# Patient Record
Sex: Male | Born: 1950 | Race: White | Hispanic: No | Marital: Married | Smoking: Never smoker
Health system: Southern US, Community
[De-identification: ages and names within clinical notes are randomized; demographics above are authoritative.]

## PROBLEM LIST (undated history)

## (undated) DIAGNOSIS — C801 Malignant (primary) neoplasm, unspecified: Secondary | ICD-10-CM

## (undated) DIAGNOSIS — R0600 Dyspnea, unspecified: Secondary | ICD-10-CM

## (undated) DIAGNOSIS — J45909 Unspecified asthma, uncomplicated: Secondary | ICD-10-CM

## (undated) DIAGNOSIS — I10 Essential (primary) hypertension: Secondary | ICD-10-CM

## (undated) DIAGNOSIS — R490 Dysphonia: Secondary | ICD-10-CM

## (undated) DIAGNOSIS — J189 Pneumonia, unspecified organism: Secondary | ICD-10-CM

## (undated) DIAGNOSIS — E785 Hyperlipidemia, unspecified: Secondary | ICD-10-CM

## (undated) DIAGNOSIS — I2510 Atherosclerotic heart disease of native coronary artery without angina pectoris: Secondary | ICD-10-CM

## (undated) DIAGNOSIS — K219 Gastro-esophageal reflux disease without esophagitis: Secondary | ICD-10-CM

## (undated) HISTORY — PX: CATARACT EXTRACTION: SUR2

## (undated) HISTORY — DX: Hyperlipidemia, unspecified: E78.5

## (undated) HISTORY — PX: UPPER GASTROINTESTINAL ENDOSCOPY: SHX188

## (undated) HISTORY — PX: PROSTATE BIOPSY: SHX241

## (undated) HISTORY — DX: Essential (primary) hypertension: I10

## (undated) HISTORY — DX: Unspecified asthma, uncomplicated: J45.909

---

## 2007-06-16 ENCOUNTER — Encounter: Admission: RE | Admit: 2007-06-16 | Discharge: 2007-06-16 | Payer: Self-pay | Admitting: Family Medicine

## 2007-06-27 ENCOUNTER — Encounter: Admission: RE | Admit: 2007-06-27 | Discharge: 2007-06-27 | Payer: Self-pay | Admitting: Family Medicine

## 2012-08-16 ENCOUNTER — Other Ambulatory Visit: Payer: Self-pay | Admitting: Family Medicine

## 2012-08-16 ENCOUNTER — Ambulatory Visit
Admission: RE | Admit: 2012-08-16 | Discharge: 2012-08-16 | Disposition: A | Discharge status: Home / Self Care | Payer: BC Managed Care – PPO | Source: Ambulatory Visit | Attending: Family Medicine | Admitting: Family Medicine

## 2012-08-16 DIAGNOSIS — R609 Edema, unspecified: Secondary | ICD-10-CM

## 2013-07-11 ENCOUNTER — Telehealth: Payer: Self-pay | Admitting: Neurology

## 2013-07-14 NOTE — Telephone Encounter (Signed)
Answered patient's questions about upcoming NCV/EMG.

## 2013-07-17 ENCOUNTER — Ambulatory Visit (INDEPENDENT_AMBULATORY_CARE_PROVIDER_SITE_OTHER): Payer: BC Managed Care – PPO | Admitting: Neurology

## 2013-07-17 ENCOUNTER — Ambulatory Visit (INDEPENDENT_AMBULATORY_CARE_PROVIDER_SITE_OTHER): Payer: Self-pay | Admitting: Radiology

## 2013-07-17 DIAGNOSIS — R209 Unspecified disturbances of skin sensation: Secondary | ICD-10-CM

## 2013-07-17 DIAGNOSIS — Z0289 Encounter for other administrative examinations: Secondary | ICD-10-CM

## 2013-07-17 DIAGNOSIS — G544 Lumbosacral root disorders, not elsewhere classified: Secondary | ICD-10-CM

## 2013-07-17 NOTE — Procedures (Signed)
  HISTORY:  Gilbert Thompson is a 62 year old gentleman with a one-year history of bilateral lower extremity stiffness and a gait disorder. The patient has some weakness of the legs, without problems controlling the bowels or the bladder. The patient is being evaluated for the progressive change in his walking.  NERVE CONDUCTION STUDIES:  Nerve conduction studies were performed on the left upper extremity. The distal motor latencies and motor amplitudes for the median and ulnar nerves were within normal limits. The F wave latencies and nerve conduction velocities for these nerves were also normal. The sensory latencies for the median and ulnar nerves were normal.  Nerve conduction studies were performed on both lower extremities. The distal motor latencies and motor amplitudes for the peroneal and posterior tibial nerves were within normal limits. The nerve conduction velocities for these nerves were also normal. The H reflex latencies were prolonged bilaterally. The sensory latencies for the peroneal nerves were within normal limits.   EMG STUDIES:  EMG study was performed on the right lower extremity:  The tibialis anterior muscle reveals 2 to 5K motor units with decreased recruitment. No fibrillations or positive waves were seen. The peroneus tertius muscle reveals 4 to 10K motor units with decreased recruitment. No fibrillations or positive waves were seen. The medial gastrocnemius muscle reveals 1 to 3K motor units with decreased recruitment. One plus fibrillations and positive waves were seen. The vastus lateralis muscle reveals 2 to 4K motor units with full recruitment. No fibrillations or positive waves were seen. The iliopsoas muscle reveals 2 to 4K motor units with full recruitment. No fibrillations or positive waves were seen. The biceps femoris muscle (long head) reveals 2 to 4K motor units with full recruitment. No fibrillations or positive waves were seen. The lumbosacral paraspinal  muscles were tested at 3 levels, and revealed no abnormalities of insertional activity at all 3 levels tested. There was good relaxation.  EMG study was performed on the left lower extremity:  The tibialis anterior muscle reveals 2 to 6K motor units with decreased recruitment. No fibrillations or positive waves were seen. The peroneus tertius muscle reveals 2 to 7K motor units with decreased recruitment. No fibrillations or positive waves were seen. The medial gastrocnemius muscle reveals 2 to 4K motor units with decreased recruitment. One plus fibrillations and positive waves were seen. The vastus lateralis muscle reveals 2 to 4K motor units with full recruitment. No fibrillations or positive waves were seen. The iliopsoas muscle reveals 2 to 4K motor units with full recruitment. No fibrillations or positive waves were seen. The biceps femoris muscle (long head) reveals 2 to 4K motor units with full recruitment. No fibrillations or positive waves were seen. The lumbosacral paraspinal muscles were tested at 3 levels, and revealed no abnormalities of insertional activity at all 3 levels tested. There was good relaxation.   IMPRESSION:  Nerve conduction studies done on the left upper extremity and on both lower extremities were unremarkable. No evidence of a peripheral neuropathy is seen. The H reflex latencies were prolonged bilaterally. 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 Clinical correlation is required. Prolongation of the H reflex latencies would be consistent with S1 radiculopathies as well. The possibility of a length dependent primarily motor neuropathy should also be considered.  Marlan Palau MD 07/17/2013 2:05 PM  [facility] [address] [place], [place] [address]  Phone [phone] Fax [phone]

## 2013-07-19 ENCOUNTER — Other Ambulatory Visit: Payer: Self-pay | Admitting: Family Medicine

## 2013-07-19 DIAGNOSIS — M5416 Radiculopathy, lumbar region: Secondary | ICD-10-CM

## 2013-07-22 ENCOUNTER — Other Ambulatory Visit: Payer: BC Managed Care – PPO

## 2013-07-26 ENCOUNTER — Ambulatory Visit
Admission: RE | Admit: 2013-07-26 | Discharge: 2013-07-26 | Disposition: A | Discharge status: Home / Self Care | Payer: BC Managed Care – PPO | Source: Ambulatory Visit | Attending: Family Medicine | Admitting: Family Medicine

## 2013-07-26 DIAGNOSIS — M5416 Radiculopathy, lumbar region: Secondary | ICD-10-CM

## 2013-08-07 ENCOUNTER — Other Ambulatory Visit: Payer: Self-pay | Admitting: Family Medicine

## 2013-08-07 DIAGNOSIS — M5412 Radiculopathy, cervical region: Secondary | ICD-10-CM

## 2013-08-07 DIAGNOSIS — M25512 Pain in left shoulder: Secondary | ICD-10-CM

## 2013-08-15 ENCOUNTER — Other Ambulatory Visit: Payer: Self-pay | Admitting: Family Medicine

## 2013-08-15 ENCOUNTER — Other Ambulatory Visit: Payer: BC Managed Care – PPO

## 2013-08-15 ENCOUNTER — Ambulatory Visit
Admission: RE | Admit: 2013-08-15 | Discharge: 2013-08-15 | Disposition: A | Discharge status: Home / Self Care | Payer: BC Managed Care – PPO | Source: Ambulatory Visit | Attending: Family Medicine | Admitting: Family Medicine

## 2013-08-15 DIAGNOSIS — M25512 Pain in left shoulder: Secondary | ICD-10-CM

## 2013-08-21 ENCOUNTER — Ambulatory Visit
Admission: RE | Admit: 2013-08-21 | Discharge: 2013-08-21 | Disposition: A | Discharge status: Home / Self Care | Payer: BC Managed Care – PPO | Source: Ambulatory Visit | Attending: Family Medicine | Admitting: Family Medicine

## 2013-08-21 DIAGNOSIS — M5412 Radiculopathy, cervical region: Secondary | ICD-10-CM

## 2013-08-21 DIAGNOSIS — M25512 Pain in left shoulder: Secondary | ICD-10-CM

## 2014-05-07 ENCOUNTER — Other Ambulatory Visit: Payer: Self-pay | Admitting: Family Medicine

## 2014-05-07 DIAGNOSIS — M5412 Radiculopathy, cervical region: Secondary | ICD-10-CM

## 2014-05-07 DIAGNOSIS — M5416 Radiculopathy, lumbar region: Secondary | ICD-10-CM

## 2014-05-09 ENCOUNTER — Ambulatory Visit
Admission: RE | Admit: 2014-05-09 | Discharge: 2014-05-09 | Disposition: A | Discharge status: Home / Self Care | Payer: BC Managed Care – PPO | Source: Ambulatory Visit | Attending: Family Medicine | Admitting: Family Medicine

## 2014-05-09 DIAGNOSIS — M5416 Radiculopathy, lumbar region: Secondary | ICD-10-CM

## 2014-05-09 DIAGNOSIS — M5412 Radiculopathy, cervical region: Secondary | ICD-10-CM

## 2014-05-09 MED ORDER — IOHEXOL 180 MG/ML  SOLN
1.0000 mL | Freq: Once | INTRAMUSCULAR | Status: AC | PRN
  Administered 2014-05-09: 1 mL via EPIDURAL

## 2014-05-09 MED ORDER — METHYLPREDNISOLONE ACETATE 40 MG/ML INJ SUSP (RADIOLOG
120.0000 mg | Freq: Once | INTRAMUSCULAR | Status: AC
  Administered 2014-05-09: 120 mg via EPIDURAL

## 2014-05-09 NOTE — Discharge Instructions (Signed)

## 2014-05-15 ENCOUNTER — Other Ambulatory Visit: Payer: Self-pay | Admitting: Family Medicine

## 2014-05-15 DIAGNOSIS — M542 Cervicalgia: Secondary | ICD-10-CM

## 2014-05-23 ENCOUNTER — Other Ambulatory Visit: Payer: Self-pay | Admitting: Family Medicine

## 2014-05-23 DIAGNOSIS — M542 Cervicalgia: Secondary | ICD-10-CM

## 2014-05-25 ENCOUNTER — Ambulatory Visit
Admission: RE | Admit: 2014-05-25 | Discharge: 2014-05-25 | Disposition: A | Discharge status: Home / Self Care | Payer: BC Managed Care – PPO | Source: Ambulatory Visit | Attending: Family Medicine | Admitting: Family Medicine

## 2014-05-25 DIAGNOSIS — M542 Cervicalgia: Secondary | ICD-10-CM

## 2014-05-25 MED ORDER — TRIAMCINOLONE ACETONIDE 40 MG/ML IJ SUSP (RADIOLOGY)
60.0000 mg | Freq: Once | INTRAMUSCULAR | Status: AC
  Administered 2014-05-25: 60 mg via EPIDURAL

## 2014-05-25 MED ORDER — IOHEXOL 300 MG/ML  SOLN
1.0000 mL | Freq: Once | INTRAMUSCULAR | Status: AC | PRN
  Administered 2014-05-25: 1 mL via EPIDURAL

## 2014-06-15 ENCOUNTER — Ambulatory Visit
Admission: RE | Admit: 2014-06-15 | Discharge: 2014-06-15 | Disposition: A | Discharge status: Home / Self Care | Payer: BC Managed Care – PPO | Source: Ambulatory Visit | Attending: Family Medicine | Admitting: Family Medicine

## 2014-06-15 DIAGNOSIS — M542 Cervicalgia: Secondary | ICD-10-CM

## 2014-06-15 MED ORDER — IOHEXOL 300 MG/ML  SOLN
1.0000 mL | Freq: Once | INTRAMUSCULAR | Status: AC | PRN
  Administered 2014-06-15: 1 mL via EPIDURAL

## 2014-06-15 MED ORDER — TRIAMCINOLONE ACETONIDE 40 MG/ML IJ SUSP (RADIOLOGY)
60.0000 mg | Freq: Once | INTRAMUSCULAR | Status: AC
  Administered 2014-06-15: 60 mg via EPIDURAL

## 2014-08-08 ENCOUNTER — Ambulatory Visit
Admission: RE | Admit: 2014-08-08 | Discharge: 2014-08-08 | Disposition: A | Discharge status: Home / Self Care | Payer: BC Managed Care – PPO | Source: Ambulatory Visit | Attending: Family Medicine | Admitting: Family Medicine

## 2014-08-08 ENCOUNTER — Other Ambulatory Visit: Payer: Self-pay | Admitting: Family Medicine

## 2014-08-08 DIAGNOSIS — R059 Cough, unspecified: Secondary | ICD-10-CM

## 2014-08-08 DIAGNOSIS — R05 Cough: Secondary | ICD-10-CM

## 2014-10-18 ENCOUNTER — Other Ambulatory Visit: Payer: Self-pay | Admitting: Family Medicine

## 2014-10-18 DIAGNOSIS — M5416 Radiculopathy, lumbar region: Secondary | ICD-10-CM

## 2014-10-30 ENCOUNTER — Other Ambulatory Visit: Payer: Self-pay | Admitting: Family Medicine

## 2014-10-30 ENCOUNTER — Ambulatory Visit
Admission: RE | Admit: 2014-10-30 | Discharge: 2014-10-30 | Disposition: A | Discharge status: Home / Self Care | Payer: Medicare Other | Source: Ambulatory Visit | Attending: Family Medicine | Admitting: Family Medicine

## 2014-10-30 DIAGNOSIS — M5416 Radiculopathy, lumbar region: Secondary | ICD-10-CM

## 2014-10-30 MED ORDER — METHYLPREDNISOLONE ACETATE 40 MG/ML INJ SUSP (RADIOLOG: 120.0000 mg | Freq: Once | INTRAMUSCULAR | Status: DC

## 2014-10-30 MED ORDER — IOHEXOL 180 MG/ML  SOLN: 1.0000 mL | Freq: Once | INTRAMUSCULAR | Status: AC | PRN

## 2014-11-23 ENCOUNTER — Other Ambulatory Visit: Payer: Self-pay | Admitting: Family Medicine

## 2014-11-23 DIAGNOSIS — M79604 Pain in right leg: Secondary | ICD-10-CM

## 2014-11-23 DIAGNOSIS — M5416 Radiculopathy, lumbar region: Secondary | ICD-10-CM

## 2014-12-04 ENCOUNTER — Ambulatory Visit
Admission: RE | Admit: 2014-12-04 | Discharge: 2014-12-04 | Disposition: A | Discharge status: Home / Self Care | Payer: Medicare Other | Source: Ambulatory Visit | Attending: Family Medicine | Admitting: Family Medicine

## 2014-12-04 DIAGNOSIS — M5416 Radiculopathy, lumbar region: Secondary | ICD-10-CM

## 2014-12-04 DIAGNOSIS — M79604 Pain in right leg: Secondary | ICD-10-CM

## 2014-12-04 MED ORDER — METHYLPREDNISOLONE ACETATE 40 MG/ML INJ SUSP (RADIOLOG
120.0000 mg | Freq: Once | INTRAMUSCULAR | Status: AC
  Administered 2014-12-04: 120 mg via EPIDURAL

## 2014-12-04 MED ORDER — IOHEXOL 180 MG/ML  SOLN
1.0000 mL | Freq: Once | INTRAMUSCULAR | Status: AC | PRN
  Administered 2014-12-04: 1 mL via EPIDURAL

## 2015-03-04 ENCOUNTER — Other Ambulatory Visit: Payer: Self-pay | Admitting: Family Medicine

## 2015-03-04 DIAGNOSIS — M5412 Radiculopathy, cervical region: Secondary | ICD-10-CM

## 2015-03-13 ENCOUNTER — Ambulatory Visit
Admission: RE | Admit: 2015-03-13 | Discharge: 2015-03-13 | Disposition: A | Discharge status: Home / Self Care | Payer: Medicare Other | Source: Ambulatory Visit | Attending: Family Medicine | Admitting: Family Medicine

## 2015-03-13 DIAGNOSIS — M5412 Radiculopathy, cervical region: Secondary | ICD-10-CM

## 2015-03-13 MED ORDER — IOHEXOL 300 MG/ML  SOLN
1.0000 mL | Freq: Once | INTRAMUSCULAR | Status: AC | PRN
  Administered 2015-03-13: 1 mL via EPIDURAL

## 2015-03-13 MED ORDER — TRIAMCINOLONE ACETONIDE 40 MG/ML IJ SUSP (RADIOLOGY)
60.0000 mg | Freq: Once | INTRAMUSCULAR | Status: AC
  Administered 2015-03-13: 60 mg via EPIDURAL

## 2015-05-02 ENCOUNTER — Other Ambulatory Visit: Payer: Self-pay | Admitting: Family Medicine

## 2015-05-02 DIAGNOSIS — M5416 Radiculopathy, lumbar region: Secondary | ICD-10-CM

## 2015-05-10 ENCOUNTER — Ambulatory Visit
Admission: RE | Admit: 2015-05-10 | Discharge: 2015-05-10 | Disposition: A | Discharge status: Home / Self Care | Payer: Medicare Other | Source: Ambulatory Visit | Attending: Family Medicine | Admitting: Family Medicine

## 2015-05-10 DIAGNOSIS — M5416 Radiculopathy, lumbar region: Secondary | ICD-10-CM

## 2015-05-10 MED ORDER — IOHEXOL 180 MG/ML  SOLN
1.0000 mL | Freq: Once | INTRAMUSCULAR | Status: DC | PRN
  Administered 2015-05-10: 1 mL via EPIDURAL

## 2015-05-10 MED ORDER — METHYLPREDNISOLONE ACETATE 40 MG/ML INJ SUSP (RADIOLOG
120.0000 mg | Freq: Once | INTRAMUSCULAR | Status: AC
  Administered 2015-05-10: 120 mg via EPIDURAL

## 2015-10-17 ENCOUNTER — Other Ambulatory Visit: Payer: Self-pay | Admitting: Family Medicine

## 2015-10-17 DIAGNOSIS — M5416 Radiculopathy, lumbar region: Secondary | ICD-10-CM

## 2015-11-01 ENCOUNTER — Ambulatory Visit
Admission: RE | Admit: 2015-11-01 | Discharge: 2015-11-01 | Disposition: A | Discharge status: Home / Self Care | Payer: Medicare Other | Source: Ambulatory Visit | Attending: Family Medicine | Admitting: Family Medicine

## 2015-11-01 DIAGNOSIS — M5416 Radiculopathy, lumbar region: Secondary | ICD-10-CM

## 2015-11-01 MED ORDER — METHYLPREDNISOLONE ACETATE 40 MG/ML INJ SUSP (RADIOLOG
120.0000 mg | Freq: Once | INTRAMUSCULAR | Status: AC
  Administered 2015-11-01: 120 mg via EPIDURAL

## 2015-11-01 MED ORDER — IOHEXOL 180 MG/ML  SOLN
1.0000 mL | Freq: Once | INTRAMUSCULAR | Status: AC | PRN
  Administered 2015-11-01: 1 mL via EPIDURAL

## 2015-11-07 ENCOUNTER — Other Ambulatory Visit: Payer: Self-pay | Admitting: Family Medicine

## 2015-11-07 DIAGNOSIS — M5412 Radiculopathy, cervical region: Secondary | ICD-10-CM

## 2015-11-20 ENCOUNTER — Ambulatory Visit
Admission: RE | Admit: 2015-11-20 | Discharge: 2015-11-20 | Disposition: A | Discharge status: Home / Self Care | Payer: Medicare Other | Source: Ambulatory Visit | Attending: Family Medicine | Admitting: Family Medicine

## 2015-11-20 DIAGNOSIS — M5412 Radiculopathy, cervical region: Secondary | ICD-10-CM

## 2015-11-20 MED ORDER — TRIAMCINOLONE ACETONIDE 40 MG/ML IJ SUSP (RADIOLOGY)
60.0000 mg | Freq: Once | INTRAMUSCULAR | Status: AC
  Administered 2015-11-20: 60 mg via EPIDURAL

## 2015-11-20 MED ORDER — IOHEXOL 300 MG/ML  SOLN
1.0000 mL | Freq: Once | INTRAMUSCULAR | Status: AC | PRN
  Administered 2015-11-20: 1 mL via EPIDURAL

## 2016-03-02 ENCOUNTER — Other Ambulatory Visit: Payer: Self-pay | Admitting: Family Medicine

## 2016-03-02 DIAGNOSIS — M5412 Radiculopathy, cervical region: Secondary | ICD-10-CM

## 2016-03-10 ENCOUNTER — Ambulatory Visit
Admission: RE | Admit: 2016-03-10 | Discharge: 2016-03-10 | Disposition: A | Discharge status: Home / Self Care | Payer: Medicare Other | Source: Ambulatory Visit | Attending: Family Medicine | Admitting: Family Medicine

## 2016-03-10 DIAGNOSIS — M5412 Radiculopathy, cervical region: Secondary | ICD-10-CM

## 2016-03-10 MED ORDER — TRIAMCINOLONE ACETONIDE 40 MG/ML IJ SUSP (RADIOLOGY)
60.0000 mg | Freq: Once | INTRAMUSCULAR | Status: AC
  Administered 2016-03-10: 60 mg via EPIDURAL

## 2016-03-10 MED ORDER — IOPAMIDOL (ISOVUE-M 300) INJECTION 61%
15.0000 mL | Freq: Once | INTRAMUSCULAR | Status: AC | PRN
  Administered 2016-03-10: 15 mL via INTRATHECAL

## 2016-06-22 ENCOUNTER — Other Ambulatory Visit: Payer: Self-pay | Admitting: Family Medicine

## 2016-06-22 DIAGNOSIS — M5412 Radiculopathy, cervical region: Secondary | ICD-10-CM

## 2016-07-01 ENCOUNTER — Ambulatory Visit
Admission: RE | Admit: 2016-07-01 | Discharge: 2016-07-01 | Disposition: A | Discharge status: Home / Self Care | Payer: Medicare Other | Source: Ambulatory Visit | Attending: Family Medicine | Admitting: Family Medicine

## 2016-07-01 DIAGNOSIS — M5412 Radiculopathy, cervical region: Secondary | ICD-10-CM

## 2016-07-01 MED ORDER — TRIAMCINOLONE ACETONIDE 40 MG/ML IJ SUSP (RADIOLOGY)
60.0000 mg | Freq: Once | INTRAMUSCULAR | Status: AC
  Administered 2016-07-01: 60 mg via EPIDURAL

## 2016-07-01 MED ORDER — IOPAMIDOL (ISOVUE-M 300) INJECTION 61%
1.0000 mL | Freq: Once | INTRAMUSCULAR | Status: AC | PRN
  Administered 2016-07-01: 1 mL via EPIDURAL

## 2016-12-02 ENCOUNTER — Other Ambulatory Visit: Payer: Self-pay | Admitting: Family Medicine

## 2016-12-02 DIAGNOSIS — M5416 Radiculopathy, lumbar region: Secondary | ICD-10-CM

## 2016-12-10 DIAGNOSIS — L570 Actinic keratosis: Secondary | ICD-10-CM | POA: Diagnosis not present

## 2016-12-10 DIAGNOSIS — Z85828 Personal history of other malignant neoplasm of skin: Secondary | ICD-10-CM | POA: Diagnosis not present

## 2016-12-10 DIAGNOSIS — L821 Other seborrheic keratosis: Secondary | ICD-10-CM | POA: Diagnosis not present

## 2016-12-10 DIAGNOSIS — D1801 Hemangioma of skin and subcutaneous tissue: Secondary | ICD-10-CM | POA: Diagnosis not present

## 2016-12-10 DIAGNOSIS — L814 Other melanin hyperpigmentation: Secondary | ICD-10-CM | POA: Diagnosis not present

## 2016-12-16 ENCOUNTER — Ambulatory Visit
Admission: RE | Admit: 2016-12-16 | Discharge: 2016-12-16 | Disposition: A | Discharge status: Home / Self Care | Payer: Medicare HMO | Source: Ambulatory Visit | Attending: Family Medicine | Admitting: Family Medicine

## 2016-12-16 DIAGNOSIS — M5416 Radiculopathy, lumbar region: Secondary | ICD-10-CM

## 2016-12-16 DIAGNOSIS — M47816 Spondylosis without myelopathy or radiculopathy, lumbar region: Secondary | ICD-10-CM | POA: Diagnosis not present

## 2016-12-16 MED ORDER — IOPAMIDOL (ISOVUE-M 200) INJECTION 41%
1.0000 mL | Freq: Once | INTRAMUSCULAR | Status: AC
  Administered 2016-12-16: 1 mL via EPIDURAL

## 2016-12-16 MED ORDER — METHYLPREDNISOLONE ACETATE 40 MG/ML INJ SUSP (RADIOLOG
120.0000 mg | Freq: Once | INTRAMUSCULAR | Status: AC
Start: 2016-12-16 — End: 2016-12-16
  Administered 2016-12-16: 120 mg via EPIDURAL

## 2016-12-16 NOTE — Discharge Instructions (Signed)

## 2017-01-05 DIAGNOSIS — L820 Inflamed seborrheic keratosis: Secondary | ICD-10-CM | POA: Diagnosis not present

## 2017-02-22 ENCOUNTER — Other Ambulatory Visit: Payer: Self-pay | Admitting: Family Medicine

## 2017-02-22 DIAGNOSIS — M5412 Radiculopathy, cervical region: Secondary | ICD-10-CM

## 2017-02-24 DIAGNOSIS — R69 Illness, unspecified: Secondary | ICD-10-CM | POA: Diagnosis not present

## 2017-03-02 ENCOUNTER — Inpatient Hospital Stay
Admission: RE | Admit: 2017-03-02 | Discharge: 2017-03-02 | Disposition: A | Discharge status: Home / Self Care | Payer: Medicare HMO | Source: Ambulatory Visit | Attending: Family Medicine | Admitting: Family Medicine

## 2017-03-05 ENCOUNTER — Ambulatory Visit
Admission: RE | Admit: 2017-03-05 | Discharge: 2017-03-05 | Disposition: A | Discharge status: Home / Self Care | Payer: Medicare HMO | Source: Ambulatory Visit | Attending: Family Medicine | Admitting: Family Medicine

## 2017-03-05 DIAGNOSIS — M47812 Spondylosis without myelopathy or radiculopathy, cervical region: Secondary | ICD-10-CM | POA: Diagnosis not present

## 2017-03-05 DIAGNOSIS — M5412 Radiculopathy, cervical region: Secondary | ICD-10-CM

## 2017-03-05 MED ORDER — TRIAMCINOLONE ACETONIDE 40 MG/ML IJ SUSP (RADIOLOGY): 60.0000 mg | Freq: Once | INTRAMUSCULAR | Status: DC

## 2017-03-05 MED ORDER — IOPAMIDOL (ISOVUE-M 300) INJECTION 61%: 1.0000 mL | Freq: Once | INTRAMUSCULAR | Status: DC | PRN

## 2017-07-19 DIAGNOSIS — Z23 Encounter for immunization: Secondary | ICD-10-CM | POA: Diagnosis not present

## 2017-08-20 ENCOUNTER — Other Ambulatory Visit: Payer: Self-pay | Admitting: Family Medicine

## 2017-08-20 DIAGNOSIS — M5416 Radiculopathy, lumbar region: Secondary | ICD-10-CM

## 2017-08-31 ENCOUNTER — Inpatient Hospital Stay
Admission: RE | Admit: 2017-08-31 | Discharge: 2017-08-31 | Disposition: A | Discharge status: Home / Self Care | Payer: Medicare HMO | Source: Ambulatory Visit | Attending: Family Medicine | Admitting: Family Medicine

## 2017-09-13 ENCOUNTER — Ambulatory Visit
Admission: RE | Admit: 2017-09-13 | Discharge: 2017-09-13 | Disposition: A | Discharge status: Home / Self Care | Payer: Medicare HMO | Source: Ambulatory Visit | Attending: Family Medicine | Admitting: Family Medicine

## 2017-09-13 DIAGNOSIS — M5416 Radiculopathy, lumbar region: Secondary | ICD-10-CM

## 2017-09-13 DIAGNOSIS — M47817 Spondylosis without myelopathy or radiculopathy, lumbosacral region: Secondary | ICD-10-CM | POA: Diagnosis not present

## 2017-09-13 MED ORDER — IOPAMIDOL (ISOVUE-M 200) INJECTION 41%
1.0000 mL | Freq: Once | INTRAMUSCULAR | Status: AC
  Administered 2017-09-13: 1 mL via EPIDURAL

## 2017-09-13 MED ORDER — METHYLPREDNISOLONE ACETATE 40 MG/ML INJ SUSP (RADIOLOG
120.0000 mg | Freq: Once | INTRAMUSCULAR | Status: AC
Start: 2017-09-13 — End: 2017-09-13
  Administered 2017-09-13: 120 mg via EPIDURAL

## 2017-09-30 DIAGNOSIS — R69 Illness, unspecified: Secondary | ICD-10-CM | POA: Diagnosis not present

## 2017-10-05 ENCOUNTER — Other Ambulatory Visit: Payer: Self-pay | Admitting: Family Medicine

## 2017-10-05 DIAGNOSIS — M5412 Radiculopathy, cervical region: Secondary | ICD-10-CM

## 2017-10-08 DIAGNOSIS — Z125 Encounter for screening for malignant neoplasm of prostate: Secondary | ICD-10-CM | POA: Diagnosis not present

## 2017-10-08 DIAGNOSIS — Z1159 Encounter for screening for other viral diseases: Secondary | ICD-10-CM | POA: Diagnosis not present

## 2017-10-15 DIAGNOSIS — Z0001 Encounter for general adult medical examination with abnormal findings: Secondary | ICD-10-CM | POA: Diagnosis not present

## 2017-10-15 DIAGNOSIS — E785 Hyperlipidemia, unspecified: Secondary | ICD-10-CM | POA: Diagnosis not present

## 2017-10-15 DIAGNOSIS — J45909 Unspecified asthma, uncomplicated: Secondary | ICD-10-CM | POA: Diagnosis not present

## 2017-10-15 DIAGNOSIS — I10 Essential (primary) hypertension: Secondary | ICD-10-CM | POA: Diagnosis not present

## 2017-10-19 ENCOUNTER — Ambulatory Visit
Admission: RE | Admit: 2017-10-19 | Discharge: 2017-10-19 | Disposition: A | Discharge status: Home / Self Care | Payer: Medicare HMO | Source: Ambulatory Visit | Attending: Family Medicine | Admitting: Family Medicine

## 2017-10-19 DIAGNOSIS — M47812 Spondylosis without myelopathy or radiculopathy, cervical region: Secondary | ICD-10-CM | POA: Diagnosis not present

## 2017-10-19 DIAGNOSIS — M5412 Radiculopathy, cervical region: Secondary | ICD-10-CM

## 2017-10-19 MED ORDER — TRIAMCINOLONE ACETONIDE 40 MG/ML IJ SUSP (RADIOLOGY)
60.0000 mg | Freq: Once | INTRAMUSCULAR | Status: AC
  Administered 2017-10-19: 60 mg via EPIDURAL

## 2017-10-19 MED ORDER — IOPAMIDOL (ISOVUE-M 300) INJECTION 61%
1.0000 mL | Freq: Once | INTRAMUSCULAR | Status: AC | PRN
  Administered 2017-10-19: 1 mL via EPIDURAL

## 2017-12-06 DIAGNOSIS — K648 Other hemorrhoids: Secondary | ICD-10-CM | POA: Diagnosis not present

## 2017-12-06 DIAGNOSIS — D122 Benign neoplasm of ascending colon: Secondary | ICD-10-CM | POA: Diagnosis not present

## 2017-12-06 DIAGNOSIS — D125 Benign neoplasm of sigmoid colon: Secondary | ICD-10-CM | POA: Diagnosis not present

## 2017-12-06 DIAGNOSIS — D124 Benign neoplasm of descending colon: Secondary | ICD-10-CM | POA: Diagnosis not present

## 2017-12-06 DIAGNOSIS — Z1211 Encounter for screening for malignant neoplasm of colon: Secondary | ICD-10-CM | POA: Diagnosis not present

## 2017-12-06 DIAGNOSIS — K635 Polyp of colon: Secondary | ICD-10-CM | POA: Diagnosis not present

## 2017-12-07 DIAGNOSIS — D124 Benign neoplasm of descending colon: Secondary | ICD-10-CM | POA: Diagnosis not present

## 2017-12-07 DIAGNOSIS — D122 Benign neoplasm of ascending colon: Secondary | ICD-10-CM | POA: Diagnosis not present

## 2017-12-07 DIAGNOSIS — D126 Benign neoplasm of colon, unspecified: Secondary | ICD-10-CM | POA: Diagnosis not present

## 2018-01-10 DIAGNOSIS — E785 Hyperlipidemia, unspecified: Secondary | ICD-10-CM | POA: Diagnosis not present

## 2018-03-15 DIAGNOSIS — R69 Illness, unspecified: Secondary | ICD-10-CM | POA: Diagnosis not present

## 2018-03-16 DIAGNOSIS — R69 Illness, unspecified: Secondary | ICD-10-CM | POA: Diagnosis not present

## 2018-04-13 DIAGNOSIS — R69 Illness, unspecified: Secondary | ICD-10-CM | POA: Diagnosis not present

## 2018-06-08 DIAGNOSIS — R69 Illness, unspecified: Secondary | ICD-10-CM | POA: Diagnosis not present

## 2018-08-03 DIAGNOSIS — H25042 Posterior subcapsular polar age-related cataract, left eye: Secondary | ICD-10-CM | POA: Diagnosis not present

## 2018-08-17 DIAGNOSIS — H25013 Cortical age-related cataract, bilateral: Secondary | ICD-10-CM | POA: Diagnosis not present

## 2018-08-17 DIAGNOSIS — H524 Presbyopia: Secondary | ICD-10-CM | POA: Diagnosis not present

## 2018-08-17 DIAGNOSIS — H2513 Age-related nuclear cataract, bilateral: Secondary | ICD-10-CM | POA: Diagnosis not present

## 2018-08-17 DIAGNOSIS — H25043 Posterior subcapsular polar age-related cataract, bilateral: Secondary | ICD-10-CM | POA: Diagnosis not present

## 2018-10-05 DIAGNOSIS — D2262 Melanocytic nevi of left upper limb, including shoulder: Secondary | ICD-10-CM | POA: Diagnosis not present

## 2018-10-05 DIAGNOSIS — I8391 Asymptomatic varicose veins of right lower extremity: Secondary | ICD-10-CM | POA: Diagnosis not present

## 2018-10-05 DIAGNOSIS — D225 Melanocytic nevi of trunk: Secondary | ICD-10-CM | POA: Diagnosis not present

## 2018-10-05 DIAGNOSIS — L218 Other seborrheic dermatitis: Secondary | ICD-10-CM | POA: Diagnosis not present

## 2018-10-05 DIAGNOSIS — L821 Other seborrheic keratosis: Secondary | ICD-10-CM | POA: Diagnosis not present

## 2018-10-05 DIAGNOSIS — L570 Actinic keratosis: Secondary | ICD-10-CM | POA: Diagnosis not present

## 2018-10-05 DIAGNOSIS — D692 Other nonthrombocytopenic purpura: Secondary | ICD-10-CM | POA: Diagnosis not present

## 2018-10-05 DIAGNOSIS — D2261 Melanocytic nevi of right upper limb, including shoulder: Secondary | ICD-10-CM | POA: Diagnosis not present

## 2018-10-07 ENCOUNTER — Other Ambulatory Visit: Payer: Self-pay | Admitting: Family Medicine

## 2018-10-07 ENCOUNTER — Ambulatory Visit
Admission: RE | Admit: 2018-10-07 | Discharge: 2018-10-07 | Disposition: A | Discharge status: Home / Self Care | Payer: Medicare HMO | Source: Ambulatory Visit | Attending: Family Medicine | Admitting: Family Medicine

## 2018-10-07 DIAGNOSIS — S43004A Unspecified dislocation of right shoulder joint, initial encounter: Secondary | ICD-10-CM | POA: Diagnosis not present

## 2018-10-07 DIAGNOSIS — W19XXXA Unspecified fall, initial encounter: Secondary | ICD-10-CM

## 2018-10-11 DIAGNOSIS — S43004A Unspecified dislocation of right shoulder joint, initial encounter: Secondary | ICD-10-CM | POA: Diagnosis not present

## 2018-10-18 DIAGNOSIS — H25812 Combined forms of age-related cataract, left eye: Secondary | ICD-10-CM | POA: Diagnosis not present

## 2018-10-18 DIAGNOSIS — H25042 Posterior subcapsular polar age-related cataract, left eye: Secondary | ICD-10-CM | POA: Diagnosis not present

## 2018-10-18 DIAGNOSIS — H52202 Unspecified astigmatism, left eye: Secondary | ICD-10-CM | POA: Diagnosis not present

## 2018-10-18 DIAGNOSIS — H25012 Cortical age-related cataract, left eye: Secondary | ICD-10-CM | POA: Diagnosis not present

## 2018-10-18 DIAGNOSIS — H2512 Age-related nuclear cataract, left eye: Secondary | ICD-10-CM | POA: Diagnosis not present

## 2018-10-25 DIAGNOSIS — S43004A Unspecified dislocation of right shoulder joint, initial encounter: Secondary | ICD-10-CM | POA: Diagnosis not present

## 2018-10-26 DIAGNOSIS — R69 Illness, unspecified: Secondary | ICD-10-CM | POA: Diagnosis not present

## 2018-10-26 DIAGNOSIS — S43004A Unspecified dislocation of right shoulder joint, initial encounter: Secondary | ICD-10-CM | POA: Diagnosis not present

## 2018-11-04 DIAGNOSIS — S43001D Unspecified subluxation of right shoulder joint, subsequent encounter: Secondary | ICD-10-CM | POA: Diagnosis not present

## 2018-11-07 DIAGNOSIS — Z125 Encounter for screening for malignant neoplasm of prostate: Secondary | ICD-10-CM | POA: Diagnosis not present

## 2018-11-08 DIAGNOSIS — H25011 Cortical age-related cataract, right eye: Secondary | ICD-10-CM | POA: Diagnosis not present

## 2018-11-08 DIAGNOSIS — H25041 Posterior subcapsular polar age-related cataract, right eye: Secondary | ICD-10-CM | POA: Diagnosis not present

## 2018-11-08 DIAGNOSIS — H2511 Age-related nuclear cataract, right eye: Secondary | ICD-10-CM | POA: Diagnosis not present

## 2018-11-08 DIAGNOSIS — H25811 Combined forms of age-related cataract, right eye: Secondary | ICD-10-CM | POA: Diagnosis not present

## 2018-11-09 DIAGNOSIS — S43001D Unspecified subluxation of right shoulder joint, subsequent encounter: Secondary | ICD-10-CM | POA: Diagnosis not present

## 2018-11-10 DIAGNOSIS — S43001D Unspecified subluxation of right shoulder joint, subsequent encounter: Secondary | ICD-10-CM | POA: Diagnosis not present

## 2018-11-11 ENCOUNTER — Other Ambulatory Visit: Payer: Self-pay | Admitting: Family Medicine

## 2018-11-11 DIAGNOSIS — M5412 Radiculopathy, cervical region: Secondary | ICD-10-CM

## 2018-11-15 DIAGNOSIS — S43001D Unspecified subluxation of right shoulder joint, subsequent encounter: Secondary | ICD-10-CM | POA: Diagnosis not present

## 2018-11-16 DIAGNOSIS — Z23 Encounter for immunization: Secondary | ICD-10-CM | POA: Diagnosis not present

## 2018-11-16 DIAGNOSIS — E7800 Pure hypercholesterolemia, unspecified: Secondary | ICD-10-CM | POA: Diagnosis not present

## 2018-11-16 DIAGNOSIS — M5412 Radiculopathy, cervical region: Secondary | ICD-10-CM | POA: Diagnosis not present

## 2018-11-16 DIAGNOSIS — M5416 Radiculopathy, lumbar region: Secondary | ICD-10-CM | POA: Diagnosis not present

## 2018-11-16 DIAGNOSIS — J4530 Mild persistent asthma, uncomplicated: Secondary | ICD-10-CM | POA: Diagnosis not present

## 2018-11-16 DIAGNOSIS — Z0000 Encounter for general adult medical examination without abnormal findings: Secondary | ICD-10-CM | POA: Diagnosis not present

## 2018-11-23 DIAGNOSIS — S43001D Unspecified subluxation of right shoulder joint, subsequent encounter: Secondary | ICD-10-CM | POA: Diagnosis not present

## 2018-11-28 ENCOUNTER — Ambulatory Visit
Admission: RE | Admit: 2018-11-28 | Discharge: 2018-11-28 | Disposition: A | Discharge status: Home / Self Care | Payer: Medicare HMO | Source: Ambulatory Visit | Attending: Family Medicine | Admitting: Family Medicine

## 2018-11-28 DIAGNOSIS — M47812 Spondylosis without myelopathy or radiculopathy, cervical region: Secondary | ICD-10-CM | POA: Diagnosis not present

## 2018-11-28 DIAGNOSIS — M5412 Radiculopathy, cervical region: Secondary | ICD-10-CM

## 2018-11-28 DIAGNOSIS — S43001D Unspecified subluxation of right shoulder joint, subsequent encounter: Secondary | ICD-10-CM | POA: Diagnosis not present

## 2018-11-28 MED ORDER — TRIAMCINOLONE ACETONIDE 40 MG/ML IJ SUSP (RADIOLOGY)
60.0000 mg | Freq: Once | INTRAMUSCULAR | Status: AC
  Administered 2018-11-28: 60 mg via EPIDURAL

## 2018-11-28 MED ORDER — IOPAMIDOL (ISOVUE-M 300) INJECTION 61%
1.0000 mL | Freq: Once | INTRAMUSCULAR | Status: AC | PRN
  Administered 2018-11-28: 1 mL via EPIDURAL

## 2018-12-05 DIAGNOSIS — M25511 Pain in right shoulder: Secondary | ICD-10-CM | POA: Diagnosis not present

## 2019-01-09 DIAGNOSIS — Z0100 Encounter for examination of eyes and vision without abnormal findings: Secondary | ICD-10-CM | POA: Diagnosis not present

## 2019-01-16 DIAGNOSIS — Z961 Presence of intraocular lens: Secondary | ICD-10-CM | POA: Diagnosis not present

## 2019-05-03 DIAGNOSIS — R69 Illness, unspecified: Secondary | ICD-10-CM | POA: Diagnosis not present

## 2019-05-24 DIAGNOSIS — R69 Illness, unspecified: Secondary | ICD-10-CM | POA: Diagnosis not present

## 2019-05-26 DIAGNOSIS — Z20828 Contact with and (suspected) exposure to other viral communicable diseases: Secondary | ICD-10-CM | POA: Diagnosis not present

## 2019-05-26 DIAGNOSIS — B370 Candidal stomatitis: Secondary | ICD-10-CM | POA: Diagnosis not present

## 2019-05-26 DIAGNOSIS — R69 Illness, unspecified: Secondary | ICD-10-CM | POA: Diagnosis not present

## 2019-05-26 DIAGNOSIS — J019 Acute sinusitis, unspecified: Secondary | ICD-10-CM | POA: Diagnosis not present

## 2019-05-26 DIAGNOSIS — Z03818 Encounter for observation for suspected exposure to other biological agents ruled out: Secondary | ICD-10-CM | POA: Diagnosis not present

## 2019-05-31 DIAGNOSIS — R69 Illness, unspecified: Secondary | ICD-10-CM | POA: Diagnosis not present

## 2019-06-15 IMAGING — XA DG INJECT/[PERSON_NAME] INC NEEDLE/CATH/PLC EPI/CERV/THOR W/IMG
2 series · 2 of 2 positions shown · non-contrast
Comparison: none

CLINICAL DATA: Cervical spondylosis. Recent pain related to a
dislocation of the RIGHT shoulder. Neck symptoms remain leftward
predominant.

[Series 2: ortho standard · 1 of 1 slices shown (1 of 2)]
[im 1/1]
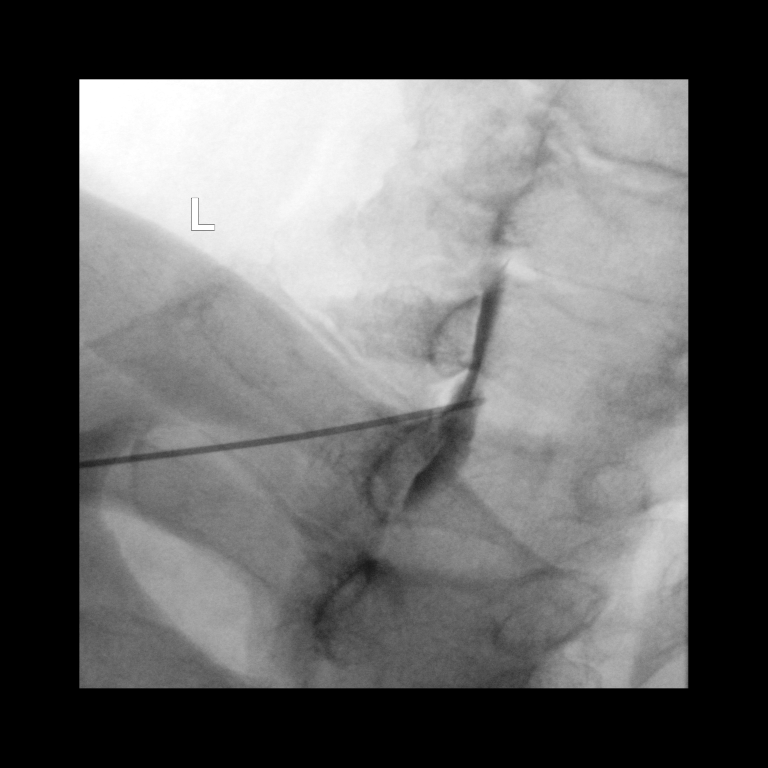

[Series 3: ortho standard · 1 of 1 slices shown (2 of 2)]
[im 1/1]
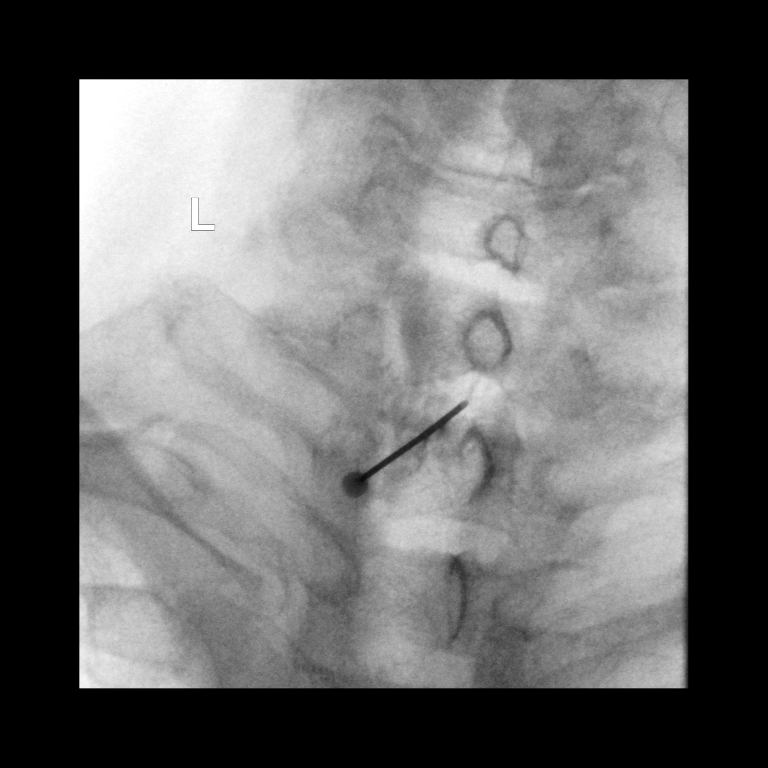

[2 of 2 positions shown; findings below may reference images not displayed]

FLUOROSCOPY TIME:  36 seconds corresponding to a Dose Area Product
of 30.75 Gy*m2

PROCEDURE:
Informed written consent was obtained.  Time-out was performed.

An appropriate skin entry site was chosen, cleansed with Betadine,
and anesthetized with 1% lidocaine.

CERVICAL EPIDURAL INJECTION

An interlaminar approach was performed on the LEFT/midline at C7-T1.
A 20 gauge epidural needle was advanced using loss-of-resistance
technique.

DIAGNOSTIC EPIDURAL INJECTION

Injection of Isovue-M 300 shows a good epidural pattern with spread
above and below the level of needle placement, primarily on the
LEFT. No vascular opacification is seen.

THERAPEUTIC EPIDURAL INJECTION

1.5 ml of Kenalog 40 mixed with 1 ml of 1% Lidocaine and 2 ml of
normal saline were then instilled. The procedure was well-tolerated,
and the patient was discharged thirty minutes following the
injection in good condition.
IMPRESSION: Technically successful first epidural injection on the LEFT/midline
at C7-T1.

## 2019-07-12 DIAGNOSIS — Z23 Encounter for immunization: Secondary | ICD-10-CM | POA: Diagnosis not present

## 2019-08-10 DIAGNOSIS — Z1159 Encounter for screening for other viral diseases: Secondary | ICD-10-CM | POA: Diagnosis not present

## 2019-08-10 DIAGNOSIS — Z20828 Contact with and (suspected) exposure to other viral communicable diseases: Secondary | ICD-10-CM | POA: Diagnosis not present

## 2019-08-10 DIAGNOSIS — Z03818 Encounter for observation for suspected exposure to other biological agents ruled out: Secondary | ICD-10-CM | POA: Diagnosis not present

## 2019-08-21 DIAGNOSIS — Z20828 Contact with and (suspected) exposure to other viral communicable diseases: Secondary | ICD-10-CM | POA: Diagnosis not present

## 2019-08-21 DIAGNOSIS — Z03818 Encounter for observation for suspected exposure to other biological agents ruled out: Secondary | ICD-10-CM | POA: Diagnosis not present

## 2019-08-21 DIAGNOSIS — Z1159 Encounter for screening for other viral diseases: Secondary | ICD-10-CM | POA: Diagnosis not present

## 2020-01-08 ENCOUNTER — Other Ambulatory Visit: Payer: Self-pay

## 2020-01-08 ENCOUNTER — Encounter (INDEPENDENT_AMBULATORY_CARE_PROVIDER_SITE_OTHER): Payer: Self-pay | Admitting: Otolaryngology

## 2020-01-08 ENCOUNTER — Ambulatory Visit (INDEPENDENT_AMBULATORY_CARE_PROVIDER_SITE_OTHER): Payer: Medicare HMO | Admitting: Otolaryngology

## 2020-01-08 VITALS — Temp 97.5°F

## 2020-01-08 DIAGNOSIS — K219 Gastro-esophageal reflux disease without esophagitis: Secondary | ICD-10-CM | POA: Diagnosis not present

## 2020-01-08 NOTE — Progress Notes (Signed)
HPI: Gilbert Thompson is a 69 y.o. male who presents is referred by Dr. Sandi Mariscal for evaluation of chronic sore throat.  Patient states that he has had a sore throat for about 7 or 8 months.  Sometimes is worse than other times.  He had initially developed thrush with use of a steroid inhaler and was treated with nystatin suspension for couple weeks and a sore throat and thrush improved.  He has not noted any white exudate in his throat however he has noticed some erythema of his uvula. He uses the steroid inhaler or inhaler for his asthma only on a as needed basis and does not use it regularly. He uses nasal allergy spray Flonase as needed. He denies any yellow-green discharge from his nose.  He has not coughed up or blown out any yellow-green discharge from his nose or throat. He presently takes omeprazole 20 mg every morning.Marland Kitchen  No past medical history on file.  Social History   Socioeconomic History  . Marital status: Married    Spouse name: Not on file  . Number of children: Not on file  . Years of education: Not on file  . Highest education level: Not on file  Occupational History  . Not on file  Tobacco Use  . Smoking status: Never Smoker  . Smokeless tobacco: Never Used  Substance and Sexual Activity  . Alcohol use: Not on file  . Drug use: Not on file  . Sexual activity: Not on file  Other Topics Concern  . Not on file  Social History Narrative  . Not on file   Social Determinants of Health   Financial Resource Strain:   . Difficulty of Paying Living Expenses:   Food Insecurity:   . Worried About Charity fundraiser in the Last Year:   . Arboriculturist in the Last Year:   Transportation Needs:   . Film/video editor (Medical):   Marland Kitchen Lack of Transportation (Non-Medical):   Physical Activity:   . Days of Exercise per Week:   . Minutes of Exercise per Session:   Stress:   . Feeling of Stress :   Social Connections:   . Frequency of Communication with Friends and  Family:   . Frequency of Social Gatherings with Friends and Family:   . Attends Religious Services:   . Active Member of Clubs or Organizations:   . Attends Archivist Meetings:   Marland Kitchen Marital Status:    No family history on file. No Known Allergies Prior to Admission medications   Not on File     Positive ROS: Otherwise negative  All other systems have been reviewed and were otherwise negative with the exception of those mentioned in the HPI and as above.  Physical Exam: Constitutional: Alert, well-appearing, no acute distress Ears: External ears without lesions or tenderness. Ear canals are clear bilaterally with intact, clear TMs.  Nasal: External nose without lesions. Septum mildly deviated to the left.. Clear nasal passages bilaterally.  Both middle meatus regions are clear with no signs of infection.  Clear mucus within the nasal cavity. Oral: Lips and gums without lesions. Tongue and palate mucosa without lesions. Posterior oropharynx clear.  The uvula appears normal on exam today in the office with normal mucosa and no exudate within the oral cavity.  Tonsils are small bilaterally and symmetric in appearance.  No exudate. Fiberoptic laryngoscopy was performed to the right nostril.  The nasopharynx was clear.  The base of tongue  vallecula and epiglottis were normal.  Vocal cords were normal with normal vocal cord mobility.  Hypopharyngeal mucosa was clear with no evidence of exudate or thrush.  No evidence of neoplasm or infection. Neck: No palpable adenopathy or masses.  No palpable adenopathy on either side of the neck. Respiratory: Breathing comfortably  Skin: No facial/neck lesions or rash noted.  Laryngoscopy  Date/Time: 01/08/2020 7:22 PM Performed by: Rozetta Nunnery, MD Authorized by: Rozetta Nunnery, MD   Consent:    Consent obtained:  Verbal   Consent given by:  Patient   Risks discussed:  Pain Procedure details:    Indications: direct  visualization of the upper aerodigestive tract     Medication:  Afrin   Instrument: flexible fiberoptic laryngoscope   Sinus:    Right nasopharynx: normal   Mouth:    Oropharynx: normal     Vallecula: normal     Base of tongue: normal     Epiglottis: normal   Throat:    Pyriform sinus: normal     True vocal cords: normal   Comments:     Clear upper airway examination on fiberoptic laryngoscopy.  Could not identify any etiology of his sore throat although I suspect reflux could be contributing to this.    Assessment: Chronic sore throat questionable etiology.  Normal upper airway examination. History of GERD which may be contributing to sore throat.  Plan: Reassured patient of normal upper airway examination with no evidence of neoplasm or active infection. Also reviewed with him that regular use of Flonase should not cause any recurrence of his thrush. Suggested taking his omeprazole before dinner instead of first thing in the morning as this will provide better night time coverage of GERD. Suggested using throat lozenges for sore throat and gave him some samples of Cepacol to try. Recommend follow-up in 2 to 3 months if sore throat persists or worsens.   Radene Journey, MD   CC:

## 2020-01-10 ENCOUNTER — Ambulatory Visit (INDEPENDENT_AMBULATORY_CARE_PROVIDER_SITE_OTHER): Payer: Medicare HMO | Admitting: Otolaryngology

## 2021-10-20 DIAGNOSIS — L821 Other seborrheic keratosis: Secondary | ICD-10-CM | POA: Diagnosis not present

## 2021-10-20 DIAGNOSIS — D2362 Other benign neoplasm of skin of left upper limb, including shoulder: Secondary | ICD-10-CM | POA: Diagnosis not present

## 2021-10-20 DIAGNOSIS — D692 Other nonthrombocytopenic purpura: Secondary | ICD-10-CM | POA: Diagnosis not present

## 2022-02-23 ENCOUNTER — Ambulatory Visit
Admission: RE | Admit: 2022-02-23 | Discharge: 2022-02-23 | Disposition: A | Discharge status: Home / Self Care | Payer: Medicare HMO | Source: Ambulatory Visit | Attending: Family Medicine | Admitting: Family Medicine

## 2022-02-23 ENCOUNTER — Other Ambulatory Visit: Payer: Self-pay | Admitting: Family Medicine

## 2022-02-23 DIAGNOSIS — R059 Cough, unspecified: Secondary | ICD-10-CM | POA: Diagnosis not present

## 2022-02-23 DIAGNOSIS — R0602 Shortness of breath: Secondary | ICD-10-CM

## 2022-03-19 DIAGNOSIS — I10 Essential (primary) hypertension: Secondary | ICD-10-CM | POA: Diagnosis not present

## 2022-04-07 NOTE — Progress Notes (Signed)
Referring-Gilbert Sandi Mariscal, MD Reason for referral-dyspnea  HPI: 71 year old male for evaluation of dyspnea at request of Derinda Late, MD.  Chest x-ray June 2023 showed no active cardiopulmonary disease.  Patient is very active.  Over the past several months he has noted increased dyspnea on exertion.  This occurs with moderate activities.  It is relieved with rest.  Occasional chest tightness.  There is no orthopnea, PND, pedal edema, syncope.  No fevers, chills or productive cough.  Cardiology now asked to evaluate.  Current Outpatient Medications  Medication Sig Dispense Refill   albuterol (VENTOLIN HFA) 108 (90 Base) MCG/ACT inhaler Inhale into the lungs.     allopurinol (ZYLOPRIM) 300 MG tablet Take 300 mg by mouth daily.     ALPRAZolam (XANAX) 1 MG tablet Take 1 mg by mouth daily as needed.     chlorthalidone (HYGROTON) 25 MG tablet Take 12.5 mg by mouth every morning.     finasteride (PROSCAR) 5 MG tablet Take by mouth.     HYDROcodone-acetaminophen (NORCO/VICODIN) 5-325 MG tablet Take by mouth.     losartan (COZAAR) 50 MG tablet Take 50 mg by mouth daily.     omeprazole (PRILOSEC) 20 MG capsule Take 20 mg by mouth 2 (two) times daily.     rosuvastatin (CRESTOR) 40 MG tablet Take 40 mg by mouth daily.     TRELEGY ELLIPTA 100-62.5-25 MCG/ACT AEPB Inhale 1 puff into the lungs daily.     triamterene-hydrochlorothiazide (MAXZIDE-25) 37.5-25 MG tablet Take by mouth.     No current facility-administered medications for this visit.    No Known Allergies   Past Medical History:  Diagnosis Date   Asthma    Hyperlipidemia    Hypertension     History reviewed. No pertinent surgical history.  Social History   Socioeconomic History   Marital status: Married    Spouse name: Not on file   Number of children: 1   Years of education: Not on file   Highest education level: Not on file  Occupational History   Not on file  Tobacco Use   Smoking status: Never   Smokeless  tobacco: Never  Substance and Sexual Activity   Alcohol use: Yes    Comment: 2 drinks per day   Drug use: Not on file   Sexual activity: Not on file  Other Topics Concern   Not on file  Social History Narrative   Not on file   Social Determinants of Health   Financial Resource Strain: Not on file  Food Insecurity: Not on file  Transportation Needs: Not on file  Physical Activity: Not on file  Stress: Not on file  Social Connections: Not on file  Intimate Partner Violence: Not on file    Family History  Problem Relation Age of Onset   Coronary artery disease Father    Coronary artery disease Brother     ROS: no fevers or chills, productive cough, hemoptysis, dysphasia, odynophagia, melena, hematochezia, dysuria, hematuria, rash, seizure activity, orthopnea, PND, pedal edema, claudication. Remaining systems are negative.  Physical Exam:   Blood pressure 131/78, pulse 65, height '5\' 10"'$  (1.778 m), weight 194 lb 9.6 oz (88.3 kg), SpO2 99 %.  General:  Well developed/well nourished in NAD Skin warm/dry Patient not depressed No peripheral clubbing Back-normal HEENT-normal/normal eyelids Neck supple/normal carotid upstroke bilaterally; no bruits; no JVD; no thyromegaly chest - CTA/ normal expansion CV - RRR/normal S1 and S2; no murmurs, rubs or gallops;  PMI nondisplaced Abdomen -NT/ND, no  HSM, no mass, + bowel sounds, no bruit 2+ femoral pulses, no bruits Ext-no edema, chords, 2+ DP Neuro-grossly nonfocal  ECG -normal sinus rhythm at a rate of 65, first-degree AV block, right bundle branch block.  Personally reviewed  A/P  1 dyspnea/chest tightness-etiology unclear but significant family history in his father and brothers.  We will arrange a cardiac CTA to rule out obstructive coronary disease.  I will also arrange an echocardiogram to assess LV function.  2 hyperlipidemia-continue Crestor.  3 hypertension-patient's blood pressure is controlled.  We will continue  present medications and follow.  Kirk Ruths, MD

## 2022-04-20 ENCOUNTER — Encounter: Payer: Self-pay | Admitting: Cardiology

## 2022-04-20 ENCOUNTER — Ambulatory Visit: Payer: Medicare HMO | Admitting: Cardiology

## 2022-04-20 VITALS — BP 131/78 | HR 65 | Ht 70.0 in | Wt 194.6 lb

## 2022-04-20 DIAGNOSIS — R0609 Other forms of dyspnea: Secondary | ICD-10-CM | POA: Diagnosis not present

## 2022-04-20 DIAGNOSIS — I10 Essential (primary) hypertension: Secondary | ICD-10-CM

## 2022-04-20 DIAGNOSIS — E7800 Pure hypercholesterolemia, unspecified: Secondary | ICD-10-CM

## 2022-04-20 DIAGNOSIS — R072 Precordial pain: Secondary | ICD-10-CM | POA: Diagnosis not present

## 2022-04-20 MED ORDER — METOPROLOL TARTRATE 100 MG PO TABS: ORAL_TABLET | ORAL | 0 refills | Status: DC

## 2022-04-20 NOTE — Patient Instructions (Signed)
Testing/Procedures:  Your physician has requested that you have an echocardiogram. Echocardiography is a painless test that uses sound waves to create images of your heart. It provides your doctor with information about the size and shape of your heart and how well your heart's chambers and valves are working. This procedure takes approximately one hour. There are no restrictions for this procedure. [place]     Your cardiac CT will be scheduled at   [facility], [place] [address] [phone]    If scheduled at [facility], please arrive at the [facility] Entrance (Entrance C2) of [facility] 30 minutes prior to test start time. You can use the FREE valet parking offered at entrance C (encouraged to control the heart rate for the test)  Proceed to the [facility] Radiology Department (first floor) to check-in and test prep.  All radiology patients and guests should use entrance C2 at [facility], accessed from [facility], even though the hospital's physical address listed is [address]      Please follow these instructions carefully (unless otherwise directed):  Hold all erectile dysfunction medications at least 3 days (72 hrs) prior to test.  On the Night Before the Test: Be sure to Drink plenty of water. Do not consume any caffeinated/decaffeinated beverages or chocolate 12 hours prior to your test. Do not take any antihistamines 12 hours prior to your test.   On the Day of the Test: Drink plenty of water until 1 hour prior to the test. Do not eat any food 4 hours prior to the test. You may take your regular medications prior to the test.  Take metoprolol (Lopressor) 100 MG two hours prior to test.       After the Test: Drink plenty of water. After receiving IV contrast, you may experience a mild flushed feeling. This is normal. On occasion, you may  experience a mild rash up to 24 hours after the test. This is not dangerous. If this occurs, you can take Benadryl 25 mg and increase your fluid intake. If you experience trouble breathing, this can be serious. If it is severe call [phone] IMMEDIATELY. If it is mild, please call our office.   We will call to schedule your test 2-4 weeks out understanding that some insurance companies will need an authorization prior to the service being performed.   For non-scheduling related questions, please contact the cardiac imaging nurse navigator should you have any questions/concerns: Marchia Bond, Cardiac Imaging Nurse Navigator Gordy Clement, Cardiac Imaging Nurse Navigator [facility] Direct Office Dial: [phone]   For scheduling needs, including cancellations and rescheduling, please call [place], [phone].    Follow-Up: At [facility], you and your health needs are our priority.  As part of our continuing mission to provide you with exceptional heart care, we have created designated Provider Care Teams.  These Care Teams include your primary Cardiologist (physician) and Advanced Practice Providers (APPs -  Physician Assistants and Nurse Practitioners) who all work together to provide you with the care you need, when you need it.  We recommend signing up for the patient portal called "MyChart".  Sign up information is provided on this After Visit Summary.  MyChart is used to connect with patients for Virtual Visits (Telemedicine).  Patients are able to view lab/test results, encounter notes, upcoming appointments, etc.  Non-urgent messages can be sent to your provider  as well.   To learn more about what you can do with MyChart, go to [email or web address].    Your next appointment:   6 month(s)  The format for your next appointment:   In Person  Provider:   Kirk Ruths MD      Important Information About Sugar

## 2022-04-22 DIAGNOSIS — R0609 Other forms of dyspnea: Secondary | ICD-10-CM | POA: Diagnosis not present

## 2022-04-22 DIAGNOSIS — R072 Precordial pain: Secondary | ICD-10-CM | POA: Diagnosis not present

## 2022-04-22 LAB — BASIC METABOLIC PANEL
BUN/Creatinine Ratio: 16 (ref 10–24)
BUN: 22 mg/dL (ref 8–27)
CO2: 24 mmol/L (ref 20–29)
Calcium: 10 mg/dL (ref 8.6–10.2)
Chloride: 97 mmol/L (ref 96–106)
Creatinine, Ser: 1.34 mg/dL — ABNORMAL HIGH (ref 0.76–1.27)
Glucose: 124 mg/dL — ABNORMAL HIGH (ref 70–99)
Potassium: 4.2 mmol/L (ref 3.5–5.2)
Sodium: 136 mmol/L (ref 134–144)
eGFR: 56 mL/min/{1.73_m2} — ABNORMAL LOW (ref >59)

## 2022-04-27 ENCOUNTER — Encounter: Payer: Self-pay | Admitting: *Deleted

## 2022-05-04 ENCOUNTER — Ambulatory Visit (HOSPITAL_COMMUNITY): Payer: Medicare HMO | Attending: Cardiology

## 2022-05-04 ENCOUNTER — Encounter (HOSPITAL_COMMUNITY): Payer: Self-pay

## 2022-05-04 DIAGNOSIS — R072 Precordial pain: Secondary | ICD-10-CM | POA: Diagnosis not present

## 2022-05-04 DIAGNOSIS — R0609 Other forms of dyspnea: Secondary | ICD-10-CM | POA: Diagnosis not present

## 2022-05-04 LAB — ECHOCARDIOGRAM COMPLETE
Area-P 1/2: 3.12 cm2
S' Lateral: 2.8 cm

## 2022-05-06 ENCOUNTER — Telehealth (HOSPITAL_COMMUNITY): Payer: Self-pay | Admitting: *Deleted

## 2022-05-06 NOTE — Telephone Encounter (Signed)
Reaching out to Gilbert Thompson to offer assistance regarding upcoming cardiac imaging study; pt verbalizes understanding of appt date/time, parking situation and where to check in, pre-test NPO status and medications ordered, and verified current allergies; name and call back number provided for further questions should they arise  Gilbert Clement RN Navigator Cardiac Imaging [facility] [phone] office [phone] cell  Gilbert Thompson to take '50mg'$  metoprolol tartrate two hours prior to his cardiac CT scan. He is aware to arrive at 8:30am.

## 2022-05-06 NOTE — Telephone Encounter (Signed)
Attempted to call patient regarding upcoming cardiac CT appointment. °Left message on voicemail with name and callback number ° °Elara Cocke RN Navigator Cardiac Imaging °[facility] °[phone] Office °[phone] Cell ° °

## 2022-05-07 ENCOUNTER — Ambulatory Visit (HOSPITAL_COMMUNITY)
Admission: RE | Admit: 2022-05-07 | Discharge: 2022-05-07 | Disposition: A | Discharge status: Home / Self Care | Payer: Medicare HMO | Source: Ambulatory Visit | Attending: Cardiology | Admitting: Cardiology

## 2022-05-07 ENCOUNTER — Other Ambulatory Visit: Payer: Self-pay | Admitting: Cardiology

## 2022-05-07 ENCOUNTER — Ambulatory Visit (HOSPITAL_BASED_OUTPATIENT_CLINIC_OR_DEPARTMENT_OTHER)
Admission: RE | Admit: 2022-05-07 | Discharge: 2022-05-07 | Disposition: A | Discharge status: Home / Self Care | Payer: Medicare HMO | Source: Ambulatory Visit | Attending: Cardiology | Admitting: Cardiology

## 2022-05-07 DIAGNOSIS — R931 Abnormal findings on diagnostic imaging of heart and coronary circulation: Secondary | ICD-10-CM | POA: Diagnosis not present

## 2022-05-07 DIAGNOSIS — R072 Precordial pain: Secondary | ICD-10-CM | POA: Diagnosis present

## 2022-05-07 DIAGNOSIS — R0609 Other forms of dyspnea: Secondary | ICD-10-CM | POA: Diagnosis present

## 2022-05-07 DIAGNOSIS — I2510 Atherosclerotic heart disease of native coronary artery without angina pectoris: Secondary | ICD-10-CM | POA: Diagnosis not present

## 2022-05-07 MED ORDER — NITROGLYCERIN 0.4 MG SL SUBL
0.8000 mg | SUBLINGUAL_TABLET | Freq: Once | SUBLINGUAL | Status: AC
  Administered 2022-05-07: 0.8 mg via SUBLINGUAL

## 2022-05-07 MED ORDER — NITROGLYCERIN 0.4 MG SL SUBL
SUBLINGUAL_TABLET | SUBLINGUAL | Status: AC
  Filled 2022-05-07: qty 2

## 2022-05-07 MED ORDER — IOHEXOL 350 MG/ML SOLN
100.0000 mL | Freq: Once | INTRAVENOUS | Status: AC | PRN
  Administered 2022-05-07: 100 mL via INTRAVENOUS

## 2022-05-11 ENCOUNTER — Telehealth: Payer: Self-pay | Admitting: Cardiology

## 2022-05-11 MED ORDER — ASPIRIN 81 MG PO TBEC: 81.0000 mg | DELAYED_RELEASE_TABLET | Freq: Every day | ORAL | 3 refills | Status: DC

## 2022-05-11 NOTE — Telephone Encounter (Signed)
Spoke with pt, echo and CTA results discussed in detail. He was offered an appointment tomorrow to be seen but was unavailable. He is schedule to see the NP Friday 05/15/22. He was instructed to start a baby aspirin 81 mg once daily. ER precautions also discussed with the patient and he voiced understanding.

## 2022-05-11 NOTE — Telephone Encounter (Signed)
Pt returning a call for ct results

## 2022-05-15 ENCOUNTER — Ambulatory Visit: Payer: Medicare HMO | Admitting: Nurse Practitioner

## 2022-05-15 ENCOUNTER — Encounter: Payer: Self-pay | Admitting: Nurse Practitioner

## 2022-05-15 VITALS — BP 126/68 | HR 72 | Ht 70.0 in | Wt 191.0 lb

## 2022-05-15 DIAGNOSIS — R0609 Other forms of dyspnea: Secondary | ICD-10-CM | POA: Diagnosis not present

## 2022-05-15 DIAGNOSIS — E785 Hyperlipidemia, unspecified: Secondary | ICD-10-CM

## 2022-05-15 DIAGNOSIS — I10 Essential (primary) hypertension: Secondary | ICD-10-CM | POA: Diagnosis not present

## 2022-05-15 DIAGNOSIS — I2510 Atherosclerotic heart disease of native coronary artery without angina pectoris: Secondary | ICD-10-CM | POA: Diagnosis not present

## 2022-05-15 MED ORDER — NITROGLYCERIN 0.4 MG SL SUBL: 0.4000 mg | SUBLINGUAL_TABLET | SUBLINGUAL | 3 refills | Status: DC | PRN

## 2022-05-15 MED ORDER — SODIUM CHLORIDE 0.9% FLUSH: 3.0000 mL | Freq: Two times a day (BID) | INTRAVENOUS | Status: DC

## 2022-05-15 NOTE — Patient Instructions (Addendum)
Medication Instructions:  Your physician recommends that you continue on your current medications as directed. Please refer to the Current Medication list given to you today.   Nitroglycerin .4 mg use as needed for chest pain  *If you need a refill on your cardiac medications before your next appointment, please call your pharmacy*   Lab Work: CBC & BMET 1 week before procedure.   If you have labs (blood work) drawn today and your tests are completely normal, you will receive your results only by: [place] (if you have MyChart) OR A paper copy in the mail If you have any lab test that is abnormal or we need to change your treatment, we will call you to review the results.   Testing/Procedures: Your physician has requested that you have a cardiac catheterization. Cardiac catheterization is used to diagnose and/or treat various heart conditions. Doctors may recommend this procedure for a number of different reasons. The most common reason is to evaluate chest pain. Chest pain can be a symptom of coronary artery disease (CAD), and cardiac catheterization can show whether plaque is narrowing or blocking your heart's arteries. This procedure is also used to evaluate the valves, as well as measure the blood flow and oxygen levels in different parts of your heart. For further information please visit [email or web address]. Please follow instruction sheet, as given.    [facility] [place] [place] [address] Dept: [phone] Loc: [phone]  Gilbert Thompson  05/15/2022  You are scheduled for a Cardiac Catheterization on Friday, September 15 with Dr. Daneen Schick.  1. Please arrive at the Main Entrance A at [facility]: [place], [place] [address] at 7:30 AM (This time is two hours before your procedure to ensure your preparation). Free valet parking service is available.    Special note: Every effort is made to have your procedure done on time. Please understand that emergencies sometimes delay scheduled procedures.  2. Diet: Do not eat solid foods after midnight.  You may have clear liquids until 5 AM upon the day of the procedure.  3. Labs: You will need to have blood drawn on Friday, September 7 at [facility]  Open: 8am - 5pm (Lunch 12:30 - 1:30)   Phone: [phone]. You do not need to be fasting.  4. Medication instructions in preparation for your procedure:   Contrast Allergy: No    Current Outpatient Medications (Cardiovascular):    chlorthalidone (HYGROTON) 25 MG tablet, Take 12.5 mg by mouth every morning.   losartan (COZAAR) 50 MG tablet, Take 50 mg by mouth daily.   metoprolol tartrate (LOPRESSOR) 100 MG tablet, TAKE 2 HOURS PRIOR TO CT SCAN   rosuvastatin (CRESTOR) 40 MG tablet, Take 40 mg by mouth daily.   triamterene-hydrochlorothiazide (MAXZIDE-25) 37.5-25 MG tablet, Take by mouth.  Current Outpatient Medications (Respiratory):    albuterol (VENTOLIN HFA) 108 (90 Base) MCG/ACT inhaler, Inhale into the lungs.   TRELEGY ELLIPTA 100-62.5-25 MCG/ACT AEPB, Inhale 1 puff into the lungs daily.  Current Outpatient Medications (Analgesics):    allopurinol (ZYLOPRIM) 300 MG tablet, Take 300 mg by mouth daily.   aspirin EC 81 MG tablet, Take 1 tablet (81 mg total) by mouth daily. Swallow whole.   HYDROcodone-acetaminophen (NORCO/VICODIN) 5-325 MG tablet, Take by mouth.   Current Outpatient Medications (Other):    ALPRAZolam (XANAX) 1 MG tablet, Take 1 mg by mouth daily as  needed.   finasteride (PROSCAR) 5 MG tablet, Take by mouth.   omeprazole (PRILOSEC) 20 MG capsule, Take 20 mg by mouth 2 (two) times daily. *For reference purposes while preparing patient instructions.   Delete this med list prior to printing instructions for patient.*     On the morning of your procedure, take Aspirin and any  morning medicines NOT listed above.  You may use sips of water.  5. Plan to go home the same day, you will only stay overnight if medically necessary. 6. You MUST have a responsible adult to drive you home. 7. An adult MUST be with you the first 24 hours after you arrive home. 8. Bring a current list of your medications, and the last time and date medication taken. 9. Bring ID and current insurance cards. 10.Please wear clothes that are easy to get on and off and wear slip-on shoes.  Thank you for allowing [place] to care for you!   -- [facility]     Follow-Up: At [facility], you and your health needs are our priority.  As part of our continuing mission to provide you with exceptional heart care, we have created designated Provider Care Teams.  These Care Teams include your primary Cardiologist (physician) and Advanced Practice Providers (APPs -  Physician Assistants and Nurse Practitioners) who all work together to provide you with the care you need, when you need it.  We recommend signing up for the patient portal called "MyChart".  Sign up information is provided on this After Visit Summary.  MyChart is used to connect with patients for Virtual Visits (Telemedicine).  Patients are able to view lab/test results, encounter notes, upcoming appointments, etc.  Non-urgent messages can be sent to your provider as well.   To learn more about what you can do with MyChart, go to [email or web address].    Your next appointment:   6 week(s)  The format for your next appointment:   In Person  Provider:   Kirk Ruths, MD  or Diona Browner, NP        Other Instructions   Important Information About Sugar

## 2022-05-15 NOTE — H&P (View-Only) (Signed)
Office Visit    Patient Name: Gilbert Thompson Date of Encounter: 05/15/2022  Primary Care Provider:  Derinda Late, MD Primary Cardiologist:  Gilbert Ruths, MD  Chief Complaint    71 year old male with a history of CAD, hypertension,  hyperlipidemia, and asthma who presents for follow-up related to CAD.  Past Medical History    Past Medical History:  Diagnosis Date   Asthma    Hyperlipidemia    Hypertension    History reviewed. No pertinent surgical history.  Allergies  No Known Allergies  History of Present Illness    71 year old male with the above past medical history including CAD, hypertension,  hyperlipidemia, and asthma.  He was referred to Dr. Stanford Thompson by his PCP in July 2023 in the setting of dyspnea and intermittent chest tightness.  He was last seen in the office on 04/20/2022 and reported dyspnea and chest tightness though he remained active.  However, given significant family history of CAD coronary CTA was arranged which revealed calcium score of 620 (74th percentile), moderate to severe atherosclerosis of mid and distal LAD, otherwise mild nonobstructive disease.  FFR analysis demonstrated probable flow-limiting lesion in mid LAD.  Cardiac catheterization was recommended.  Echocardiogram showed EF 60 to 65%, normal LV function, severe concentric LVH, G1 DD, normal RV systolic function, mild mitral valve regurgitation, aortic valve sclerosis without any evidence of aortic stenosis.  He presents today for follow-up accompanied by his wife. Since his last visit he has been stable from a cardiac standpoint.  He has noted some occasional intermittent shortness of breath, mild intermittent chest discomfort, though this has been rare.  He has played golf without any symptoms.  He is eager to discuss results of his coronary CTA.  Overall, he reports feeling well and denies any additional concerns today.  Home Medications    Current Outpatient Medications  Medication  Sig Dispense Refill   albuterol (VENTOLIN HFA) 108 (90 Base) MCG/ACT inhaler Inhale into the lungs.     allopurinol (ZYLOPRIM) 300 MG tablet Take 300 mg by mouth daily.     ALPRAZolam (XANAX) 1 MG tablet Take 1 mg by mouth daily as needed.     aspirin EC 81 MG tablet Take 1 tablet (81 mg total) by mouth daily. Swallow whole. 90 tablet 3   chlorthalidone (HYGROTON) 25 MG tablet Take 12.5 mg by mouth every morning.     finasteride (PROSCAR) 5 MG tablet Take by mouth.     HYDROcodone-acetaminophen (NORCO/VICODIN) 5-325 MG tablet Take by mouth.     losartan (COZAAR) 50 MG tablet Take 50 mg by mouth daily.     metoprolol tartrate (LOPRESSOR) 100 MG tablet TAKE 2 HOURS PRIOR TO CT SCAN 1 tablet 0   nitroGLYCERIN (NITROSTAT) 0.4 MG SL tablet Place 1 tablet (0.4 mg total) under the tongue every 5 (five) minutes as needed for chest pain. 90 tablet 3   omeprazole (PRILOSEC) 20 MG capsule Take 20 mg by mouth 2 (two) times daily.     rosuvastatin (CRESTOR) 40 MG tablet Take 40 mg by mouth daily.     TRELEGY ELLIPTA 100-62.5-25 MCG/ACT AEPB Inhale 1 puff into the lungs daily.     triamterene-hydrochlorothiazide (MAXZIDE-25) 37.5-25 MG tablet Take by mouth.     No current facility-administered medications for this visit.     Review of Systems    He denies chest pain, palpitations, pnd, orthopnea, n, v, dizziness, syncope, edema, weight gain, or early satiety. All other systems reviewed and are otherwise  negative except as noted above.   Physical Exam    VS:  BP 126/68 (BP Location: Right Arm, Patient Position: Sitting, Cuff Size: Normal)   Pulse 72   Ht '5\' 10"'$  (1.778 m)   Wt 191 lb (86.6 kg)   BMI 27.41 kg/m   GEN: Well nourished, well developed, in no acute distress. HEENT: normal. Neck: Supple, no JVD, carotid bruits, or masses. Cardiac: RRR, no murmurs, rubs, or gallops. No clubbing, cyanosis, edema.  Radials/DP/PT 2+ and equal bilaterally.  Respiratory:  Respirations regular and unlabored,  clear to auscultation bilaterally. GI: Soft, nontender, nondistended, BS + x 4. MS: no deformity or atrophy. Skin: warm and dry, no rash. Neuro:  Strength and sensation are intact. Psych: Normal affect.  Accessory Clinical Findings    ECG personally reviewed by me today -sinus rhythm, 72 bpm, first-degree AV block, RBBB- no acute changes.   No results found for: "WBC", "HGB", "HCT", "MCV", "PLT" Lab Results  Component Value Date   CREATININE 1.34 (H) 04/22/2022   BUN 22 04/22/2022   NA 136 04/22/2022   K 4.2 04/22/2022   CL 97 04/22/2022   CO2 24 04/22/2022   No results found for: "ALT", "AST", "GGT", "ALKPHOS", "BILITOT" No results found for: "CHOL", "HDL", "LDLCALC", "LDLDIRECT", "TRIG", "CHOLHDL"  No results found for: "HGBA1C"  Assessment & Plan    1. CAD/dyspnea on exertion: Coronary CTA revealed calcium score of 620 (74th percentile), moderate to severe atherosclerosis of mid and distal LAD, otherwise mild nonobstructive disease.  FFR analysis demonstrated probable flow-limiting lesion in mid LAD.  Cardiac catheterization was recommended.  Echo showed EF 60 to 65%, normal LV function, severe concentric LVH, G1 DD, normal RV systolic function, mild mitral valve regurgitation, aortic valve sclerosis without any evidence of aortic stenosis.  Discussed with Dr. Stanford Thompson, primary cardiologist, prior to today's visit, will proceed with cardiac catheterization.  Wife is leaving the country for 13 days beginning Thursday of next week.  Given stable symptoms, will postpone catheterization until her return to the [place].  LHC scheduled for 06/05/2022 at 7 am with Dr. Tamala Julian.  Will check CBC, BMET 1 week prior.  Provided prescription for sublingual nitroglycerin.  Discussed ED precautions.   Shared Decision Making/Informed Consent The risks [stroke (1 in 1000), death (1 in 1000), kidney failure [usually temporary] (1 in 500), bleeding (1 in 200), allergic reaction [possibly serious] (1 in 200)],  benefits (diagnostic support and management of coronary artery disease) and alternatives of a cardiac catheterization were discussed in detail with Gilbert Thompson and he is willing to proceed.  2. Hypertension: BP well controlled. Continue current antihypertensive regimen.   3. Hyperlipidemia: No recent LDL on file.  This has been monitored and managed per his PCP.  States recent labs were sent to Dr. Stanford Thompson.  I do not have these available for review today.  Continue aspirin, Crestor for LDL goal<70.   4. Disposition: Follow-up 2 weeks post cath.      Lenna Sciara, NP 05/15/2022, 4:32 PM

## 2022-05-15 NOTE — Progress Notes (Signed)
Office Visit    Patient Name: Gilbert Thompson Date of Encounter: 05/15/2022  Primary Care Provider:  Derinda Late, MD Primary Cardiologist:  Kirk Ruths, MD  Chief Complaint    71 year old male with a history of CAD, hypertension,  hyperlipidemia, and asthma who presents for follow-up related to CAD.  Past Medical History    Past Medical History:  Diagnosis Date   Asthma    Hyperlipidemia    Hypertension    History reviewed. No pertinent surgical history.  Allergies  No Known Allergies  History of Present Illness    71 year old male with the above past medical history including CAD, hypertension,  hyperlipidemia, and asthma.  He was referred to Dr. Stanford Breed by his PCP in July 2023 in the setting of dyspnea and intermittent chest tightness.  He was last seen in the office on 04/20/2022 and reported dyspnea and chest tightness though he remained active.  However, given significant family history of CAD coronary CTA was arranged which revealed calcium score of 620 (74th percentile), moderate to severe atherosclerosis of mid and distal LAD, otherwise mild nonobstructive disease.  FFR analysis demonstrated probable flow-limiting lesion in mid LAD.  Cardiac catheterization was recommended.  Echocardiogram showed EF 60 to 65%, normal LV function, severe concentric LVH, G1 DD, normal RV systolic function, mild mitral valve regurgitation, aortic valve sclerosis without any evidence of aortic stenosis.  He presents today for follow-up accompanied by his wife. Since his last visit he has been stable from a cardiac standpoint.  He has noted some occasional intermittent shortness of breath, mild intermittent chest discomfort, though this has been rare.  He has played golf without any symptoms.  He is eager to discuss results of his coronary CTA.  Overall, he reports feeling well and denies any additional concerns today.  Home Medications    Current Outpatient Medications  Medication  Sig Dispense Refill   albuterol (VENTOLIN HFA) 108 (90 Base) MCG/ACT inhaler Inhale into the lungs.     allopurinol (ZYLOPRIM) 300 MG tablet Take 300 mg by mouth daily.     ALPRAZolam (XANAX) 1 MG tablet Take 1 mg by mouth daily as needed.     aspirin EC 81 MG tablet Take 1 tablet (81 mg total) by mouth daily. Swallow whole. 90 tablet 3   chlorthalidone (HYGROTON) 25 MG tablet Take 12.5 mg by mouth every morning.     finasteride (PROSCAR) 5 MG tablet Take by mouth.     HYDROcodone-acetaminophen (NORCO/VICODIN) 5-325 MG tablet Take by mouth.     losartan (COZAAR) 50 MG tablet Take 50 mg by mouth daily.     metoprolol tartrate (LOPRESSOR) 100 MG tablet TAKE 2 HOURS PRIOR TO CT SCAN 1 tablet 0   nitroGLYCERIN (NITROSTAT) 0.4 MG SL tablet Place 1 tablet (0.4 mg total) under the tongue every 5 (five) minutes as needed for chest pain. 90 tablet 3   omeprazole (PRILOSEC) 20 MG capsule Take 20 mg by mouth 2 (two) times daily.     rosuvastatin (CRESTOR) 40 MG tablet Take 40 mg by mouth daily.     TRELEGY ELLIPTA 100-62.5-25 MCG/ACT AEPB Inhale 1 puff into the lungs daily.     triamterene-hydrochlorothiazide (MAXZIDE-25) 37.5-25 MG tablet Take by mouth.     No current facility-administered medications for this visit.     Review of Systems    He denies chest pain, palpitations, pnd, orthopnea, n, v, dizziness, syncope, edema, weight gain, or early satiety. All other systems reviewed and are otherwise  negative except as noted above.   Physical Exam    VS:  BP 126/68 (BP Location: Right Arm, Patient Position: Sitting, Cuff Size: Normal)   Pulse 72   Ht '5\' 10"'$  (1.778 m)   Wt 191 lb (86.6 kg)   BMI 27.41 kg/m   GEN: Well nourished, well developed, in no acute distress. HEENT: normal. Neck: Supple, no JVD, carotid bruits, or masses. Cardiac: RRR, no murmurs, rubs, or gallops. No clubbing, cyanosis, edema.  Radials/DP/PT 2+ and equal bilaterally.  Respiratory:  Respirations regular and unlabored,  clear to auscultation bilaterally. GI: Soft, nontender, nondistended, BS + x 4. MS: no deformity or atrophy. Skin: warm and dry, no rash. Neuro:  Strength and sensation are intact. Psych: Normal affect.  Accessory Clinical Findings    ECG personally reviewed by me today -sinus rhythm, 72 bpm, first-degree AV block, RBBB- no acute changes.   No results found for: "WBC", "HGB", "HCT", "MCV", "PLT" Lab Results  Component Value Date   CREATININE 1.34 (H) 04/22/2022   BUN 22 04/22/2022   NA 136 04/22/2022   K 4.2 04/22/2022   CL 97 04/22/2022   CO2 24 04/22/2022   No results found for: "ALT", "AST", "GGT", "ALKPHOS", "BILITOT" No results found for: "CHOL", "HDL", "LDLCALC", "LDLDIRECT", "TRIG", "CHOLHDL"  No results found for: "HGBA1C"  Assessment & Plan    1. CAD/dyspnea on exertion: Coronary CTA revealed calcium score of 620 (74th percentile), moderate to severe atherosclerosis of mid and distal LAD, otherwise mild nonobstructive disease.  FFR analysis demonstrated probable flow-limiting lesion in mid LAD.  Cardiac catheterization was recommended.  Echo showed EF 60 to 65%, normal LV function, severe concentric LVH, G1 DD, normal RV systolic function, mild mitral valve regurgitation, aortic valve sclerosis without any evidence of aortic stenosis.  Discussed with Dr. Stanford Breed, primary cardiologist, prior to today's visit, will proceed with cardiac catheterization.  Wife is leaving the country for 13 days beginning Thursday of next week.  Given stable symptoms, will postpone catheterization until her return to the [place].  LHC scheduled for 06/05/2022 at 7 am with Dr. Tamala Julian.  Will check CBC, BMET 1 week prior.  Provided prescription for sublingual nitroglycerin.  Discussed ED precautions.   Shared Decision Making/Informed Consent The risks [stroke (1 in 1000), death (1 in 1000), kidney failure [usually temporary] (1 in 500), bleeding (1 in 200), allergic reaction [possibly serious] (1 in 200)],  benefits (diagnostic support and management of coronary artery disease) and alternatives of a cardiac catheterization were discussed in detail with Mr. Palardy and he is willing to proceed.  2. Hypertension: BP well controlled. Continue current antihypertensive regimen.   3. Hyperlipidemia: No recent LDL on file.  This has been monitored and managed per his PCP.  States recent labs were sent to Dr. Stanford Breed.  I do not have these available for review today.  Continue aspirin, Crestor for LDL goal<70.   4. Disposition: Follow-up 2 weeks post cath.      Lenna Sciara, NP 05/15/2022, 4:32 PM

## 2022-05-28 DIAGNOSIS — I10 Essential (primary) hypertension: Secondary | ICD-10-CM | POA: Diagnosis not present

## 2022-05-28 DIAGNOSIS — E785 Hyperlipidemia, unspecified: Secondary | ICD-10-CM | POA: Diagnosis not present

## 2022-05-28 DIAGNOSIS — I2510 Atherosclerotic heart disease of native coronary artery without angina pectoris: Secondary | ICD-10-CM | POA: Diagnosis not present

## 2022-05-29 LAB — BASIC METABOLIC PANEL
BUN/Creatinine Ratio: 20 (ref 10–24)
BUN: 24 mg/dL (ref 8–27)
CO2: 23 mmol/L (ref 20–29)
Calcium: 9.6 mg/dL (ref 8.6–10.2)
Chloride: 99 mmol/L (ref 96–106)
Creatinine, Ser: 1.19 mg/dL (ref 0.76–1.27)
Glucose: 91 mg/dL (ref 70–99)
Potassium: 4.3 mmol/L (ref 3.5–5.2)
Sodium: 138 mmol/L (ref 134–144)
eGFR: 65 mL/min/{1.73_m2} (ref >59)

## 2022-05-29 LAB — CBC
Hematocrit: 39.2 % (ref 37.5–51.0)
Hemoglobin: 13.5 g/dL (ref 13.0–17.7)
MCH: 31.6 pg (ref 26.6–33.0)
MCHC: 34.4 g/dL (ref 31.5–35.7)
MCV: 92 fL (ref 79–97)
Platelets: 257 10*3/uL (ref 150–450)
RBC: 4.27 x10E6/uL (ref 4.14–5.80)
RDW: 12.5 % (ref 11.6–15.4)
WBC: 6.4 10*3/uL (ref 3.4–10.8)

## 2022-06-03 NOTE — Progress Notes (Signed)
HPI: Follow-up coronary artery disease and dyspnea.  Coronary CTA August 2023 showed calcium score 620 which was 74th percentile, moderate to severe stenosis in the mid LAD with otherwise mild nonobstructive coronary disease; there was note of 4 mm pulmonary nodule in the right middle lobe.  FFR abnormal in the LAD distribution.  Echocardiogram August 2023 showed normal LV function, severe left ventricular hypertrophy, grade 1 diastolic dysfunction, mild mitral regurgitation, trace aortic insufficiency.  Cardiac catheterization performed September 2023 showed 75% followed by 70% lesions in left ventricular end-diastolic pressure 10.  Medical therapy recommended with high risk PCI if symptoms cannot be controlled.  Since last seen patient states he "is not doing well".  He feels bad.  He denies dyspnea on exertion, orthopnea, PND, pedal edema or syncope.  Occasional dizziness with standing.  He occasionally feels a discomfort in his epigastric area pushing towards his chest.  It is continuous.  He is not having exertional chest pain.  Current Outpatient Medications  Medication Sig Dispense Refill   albuterol (VENTOLIN HFA) 108 (90 Base) MCG/ACT inhaler Inhale 1-2 puffs into the lungs every 6 (six) hours as needed for wheezing or shortness of breath.     allopurinol (ZYLOPRIM) 300 MG tablet Take 300 mg by mouth daily.     ALPRAZolam (XANAX) 1 MG tablet Take 1 mg by mouth at bedtime as needed for anxiety or sleep.     aspirin EC 81 MG tablet Take 1 tablet (81 mg total) by mouth daily. Swallow whole. 90 tablet 3   cholecalciferol (VITAMIN D3) 25 MCG (1000 UNIT) tablet Take 1,000 Units by mouth daily.     clopidogrel (PLAVIX) 75 MG tablet Take 1 tablet (75 mg total) by mouth daily. 30 tablet 11   finasteride (PROSCAR) 5 MG tablet 1.25 mg daily.     HYDROcodone-acetaminophen (NORCO/VICODIN) 5-325 MG tablet Take 1 tablet by mouth every 6 (six) hours as needed for severe pain.     losartan (COZAAR) 50 MG  tablet Take 50 mg by mouth daily.     metoprolol succinate (TOPROL XL) 25 MG 24 hr tablet Take 1 tablet (25 mg total) by mouth daily. 30 tablet 11   nitroGLYCERIN (NITROSTAT) 0.4 MG SL tablet Place 1 tablet (0.4 mg total) under the tongue every 5 (five) minutes as needed for chest pain. 90 tablet 3   omeprazole (PRILOSEC) 20 MG capsule Take 20 mg by mouth 2 (two) times daily.     rosuvastatin (CRESTOR) 40 MG tablet Take 40 mg by mouth daily.     TRELEGY ELLIPTA 100-62.5-25 MCG/ACT AEPB Inhale 1 puff into the lungs daily.     triamterene-hydrochlorothiazide (MAXZIDE-25) 37.5-25 MG tablet Take 0.5 tablets by mouth daily.     Current Facility-Administered Medications  Medication Dose Route Frequency Provider Last Rate Last Admin   sodium chloride flush (NS) 0.9 % injection 3 mL  3 mL Intravenous Q12H Lenna Sciara, NP         Past Medical History:  Diagnosis Date   Asthma    Hyperlipidemia    Hypertension     Past Surgical History:  Procedure Laterality Date   LEFT HEART CATH AND CORONARY ANGIOGRAPHY N/A 06/05/2022   Procedure: LEFT HEART CATH AND CORONARY ANGIOGRAPHY;  Surgeon: Belva Crome, MD;  Location: [facility];  Service: Cardiovascular;  Laterality: N/A;    Social History   Socioeconomic History   Marital status: Married    Spouse name: Not on file  Number of children: 1   Years of education: Not on file   Highest education level: Not on file  Occupational History   Not on file  Tobacco Use   Smoking status: Never   Smokeless tobacco: Never  Substance and Sexual Activity   Alcohol use: Yes    Comment: 2 drinks per day   Drug use: Not on file   Sexual activity: Not on file  Other Topics Concern   Not on file  Social History Narrative   Not on file   Social Determinants of Health   Financial Resource Strain: Not on file  Food Insecurity: Not on file  Transportation Needs: Not on file  Physical Activity: Not on file  Stress: Not on file  Social  Connections: Not on file  Intimate Partner Violence: Not on file    Family History  Problem Relation Age of Onset   Coronary artery disease Father    Coronary artery disease Brother     ROS: no fevers or chills, productive cough, hemoptysis, dysphasia, odynophagia, melena, hematochezia, dysuria, hematuria, rash, seizure activity, orthopnea, PND, pedal edema, claudication. Remaining systems are negative.  Physical Exam: Well-developed well-nourished in no acute distress.  Skin is warm and dry.  HEENT is normal.  Neck is supple.  Chest is clear to auscultation with normal expansion.  Cardiovascular exam is regular rate and rhythm.  Abdominal exam nontender or distended. No masses palpated. Extremities show no edema. neuro grossly intact   A/P  1 coronary artery disease-noted on recent catheterization.  We will continue aspirin, Plavix, Toprol and Crestor.  Long discussion today concerning options.  I explained that PCI could be considered but would only improve symptoms.  Not clear to me that he is having exertional chest pain though he states he "feels bad".  He cannot be specific.  He will follow-up with Dr. Sandi Mariscal to see if other issues could be contributing to his symptoms.  If not we could consider PCI with the understanding that may not improve his symptoms long-term.  He will contact us with results of his visit with Dr. Sandi Mariscal.  2 hypertension-he is having some dizziness with standing up.  I will discontinue triamterene hydrochlorothiazide and follow his blood pressure and symptoms.  3 hyperlipidemia-continue Crestor.  We will obtain most recent lipids and liver from Dr. Deboraha Sprang office.  Our goal LDL will be 50.  Greater than 30 minutes spent in the office today with patient and wife.  Kirk Ruths, MD

## 2022-06-04 ENCOUNTER — Telehealth: Payer: Self-pay | Admitting: *Deleted

## 2022-06-04 NOTE — Telephone Encounter (Signed)
Pt is returning call. Transferred to Katrine Coho, RN

## 2022-06-04 NOTE — Telephone Encounter (Addendum)
Cardiac Catheterization scheduled at [facility] for: Friday June 05, 2022 7:30 AM Arrival time and place: [place] Entrance A at: 5:30 AM  Nothing to eat after midnight prior to procedure, clear liquids until 5 AM day of procedure.  Medication instructions: -Hold:  Triamterene/HCT-AM of procedure -Except hold medications usual morning medications can be taken with sips of water including aspirin 81 mg.  Confirmed patient has responsible adult to drive home post procedure and be with patient first 24 hours after arriving home.  Patient reports no new symptoms concerning for COVID-19 in the past 10 days.  Reviewed procedure instructions with patient.

## 2022-06-04 NOTE — H&P (Signed)
Abnormal Cor CTA with mid to distal segmental disease. Elevated calcium score DOE and angina

## 2022-06-05 ENCOUNTER — Encounter (HOSPITAL_COMMUNITY): Payer: Self-pay | Admitting: Interventional Cardiology

## 2022-06-05 ENCOUNTER — Ambulatory Visit (HOSPITAL_COMMUNITY)
Admission: RE | Admit: 2022-06-05 | Discharge: 2022-06-05 | Disposition: A | Discharge status: Home / Self Care | Payer: Medicare HMO | Attending: Interventional Cardiology | Admitting: Interventional Cardiology

## 2022-06-05 ENCOUNTER — Encounter (HOSPITAL_COMMUNITY)
Admission: RE | Disposition: A | Discharge status: Home / Self Care | Payer: Self-pay | Source: Home / Self Care | Attending: Interventional Cardiology

## 2022-06-05 ENCOUNTER — Other Ambulatory Visit: Payer: Self-pay

## 2022-06-05 DIAGNOSIS — J45909 Unspecified asthma, uncomplicated: Secondary | ICD-10-CM | POA: Insufficient documentation

## 2022-06-05 DIAGNOSIS — R0609 Other forms of dyspnea: Secondary | ICD-10-CM | POA: Diagnosis not present

## 2022-06-05 DIAGNOSIS — I10 Essential (primary) hypertension: Secondary | ICD-10-CM | POA: Diagnosis not present

## 2022-06-05 DIAGNOSIS — Z7982 Long term (current) use of aspirin: Secondary | ICD-10-CM | POA: Diagnosis not present

## 2022-06-05 DIAGNOSIS — Z79899 Other long term (current) drug therapy: Secondary | ICD-10-CM | POA: Insufficient documentation

## 2022-06-05 DIAGNOSIS — E785 Hyperlipidemia, unspecified: Secondary | ICD-10-CM | POA: Diagnosis not present

## 2022-06-05 DIAGNOSIS — I2510 Atherosclerotic heart disease of native coronary artery without angina pectoris: Secondary | ICD-10-CM | POA: Diagnosis not present

## 2022-06-05 DIAGNOSIS — I2584 Coronary atherosclerosis due to calcified coronary lesion: Secondary | ICD-10-CM | POA: Diagnosis not present

## 2022-06-05 HISTORY — PX: LEFT HEART CATH AND CORONARY ANGIOGRAPHY: CATH118249

## 2022-06-05 SURGERY — LEFT HEART CATH AND CORONARY ANGIOGRAPHY
Anesthesia: LOCAL

## 2022-06-05 MED ORDER — SODIUM CHLORIDE 0.9% FLUSH: 3.0000 mL | INTRAVENOUS | Status: DC | PRN

## 2022-06-05 MED ORDER — SODIUM CHLORIDE 0.9 % WEIGHT BASED INFUSION: 1.0000 mL/kg/h | INTRAVENOUS | Status: DC

## 2022-06-05 MED ORDER — HEPARIN (PORCINE) IN NACL 1000-0.9 UT/500ML-% IV SOLN
INTRAVENOUS | Status: DC | PRN
  Administered 2022-06-05 (×2): 500 mL

## 2022-06-05 MED ORDER — VERAPAMIL HCL 2.5 MG/ML IV SOLN
INTRAVENOUS | Status: DC | PRN
  Administered 2022-06-05: 10 mL via INTRA_ARTERIAL

## 2022-06-05 MED ORDER — CLOPIDOGREL BISULFATE 75 MG PO TABS: 75.0000 mg | ORAL_TABLET | Freq: Every day | ORAL | 11 refills | Status: DC

## 2022-06-05 MED ORDER — ONDANSETRON HCL 4 MG/2ML IJ SOLN: 4.0000 mg | Freq: Four times a day (QID) | INTRAMUSCULAR | Status: DC | PRN

## 2022-06-05 MED ORDER — FENTANYL CITRATE (PF) 100 MCG/2ML IJ SOLN
INTRAMUSCULAR | Status: AC
  Filled 2022-06-05: qty 2

## 2022-06-05 MED ORDER — ACETAMINOPHEN 325 MG PO TABS: 650.0000 mg | ORAL_TABLET | ORAL | Status: DC | PRN

## 2022-06-05 MED ORDER — IOPAMIDOL (ISOVUE-370) INJECTION 76%
INTRAVENOUS | Status: DC | PRN
  Administered 2022-06-05: 74 mL

## 2022-06-05 MED ORDER — SODIUM CHLORIDE 0.9 % WEIGHT BASED INFUSION
3.0000 mL/kg/h | INTRAVENOUS | Status: AC
  Administered 2022-06-05: 3 mL/kg/h via INTRAVENOUS

## 2022-06-05 MED ORDER — SODIUM CHLORIDE 0.9 % IV SOLN: INTRAVENOUS | Status: DC

## 2022-06-05 MED ORDER — VERAPAMIL HCL 2.5 MG/ML IV SOLN
INTRAVENOUS | Status: AC
  Filled 2022-06-05: qty 2

## 2022-06-05 MED ORDER — LIDOCAINE HCL (PF) 1 % IJ SOLN
INTRAMUSCULAR | Status: DC | PRN
  Administered 2022-06-05: 2 mL

## 2022-06-05 MED ORDER — HEPARIN (PORCINE) IN NACL 1000-0.9 UT/500ML-% IV SOLN
INTRAVENOUS | Status: AC
  Filled 2022-06-05: qty 500

## 2022-06-05 MED ORDER — HYDRALAZINE HCL 20 MG/ML IJ SOLN: 10.0000 mg | INTRAMUSCULAR | Status: DC | PRN

## 2022-06-05 MED ORDER — FENTANYL CITRATE (PF) 100 MCG/2ML IJ SOLN
INTRAMUSCULAR | Status: DC | PRN
  Administered 2022-06-05: 50 ug via INTRAVENOUS

## 2022-06-05 MED ORDER — LABETALOL HCL 5 MG/ML IV SOLN: 10.0000 mg | INTRAVENOUS | Status: DC | PRN

## 2022-06-05 MED ORDER — SODIUM CHLORIDE 0.9 % IV SOLN: 250.0000 mL | INTRAVENOUS | Status: DC | PRN

## 2022-06-05 MED ORDER — MIDAZOLAM HCL 2 MG/2ML IJ SOLN
INTRAMUSCULAR | Status: AC
  Filled 2022-06-05: qty 2

## 2022-06-05 MED ORDER — METOPROLOL SUCCINATE ER 25 MG PO TB24: 25.0000 mg | ORAL_TABLET | Freq: Every day | ORAL | 11 refills | Status: DC

## 2022-06-05 MED ORDER — HEPARIN SODIUM (PORCINE) 1000 UNIT/ML IJ SOLN
INTRAMUSCULAR | Status: AC
  Filled 2022-06-05: qty 10

## 2022-06-05 MED ORDER — SODIUM CHLORIDE 0.9% FLUSH: 3.0000 mL | Freq: Two times a day (BID) | INTRAVENOUS | Status: DC

## 2022-06-05 MED ORDER — HEPARIN SODIUM (PORCINE) 1000 UNIT/ML IJ SOLN
INTRAMUSCULAR | Status: DC | PRN
  Administered 2022-06-05: 4500 [IU] via INTRAVENOUS

## 2022-06-05 MED ORDER — LIDOCAINE HCL (PF) 1 % IJ SOLN
INTRAMUSCULAR | Status: AC
  Filled 2022-06-05: qty 30

## 2022-06-05 MED ORDER — OXYCODONE HCL 5 MG PO TABS: 5.0000 mg | ORAL_TABLET | ORAL | Status: DC | PRN

## 2022-06-05 MED ORDER — MIDAZOLAM HCL 2 MG/2ML IJ SOLN
INTRAMUSCULAR | Status: DC | PRN
  Administered 2022-06-05 (×2): 1 mg via INTRAVENOUS

## 2022-06-05 MED ORDER — ASPIRIN 81 MG PO CHEW: 81.0000 mg | CHEWABLE_TABLET | ORAL | Status: DC

## 2022-06-05 SURGICAL SUPPLY — 10 items
CATH 5FR JL3.5 JR4 ANG PIG MP (CATHETERS) IMPLANT
DEVICE RAD COMP TR BAND LRG (VASCULAR PRODUCTS) IMPLANT
GLIDESHEATH SLEND A-KIT 6F 22G (SHEATH) IMPLANT
GUIDEWIRE INQWIRE 1.5J.035X260 (WIRE) IMPLANT
INQWIRE 1.5J .035X260CM (WIRE) ×1
KIT HEART LEFT (KITS) ×1 IMPLANT
PACK CARDIAC CATHETERIZATION (CUSTOM PROCEDURE TRAY) ×1 IMPLANT
SHEATH PROBE COVER 6X72 (BAG) IMPLANT
TRANSDUCER W/STOPCOCK (MISCELLANEOUS) ×1 IMPLANT
TUBING CIL FLEX 10 FLL-RA (TUBING) ×1 IMPLANT

## 2022-06-05 NOTE — Interval H&P Note (Signed)
Cath Lab Visit (complete for each Cath Lab visit)  Clinical Evaluation Leading to the Procedure:   ACS: No.  Non-ACS:    Anginal Classification: CCS II  Anti-ischemic medical therapy: Minimal Therapy (1 class of medications)  Non-Invasive Test Results: Intermediate-risk stress test findings: cardiac mortality 1-3%/year  Prior CABG: No previous CABG      History and Physical Interval Note:  06/05/2022 7:24 AM  Gilbert Thompson  has presented today for surgery, with the diagnosis of cad.  The various methods of treatment have been discussed with the patient and family. After consideration of risks, benefits and other options for treatment, the patient has consented to  Procedure(s): LEFT HEART CATH AND CORONARY ANGIOGRAPHY (N/A) as a surgical intervention.  The patient's history has been reviewed, patient examined, no change in status, stable for surgery.  I have reviewed the patient's chart and labs.  Questions were answered to the patient's satisfaction.     Belva Crome III

## 2022-06-05 NOTE — CV Procedure (Signed)
Tandem 70 to 80% stenoses in the mid LAD.  Otherwise clean coronary arteries.  LAD wraps around the left ventricular apex.  The segment is heavily calcified. Recommend instituting preventative therapy and anti-ischemic therapy.  If the patient is still symptomatic, LAD could be treated with shockwave lithotripsy followed by placing a long stent in the mid segment however the stent would jail for moderate sidebranches.

## 2022-06-05 NOTE — Discharge Instructions (Signed)

## 2022-06-05 NOTE — Progress Notes (Signed)
Patient was given discharge instructions. He verbalized understanding. 

## 2022-06-05 NOTE — Progress Notes (Signed)
TR BAND REMOVAL  LOCATION:    right radial  DEFLATED PER PROTOCOL:    Yes.    TIME BAND OFF / DRESSING APPLIED:    1030   SITE UPON ARRIVAL:    Level 0  SITE AFTER BAND REMOVAL:    Level 0  CIRCULATION SENSATION AND MOVEMENT:    Within Normal Limits   Yes.    COMMENTS:   Patient started bleeding and bleeding was stopped.

## 2022-06-17 ENCOUNTER — Ambulatory Visit: Payer: Medicare HMO | Attending: Nurse Practitioner | Admitting: Cardiology

## 2022-06-17 ENCOUNTER — Encounter: Payer: Self-pay | Admitting: Cardiology

## 2022-06-17 VITALS — BP 118/60 | HR 79 | Ht 69.0 in | Wt 192.8 lb

## 2022-06-17 DIAGNOSIS — I10 Essential (primary) hypertension: Secondary | ICD-10-CM

## 2022-06-17 DIAGNOSIS — E785 Hyperlipidemia, unspecified: Secondary | ICD-10-CM

## 2022-06-17 DIAGNOSIS — I2510 Atherosclerotic heart disease of native coronary artery without angina pectoris: Secondary | ICD-10-CM

## 2022-06-17 NOTE — Patient Instructions (Addendum)
STOP TRIAM/HCTZ    Follow-Up: At [facility], you and your health needs are our priority.  As part of our continuing mission to provide you with exceptional heart care, we have created designated Provider Care Teams.  These Care Teams include your primary Cardiologist (physician) and Advanced Practice Providers (APPs -  Physician Assistants and Nurse Practitioners) who all work together to provide you with the care you need, when you need it.  We recommend signing up for the patient portal called "MyChart".  Sign up information is provided on this After Visit Summary.  MyChart is used to connect with patients for Virtual Visits (Telemedicine).  Patients are able to view lab/test results, encounter notes, upcoming appointments, etc.  Non-urgent messages can be sent to your provider as well.   To learn more about what you can do with MyChart, go to [email or web address].    Your next appointment:   3 month(s)  The format for your next appointment:   In Person  Provider:   Kirk Ruths, MD

## 2022-06-18 ENCOUNTER — Other Ambulatory Visit: Payer: Self-pay | Admitting: Family Medicine

## 2022-06-18 DIAGNOSIS — R072 Precordial pain: Secondary | ICD-10-CM

## 2022-06-18 DIAGNOSIS — R490 Dysphonia: Secondary | ICD-10-CM

## 2022-06-22 ENCOUNTER — Ambulatory Visit
Admission: RE | Admit: 2022-06-22 | Discharge: 2022-06-22 | Disposition: A | Discharge status: Home / Self Care | Payer: Medicare HMO | Source: Ambulatory Visit | Attending: Family Medicine | Admitting: Family Medicine

## 2022-06-22 ENCOUNTER — Ambulatory Visit: Payer: Medicare HMO | Admitting: Nurse Practitioner

## 2022-06-22 DIAGNOSIS — R12 Heartburn: Secondary | ICD-10-CM | POA: Diagnosis not present

## 2022-06-22 DIAGNOSIS — R490 Dysphonia: Secondary | ICD-10-CM

## 2022-06-22 DIAGNOSIS — R072 Precordial pain: Secondary | ICD-10-CM

## 2022-06-23 ENCOUNTER — Other Ambulatory Visit: Payer: Self-pay | Admitting: Family Medicine

## 2022-06-23 DIAGNOSIS — R109 Unspecified abdominal pain: Secondary | ICD-10-CM

## 2022-06-26 ENCOUNTER — Ambulatory Visit
Admission: RE | Admit: 2022-06-26 | Discharge: 2022-06-26 | Disposition: A | Discharge status: Home / Self Care | Payer: Medicare HMO | Source: Ambulatory Visit | Attending: Family Medicine | Admitting: Family Medicine

## 2022-06-26 DIAGNOSIS — R109 Unspecified abdominal pain: Secondary | ICD-10-CM

## 2022-06-26 DIAGNOSIS — R140 Abdominal distension (gaseous): Secondary | ICD-10-CM | POA: Diagnosis not present

## 2022-06-26 DIAGNOSIS — M4316 Spondylolisthesis, lumbar region: Secondary | ICD-10-CM | POA: Diagnosis not present

## 2022-06-26 DIAGNOSIS — K6389 Other specified diseases of intestine: Secondary | ICD-10-CM | POA: Diagnosis not present

## 2022-06-26 MED ORDER — IOPAMIDOL (ISOVUE-300) INJECTION 61%
100.0000 mL | Freq: Once | INTRAVENOUS | Status: AC | PRN
  Administered 2022-06-26: 100 mL via INTRAVENOUS

## 2022-07-09 ENCOUNTER — Telehealth: Payer: Self-pay | Admitting: Internal Medicine

## 2022-07-09 NOTE — Telephone Encounter (Signed)
Hi Dr. Hilarie Fredrickson,  We received an urgent referral for patient to be evaluated, he needs an opinion about abnormal finding on recent abdominal/pelvic CT. The patient has GI history with Dr. Earlean Shawl. Additional information was provided with the referral for you to review.  Please advise,  Thanks

## 2022-07-14 ENCOUNTER — Encounter: Payer: Self-pay | Admitting: Physician Assistant

## 2022-07-14 NOTE — Telephone Encounter (Signed)
Dr. Deboraha Sprang office calling to follow up on Urgent request for this patient.  Please advise when possible,  Thank you

## 2022-07-14 NOTE — Telephone Encounter (Signed)
[place] for appt with me or APP

## 2022-07-29 ENCOUNTER — Ambulatory Visit: Payer: Medicare HMO | Admitting: Physician Assistant

## 2022-07-29 ENCOUNTER — Ambulatory Visit (INDEPENDENT_AMBULATORY_CARE_PROVIDER_SITE_OTHER)
Admission: RE | Admit: 2022-07-29 | Discharge: 2022-07-29 | Disposition: A | Discharge status: Home / Self Care | Payer: Medicare HMO | Source: Ambulatory Visit | Attending: Physician Assistant | Admitting: Physician Assistant

## 2022-07-29 ENCOUNTER — Encounter: Payer: Self-pay | Admitting: Physician Assistant

## 2022-07-29 ENCOUNTER — Telehealth: Payer: Self-pay

## 2022-07-29 VITALS — BP 116/60 | HR 67 | Ht 69.0 in | Wt 193.4 lb

## 2022-07-29 DIAGNOSIS — R198 Other specified symptoms and signs involving the digestive system and abdomen: Secondary | ICD-10-CM | POA: Diagnosis not present

## 2022-07-29 DIAGNOSIS — K219 Gastro-esophageal reflux disease without esophagitis: Secondary | ICD-10-CM

## 2022-07-29 DIAGNOSIS — Z86010 Personal history of colonic polyps: Secondary | ICD-10-CM

## 2022-07-29 DIAGNOSIS — R935 Abnormal findings on diagnostic imaging of other abdominal regions, including retroperitoneum: Secondary | ICD-10-CM

## 2022-07-29 MED ORDER — OMEPRAZOLE 20 MG PO CPDR: 20.0000 mg | DELAYED_RELEASE_CAPSULE | Freq: Two times a day (BID) | ORAL | 2 refills | Status: DC

## 2022-07-29 NOTE — Telephone Encounter (Signed)
     Primary Cardiologist: Kirk Ruths, MD  Chart reviewed as part of pre-operative protocol coverage. Given past medical history and time since last visit, based on ACC/AHA guidelines, Gilbert Thompson would be at acceptable risk for the planned procedure without further cardiovascular testing.   His Plavix may be held for 5 days prior to his procedure.  Please resume as soon as hemostasis is achieved  I will route this recommendation to the requesting party via Epic fax function and remove from pre-op pool.  Please call with questions.  [name] Janele Lague NP-C     07/29/2022, 3:59 PM [address] Office [phone] Fax [phone]

## 2022-07-29 NOTE — Telephone Encounter (Signed)
Request for surgical clearance:     Endoscopy Procedure  What type of surgery is being performed?     EGD  When is this surgery scheduled?     09/08/2022  What type of clearance is required ?   Pharmacy  Are there any medications that need to be held prior to surgery and how long? Plavix x5 days prior to EGD  Practice name and name of physician performing surgery?      [facility]  What is your office phone and fax number?      Phone- [phone]  [phone]  Anesthesia type (None, local, MAC, general) ?       MAC

## 2022-07-29 NOTE — Progress Notes (Addendum)
Subjective:    Patient ID: Gilbert Thompson, male    DOB: 01/21/1951, 71 y.o.   MRN: [id number]  HPI  Gilbert Thompson is a pleasant 71 year old white male, new to GI today and had requested transfer of care to Dr. Hilarie Thompson.  Former patient of Dr. Earlean Thompson.  He is referred by Dr. Derinda Thompson for evaluation of upper abdominal sensation of pressure or "pushing up", general sense of feeling poorly every day on awakening. He has history of chronic GERD, has been on low-dose omeprazole 20 mg p.o. twice daily long-term.  He has not had prior EGD. He has fairly recent diagnosis of coronary artery disease, had undergone cardiac cath after he had complained of some exertional dyspnea and vague chest tightness.  He underwent CTA with elevated calcium score, moderate to severe atherosclerosis of the mid and distal LAD and otherwise mild nonobstructive disease. Echo at that time showed EF of 60 to 65%, normal LV function, left ventricular hypertrophy, no AS. Cardiac cath September 2023 with finding of tandem proximal to mid LAD lesions  of 75% and 70%.  Medical therapy recommended with high risk PCI if symptoms unable to be controlled.  At cardiology follow-up he continued to complain of "not doing well".  No change in mild exertional dyspnea, says he is able to walk [place] has some mild exertional dyspnea but no chest pain.  He has reported some sensation of discomfort in the epigastric area intermittently that pushes up towards his chest which she is unable to be more specific about.  He is not having any heartburn or indigestion, no dysphagia or odynophagia.  Appetite has been okay, no nausea or vomiting.  He does not relate the symptoms to eating or occurring postprandially though has noted some increase in belching and burping. Specifically says that he usually feels okay once he is up and around during the day but every morning he tends to wake up early around 4 AM and just generally feels poorly for  the first hour or so which she says is hard to describe but some sense of weakness.  He has had some intermittent dizziness long-term and that has not changed.  He had undergone barium swallow in October 2023 which showed normal esophageal motility, no hiatal hernia there was transient hold-up of the tablet at the GE junction but no stricture noted. He then had CT of the abdomen pelvis on 06/26/2022 which showed no significant abnormalities other than mild atherosclerotic changes of the aorta and branches, and there were multiple metallic densities noted within the lumen of the large bowel and the appendix, appendix appears normal.. He reports having multiple labs per Dr. Sandi Thompson recently all unrevealing..  No new meds, supplements or foreign body ingestion that he is aware of.  He is on Plavix and aspirin since September.  Last colonoscopy 2019/Dr. Medoff 2 tubular adenomas, one 8 mm and one 3 to 4 mm removed.   Review of Systems Pertinent positive and negative review of systems were noted in the above HPI section.  All other review of systems was otherwise negative.   Outpatient Encounter Medications as of 07/29/2022  Medication Sig   albuterol (VENTOLIN HFA) 108 (90 Base) MCG/ACT inhaler Inhale 1-2 puffs into the lungs every 6 (six) hours as needed for wheezing or shortness of breath.   allopurinol (ZYLOPRIM) 300 MG tablet Take 300 mg by mouth daily.   ALPRAZolam (XANAX) 1 MG tablet Take 1 mg by mouth at bedtime  as needed for anxiety or sleep.   aspirin EC 81 MG tablet Take 1 tablet (81 mg total) by mouth daily. Swallow whole.   cholecalciferol (VITAMIN D3) 25 MCG (1000 UNIT) tablet Take 1,000 Units by mouth daily.   clopidogrel (PLAVIX) 75 MG tablet Take 1 tablet (75 mg total) by mouth daily.   finasteride (PROSCAR) 5 MG tablet 1.25 mg daily.   HYDROcodone-acetaminophen (NORCO/VICODIN) 5-325 MG tablet Take 1 tablet by mouth every 6 (six) hours as needed for severe pain.   losartan (COZAAR)  50 MG tablet Take 50 mg by mouth daily.   metoprolol succinate (TOPROL XL) 25 MG 24 hr tablet Take 1 tablet (25 mg total) by mouth daily.   nitroGLYCERIN (NITROSTAT) 0.4 MG SL tablet Place 1 tablet (0.4 mg total) under the tongue every 5 (five) minutes as needed for chest pain.   rosuvastatin (CRESTOR) 40 MG tablet Take 40 mg by mouth daily.   TRELEGY ELLIPTA 100-62.5-25 MCG/ACT AEPB Inhale 1 puff into the lungs daily.   [DISCONTINUED] omeprazole (PRILOSEC) 20 MG capsule Take 20 mg by mouth 2 (two) times daily.   omeprazole (PRILOSEC) 20 MG capsule Take 1 capsule (20 mg total) by mouth 2 (two) times daily.   Facility-Administered Encounter Medications as of 07/29/2022  Medication   sodium chloride flush (NS) 0.9 % injection 3 mL   No Known Allergies Patient Active Problem List   Diagnosis Date Noted   Coronary artery disease involving native coronary artery of native heart without angina pectoris    DOE (dyspnea on exertion)    Social History   Socioeconomic History   Marital status: Married    Spouse name: Not on file   Number of children: 1   Years of education: Not on file   Highest education level: Not on file  Occupational History   Not on file  Tobacco Use   Smoking status: Never   Smokeless tobacco: Never  Substance and Sexual Activity   Alcohol use: Yes    Comment: 2 drinks per day   Drug use: Not on file   Sexual activity: Not on file  Other Topics Concern   Not on file  Social History Narrative   Not on file   Social Determinants of Health   Financial Resource Strain: Not on file  Food Insecurity: Not on file  Transportation Needs: Not on file  Physical Activity: Not on file  Stress: Not on file  Social Connections: Not on file  Intimate Partner Violence: Not on file    Mr. Gilbert Thompson's family history includes Coronary artery disease in his brother and father.      Objective:    Vitals:   07/29/22 1322  BP: 116/60  Pulse: 67    Physical Exam  Well-developed well-nourished older WM in no acute distress.  Height, Weight, 193 BMI 28.5  HEENT; nontraumatic normocephalic, EOMI, PE R LA, sclera anicteric. Oropharynx;not done  Neck; supple, no JVD Cardiovascular; regular rate and rhythm with S1-S2, no murmur rub or gallop Pulmonary; Clear bilaterally Abdomen; soft, nontender, nondistended, no palpable mass or hepatosplenomegaly, bowel sounds are active Rectal;not done Skin; benign exam, no jaundice rash or appreciable lesions Extremities; no clubbing cyanosis or edema skin warm and dry Neuro/Psych; alert and oriented x4, grossly nonfocal mood and affect appropriate        Assessment & Plan:   #99 71 year old white male with several month history of mild exertional dyspnea, and intermittent sensation of upper abdominal/epigastric fullness, and a "pushing up"  into the chest sensation Has  also noticed some increased belching and burping, denies gas and bloating. Does have history of chronic GERD, has been on 20 mg omeprazole twice daily long-term  Recent barium swallow unremarkable other than transient hold-up of tablet at the GE junction without stricture  Recent CT abdomen and pelvis with contrast unremarkable other than noting several metallic densities in the large bowel and at the appendix of unclear etiology.  Etiology of the above symptoms is not clear, not certain that this is secondary to any underlying GI pathology, though needs to be considered, rule out gastropathy, gastric lesion.  Symptoms secondary to GERD.  #2 coronary artery disease-recent cath September 2023 with tandem proximal to mid LAD lesions 75 and 70% obstruction-medical management recommended as would be high risk PCI wearing long stent etc.  With mild exertional dyspnea and vague epigastric symptoms, consider cardiac etiology  #3 hyperlipidemia on statin #4.  BPH #5.  Hypertension #6 antiplatelet therapy-Plavix and aspirin #7 history of adenomatous  colon polyps-last colon 2019 2 tubular adenomas both less than 10 mm/Dr. Medoff  #11 abnormal CT of the abdomen with unspecified tiny metallic densities noted in the colon lumen and appendix  Plan; For now continue omeprazole 20 mg p.o. twice daily AC breakfast and AC dinner Consider Gas-X with meals as trial Will check plain abd films today to  re-evaluate metallic densities noted on CT Patient will be scheduled for upper endoscopy with Dr. Hilarie Thompson.  Procedure was discussed in detail with the patient including indications risk and benefits and he is agreeable to proceed. Plavix will need to be held for 5 days prior to procedure, he can continue baby aspirin.  We will communicate with his cardiologist Dr. Stanford Breed for cardiac clearance and to ensure holding Plavix is reasonable for this patient.  After review of the labs  we received from Dr. Kandra Nicolas like to add, sed rate, CRP, CK CBC and c-Met    Kymber Kosar S Mikie Misner PA-C 07/29/2022   Cc: Gilbert Late, MD

## 2022-07-29 NOTE — Patient Instructions (Addendum)
We have sent the following medications to your pharmacy for you to pick up at your convenience: Omeprazole  You will be contaced by our office prior to your procedure for directions on holding your Plavix.  If you do not hear from our office 1 week prior to your scheduled procedure, please call [phone] to discuss.   Your provider has requested that you have an abdominal x ray before leaving today. Please go to the basement floor to our Radiology department for the test.  You have been scheduled for an endoscopy. Please follow written instructions given to you at your visit today. If you use inhalers (even only as needed), please bring them with you on the day of your procedure.  Try over the counter Gas -X to help with gas symptoms.   _______________________________________________________  If you are age 72 or older, your body mass index should be between 23-30. Your Body mass index is 28.56 kg/m. If this is out of the aforementioned range listed, please consider follow up with your Primary Care Provider.  If you are age 109 or younger, your body mass index should be between 19-25. Your Body mass index is 28.56 kg/m. If this is out of the aformentioned range listed, please consider follow up with your Primary Care Provider.   ________________________________________________________  The [facility] providers would like to encourage you to use [facility] to communicate with providers for non-urgent requests or questions.  Due to long hold times on the telephone, sending your provider a message by [facility] may be a faster and more efficient way to get a response.  Please allow 48 business hours for a response.  Please remember that this is for non-urgent requests.  _______________________________________________________  Thank you for choosing me and [facility].  Amy Esterwood PA-C

## 2022-07-31 ENCOUNTER — Other Ambulatory Visit: Payer: Self-pay

## 2022-07-31 DIAGNOSIS — R935 Abnormal findings on diagnostic imaging of other abdominal regions, including retroperitoneum: Secondary | ICD-10-CM

## 2022-08-03 ENCOUNTER — Telehealth: Payer: Self-pay | Admitting: Internal Medicine

## 2022-08-03 ENCOUNTER — Telehealth: Payer: Self-pay

## 2022-08-03 ENCOUNTER — Other Ambulatory Visit (INDEPENDENT_AMBULATORY_CARE_PROVIDER_SITE_OTHER): Payer: Medicare HMO

## 2022-08-03 DIAGNOSIS — R935 Abnormal findings on diagnostic imaging of other abdominal regions, including retroperitoneum: Secondary | ICD-10-CM | POA: Diagnosis not present

## 2022-08-03 LAB — COMPREHENSIVE METABOLIC PANEL
ALT: 13 U/L (ref 0–53)
AST: 17 U/L (ref 0–37)
Albumin: 4.3 g/dL (ref 3.5–5.2)
Alkaline Phosphatase: 47 U/L (ref 39–117)
BUN: 28 mg/dL — ABNORMAL HIGH (ref 6–23)
CO2: 27 mEq/L (ref 19–32)
Calcium: 9.5 mg/dL (ref 8.4–10.5)
Chloride: 103 mEq/L (ref 96–112)
Creatinine, Ser: 1.21 mg/dL (ref 0.40–1.50)
GFR: 59.72 mL/min — ABNORMAL LOW (ref >60.00)
Glucose, Bld: 101 mg/dL — ABNORMAL HIGH (ref 70–99)
Potassium: 4 mEq/L (ref 3.5–5.1)
Sodium: 137 mEq/L (ref 135–145)
Total Bilirubin: 0.4 mg/dL (ref 0.2–1.2)
Total Protein: 6.9 g/dL (ref 6.0–8.3)

## 2022-08-03 LAB — TSH: TSH: 2.03 u[IU]/mL (ref 0.35–5.50)

## 2022-08-03 LAB — SEDIMENTATION RATE: Sed Rate: 14 mm/hr (ref 0–20)

## 2022-08-03 LAB — CK: Total CK: 47 U/L (ref 7–232)

## 2022-08-03 NOTE — Telephone Encounter (Signed)
Yes thanks so much I didn't look down far enough. I just sent the pt a message to hold the plavix for 5 days.

## 2022-08-03 NOTE — Telephone Encounter (Signed)
I see documentation from 07/29/22 done by [facility]. Is that what we art looking for?

## 2022-08-03 NOTE — Telephone Encounter (Signed)
Pt sent mychart message, see message.

## 2022-08-03 NOTE — Progress Notes (Signed)
Addendum: Reviewed and agree with assessment and management plan. Keia Rask M, MD  

## 2022-08-03 NOTE — Telephone Encounter (Signed)
Patient came into the office, wanting to see if we could reschedule his EGD for a sooner date, he said he had an appointment with a doctor a day after his procedure and had stated they could get it expedited. I told him a nurse might be able to help him out with that. Also said he preferred an earlier appointment if available.

## 2022-08-03 NOTE — Telephone Encounter (Signed)
Pt called and requested a sooner appt. EGD moved to 08/20/22 with Dr. Hilarie Fredrickson in the [place]. It looks like you were working with this pt Gilbert Thompson. Have you gotten clearance for him to hold his Plavix?

## 2022-08-03 NOTE — Telephone Encounter (Signed)
Patient also stated that a MyChart message would be a better form of communication then calling him

## 2022-08-18 NOTE — Progress Notes (Signed)
HPI: Follow-up coronary artery disease and dyspnea.  Coronary CTA August 2023 showed calcium score 620 which was 74th percentile, moderate to severe stenosis in the mid LAD with otherwise mild nonobstructive coronary disease; there was note of 4 mm pulmonary nodule in the right middle lobe.  FFR abnormal in the LAD distribution.  Echocardiogram August 2023 showed normal LV function, severe left ventricular hypertrophy, grade 1 diastolic dysfunction, mild mitral regurgitation, trace aortic insufficiency.  Cardiac catheterization performed September 2023 showed 75% followed by 70% lesions and left ventricular end-diastolic pressure 10.  Medical therapy recommended with high risk PCI if symptoms cannot be controlled.  Since last seen the patient has dyspnea with more extreme activities but not with routine activities. It is relieved with rest. It is not associated with chest pain. There is no orthopnea, PND or pedal edema. There is no syncope or palpitations. There is no exertional chest pain.   Current Outpatient Medications  Medication Sig Dispense Refill   albuterol (VENTOLIN HFA) 108 (90 Base) MCG/ACT inhaler Inhale 1-2 puffs into the lungs every 6 (six) hours as needed for wheezing or shortness of breath.     allopurinol (ZYLOPRIM) 300 MG tablet Take 300 mg by mouth daily.     ALPRAZolam (XANAX) 1 MG tablet Take 1 mg by mouth at bedtime as needed for anxiety or sleep.     aspirin EC 81 MG tablet Take 1 tablet (81 mg total) by mouth daily. Swallow whole. 90 tablet 3   cholecalciferol (VITAMIN D3) 25 MCG (1000 UNIT) tablet Take 1,000 Units by mouth daily.     clopidogrel (PLAVIX) 75 MG tablet Take 1 tablet (75 mg total) by mouth daily. 30 tablet 11   finasteride (PROSCAR) 5 MG tablet 1.25 mg daily.     HYDROcodone-acetaminophen (NORCO/VICODIN) 5-325 MG tablet Take 1 tablet by mouth every 6 (six) hours as needed for severe pain.     losartan (COZAAR) 50 MG tablet Take 50 mg by mouth daily.      metoprolol succinate (TOPROL XL) 25 MG 24 hr tablet Take 1 tablet (25 mg total) by mouth daily. 30 tablet 11   nitroGLYCERIN (NITROSTAT) 0.4 MG SL tablet Place 1 tablet (0.4 mg total) under the tongue every 5 (five) minutes as needed for chest pain. 90 tablet 3   pantoprazole (PROTONIX) 40 MG tablet Take 1 tablet (40 mg total) by mouth daily. 90 tablet 3   rosuvastatin (CRESTOR) 40 MG tablet Take 40 mg by mouth daily.     TRELEGY ELLIPTA 100-62.5-25 MCG/ACT AEPB Inhale 1 puff into the lungs daily.     No current facility-administered medications for this visit.     Past Medical History:  Diagnosis Date   Asthma    Hyperlipidemia    Hypertension     Past Surgical History:  Procedure Laterality Date   LEFT HEART CATH AND CORONARY ANGIOGRAPHY N/A 06/05/2022   Procedure: LEFT HEART CATH AND CORONARY ANGIOGRAPHY;  Surgeon: Belva Crome, MD;  Location: [place] [facility];  Service: Cardiovascular;  Laterality: N/A;   UPPER GASTROINTESTINAL ENDOSCOPY      Social History   Socioeconomic History   Marital status: Married    Spouse name: Not on file   Number of children: 1   Years of education: Not on file   Highest education level: Not on file  Occupational History   Not on file  Tobacco Use   Smoking status: Never   Smokeless tobacco: Never  Vaping Use  Vaping Use: Never used  Substance and Sexual Activity   Alcohol use: Yes    Comment: 2 drinks per day   Drug use: Never   Sexual activity: Not on file  Other Topics Concern   Not on file  Social History Narrative   Not on file   Social Determinants of Health   Financial Resource Strain: Not on file  Food Insecurity: Not on file  Transportation Needs: Not on file  Physical Activity: Not on file  Stress: Not on file  Social Connections: Not on file  Intimate Partner Violence: Not on file    Family History  Problem Relation Age of Onset   Coronary artery disease Father    Coronary artery disease Brother    Colon  cancer Neg Hx    Esophageal cancer Neg Hx    Rectal cancer Neg Hx    Stomach cancer Neg Hx     ROS: no fevers or chills, productive cough, hemoptysis, dysphasia, odynophagia, melena, hematochezia, dysuria, hematuria, rash, seizure activity, orthopnea, PND, pedal edema, claudication. Remaining systems are negative.  Physical Exam: Well-developed well-nourished in no acute distress.  Skin is warm and dry.  HEENT is normal.  Neck is supple.  Chest is clear to auscultation with normal expansion.  Cardiovascular exam is regular rate and rhythm.  Abdominal exam nontender or distended. No masses palpated. Extremities show no edema. neuro grossly intact   A/P  1 coronary artery disease-continue aspirin, Plavix, Crestor and Toprol.  As outlined in previous notes cardiac catheterization revealed obstructive coronary disease.  PCI could be considered but only to improve symptoms; otherwise medical therapy recommended.  Continue medical therapy at this point as he is not having symptoms.  2 hypertension-triamterene hydrochlorothiazide discontinued at previous office visit as he was having orthostatic symptoms.  Blood pressure is elevated this morning but he follows this at home and it is typically controlled.  Continue present medications and follow-up.  3 hyperlipidemia-continue statin.  Check lipids and liver.  Kirk Ruths, MD

## 2022-08-20 ENCOUNTER — Ambulatory Visit (AMBULATORY_SURGERY_CENTER): Payer: Medicare HMO | Admitting: Internal Medicine

## 2022-08-20 ENCOUNTER — Encounter: Payer: Self-pay | Admitting: Internal Medicine

## 2022-08-20 VITALS — BP 126/65 | HR 67 | Temp 97.1°F | Resp 12 | Ht 69.0 in | Wt 193.0 lb

## 2022-08-20 DIAGNOSIS — I10 Essential (primary) hypertension: Secondary | ICD-10-CM | POA: Diagnosis not present

## 2022-08-20 DIAGNOSIS — E669 Obesity, unspecified: Secondary | ICD-10-CM | POA: Diagnosis not present

## 2022-08-20 DIAGNOSIS — I2510 Atherosclerotic heart disease of native coronary artery without angina pectoris: Secondary | ICD-10-CM | POA: Diagnosis not present

## 2022-08-20 DIAGNOSIS — R933 Abnormal findings on diagnostic imaging of other parts of digestive tract: Secondary | ICD-10-CM | POA: Diagnosis not present

## 2022-08-20 DIAGNOSIS — K2950 Unspecified chronic gastritis without bleeding: Secondary | ICD-10-CM | POA: Diagnosis not present

## 2022-08-20 DIAGNOSIS — K219 Gastro-esophageal reflux disease without esophagitis: Secondary | ICD-10-CM

## 2022-08-20 DIAGNOSIS — K209 Esophagitis, unspecified without bleeding: Secondary | ICD-10-CM | POA: Diagnosis not present

## 2022-08-20 MED ORDER — SODIUM CHLORIDE 0.9 % IV SOLN: 500.0000 mL | Freq: Once | INTRAVENOUS | Status: DC

## 2022-08-20 NOTE — Patient Instructions (Signed)
Handout provided on gastritis.  Resume previous diet.  Continue present medications.  Resume Plavix at prior dose today.  Refer to managing physician for further adjustment therapy.    YOU HAD AN ENDOSCOPIC PROCEDURE TODAY AT [facility]:   Refer to the procedure report that was given to you for any specific questions about what was found during the examination.  If the procedure report does not answer your questions, please call your gastroenterologist to clarify.  If you requested that your care partner not be given the details of your procedure findings, then the procedure report has been included in a sealed envelope for you to review at your convenience later.  YOU SHOULD EXPECT: Some feelings of bloating in the abdomen. Passage of more gas than usual.  Walking can help get rid of the air that was put into your GI tract during the procedure and reduce the bloating. If you had a lower endoscopy (such as a colonoscopy or flexible sigmoidoscopy) you may notice spotting of blood in your stool or on the toilet paper. If you underwent a bowel prep for your procedure, you may not have a normal bowel movement for a few days.  Please Note:  You might notice some irritation and congestion in your nose or some drainage.  This is from the oxygen used during your procedure.  There is no need for concern and it should clear up in a day or so.  SYMPTOMS TO REPORT IMMEDIATELY:  Following upper endoscopy (EGD)  Vomiting of blood or coffee ground material  New chest pain or pain under the shoulder blades  Painful or persistently difficult swallowing  New shortness of breath  Fever of 100F or higher  Black, tarry-looking stools  For urgent or emergent issues, a gastroenterologist can be reached at any hour by calling [phone]. Do not use MyChart messaging for urgent concerns.    DIET:  We do recommend a small meal at first, but then you may proceed to your regular diet.  Drink plenty  of fluids but you should avoid alcoholic beverages for 24 hours.  ACTIVITY:  You should plan to take it easy for the rest of today and you should NOT DRIVE or use heavy machinery until tomorrow (because of the sedation medicines used during the test).    FOLLOW UP: Our staff will call the number listed on your records the next business day following your procedure.  We will call around 7:15- 8:00 am to check on you and address any questions or concerns that you may have regarding the information given to you following your procedure. If we do not reach you, we will leave a message.     If any biopsies were taken you will be contacted by phone or by letter within the next 1-3 weeks.  Please call us at [phone] if you have not heard about the biopsies in 3 weeks.    SIGNATURES/CONFIDENTIALITY: You and/or your care partner have signed paperwork which will be entered into your electronic medical record.  These signatures attest to the fact that that the information above on your After Visit Summary has been reviewed and is understood.  Full responsibility of the confidentiality of this discharge information lies with you and/or your care-partner.

## 2022-08-20 NOTE — Progress Notes (Signed)
Report to PACU, RN, vss, BBS= Clear.  

## 2022-08-20 NOTE — Op Note (Signed)
[facility] Patient Name: Gilbert Thompson Procedure Date: 08/20/2022 2:05 PM MRN: [id number] Endoscopist: Jerene Bears , MD, [id number] Age: 71 Referring MD:  Date of Birth: Sep 02, 1951 Gender: Male Account #: [id number] Procedure:                Upper GI endoscopy Indications:              Gastro-esophageal reflux disease, upper abd pressure Medicines:                Monitored Anesthesia Care Procedure:                Pre-Anesthesia Assessment:                           - Prior to the procedure, a History and Physical                            was performed, and patient medications and                            allergies were reviewed. The patient's tolerance of                            previous anesthesia was also reviewed. The risks                            and benefits of the procedure and the sedation                            options and risks were discussed with the patient.                            All questions were answered, and informed consent                            was obtained. Prior Anticoagulants: The patient has                            taken Plavix (clopidogrel), last dose was 5 days                            prior to procedure. ASA Grade Assessment: III - A                            patient with severe systemic disease. After                            reviewing the risks and benefits, the patient was                            deemed in satisfactory condition to undergo the                            procedure.  After obtaining informed consent, the endoscope was                            passed under direct vision. Throughout the                            procedure, the patient's blood pressure, pulse, and                            oxygen saturations were monitored continuously. The                            Endoscope was introduced through the mouth, and                            advanced to the second part  of duodenum. The upper                            GI endoscopy was accomplished without difficulty.                            The patient tolerated the procedure well. Scope In: Scope Out: Findings:                 In the proximal esophagus there was a small inlet                            patch without nodularity or visible abnormality.                           The Z-line was irregular and was found 39 cm from                            the incisors. Biopsies were taken with a cold                            forceps for histology.                           The exam of the esophagus was otherwise normal.                           Localized mild inflammation characterized by                            erythema and granularity was found in the                            prepyloric region of the stomach. Biopsies were                            taken with a cold forceps for histology and  Helicobacter pylori testing.                           The exam of the stomach was otherwise normal.                           The examined duodenum was normal. Complications:            No immediate complications. Estimated Blood Loss:     Estimated blood loss was minimal. Impression:               - Z-line irregular, 39 cm from the incisors.                            Biopsied.                           - Mild gastritis. Biopsied to exclude H. Pylori                            infection.                           - Normal examined duodenum. Recommendation:           - Patient has a contact number available for                            emergencies. The signs and symptoms of potential                            delayed complications were discussed with the                            patient. Return to normal activities tomorrow.                            Written discharge instructions were provided to the                            patient.                           -  Resume previous diet.                           - Continue present medications.                           - Await pathology results.                           - Resume Plavix (clopidogrel) at prior dose today.                            Refer to managing physician for further adjustment  of therapy. Jerene Bears, MD 08/20/2022 2:33:51 PM This report has been signed electronically.

## 2022-08-20 NOTE — Progress Notes (Signed)
See office note dated 07/29/2022 for details and current H&P Plavix on hold x 5 days Patient remains appropriate for EGD in the [facility] today.

## 2022-08-20 NOTE — Progress Notes (Signed)
Called to room to assist during endoscopic procedure.  Patient ID and intended procedure confirmed with present staff. Received instructions for my participation in the procedure from the performing physician.  

## 2022-08-20 NOTE — Progress Notes (Signed)
Pt's states no medical or surgical changes since previsit or office visit. 

## 2022-08-21 ENCOUNTER — Telehealth: Payer: Self-pay

## 2022-08-21 NOTE — Telephone Encounter (Signed)
Left message on follow up call. 

## 2022-08-26 DIAGNOSIS — R498 Other voice and resonance disorders: Secondary | ICD-10-CM | POA: Diagnosis not present

## 2022-08-26 DIAGNOSIS — R490 Dysphonia: Secondary | ICD-10-CM | POA: Diagnosis not present

## 2022-08-26 DIAGNOSIS — J383 Other diseases of vocal cords: Secondary | ICD-10-CM | POA: Diagnosis not present

## 2022-08-27 ENCOUNTER — Other Ambulatory Visit: Payer: Self-pay

## 2022-08-27 ENCOUNTER — Encounter: Payer: Self-pay | Admitting: Internal Medicine

## 2022-08-27 MED ORDER — PANTOPRAZOLE SODIUM 40 MG PO TBEC: 40.0000 mg | DELAYED_RELEASE_TABLET | Freq: Every day | ORAL | 3 refills | Status: DC

## 2022-08-31 ENCOUNTER — Ambulatory Visit: Payer: Medicare HMO | Attending: Cardiology | Admitting: Cardiology

## 2022-08-31 ENCOUNTER — Encounter: Payer: Self-pay | Admitting: Cardiology

## 2022-08-31 VITALS — BP 148/70 | HR 73 | Ht 69.0 in | Wt 192.8 lb

## 2022-08-31 DIAGNOSIS — I2510 Atherosclerotic heart disease of native coronary artery without angina pectoris: Secondary | ICD-10-CM

## 2022-08-31 DIAGNOSIS — I10 Essential (primary) hypertension: Secondary | ICD-10-CM

## 2022-08-31 DIAGNOSIS — E7800 Pure hypercholesterolemia, unspecified: Secondary | ICD-10-CM

## 2022-08-31 DIAGNOSIS — E785 Hyperlipidemia, unspecified: Secondary | ICD-10-CM | POA: Diagnosis not present

## 2022-08-31 DIAGNOSIS — R972 Elevated prostate specific antigen [PSA]: Secondary | ICD-10-CM

## 2022-08-31 NOTE — Patient Instructions (Signed)
  Follow-Up: At [facility], you and your health needs are our priority.  As part of our continuing mission to provide you with exceptional heart care, we have created designated Provider Care Teams.  These Care Teams include your primary Cardiologist (physician) and Advanced Practice Providers (APPs -  Physician Assistants and Nurse Practitioners) who all work together to provide you with the care you need, when you need it.  We recommend signing up for the patient portal called "MyChart".  Sign up information is provided on this After Visit Summary.  MyChart is used to connect with patients for Virtual Visits (Telemedicine).  Patients are able to view lab/test results, encounter notes, upcoming appointments, etc.  Non-urgent messages can be sent to your provider as well.   To learn more about what you can do with MyChart, go to [email or web address].    Your next appointment:   3 month(s)  The format for your next appointment:   In Person  Provider:   Kirk Ruths, MD

## 2022-09-01 ENCOUNTER — Telehealth: Payer: Self-pay | Admitting: *Deleted

## 2022-09-01 DIAGNOSIS — E785 Hyperlipidemia, unspecified: Secondary | ICD-10-CM

## 2022-09-01 LAB — LIPID PANEL
Chol/HDL Ratio: 2 ratio (ref 0.0–5.0)
Cholesterol, Total: 178 mg/dL (ref 100–199)
HDL: 90 mg/dL (ref >39)
LDL Chol Calc (NIH): 77 mg/dL (ref 0–99)
Triglycerides: 56 mg/dL (ref 0–149)
VLDL Cholesterol Cal: 11 mg/dL (ref 5–40)

## 2022-09-01 LAB — HEPATIC FUNCTION PANEL
ALT: 14 IU/L (ref 0–44)
AST: 17 IU/L (ref 0–40)
Albumin: 4.3 g/dL (ref 3.8–4.8)
Alkaline Phosphatase: 65 IU/L (ref 44–121)
Bilirubin Total: 0.3 mg/dL (ref 0.0–1.2)
Bilirubin, Direct: 0.12 mg/dL (ref 0.00–0.40)
Total Protein: 6.6 g/dL (ref 6.0–8.5)

## 2022-09-01 LAB — PSA: Prostate Specific Ag, Serum: 3.4 ng/mL (ref 0.0–4.0)

## 2022-09-01 MED ORDER — EZETIMIBE 10 MG PO TABS: 10.0000 mg | ORAL_TABLET | Freq: Every day | ORAL | 3 refills | Status: DC

## 2022-09-01 NOTE — Telephone Encounter (Signed)
-----   Message from Lelon Perla, MD sent at 09/01/2022  7:25 AM EST ----- Add zetia 10 mg daily; lipids and liver 8 weeks Kirk Ruths

## 2022-09-01 NOTE — Telephone Encounter (Signed)
Pt has reviewed results via my chart  New script sent to the pharmacy  Lab orders mailed to the pt  

## 2022-09-02 ENCOUNTER — Encounter: Payer: Medicare HMO | Admitting: Internal Medicine

## 2022-09-08 ENCOUNTER — Encounter: Payer: Medicare HMO | Admitting: Internal Medicine

## 2022-09-28 ENCOUNTER — Other Ambulatory Visit: Payer: Self-pay | Admitting: Family Medicine

## 2022-09-28 ENCOUNTER — Ambulatory Visit
Admission: RE | Admit: 2022-09-28 | Discharge: 2022-09-28 | Disposition: A | Discharge status: Home / Self Care | Payer: Medicare HMO | Source: Ambulatory Visit | Attending: Family Medicine | Admitting: Family Medicine

## 2022-09-28 DIAGNOSIS — J209 Acute bronchitis, unspecified: Secondary | ICD-10-CM | POA: Diagnosis not present

## 2022-09-28 DIAGNOSIS — Z7184 Encounter for health counseling related to travel: Secondary | ICD-10-CM | POA: Diagnosis not present

## 2022-09-28 DIAGNOSIS — J4530 Mild persistent asthma, uncomplicated: Secondary | ICD-10-CM | POA: Diagnosis not present

## 2022-09-28 DIAGNOSIS — R972 Elevated prostate specific antigen [PSA]: Secondary | ICD-10-CM | POA: Diagnosis not present

## 2022-09-28 DIAGNOSIS — R059 Cough, unspecified: Secondary | ICD-10-CM | POA: Diagnosis not present

## 2022-09-28 DIAGNOSIS — I959 Hypotension, unspecified: Secondary | ICD-10-CM | POA: Diagnosis not present

## 2022-10-19 DIAGNOSIS — D2272 Melanocytic nevi of left lower limb, including hip: Secondary | ICD-10-CM | POA: Diagnosis not present

## 2022-10-19 DIAGNOSIS — D692 Other nonthrombocytopenic purpura: Secondary | ICD-10-CM | POA: Diagnosis not present

## 2022-10-19 DIAGNOSIS — D225 Melanocytic nevi of trunk: Secondary | ICD-10-CM | POA: Diagnosis not present

## 2022-10-19 DIAGNOSIS — L821 Other seborrheic keratosis: Secondary | ICD-10-CM | POA: Diagnosis not present

## 2022-10-19 DIAGNOSIS — D2271 Melanocytic nevi of right lower limb, including hip: Secondary | ICD-10-CM | POA: Diagnosis not present

## 2022-10-28 DIAGNOSIS — E785 Hyperlipidemia, unspecified: Secondary | ICD-10-CM | POA: Diagnosis not present

## 2022-10-28 LAB — HEPATIC FUNCTION PANEL
ALT: 23 IU/L (ref 0–44)
AST: 35 IU/L (ref 0–40)
Albumin: 4.3 g/dL (ref 3.8–4.8)
Alkaline Phosphatase: 61 IU/L (ref 44–121)
Bilirubin Total: 0.5 mg/dL (ref 0.0–1.2)
Bilirubin, Direct: 0.19 mg/dL (ref 0.00–0.40)
Total Protein: 6.5 g/dL (ref 6.0–8.5)

## 2022-10-28 LAB — LIPID PANEL
Chol/HDL Ratio: 1.5 ratio (ref 0.0–5.0)
Cholesterol, Total: 148 mg/dL (ref 100–199)
HDL: 98 mg/dL (ref >39)
LDL Chol Calc (NIH): 36 mg/dL (ref 0–99)
Triglycerides: 70 mg/dL (ref 0–149)
VLDL Cholesterol Cal: 14 mg/dL (ref 5–40)

## 2022-11-19 DIAGNOSIS — R490 Dysphonia: Secondary | ICD-10-CM | POA: Diagnosis not present

## 2022-11-19 DIAGNOSIS — J383 Other diseases of vocal cords: Secondary | ICD-10-CM | POA: Diagnosis not present

## 2022-11-25 DIAGNOSIS — J387 Other diseases of larynx: Secondary | ICD-10-CM | POA: Diagnosis not present

## 2022-11-25 DIAGNOSIS — R490 Dysphonia: Secondary | ICD-10-CM | POA: Diagnosis not present

## 2023-01-06 DIAGNOSIS — N402 Nodular prostate without lower urinary tract symptoms: Secondary | ICD-10-CM | POA: Diagnosis not present

## 2023-01-06 DIAGNOSIS — R972 Elevated prostate specific antigen [PSA]: Secondary | ICD-10-CM | POA: Diagnosis not present

## 2023-01-19 DIAGNOSIS — R972 Elevated prostate specific antigen [PSA]: Secondary | ICD-10-CM | POA: Diagnosis not present

## 2023-02-02 ENCOUNTER — Other Ambulatory Visit (HOSPITAL_COMMUNITY): Payer: Self-pay | Admitting: Urology

## 2023-02-02 DIAGNOSIS — C61 Malignant neoplasm of prostate: Secondary | ICD-10-CM

## 2023-02-08 DIAGNOSIS — C61 Malignant neoplasm of prostate: Secondary | ICD-10-CM | POA: Diagnosis not present

## 2023-02-18 NOTE — Progress Notes (Signed)
GU Location of Tumor / Histology: Prostate Ca  If Prostate Cancer, Gleason Score is (4 + 4) and PSA is (2.98 on 01/07/2023).  Biopsies       Past/Anticipated interventions by urology, if any: NA  Past/Anticipated interventions by medical oncology, if any: NA  Weight changes, if any:  No  IPSS:  3 SHIM:  8  Bowel/Bladder complaints, if any:  No  Nausea/Vomiting, if any:  No  Pain issues, if any:  0/10  SAFETY ISSUES: Prior radiation?  No Pacemaker/ICD? No Possible current pregnancy? Male Is the patient on methotrexate? No  Current Complaints / other details:  No

## 2023-02-22 ENCOUNTER — Ambulatory Visit (HOSPITAL_COMMUNITY)
Admission: RE | Admit: 2023-02-22 | Discharge: 2023-02-22 | Disposition: A | Discharge status: Home / Self Care | Payer: Medicare HMO | Source: Ambulatory Visit | Attending: Urology | Admitting: Urology

## 2023-02-22 DIAGNOSIS — Z125 Encounter for screening for malignant neoplasm of prostate: Secondary | ICD-10-CM | POA: Diagnosis not present

## 2023-02-22 DIAGNOSIS — C61 Malignant neoplasm of prostate: Secondary | ICD-10-CM | POA: Diagnosis not present

## 2023-02-22 MED ORDER — PIFLIFOLASTAT F 18 (PYLARIFY) INJECTION
9.0000 | Freq: Once | INTRAVENOUS | Status: AC
  Administered 2023-02-22: 9.9 via INTRAVENOUS

## 2023-02-23 NOTE — Progress Notes (Signed)
STAT read request placed for PSMA PET from 6/3 for upcoming consult on 6/5.

## 2023-02-24 ENCOUNTER — Encounter: Payer: Self-pay | Admitting: Radiation Oncology

## 2023-02-24 ENCOUNTER — Ambulatory Visit
Admission: RE | Admit: 2023-02-24 | Discharge: 2023-02-24 | Disposition: A | Discharge status: Home / Self Care | Payer: Medicare HMO | Source: Ambulatory Visit | Attending: Radiation Oncology | Admitting: Radiation Oncology

## 2023-02-24 ENCOUNTER — Other Ambulatory Visit: Payer: Self-pay

## 2023-02-24 VITALS — BP 124/63 | HR 52 | Temp 98.2°F | Resp 18 | Ht 69.0 in | Wt 194.0 lb

## 2023-02-24 DIAGNOSIS — C61 Malignant neoplasm of prostate: Secondary | ICD-10-CM

## 2023-02-24 DIAGNOSIS — Z191 Hormone sensitive malignancy status: Secondary | ICD-10-CM | POA: Diagnosis not present

## 2023-02-24 HISTORY — DX: Gastro-esophageal reflux disease without esophagitis: K21.9

## 2023-02-24 NOTE — Progress Notes (Signed)
Radiation Oncology         [phone] ________________________________  Initial Outpatient Consultation  Name: Gilbert Thompson MRN: [id number]  Date: 02/24/2023  DOB: April 28, 1951  WU:[id number], Theron Arista, MD  Heloise Purpura, MD   REFERRING PHYSICIAN: Heloise Purpura, MD  DIAGNOSIS: 72 y.o. 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    ICD-10-CM   1. Malignant neoplasm of prostate (HCC)  C61       HISTORY OF PRESENT ILLNESS: Gilbert Thompson is a 72 y.o. male with a diagnosis of prostate cancer. He was noted to have an elevated PSA of 2.98 (on finasteride)  by his primary care physician, Dr. Duaine Dredge.  Accordingly, he was referred for evaluation in urology by Dr. Laverle Patter on 01/06/23,  digital rectal examination was performed at that time revealing 1.5 cm right mid prostate nodule.  The patient proceeded to transrectal ultrasound with 14 biopsies of the prostate on 01/19/23.  The prostate volume measured 19.5 cc.  Out of 14 core biopsies, 5 were positive.  The maximum Gleason score was 4+4, and this was seen in the ROI #1 core. Additionally, Gleason score 3+4 was seen in the ROI #2 core, right base, right mid, and right mid lateral. PSMA PET on 02/22/23 showed two foci of intense radiotracer activity in the prostate gland with no evidence of metastatic disease.   The patient reviewed the biopsy results with his urologist and he has kindly been referred today for discussion of potential radiation treatment options.   PREVIOUS RADIATION THERAPY: No  PAST MEDICAL HISTORY:  Past Medical History:  Diagnosis Date   Asthma    GERD (gastroesophageal reflux disease)    Hyperlipidemia    Hypertension       PAST SURGICAL HISTORY: Past Surgical History:  Procedure Laterality Date   LEFT HEART CATH AND CORONARY ANGIOGRAPHY N/A 06/05/2022   Procedure: LEFT HEART CATH AND CORONARY ANGIOGRAPHY;  Surgeon: Lyn Records, MD;   Location: [facility];  Service: Cardiovascular;  Laterality: N/A;   PROSTATE BIOPSY     UPPER GASTROINTESTINAL ENDOSCOPY      FAMILY HISTORY:  Family History  Problem Relation Age of Onset   Coronary artery disease Father    Coronary artery disease Brother    Colon cancer Neg Hx    Esophageal cancer Neg Hx    Rectal cancer Neg Hx    Stomach cancer Neg Hx     SOCIAL HISTORY:  Social History   Socioeconomic History   Marital status: Married    Spouse name: Not on file   Number of children: 1   Years of education: Not on file   Highest education level: Not on file  Occupational History   Not on file  Tobacco Use   Smoking status: Never   Smokeless tobacco: Never  Vaping Use   Vaping Use: Never used  Substance and Sexual Activity   Alcohol use: Yes    Comment: 2 drinks per day   Drug use: Never   Sexual activity: Not on file  Other Topics Concern   Not on file  Social History Narrative   Not on file   Social Determinants of Health   Financial Resource Strain: Not on file  Food Insecurity: No Food Insecurity (02/24/2023)   Hunger Vital Sign    Worried About Running Out of Food in the Last Year: Never true    Ran Out of Food  in the Last Year: Never true  Transportation Needs: No Transportation Needs (02/24/2023)   PRAPARE - Administrator, Civil Service (Medical): No    Lack of Transportation (Non-Medical): No  Physical Activity: Not on file  Stress: Not on file  Social Connections: Not on file  Intimate Partner Violence: Not At Risk (02/24/2023)   Humiliation, Afraid, Rape, and Kick questionnaire    Fear of Current or Ex-Partner: No    Emotionally Abused: No    Physically Abused: No    Sexually Abused: No    ALLERGIES: Patient has no known allergies.  MEDICATIONS:  Current Outpatient Medications  Medication Sig Dispense Refill   albuterol (VENTOLIN HFA) 108 (90 Base) MCG/ACT inhaler Inhale 1-2 puffs into the lungs every 6 (six) hours as  needed for wheezing or shortness of breath.     allopurinol (ZYLOPRIM) 300 MG tablet Take 300 mg by mouth daily.     ALPRAZolam (XANAX) 1 MG tablet Take 1 mg by mouth at bedtime as needed for anxiety or sleep.     aspirin EC 81 MG tablet Take 1 tablet (81 mg total) by mouth daily. Swallow whole. 90 tablet 3   cholecalciferol (VITAMIN D3) 25 MCG (1000 UNIT) tablet Take 1,000 Units by mouth daily.     clopidogrel (PLAVIX) 75 MG tablet Take 1 tablet (75 mg total) by mouth daily. 30 tablet 11   ezetimibe (ZETIA) 10 MG tablet Take 1 tablet (10 mg total) by mouth daily. 90 tablet 3   finasteride (PROSCAR) 5 MG tablet 1.25 mg daily.     HYDROcodone-acetaminophen (NORCO/VICODIN) 5-325 MG tablet Take 1 tablet by mouth every 6 (six) hours as needed for severe pain.     losartan (COZAAR) 50 MG tablet Take 50 mg by mouth daily.     metoprolol succinate (TOPROL XL) 25 MG 24 hr tablet Take 1 tablet (25 mg total) by mouth daily. 30 tablet 11   nitroGLYCERIN (NITROSTAT) 0.4 MG SL tablet Place 1 tablet (0.4 mg total) under the tongue every 5 (five) minutes as needed for chest pain. 90 tablet 3   pantoprazole (PROTONIX) 40 MG tablet Take 1 tablet (40 mg total) by mouth daily. 90 tablet 3   rosuvastatin (CRESTOR) 40 MG tablet Take 40 mg by mouth daily.     TRELEGY ELLIPTA 100-62.5-25 MCG/ACT AEPB Inhale 1 puff into the lungs daily.     No current facility-administered medications for this encounter.    REVIEW OF SYSTEMS:  On review of systems, the patient reports that he is doing well overall. He denies any chest pain, shortness of breath, cough, fevers, chills, night sweats, unintended weight changes. He denies any bowel disturbances, and denies abdominal pain, nausea or vomiting. He denies any new musculoskeletal or joint aches or pains. His IPSS was 3, indicating mild urinary symptoms.  His SHIM was 8SHIM: 8, indicating he has moderate erectile dysfunction. A complete review of systems is obtained and is  otherwise negative.   PHYSICAL EXAM:  Wt Readings from Last 3 Encounters:  02/24/23 194 lb (88 kg)  08/31/22 192 lb 12.8 oz (87.5 kg)  08/20/22 193 lb (87.5 kg)   Temp Readings from Last 3 Encounters:  02/24/23 98.2 F (36.8 C) (Oral)  08/20/22 (!) 97.1 F (36.2 C) (Temporal)  06/05/22 98 F (36.7 C) (Oral)   BP Readings from Last 3 Encounters:  02/24/23 124/63  08/31/22 (!) 148/70  08/20/22 126/65   Pulse Readings from Last 3 Encounters:  02/24/23 Marland Kitchen)  52  08/31/22 73  08/20/22 67   Pain Assessment Pain Score: 0-No pain/10  In general this is a well appearing gentleman in no acute distress. He's alert and oriented x4 and appropriate throughout the examination. Cardiopulmonary assessment is negative for acute distress, and he exhibits normal effort.    KPS = 100  100 - Normal; no complaints; no evidence of disease. 90   - Able to carry on normal activity; minor signs or symptoms of disease. 80   - Normal activity with effort; some signs or symptoms of disease. 42   - Cares for self; unable to carry on normal activity or to do active work. 60   - Requires occasional assistance, but is able to care for most of his personal needs. 50   - Requires considerable assistance and frequent medical care. 40   - Disabled; requires special care and assistance. 30   - Severely disabled; hospital admission is indicated although death not imminent. 20   - Very sick; hospital admission necessary; active supportive treatment necessary. 10   - Moribund; fatal processes progressing rapidly. 0     - Dead  Karnofsky DA, Abelmann WH, Craver LS and Burchenal [facility] [phone]) The use of the nitrogen mustards in the palliative treatment of carcinoma: with particular reference to bronchogenic carcinoma Cancer 1 634-56  LABORATORY DATA:  Lab Results  Component Value Date   WBC 6.4 05/28/2022   HGB 13.5 05/28/2022   HCT 39.2 05/28/2022   MCV 92 05/28/2022   PLT 257 05/28/2022   Lab Results   Component Value Date   NA 137 08/03/2022   K 4.0 08/03/2022   CL 103 08/03/2022   CO2 27 08/03/2022   Lab Results  Component Value Date   ALT 23 10/28/2022   AST 35 10/28/2022   ALKPHOS 61 10/28/2022   BILITOT 0.5 10/28/2022     RADIOGRAPHY: NM PET (PSMA) SKULL TO MID THIGH  Result Date: 02/23/2023 CLINICAL DATA:  High risk prostate carcinoma. Gleason 4+4=8. PSA equal 2.9. EXAM: NUCLEAR MEDICINE PET SKULL BASE TO THIGH TECHNIQUE: 9.9 mCi F18 Piflufolastat (Pylarify) was injected intravenously. Full-ring PET imaging was performed from the skull base to thigh after the radiotracer. CT data was obtained and used for attenuation correction and anatomic localization. COMPARISON:  None Available. FINDINGS: NECK No radiotracer activity in neck lymph nodes. Incidental CT finding: None. CHEST No radiotracer accumulation within mediastinal or hilar lymph nodes. No suspicious pulmonary nodules on the CT scan. Incidental CT finding: None. ABDOMEN/PELVIS Prostate: Focal intense radiotracer activity in the posterior RIGHT mid gland with SUV max equal 36. Second smaller focus in the LEFT mid gland also with intense radiotracer activity (image 212). Seminal vesicles grossly normal. Lymph nodes: No radiotracer avid lymph nodes in the pelvis. No periaortic retroperitoneal radiotracer avid lymph nodes. Liver: No evidence of liver metastasis. Incidental CT finding: None. SKELETON No focal activity to suggest skeletal metastasis. IMPRESSION: 1. Two foci of intense radiotracer activity in the prostate gland consistent with primary prostate adenocarcinoma. 2. No evidence of metastatic adenopathy in the pelvis or periaortic retroperitoneum. 3. No evidence of visceral metastasis or skeletal metastasis. Electronically Signed   By: Genevive Bi M.D.   On: 02/23/2023 15:37      IMPRESSION/PLAN: 1. 72 y.o. gentleman with Stage T2 adenocarcinoma of the prostate with Gleason score of 4+4, and PSA of 6.00 (2.98 on  finasteride).  We discussed the patient's workup and outlined the nature of prostate cancer in this setting. The  patient's T stage, Gleason's score, and PSA put him into the high risk group. Accordingly, he is eligible for a variety of potential treatment options including  ADT in combination with 8 weeks of external radiation or prostatectomy. We discussed the available radiation techniques, and focused on the details and logistics of delivery. Given his small prostate size, he is not an ideal candidate for ADT combined with upfront brachytherapy boost. We discussed and outlined the risks, benefits, short and long-term effects associated with radiotherapy and compared and contrasted these with prostatectomy. We discussed the role of SpaceOAR gel in reducing the rectal toxicity associated with radiotherapy. We also detailed the role of ADT in the treatment of high risk prostate cancer and outlined the associated side effects that could be expected with this therapy.  He appears to have a good understanding of his disease and our treatment recommendations which are of curative intent.  He was encouraged to ask questions that were answered to his stated satisfaction.  At the conclusion of our conversation, the patient remains undecided on his treatment choice. He understands that he is eligible for both a prostatectomy and ADT combined with 8 weeks of external radiation. He would like to discuss his options with his family and his PCP, whom he will see next week. He was given our contact information and knows to call if he has questions or makes a decision on treatment.   We personally spent 60 minutes in this encounter including chart review, reviewing radiological studies, meeting face-to-face with the patient, entering orders and completing documentation.     Joyice Faster, PA-C    Margaretmary Dys, MD  [facility]  Radiation Oncology Direct Dial: [phone]  Fax: [phone] [email or web address]   Skype  [email or web address]

## 2023-02-25 ENCOUNTER — Telehealth: Payer: Self-pay | Admitting: Cardiology

## 2023-02-25 NOTE — Telephone Encounter (Signed)
Patient been diagnosed with prostate cancer, wanted to ask the dr a couple of questions on how it will affect his heart. Please advise

## 2023-02-25 NOTE — Telephone Encounter (Signed)
Patient was diagnosed with prostate cancer. He states he has two options one is surgery to remove his prostate and his second options is hormone therapy. He wanted to know if you think his heart can with stand a 4 hour surgery before he makes his decision.

## 2023-02-26 NOTE — Telephone Encounter (Signed)
Patient is returning call.  °

## 2023-02-26 NOTE — Telephone Encounter (Signed)
Left message for pt to call.

## 2023-02-26 NOTE — Telephone Encounter (Signed)
Spoke with pt, Aware of dr crenshaw's recommendations.  °

## 2023-03-01 NOTE — Progress Notes (Signed)
RN left message for call back.  

## 2023-03-02 DIAGNOSIS — C61 Malignant neoplasm of prostate: Secondary | ICD-10-CM

## 2023-03-02 NOTE — Progress Notes (Signed)
RN spoke with patient and his wife and introduced myself and role.  RN provided education on treatment options with radiation and hormonal therapy. Pt is interested in learning more about proton therapy, and will meet with his PCP on 6/12 before finalizing any treatment decision.    Referral placed for [facility].

## 2023-03-03 DIAGNOSIS — L659 Nonscarring hair loss, unspecified: Secondary | ICD-10-CM | POA: Diagnosis not present

## 2023-03-03 DIAGNOSIS — I10 Essential (primary) hypertension: Secondary | ICD-10-CM | POA: Diagnosis not present

## 2023-03-03 DIAGNOSIS — K219 Gastro-esophageal reflux disease without esophagitis: Secondary | ICD-10-CM | POA: Diagnosis not present

## 2023-03-03 DIAGNOSIS — R911 Solitary pulmonary nodule: Secondary | ICD-10-CM | POA: Diagnosis not present

## 2023-03-03 DIAGNOSIS — K2270 Barrett's esophagus without dysplasia: Secondary | ICD-10-CM | POA: Diagnosis not present

## 2023-03-03 DIAGNOSIS — I2510 Atherosclerotic heart disease of native coronary artery without angina pectoris: Secondary | ICD-10-CM | POA: Diagnosis not present

## 2023-03-03 DIAGNOSIS — J4530 Mild persistent asthma, uncomplicated: Secondary | ICD-10-CM | POA: Diagnosis not present

## 2023-03-03 DIAGNOSIS — M5416 Radiculopathy, lumbar region: Secondary | ICD-10-CM | POA: Diagnosis not present

## 2023-03-03 DIAGNOSIS — M5412 Radiculopathy, cervical region: Secondary | ICD-10-CM | POA: Diagnosis not present

## 2023-03-03 DIAGNOSIS — Z0000 Encounter for general adult medical examination without abnormal findings: Secondary | ICD-10-CM | POA: Diagnosis not present

## 2023-03-03 DIAGNOSIS — C61 Malignant neoplasm of prostate: Secondary | ICD-10-CM | POA: Diagnosis not present

## 2023-03-03 DIAGNOSIS — E7800 Pure hypercholesterolemia, unspecified: Secondary | ICD-10-CM | POA: Diagnosis not present

## 2023-03-10 ENCOUNTER — Encounter: Payer: Self-pay | Admitting: Cardiology

## 2023-03-17 NOTE — Progress Notes (Signed)
Cardiology Clinic Note   Patient Name: Gilbert Thompson Date of Encounter: 03/19/2023  Primary Care Provider:  Mosetta Putt, MD Primary Cardiologist:  Gilbert Millers, MD  Patient Profile    72 year old male with history of CAD with coronary CTA August 2023 with a calcium score of 620, moderate to severe stenosis in the mid LAD otherwise nonobstructive disease, FFR was abnormal in the LAD distribution.    Cardiac catheterization June 10 2374% followed by 70% lesions, in the LAD, with LVEDP of 10.  Medical therapy was recommended, but if symptoms persisted consider PCI although this would be high risk.  He was continued on medication regimen last seen by Dr. Jens Thompson on 08/31/2022.   Past Medical History    Past Medical History:  Diagnosis Date   Asthma    GERD (gastroesophageal reflux disease)    Hyperlipidemia    Hypertension    Past Surgical History:  Procedure Laterality Date   LEFT HEART CATH AND CORONARY ANGIOGRAPHY N/A 06/05/2022   Procedure: LEFT HEART CATH AND CORONARY ANGIOGRAPHY;  Surgeon: Gilbert Records, MD;  Location: [facility];  Service: Cardiovascular;  Laterality: N/A;   PROSTATE BIOPSY     UPPER GASTROINTESTINAL ENDOSCOPY      Allergies  No Known Allergies  History of Present Illness    [name] returns for ongoing assessment management of coronary artery disease, hypertension, and hyperlipidemia.  Patient has LAD disease which is being treated medically with consideration for high risk PCI if his symptoms worsened.  He comes today as he has been recently diagnosed with prostate cancer within the last 3 months.  He is trying to make a decision about whether or not to have surgery or hormone therapy with radiation.  He is trying to be proactive and be checked out by cardiology in case he does decide to move forward with surgery.  He remains very active playing golf 3 times a week, walking the golf course which he states ends up being a total  of 6 miles each time he plays 18 holes.  He denies chest pain, dyspnea on exertion, dizziness, or profound fatigue.  He is able to complete his golf game in the usual.  Time without any issues requiring him to have to stop due to symptoms.   Home Medications    Current Outpatient Medications  Medication Sig Dispense Refill   albuterol (VENTOLIN HFA) 108 (90 Base) MCG/ACT inhaler Inhale 1-2 puffs into the lungs every 6 (six) hours as needed for wheezing or shortness of breath.     allopurinol (ZYLOPRIM) 300 MG tablet Take 300 mg by mouth daily.     ALPRAZolam (XANAX) 1 MG tablet Take 1 mg by mouth at bedtime as needed for anxiety or sleep.     aspirin EC 81 MG tablet Take 1 tablet (81 mg total) by mouth daily. Swallow whole. 90 tablet 3   cholecalciferol (VITAMIN D3) 25 MCG (1000 UNIT) tablet Take 1,000 Units by mouth daily.     finasteride (PROSCAR) 5 MG tablet 1.25 mg daily.     HYDROcodone-acetaminophen (NORCO/VICODIN) 5-325 MG tablet Take 1 tablet by mouth every 6 (six) hours as needed for severe pain.     metoprolol succinate (TOPROL XL) 25 MG 24 hr tablet Take 1 tablet (25 mg total) by mouth daily. 30 tablet 11   TRELEGY ELLIPTA 100-62.5-25 MCG/ACT AEPB Inhale 1 puff into the lungs daily.     clopidogrel (PLAVIX) 75 MG tablet Take 1 tablet (75 mg  total) by mouth daily. 90 tablet 3   ezetimibe (ZETIA) 10 MG tablet Take 1 tablet (10 mg total) by mouth daily. 90 tablet 3   losartan (COZAAR) 50 MG tablet Take 1 tablet (50 mg total) by mouth daily. 90 tablet 3   nitroGLYCERIN (NITROSTAT) 0.4 MG SL tablet Place 1 tablet (0.4 mg total) under the tongue every 5 (five) minutes as needed for chest pain. 90 tablet 3   pantoprazole (PROTONIX) 40 MG tablet Take 1 tablet (40 mg total) by mouth 2 (two) times daily. 180 tablet 3   rosuvastatin (CRESTOR) 40 MG tablet Take 1 tablet (40 mg total) by mouth daily. 90 tablet 3   No current facility-administered medications for this visit.     Family History     Family History  Problem Relation Age of Onset   Coronary artery disease Father    Coronary artery disease Brother    Colon cancer Neg Hx    Esophageal cancer Neg Hx    Rectal cancer Neg Hx    Stomach cancer Neg Hx    He indicated that his mother is deceased. He indicated that his father is deceased. He indicated that his brother is alive. He indicated that the status of his neg hx is unknown.  Social History    Social History   Socioeconomic History   Marital status: Married    Spouse name: Not on file   Number of children: 1   Years of education: Not on file   Highest education level: Not on file  Occupational History   Not on file  Tobacco Use   Smoking status: Never   Smokeless tobacco: Never  Vaping Use   Vaping Use: Never used  Substance and Sexual Activity   Alcohol use: Yes    Comment: 2 drinks per day   Drug use: Never   Sexual activity: Not on file  Other Topics Concern   Not on file  Social History Narrative   Not on file   Social Determinants of Health   Financial Resource Strain: Not on file  Food Insecurity: No Food Insecurity (02/24/2023)   Hunger Vital Sign    Worried About Running Out of Food in the Last Year: Never true    Ran Out of Food in the Last Year: Never true  Transportation Needs: No Transportation Needs (02/24/2023)   PRAPARE - Administrator, Civil Service (Medical): No    Lack of Transportation (Non-Medical): No  Physical Activity: Not on file  Stress: Not on file  Social Connections: Not on file  Intimate Partner Violence: Not At Risk (02/24/2023)   Humiliation, Afraid, Rape, and Kick questionnaire    Fear of Current or Ex-Partner: No    Emotionally Abused: No    Physically Abused: No    Sexually Abused: No     Review of Systems    General:  No chills, fever, night sweats or weight changes.  Cardiovascular:  No chest pain, dyspnea on exertion, edema, orthopnea, palpitations, paroxysmal nocturnal  dyspnea. Dermatological: No rash, lesions/masses Respiratory: No cough, dyspnea Urologic: No hematuria, dysuria Abdominal:   No nausea, vomiting, diarrhea, bright red blood per rectum, melena, or hematemesis Neurologic:  No visual changes, wkns, changes in mental status. All other systems reviewed and are otherwise negative except as noted above.     Physical Exam    VS:  BP 132/84   Pulse (!) 52   Ht 5\' 9"  (1.753 m)   Wt 196 lb  9.6 oz (89.2 kg)   SpO2 98%   BMI 29.03 kg/m  , BMI Body mass index is 29.03 kg/m.     GEN: Well nourished, well developed, in no acute distress. HEENT: normal. Neck: Supple, no JVD, carotid bruits, or masses. Cardiac: RRR, no murmurs, rubs, or gallops. No clubbing, cyanosis, edema.  Radials/DP/PT 2+ and equal bilaterally.  Respiratory:  Respirations regular and unlabored, clear to auscultation bilaterally. GI: Soft, nontender, nondistended, BS + x 4. MS: no deformity or atrophy. Skin: warm and dry, no rash. Neuro:  Strength and sensation are intact. Psych: Normal affect.  Accessory Clinical Findings  Sinus bradycardia with sinus arrhythmia with 1st degree A-V block Right bundle branch block Possible Inferior infarct , age undetermined No previous ECGs available       Lab Results  Component Value Date   WBC 6.4 05/28/2022   HGB 13.5 05/28/2022   HCT 39.2 05/28/2022   MCV 92 05/28/2022   PLT 257 05/28/2022   Lab Results  Component Value Date   CREATININE 1.21 08/03/2022   BUN 28 (H) 08/03/2022   NA 137 08/03/2022   K 4.0 08/03/2022   CL 103 08/03/2022   CO2 27 08/03/2022   Lab Results  Component Value Date   ALT 23 10/28/2022   AST 35 10/28/2022   ALKPHOS 61 10/28/2022   BILITOT 0.5 10/28/2022   Lab Results  Component Value Date   CHOL 148 10/28/2022   HDL 98 10/28/2022   LDLCALC 36 10/28/2022   TRIG 70 10/28/2022   CHOLHDL 1.5 10/28/2022    No results found for: "HGBA1C"  Review of Prior Studies    LHC 06/05/2022 Right  dominant anatomy with no obstructive disease noted. LAD contains tandem proximal to mid LAD lesions 75% followed by 70%.  LAD is large and transapical.  The involved segment is moderately to heavily calcified.  4 side branches arise from the segment. Ramus intermedius is large and normal. Circumflex is small. Left main is normal. LV is normal.  EDP is 10 mmHg.   RECOMMENDATIONS: Anti-ischemic therapy.  Metoprolol succinate 25 mg a started today and should be titrated. Aggressive risk factor modification Plavix 75 mg daily started If unable to control symptoms on medical therapy, higher risk PCI could be performed with a relatively long stent in the proximal to mid vessel that would jailed for small side branches.   Echocardiogram 05/04/2022 1. Left ventricular ejection fraction, by estimation, is 60 to 65%. The  left ventricle has normal function. The left ventricle has no regional  wall motion abnormalities. There is severe concentric left ventricular  hypertrophy. Left ventricular diastolic   parameters are consistent with Grade I diastolic dysfunction (impaired  relaxation). Elevated left ventricular end-diastolic pressure.   2. Right ventricular systolic function is normal. The right ventricular  size is normal. There is normal pulmonary artery systolic pressure.   3. The mitral valve is normal in structure. Mild mitral valve  regurgitation. No evidence of mitral stenosis.   4. The aortic valve is tricuspid. There is mild calcification of the  aortic valve. There is mild thickening of the aortic valve. Aortic valve  regurgitation is trivial. Aortic valve sclerosis/calcification is present,  without any evidence of aortic  stenosis.   5. There is borderline dilatation of the aortic root, measuring 37 mm.   6. The inferior vena cava is normal in size with greater than 50%  respiratory variability, suggesting right atrial pressure of 3 mmHg.    Assessment &  Plan   1.  Coronary  artery disease: History of cardiac catheterization 06/05/2022, revealing tandem proximal to mid LAD lesions.  The patient is treated medically with aggressive risk factor modification, clopidogrel 75 mg daily, statin therapy and beta-blocker.  He is completely asymptomatic able to walk 18 holes playing golf 3 times a week without recurrence of symptoms.  If the patient does proceed with prostate surgery he will need to hold Eliquis for 5 days preoperatively, aspirin 72 hours prior to surgery.  He is he is given refills on clopidogrel today.  He will follow-up with Dr. Jens Thompson on previously scheduled appointment in 1 month.  2.  Hypercholesterolemia: Most recent total cholesterol 148, LDL 36 which is dated 10/28/2022.  Continue Zetia 10 mg daily, and rosuvastatin milligrams daily.  Repeating lipids and LFTs next week in anticipation of follow-up appointment with Dr. Jens Thompson.  3.  GERD: The patient is on a PPI which she states is helping some but does allow for continued reflux symptoms later in the day.  Will have him take to 40 mg twice daily.   4.  Newly diagnosed prostate cancer: Being followed by Dr's Derrell Lolling and Kathrynn Running.  Will be having conference later next week to discuss his options.     Signed, Bettey Mare. Liborio Nixon, ANP, AACC   03/19/2023 2:46 PM      Office [phone] Fax [phone]  Notice: This dictation was prepared with Dragon dictation along with smaller phrase technology. Any transcriptional errors that result from this process are unintentional and may not be corrected upon review.

## 2023-03-18 ENCOUNTER — Telehealth: Payer: Self-pay

## 2023-03-18 NOTE — Telephone Encounter (Signed)
LVM with my contact and information about upcoming clinic appointment on 7/9, did advise I was sending packet

## 2023-03-19 ENCOUNTER — Encounter: Payer: Self-pay | Admitting: Adult Health

## 2023-03-19 ENCOUNTER — Ambulatory Visit: Payer: Medicare HMO | Attending: Adult Health | Admitting: Adult Health

## 2023-03-19 VITALS — BP 132/84 | HR 52 | Ht 69.0 in | Wt 196.6 lb

## 2023-03-19 DIAGNOSIS — I2510 Atherosclerotic heart disease of native coronary artery without angina pectoris: Secondary | ICD-10-CM | POA: Diagnosis not present

## 2023-03-19 DIAGNOSIS — E785 Hyperlipidemia, unspecified: Secondary | ICD-10-CM | POA: Diagnosis not present

## 2023-03-19 DIAGNOSIS — C61 Malignant neoplasm of prostate: Secondary | ICD-10-CM

## 2023-03-19 DIAGNOSIS — Z01818 Encounter for other preprocedural examination: Secondary | ICD-10-CM | POA: Diagnosis not present

## 2023-03-19 MED ORDER — ROSUVASTATIN CALCIUM 40 MG PO TABS: 40.0000 mg | ORAL_TABLET | Freq: Every day | ORAL | 3 refills | Status: AC

## 2023-03-19 MED ORDER — LOSARTAN POTASSIUM 50 MG PO TABS: 50.0000 mg | ORAL_TABLET | Freq: Every day | ORAL | 3 refills | Status: DC

## 2023-03-19 MED ORDER — PANTOPRAZOLE SODIUM 40 MG PO TBEC: 40.0000 mg | DELAYED_RELEASE_TABLET | Freq: Two times a day (BID) | ORAL | 3 refills | Status: DC

## 2023-03-19 MED ORDER — EZETIMIBE 10 MG PO TABS
10.0000 mg | ORAL_TABLET | Freq: Every day | ORAL | 3 refills | Status: DC
Start: 2023-03-19 — End: 2024-05-18

## 2023-03-19 MED ORDER — CLOPIDOGREL BISULFATE 75 MG PO TABS: 75.0000 mg | ORAL_TABLET | Freq: Every day | ORAL | 3 refills | Status: DC

## 2023-03-19 NOTE — Patient Instructions (Signed)
Medication Instructions:  No Changes *If you need a refill on your cardiac medications before your next appointment, please call your pharmacy*   Lab Work: CMET, CBC, Lipid Panel. Nest Week If you have labs (blood work) drawn today and your tests are completely normal, you will receive your results only by: MyChart Message (if you have MyChart) OR A paper copy in the mail If you have any lab test that is abnormal or we need to change your treatment, we will call you to review the results.   Testing/Procedures: No Testing   Follow-Up: At [facility], you and your health needs are our priority.  As part of our continuing mission to provide you with exceptional heart care, we have created designated Provider Care Teams.  These Care Teams include your primary Cardiologist (physician) and Advanced Practice Providers (APPs -  Physician Assistants and Nurse Practitioners) who all work together to provide you with the care you need, when you need it.  We recommend signing up for the patient portal called "MyChart".  Sign up information is provided on this After Visit Summary.  MyChart is used to connect with patients for Virtual Visits (Telemedicine).  Patients are able to view lab/test results, encounter notes, upcoming appointments, etc.  Non-urgent messages can be sent to your provider as well.   To learn more about what you can do with MyChart, go to [email or web address].    Your next appointment:   Keep Scheduled Appointment  Provider:   Olga Millers, MD

## 2023-03-29 DIAGNOSIS — E785 Hyperlipidemia, unspecified: Secondary | ICD-10-CM | POA: Diagnosis not present

## 2023-03-29 DIAGNOSIS — Z01818 Encounter for other preprocedural examination: Secondary | ICD-10-CM | POA: Diagnosis not present

## 2023-03-29 NOTE — Progress Notes (Unsigned)
                               Care Plan Summary  Name: Gilbert Thompson DOB: [birth year]   Your Medical Team:   Urologist -  Dr. Heloise Purpura, [facility]  Radiation Oncologist - Dr. Margaretmary Dys, [facility]     Recommendations: 1) Surgery     * These recommendations are based on information available as of today's consult.      Recommendations may change depending on the results of further tests or exams.    Next Steps: 1) [facility] will contact you to set up appointments for physical therapy, and surgery.    When appointments need to be scheduled, you will be contacted by [facility] and/or [facility].  Questions?  Please do not hesitate to call Cherlyn Cushing, BSN, RN at [phone] with any questions or concerns.  [name] is your Oncology Nurse Navigator and is available to assist you while you're receiving your medical care at [facility]

## 2023-03-29 NOTE — Progress Notes (Signed)
RN spoke with patient and confirmed PMDC appointment time and date for 7/9 @ 12:45pm.

## 2023-03-30 ENCOUNTER — Encounter: Payer: Self-pay | Admitting: Genetic Counselor

## 2023-03-30 ENCOUNTER — Ambulatory Visit
Admission: RE | Admit: 2023-03-30 | Discharge: 2023-03-30 | Disposition: A | Discharge status: Home / Self Care | Payer: Medicare HMO | Source: Ambulatory Visit | Attending: Radiation Oncology | Admitting: Radiation Oncology

## 2023-03-30 ENCOUNTER — Encounter: Payer: Self-pay | Admitting: Radiation Oncology

## 2023-03-30 ENCOUNTER — Inpatient Hospital Stay: Payer: Medicare HMO

## 2023-03-30 ENCOUNTER — Other Ambulatory Visit: Payer: Self-pay | Admitting: Genetic Counselor

## 2023-03-30 ENCOUNTER — Inpatient Hospital Stay: Payer: Medicare HMO | Attending: Radiation Oncology | Admitting: Genetic Counselor

## 2023-03-30 VITALS — BP 125/56 | HR 49 | Temp 97.7°F | Resp 20 | Ht 69.0 in | Wt 195.0 lb

## 2023-03-30 DIAGNOSIS — Z800 Family history of malignant neoplasm of digestive organs: Secondary | ICD-10-CM

## 2023-03-30 DIAGNOSIS — C61 Malignant neoplasm of prostate: Secondary | ICD-10-CM

## 2023-03-30 DIAGNOSIS — Z1379 Encounter for other screening for genetic and chromosomal anomalies: Secondary | ICD-10-CM

## 2023-03-30 DIAGNOSIS — Z803 Family history of malignant neoplasm of breast: Secondary | ICD-10-CM | POA: Diagnosis not present

## 2023-03-30 DIAGNOSIS — Z8042 Family history of malignant neoplasm of prostate: Secondary | ICD-10-CM

## 2023-03-30 DIAGNOSIS — Z191 Hormone sensitive malignancy status: Secondary | ICD-10-CM | POA: Diagnosis not present

## 2023-03-30 DIAGNOSIS — Z8546 Personal history of malignant neoplasm of prostate: Secondary | ICD-10-CM | POA: Diagnosis not present

## 2023-03-30 LAB — COMPREHENSIVE METABOLIC PANEL
ALT: 18 IU/L (ref 0–44)
AST: 23 IU/L (ref 0–40)
Albumin: 4.3 g/dL (ref 3.8–4.8)
Alkaline Phosphatase: 62 IU/L (ref 44–121)
BUN/Creatinine Ratio: 12 (ref 10–24)
BUN: 15 mg/dL (ref 8–27)
Bilirubin Total: 0.5 mg/dL (ref 0.0–1.2)
CO2: 25 mmol/L (ref 20–29)
Calcium: 9.3 mg/dL (ref 8.6–10.2)
Chloride: 103 mmol/L (ref 96–106)
Creatinine, Ser: 1.21 mg/dL (ref 0.76–1.27)
Globulin, Total: 2 g/dL (ref 1.5–4.5)
Glucose: 97 mg/dL (ref 70–99)
Potassium: 4.1 mmol/L (ref 3.5–5.2)
Sodium: 139 mmol/L (ref 134–144)
Total Protein: 6.3 g/dL (ref 6.0–8.5)
eGFR: 63 mL/min/{1.73_m2} (ref >59)

## 2023-03-30 LAB — LIPID PANEL
Chol/HDL Ratio: 1.6 ratio (ref 0.0–5.0)
Cholesterol, Total: 127 mg/dL (ref 100–199)
HDL: 79 mg/dL (ref >39)
LDL Chol Calc (NIH): 28 mg/dL (ref 0–99)
Triglycerides: 115 mg/dL (ref 0–149)
VLDL Cholesterol Cal: 20 mg/dL (ref 5–40)

## 2023-03-30 LAB — CBC
Hematocrit: 39.8 % (ref 37.5–51.0)
Hemoglobin: 13.4 g/dL (ref 13.0–17.7)
MCH: 31.2 pg (ref 26.6–33.0)
MCHC: 33.7 g/dL (ref 31.5–35.7)
MCV: 93 fL (ref 79–97)
Platelets: 250 10*3/uL (ref 150–450)
RBC: 4.3 x10E6/uL (ref 4.14–5.80)
RDW: 13.6 % (ref 11.6–15.4)
WBC: 6.4 10*3/uL (ref 3.4–10.8)

## 2023-03-30 LAB — GENETIC SCREENING ORDER

## 2023-03-30 NOTE — Consult Note (Signed)
Multi-Disciplinary Clinic     03/30/2023   --------------------------------------------------------------------------------   Gilbert Thompson  MRN: [id number]  DOB: Feb 13, 1951, 72 year old Male  SSN:    PRIMARY CARE:  Gilbert Fiscal, MD  PRIMARY CARE FAX:  [phone]  REFERRING:  Gilbert Thompson  PROVIDER:  Heloise Thompson, M.D.  LOCATION:  [facility] 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 He underwent a TRUS biopsy of the Gilbert on 01/19/23 and his ultrasound at the time of biopsy clearing indicated a hypoechoic 1 cm lesion that correlated with his right mid nodule. 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 He returns today after going back and forth between his decisions for treatment between long-term ADT/radiation and surgery.   Family history: None.   Imaging studies: PSMA PET scan (02/22/23) - Negative for metastatic disease.   PMH: He has a history of anxiety, asthma, GERD, gout, hypertension, CAD, and hyperlipidemia.  PSH: No abdominal surgeries.   TNM stage: cT2a N0 M0 (right mid nodule)  PSA: 6.00 (2.98 on finasteride)  Gleason score: 4+4=8 (GG 4)  Biopsy (01/19/23): 5/14 cores positive  Left: Benign  Right: R mid (90%, 4+3=7, PNI), R lateral mid (70%, 3+4=7), R base (5%, 3+4=7)  Nodule at right mid: 2/2 cores positive (40%, 4+4=8 and 70%, 4+3=7)  Gilbert volume: 19.5 cc    Nomogram  OC disease: 36%  EPE: 62%  SVI: 12%  LNI: 15%  PFS (5 year, 10 year): 44%, 29%   Urinary function: IPSS is 4.  Erectile function: SHIM score is 2.     ALLERGIES: No Allergies    MEDICATIONS: Allopurinol  Aspirin  Finasteride  Levofloxacin 750 mg tablet Please take one tablet the morning of your biopsy.  Metoprolol Succinate  Albuterol Sulfate  Alprazolam  Cholecalciferol  Clopidogrel  Ezetimibe  Fluticasone-Vilanterol  Hydrocodone-Acetaminophen  Losartan Potassium  Nitroglycerin  Pantoprazole Sodium  Rosuvastatin Calcium     GU PSH: Gilbert Needle Biopsy - 01/19/2023     NON-GU PSH: Surgical Pathology, Gross And Microscopic Examination For Gilbert Needle - 01/19/2023     GU PMH: Gilbert Cancer - 02/08/2023 Elevated PSA - 01/19/2023, - 01/06/2023 Gilbert nodule w/o LUTS - 01/19/2023, - 01/06/2023    NON-GU PMH: Anxiety Asthma Coronary Artery Disease GERD Gout Hypercholesterolemia Hypertension    FAMILY HISTORY: 1 Daughter - Runs in Family   SOCIAL HISTORY: Marital Status: Married Preferred Language: English; Ethnicity: Not Hispanic Or Latino; Race: White Current Smoking Status: Patient has never smoked.   Tobacco Use Assessment Completed: Used Tobacco in last 30 days? Does drink.     REVIEW OF SYSTEMS:    GU Review Male:   Patient denies frequent urination, hard to postpone urination, burning/ pain with urination, get up at night to urinate, leakage of urine, stream starts and stops, trouble starting your streams, and have to strain to urinate .  Gastrointestinal (Upper):   Patient denies nausea and vomiting.  Gastrointestinal (Lower):   Patient denies diarrhea and constipation.  Constitutional:   Patient denies fever, night sweats, weight loss, and fatigue.  Skin:   Patient denies skin rash/ lesion and itching.  Eyes:   Patient denies blurred vision and double vision.  Ears/ Nose/ Throat:   Patient denies sore throat and sinus problems.   Hematologic/Lymphatic:   Patient denies swollen glands and easy bruising.  Cardiovascular:   Patient denies leg swelling and chest pains.  Respiratory:   Patient denies cough and shortness of breath.  Endocrine:   Patient denies excessive thirst.  Musculoskeletal:   Patient denies back pain and joint pain.  Neurological:   Patient denies headaches and dizziness.  Psychologic:   Patient denies depression and anxiety.   VITAL SIGNS: None   MULTI-SYSTEM PHYSICAL EXAMINATION:    Constitutional: Well-nourished. No physical deformities. Normally developed. Good grooming.  Respiratory: No labored breathing, no use of accessory muscles. Clear bilaterally.  Cardiovascular: Normal temperature, normal extremity pulses, no swelling, no varicosities. Regular rate and rhythm.  Gastrointestinal: No mass, no tenderness, no rigidity, non obese abdomen.      Complexity of Data:  Lab Test Review:   PSA  Records Review:   Pathology Reports, Previous Patient Records  X-Ray Review: PET- PSMA Scan: Reviewed Films.     01/06/23  PSA  Total PSA 2.98 ng/mL    PROCEDURES: None   ASSESSMENT:      ICD-10 Details  1 GU:   Gilbert Thompson    PLAN:           Document Letter(s):  Created for Patient: Clinical Summary   Created for Patient: Clinical Summary         Notes:   1. 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 does adamantly wish to proceed with surgical therapy for his Gilbert cancer. He would like to avoid androgen deprivation therapy and does feel confident with this decision assuming that his cardiac risk would be acceptably low. He does have follow-up later this month with Gilbert Thompson.   We discussed surgical therapy for Gilbert cancer including the different available surgical approaches. We discussed, in detail, the risks and expectations of surgery with regard to cancer control, urinary control, and erectile function as well as the expected  postoperative recovery process. Additional risks of surgery including but not limited to bleeding, infection, hernia formation, nerve damage, lymphocele formation, bowel/rectal injury potentially necessitating colostomy, damage to the urinary tract resulting in urine leakage, urethral stricture, and the cardiopulmonary risks such as myocardial infarction, stroke, death, venothromboembolism, etc. were explained. The risk of open surgical conversion for robotic/laparoscopic prostatectomy was also discussed.   He confirmed his decision today after meeting with myself and Dr. Kathrynn Running. He will be tentatively scheduled for a unilateral left nerve sparing robot-assisted laparoscopic radical prostatectomy and bilateral pelvic lymphadenectomy. He is scheduled for preoperative physical therapy tomorrow. We will proceed with obtaining cardiac clearance/risk assessment from Gilbert Thompson.   CC: Gilbert Thompson  Dr. Margaretmary Dys  Dr. Olga Millers        Next Appointment:      Next Appointment: 03/31/2023 10:00 AM    Appointment Type: 60 Minute PT Pre-Op    Location: [facility] [phone]    Provider: Jeanice Lim      E & M CODES: We spent 45 minutes dedicated to evaluation and management time, including face to face interaction, discussions on coordination of care, documentation, result review, and  discussion with others as applicable.

## 2023-03-30 NOTE — Progress Notes (Unsigned)
REFERRING PROVIDER: Margaretmary Dys, MD  PRIMARY PROVIDER:  Mosetta Putt, MD  PRIMARY REASON FOR VISIT:  1. Malignant neoplasm of prostate (HCC)   2. Family history of prostate cancer   3. Family history of colon cancer    HISTORY OF PRESENT ILLNESS:   Gilbert Thompson, a 72 y.o. male, was seen for a [place] cancer genetics consultation at the request of Dr. Kathrynn Running due to a personal and family history of cancer.  Gilbert Thompson presents to clinic today to discuss the possibility of a hereditary predisposition to cancer, to discuss genetic testing, and to further clarify his future cancer risks, as well as potential cancer risks for family members.   In April 2024, at the age of 5, Gilbert Thompson was diagnosed with prostate cancer (Gleason score: 8).  CANCER HISTORY:  Oncology History  Malignant neoplasm of prostate (HCC)  01/19/2023 Cancer Staging   Staging form: Prostate, AJCC 8th Edition - Clinical stage from 01/19/2023: Stage IIC (cT2a, cN0, cM0, PSA: 6, Grade Group: 4) - Signed by Marcello Fennel, PA-C on 03/30/2023 Histopathologic type: Adenocarcinoma, NOS Stage prefix: Initial diagnosis Prostate specific antigen (PSA) range: Less than 10 Gleason primary pattern: 4 Gleason secondary pattern: 4 Gleason score: 8 Histologic grading system: 5 grade system Number of biopsy cores examined: 14 Number of biopsy cores positive: 5 Location of positive needle core biopsies: One side   02/24/2023 Initial Diagnosis   Malignant neoplasm of prostate [facility])     Past Medical History:  Diagnosis Date   Asthma    GERD (gastroesophageal reflux disease)    Hyperlipidemia    Hypertension     Past Surgical History:  Procedure Laterality Date   LEFT HEART CATH AND CORONARY ANGIOGRAPHY N/A 06/05/2022   Procedure: LEFT HEART CATH AND CORONARY ANGIOGRAPHY;  Surgeon: Lyn Records, MD;  Location: [facility] IN[facility];  Service: Cardiovascular;  Laterality: N/A;   PROSTATE BIOPSY     UPPER  GASTROINTESTINAL ENDOSCOPY      Social History   Socioeconomic History   Marital status: Married    Spouse name: Not on file   Number of children: 1   Years of education: Not on file   Highest education level: Not on file  Occupational History   Not on file  Tobacco Use   Smoking status: Never   Smokeless tobacco: Never  Vaping Use   Vaping Use: Never used  Substance and Sexual Activity   Alcohol use: Yes    Comment: 2 drinks per day   Drug use: Never   Sexual activity: Not on file  Other Topics Concern   Not on file  Social History Narrative   Not on file   Social Determinants of Health   Financial Resource Strain: Not on file  Food Insecurity: No Food Insecurity (03/30/2023)   Hunger Vital Sign    Worried About Running Out of Food in the Last Year: Never true    Ran Out of Food in the Last Year: Never true  Transportation Needs: No Transportation Needs (03/30/2023)   PRAPARE - Administrator, Civil Service (Medical): No    Lack of Transportation (Non-Medical): No  Physical Activity: Not on file  Stress: Not on file  Social Connections: Not on file     FAMILY HISTORY:  We obtained a detailed, 4-generation family history.  Significant diagnoses are listed below: Family History  Problem Relation Age of Onset   Coronary artery disease Father    Prostate cancer Father  60 - 69   Leukemia Father 67   Coronary artery disease Brother    Prostate cancer Brother 7 - 59   Colon cancer Brother 32   Pancreatic cancer Cousin        maternal first cousin   Breast cancer Daughter 25       negative genetic testing   Esophageal cancer Neg Hx    Rectal cancer Neg Hx    Stomach cancer Neg Hx        Gilbert Thompson***  GENETIC COUNSELING ASSESSMENT: Gilbert Thompson is a 72 y.o. male with a personal and family history of cancer which is somewhat suggestive of a hereditary predisposition to cancer. We, therefore, discussed and recommended the following at today's visit.    DISCUSSION: We discussed that 5 - 10% of cancer is hereditary, with most cases of prostate cancer associated with BRCA1/2.  There are other genes that can be associated with hereditary prostate cancer syndromes.  We discussed that testing is beneficial for several reasons including knowing how to follow individuals after completing their treatment, identifying whether potential treatment options would be beneficial, and understanding if other family members could be at risk for cancer and allowing them to undergo genetic testing.   We reviewed the characteristics, features and inheritance patterns of hereditary cancer syndromes. We also discussed genetic testing, including the appropriate family members to test, the process of testing, insurance coverage and turn-around-time for results. We discussed the implications of a negative, positive, carrier and/or variant of uncertain significant result. We recommended Gilbert Thompson pursue genetic testing for a panel that includes genes associated with breast, prostate, colon, and pancreatic cancer.   Gilbert Thompson elected to have [facility] Custom Panel. The Custom Hereditary Cancers Panel offered by [facility] includes sequencing and/or deletion duplication testing of the following 43 genes: APC, ATM, AXIN2, BAP1, BARD1, BMPR1A, BRCA1, BRCA2, BRIP1, CDH1, CDK4, CDKN2A (p14ARF and p16INK4a only), CHEK2, CTNNA1, EPCAM (Deletion/duplication testing only), FH, GREM1 (promoter region duplication testing only), HOXB13, KIT, MBD4, MEN1, MLH1, MSH2, MSH3, MSH6, MUTYH, NF1, NHTL1, PALB2, PDGFRA, PMS2, POLD1, POLE, PTEN, RAD51C, RAD51D, SMAD4, SMARCA4. STK11, TP53, TSC1, TSC2, and VHL.  Based on Gilbert Thompson's personal and family history of cancer, he meets medical criteria for genetic testing. Despite that he meets criteria, he may still have an out of pocket cost. We discussed that if his out of pocket cost for testing is over $100, the laboratory will call and confirm whether he  wants to proceed with testing.  If the out of pocket cost of testing is less than $100 he will be billed by the genetic testing laboratory.   PLAN: After considering the risks, benefits, and limitations, Gilbert Thompson provided informed consent to pursue genetic testing and the blood sample was sent to [facility] for analysis of the Custom Panel. Results should be available within approximately 2-3 weeks' time, at which point they will be disclosed by telephone to Gilbert Thompson, as will any additional recommendations warranted by these results. Gilbert Thompson will receive a summary of his genetic counseling visit and a copy of his results once available. This information will also be available in Epic.   Gilbert Thompson questions were answered to his satisfaction today. Our contact information was provided should additional questions or concerns arise. Thank you for the referral and allowing [place] to share in the care of your patient.   Lalla Brothers, MS, [facility] Genetic Counselor [email or web address] (P) [phone]  The patient was seen for a total of 20  minutes in face-to-face genetic counseling.  The patient brought his wife.  Drs. Pamelia Hoit and/or Mosetta Putt were available to discuss this case as needed.   _______________________________________________________________________ For Office Staff:  Number of people involved in session: 2 Was an Intern/ student involved with case: no

## 2023-03-30 NOTE — Progress Notes (Signed)
Radiation Oncology         [phone] ________________________________  Multidisciplinary Prostate Cancer Clinic  Radiation Oncology Consultation  Name: Gilbert Thompson MRN: [id number]  Date: 03/30/2023  DOB: Jul 12, 1951  [id number], Theron Arista, MD  Heloise Purpura, MD   REFERRING PHYSICIAN: Heloise Purpura, MD  DIAGNOSIS: 72 y.o. gentleman with Stage T2a adenocarcinoma of the prostate with Gleason score of 4+4, and PSA of 6.00 (adjusted for finasteride).    ICD-10-CM   1. Malignant neoplasm of prostate (HCC)  C61       HISTORY OF PRESENT ILLNESS: Gilbert Thompson is a 72 y.o. male with a diagnosis of prostate cancer. He was noted to have an elevated PSA of 2.98 (on finasteride)  by his primary care physician, Dr. Duaine Dredge.  Accordingly, he was referred for evaluation in urology by Dr. Laverle Patter on 01/06/23,  digital rectal examination was performed at that time revealing 1.5 cm right mid prostate nodule.  The patient proceeded to transrectal ultrasound with 14 biopsies of the prostate on 01/19/23.  The prostate volume measured 19.5 cc.  Out of 14 core biopsies, 5 were positive.  The maximum Gleason score was 4+4, and this was seen in the ROI #1 core. Additionally, Gleason score 3+4 was seen in the ROI #2 core, right base, right mid, and right mid lateral.       PSMA PET on 02/22/23 showed two foci of intense radiotracer activity in the prostate gland with no evidence of metastatic disease.   The patient reviewed the biopsy and imaging results with his urologist and was kindly referred to [place] for discussion of potential radiation treatment options. We initially met the patient in consult on 02/24/23 but remained undecided on his treatment choice, between prostatectomy and ADT combined with 8 weeks of external radiation. He wanted to discuss his options with his family and his PCP and has now been referred to the multidisciplinary prostate clinic [facility]) for further discussion of his options to help him  reach a decision for treatment.    PREVIOUS RADIATION THERAPY: No  PAST MEDICAL HISTORY:  Past Medical History:  Diagnosis Date   Asthma    GERD (gastroesophageal reflux disease)    Hyperlipidemia    Hypertension       PAST SURGICAL HISTORY: Past Surgical History:  Procedure Laterality Date   LEFT HEART CATH AND CORONARY ANGIOGRAPHY N/A 06/05/2022   Procedure: LEFT HEART CATH AND CORONARY ANGIOGRAPHY;  Surgeon: Lyn Records, MD;  Location: [facility];  Service: Cardiovascular;  Laterality: N/A;   PROSTATE BIOPSY     UPPER GASTROINTESTINAL ENDOSCOPY      FAMILY HISTORY:  Family History  Problem Relation Age of Onset   Coronary artery disease Father    Prostate cancer Father 78 - 79   Leukemia Father 12   Coronary artery disease Brother    Prostate cancer Brother 61 - 37   Colon cancer Brother 37   Pancreatic cancer Cousin        maternal first cousin   Breast cancer Daughter 43       negative genetic testing   Esophageal cancer Neg Hx    Rectal cancer Neg Hx    Stomach cancer Neg Hx     SOCIAL HISTORY:  Social History   Socioeconomic History   Marital status: Married    Spouse name: Not on file   Number of children: 1   Years of education: Not on file   Highest education level: Not on  file  Occupational History   Not on file  Tobacco Use   Smoking status: Never   Smokeless tobacco: Never  Vaping Use   Vaping status: Never Used  Substance and Sexual Activity   Alcohol use: Yes    Comment: 2 drinks per day   Drug use: Never   Sexual activity: Not on file  Other Topics Concern   Not on file  Social History Narrative   Not on file   Social Determinants of Health   Financial Resource Strain: Not on file  Food Insecurity: No Food Insecurity (03/30/2023)   Hunger Vital Sign    Worried About Running Out of Food in the Last Year: Never true    Ran Out of Food in the Last Year: Never true  Transportation Needs: No Transportation Needs (03/30/2023)    PRAPARE - Administrator, Civil Service (Medical): No    Lack of Transportation (Non-Medical): No  Physical Activity: Not on file  Stress: Not on file  Social Connections: Not on file  Intimate Partner Violence: Not At Risk (03/30/2023)   Humiliation, Afraid, Rape, and Kick questionnaire    Fear of Current or Ex-Partner: No    Emotionally Abused: No    Physically Abused: No    Sexually Abused: No    ALLERGIES: Patient has no known allergies.  MEDICATIONS:  Current Outpatient Medications  Medication Sig Dispense Refill   albuterol (VENTOLIN HFA) 108 (90 Base) MCG/ACT inhaler Inhale 1-2 puffs into the lungs every 6 (six) hours as needed for wheezing or shortness of breath.     allopurinol (ZYLOPRIM) 300 MG tablet Take 300 mg by mouth daily.     ALPRAZolam (XANAX) 1 MG tablet Take 1 mg by mouth at bedtime as needed for anxiety or sleep.     aspirin EC 81 MG tablet Take 1 tablet (81 mg total) by mouth daily. Swallow whole. 90 tablet 3   cholecalciferol (VITAMIN D3) 25 MCG (1000 UNIT) tablet Take 1,000 Units by mouth daily.     clopidogrel (PLAVIX) 75 MG tablet Take 1 tablet (75 mg total) by mouth daily. 90 tablet 3   ezetimibe (ZETIA) 10 MG tablet Take 1 tablet (10 mg total) by mouth daily. 90 tablet 3   finasteride (PROSCAR) 5 MG tablet 1.25 mg daily.     HYDROcodone-acetaminophen (NORCO/VICODIN) 5-325 MG tablet Take 1 tablet by mouth every 6 (six) hours as needed for severe pain.     losartan (COZAAR) 50 MG tablet Take 1 tablet (50 mg total) by mouth daily. 90 tablet 3   metoprolol succinate (TOPROL XL) 25 MG 24 hr tablet Take 1 tablet (25 mg total) by mouth daily. 30 tablet 11   nitroGLYCERIN (NITROSTAT) 0.4 MG SL tablet Place 1 tablet (0.4 mg total) under the tongue every 5 (five) minutes as needed for chest pain. 90 tablet 3   pantoprazole (PROTONIX) 40 MG tablet Take 1 tablet (40 mg total) by mouth 2 (two) times daily. 180 tablet 3   rosuvastatin (CRESTOR) 40 MG tablet  Take 1 tablet (40 mg total) by mouth daily. 90 tablet 3   TRELEGY ELLIPTA 100-62.5-25 MCG/ACT AEPB Inhale 1 puff into the lungs daily.     No current facility-administered medications for this encounter.    REVIEW OF SYSTEMS:  On review of systems, the patient reports that he is doing well overall. He denies any chest pain, shortness of breath, cough, fevers, chills, night sweats, unintended weight changes. He denies any bowel disturbances,  and denies abdominal pain, nausea or vomiting. He denies any new musculoskeletal or joint aches or pains. His IPSS was 3, indicating mild urinary symptoms.  His SHIM was 8, indicating he has moderate erectile dysfunction. A complete review of systems is obtained and is otherwise negative.   PHYSICAL EXAM:  Wt Readings from Last 3 Encounters:  03/30/23 195 lb (88.5 kg)  03/19/23 196 lb 9.6 oz (89.2 kg)  02/24/23 194 lb (88 kg)   Temp Readings from Last 3 Encounters:  03/30/23 97.7 F (36.5 C)  02/24/23 98.2 F (36.8 C) (Oral)  08/20/22 (!) 97.1 F (36.2 C) (Temporal)   BP Readings from Last 3 Encounters:  03/30/23 (!) 125/56  03/19/23 132/84  02/24/23 124/63   Pulse Readings from Last 3 Encounters:  03/30/23 (!) 49  03/19/23 (!) 52  02/24/23 (!) 52    /10  In general this is a well appearing Caucasian gentleman in no acute distress. He's alert and oriented x4 and appropriate throughout the examination. Cardiopulmonary assessment is negative for acute distress, and he exhibits normal effort.    KPS = 100  100 - Normal; no complaints; no evidence of disease. 90   - Able to carry on normal activity; minor signs or symptoms of disease. 80   - Normal activity with effort; some signs or symptoms of disease. 56   - Cares for self; unable to carry on normal activity or to do active work. 60   - Requires occasional assistance, but is able to care for most of his personal needs. 50   - Requires considerable assistance and frequent medical care. 40    - Disabled; requires special care and assistance. 30   - Severely disabled; hospital admission is indicated although death not imminent. 20   - Very sick; hospital admission necessary; active supportive treatment necessary. 10   - Moribund; fatal processes progressing rapidly. 0     - Dead  Karnofsky DA, Abelmann WH, Craver LS and Burchenal [facility] [phone]) The use of the nitrogen mustards in the palliative treatment of carcinoma: with particular reference to bronchogenic carcinoma Cancer 1 634-56  LABORATORY DATA:  Lab Results  Component Value Date   WBC 6.4 03/29/2023   HGB 13.4 03/29/2023   HCT 39.8 03/29/2023   MCV 93 03/29/2023   PLT 250 03/29/2023   Lab Results  Component Value Date   NA 139 03/29/2023   K 4.1 03/29/2023   CL 103 03/29/2023   CO2 25 03/29/2023   Lab Results  Component Value Date   ALT 18 03/29/2023   AST 23 03/29/2023   ALKPHOS 62 03/29/2023   BILITOT 0.5 03/29/2023     RADIOGRAPHY: No results found.    IMPRESSION/PLAN: 1. 72 y.o. gentleman with Stage T2 adenocarcinoma of the prostate with Gleason score of 4+4, and PSA of 6.00 (2.98 on finasteride).  We again discussed the patient's workup and outlined the nature of prostate cancer in this setting. The patient's T stage, Gleason's score, and PSA put him into the high risk group. Accordingly, he is eligible for a variety of potential treatment options including ADT in combination with 8 weeks of external radiation or prostatectomy. We discussed the available radiation techniques, and focused on the details and logistics of delivery. Given his small prostate size, he is not a candidate for ADT combined with upfront brachytherapy boost. We reviewed the risks, benefits, short and long-term effects associated with radiotherapy and compared and contrasted these with prostatectomy. We also reviewed the  role of ADT in the treatment of high risk prostate cancer and outlined the associated side effects that could be  expected with this therapy.     The patient focused most of his questions and interest in robotic-assisted laparoscopic radical prostatectomy.  We discussed some of the potential advantages of surgery including surgical staging, the availability of salvage radiotherapy to the prostatic fossa, and the confidence associated with immediate biochemical response.  We discussed some of the potential proven indications for postoperative radiotherapy including positive margins, extracapsular extension, and seminal vesicle involvement. We also talked about some of the other potential findings leading to a recommendation for radiotherapy including a non-zero postoperative PSA and positive lymph nodes. He appears to have a good understanding of his disease and our treatment recommendations which are of curative intent.  He was encouraged to ask questions that were answered to his stated satisfaction.  At the conclusion of our conversation, the patient elects to proceed with prostatectomy. We will share our discussion with Dr. Laverle Patter so that he can move forward with surgical scheduling and will look forward to following his progress. We enjoyed meeting him and his wife again today and would be more than happy to continue to participate in his care if clinically indicated in the future.  We personally spent 60 minutes in this encounter including chart review, reviewing radiological studies, meeting face-to-face with the patient, entering orders and completing documentation.   Marguarite Arbour, PA-C    Margaretmary Dys, MD  [facility]  Radiation Oncology Direct Dial: [phone]  Fax: [phone] [email or web address]  Skype  LinkedIn

## 2023-03-31 ENCOUNTER — Telehealth: Payer: Self-pay

## 2023-03-31 DIAGNOSIS — M6281 Muscle weakness (generalized): Secondary | ICD-10-CM | POA: Diagnosis not present

## 2023-03-31 DIAGNOSIS — C61 Malignant neoplasm of prostate: Secondary | ICD-10-CM | POA: Diagnosis not present

## 2023-03-31 NOTE — Telephone Encounter (Addendum)
Called patient regarding results. Unable to leave message line busy.----- Message from Jodelle Gross, NP sent at 03/30/2023  2:30 PM EDT ----- I have reviewed labs. All are normal. Cholesterol very well controlled. Good report. No changes in regime   KL

## 2023-04-01 ENCOUNTER — Other Ambulatory Visit: Payer: Self-pay | Admitting: Urology

## 2023-04-01 ENCOUNTER — Telehealth: Payer: Self-pay | Admitting: Cardiology

## 2023-04-01 NOTE — Telephone Encounter (Signed)
   Pre-operative Risk Assessment    Patient Name: Gilbert Thompson  DOB: [birth year] MRN: [id number]      Request for Surgical Clearance    Procedure:   Robot assisted laparoscopic radical prostatectomy and bi-lateral pelvic laminectomy.  Date of Surgery:  Clearance 04/29/23                                 Surgeon:  Dr. Nancie Neas Surgeon's Group or Practice Name:  [facility] Phone number:  [phone]  Fax number:  [phone]   Type of Clearance Requested:   - Medical  - Pharmacy:  Hold Clopidogrel (Plavix)     Type of Anesthesia:  General    Additional requests/questions:   Caller requesting to hold Plavix for 5 days.  Signed, Annetta Maw   04/01/2023, 12:43 PM

## 2023-04-02 DIAGNOSIS — M62838 Other muscle spasm: Secondary | ICD-10-CM | POA: Diagnosis not present

## 2023-04-02 DIAGNOSIS — N393 Stress incontinence (female) (male): Secondary | ICD-10-CM | POA: Diagnosis not present

## 2023-04-02 DIAGNOSIS — M6281 Muscle weakness (generalized): Secondary | ICD-10-CM | POA: Diagnosis not present

## 2023-04-02 NOTE — Telephone Encounter (Signed)
   Patient Name: Gilbert Thompson  DOB: [birth year] MRN: [id number]  Primary Cardiologist: Olga Millers, MD  Chart reviewed as part of pre-operative protocol coverage. Pre-op clearance already addressed by colleagues in earlier phone notes. To summarize recommendations:  -He is completely asymptomatic able to walk 18 holes playing golf 3 times a week without recurrence of symptoms.  If the patient does proceed with prostate surgery he will need to hold Plavix for 5 days preoperatively, aspirin 72 hours prior to surgery.  He is he is given refills on clopidogrel today.  -Joni Reining, NP  Will route this bundled recommendation to requesting provider via Epic fax function and remove from pre-op pool. Please call with questions.  Sharlene Dory, PA-C 04/02/2023, 7:58 AM

## 2023-04-06 NOTE — Progress Notes (Signed)
RN left message for call back to follow up from recent [facility].

## 2023-04-06 NOTE — Progress Notes (Signed)
HPI: Follow-up coronary artery disease and dyspnea.  Coronary CTA August 2023 showed calcium score 620 which was 74th percentile, moderate to severe stenosis in the mid LAD with otherwise mild nonobstructive coronary disease; there was note of 4 mm pulmonary nodule in the right middle lobe.  FFR abnormal in the LAD distribution.  Echocardiogram August 2023 showed normal LV function, severe left ventricular hypertrophy, grade 1 diastolic dysfunction, mild mitral regurgitation, trace aortic insufficiency.  Cardiac catheterization performed September 2023 showed 75% followed by 70% lesions and left ventricular end-diastolic pressure 10.  Medical therapy recommended with high risk PCI if symptoms cannot be controlled.  Since last seen the patient has dyspnea with more extreme activities but not with routine activities. It is relieved with rest. It is not associated with chest pain. There is no orthopnea, PND or pedal edema. There is no syncope or palpitations. There is no exertional chest pain. Patient states he can walk 5 miles during normal temperatures with no dyspnea or chest pain.  Current Outpatient Medications  Medication Sig Dispense Refill   albuterol (VENTOLIN HFA) 108 (90 Base) MCG/ACT inhaler Inhale 1-2 puffs into the lungs every 6 (six) hours as needed for wheezing or shortness of breath.     allopurinol (ZYLOPRIM) 300 MG tablet Take 300 mg by mouth daily.     ALPRAZolam (XANAX) 1 MG tablet Take 1 mg by mouth at bedtime as needed for anxiety or sleep.     aspirin EC 81 MG tablet Take 1 tablet (81 mg total) by mouth daily. Swallow whole. 90 tablet 3   cholecalciferol (VITAMIN D3) 25 MCG (1000 UNIT) tablet Take 1,000 Units by mouth daily.     clopidogrel (PLAVIX) 75 MG tablet Take 1 tablet (75 mg total) by mouth daily. 90 tablet 3   ezetimibe (ZETIA) 10 MG tablet Take 1 tablet (10 mg total) by mouth daily. 90 tablet 3   finasteride (PROSCAR) 5 MG tablet 1.25 mg daily.      HYDROcodone-acetaminophen (NORCO/VICODIN) 5-325 MG tablet Take 1 tablet by mouth every 6 (six) hours as needed for severe pain.     losartan (COZAAR) 50 MG tablet Take 1 tablet (50 mg total) by mouth daily. 90 tablet 3   metoprolol succinate (TOPROL XL) 25 MG 24 hr tablet Take 1 tablet (25 mg total) by mouth daily. 30 tablet 11   pantoprazole (PROTONIX) 40 MG tablet Take 1 tablet (40 mg total) by mouth 2 (two) times daily. 180 tablet 3   rosuvastatin (CRESTOR) 40 MG tablet Take 1 tablet (40 mg total) by mouth daily. 90 tablet 3   TRELEGY ELLIPTA 100-62.5-25 MCG/ACT AEPB Inhale 1 puff into the lungs daily.     nitroGLYCERIN (NITROSTAT) 0.4 MG SL tablet Place 1 tablet (0.4 mg total) under the tongue every 5 (five) minutes as needed for chest pain. 90 tablet 3   No current facility-administered medications for this visit.     Past Medical History:  Diagnosis Date   Asthma    GERD (gastroesophageal reflux disease)    Hyperlipidemia    Hypertension     Past Surgical History:  Procedure Laterality Date   LEFT HEART CATH AND CORONARY ANGIOGRAPHY N/A 06/05/2022   Procedure: LEFT HEART CATH AND CORONARY ANGIOGRAPHY;  Surgeon: Lyn Records, MD;  Location: [facility];  Service: Cardiovascular;  Laterality: N/A;   PROSTATE BIOPSY     UPPER GASTROINTESTINAL ENDOSCOPY      Social History   Socioeconomic History  Marital status: Married    Spouse name: Not on file   Number of children: 1   Years of education: Not on file   Highest education level: Not on file  Occupational History   Not on file  Tobacco Use   Smoking status: Never   Smokeless tobacco: Never  Vaping Use   Vaping status: Never Used  Substance and Sexual Activity   Alcohol use: Yes    Comment: 2 drinks per day   Drug use: Never   Sexual activity: Not on file  Other Topics Concern   Not on file  Social History Narrative   Not on file   Social Determinants of Health   Financial Resource Strain: Not on  file  Food Insecurity: No Food Insecurity (03/30/2023)   Hunger Vital Sign    Worried About Running Out of Food in the Last Year: Never true    Ran Out of Food in the Last Year: Never true  Transportation Needs: No Transportation Needs (03/30/2023)   PRAPARE - Administrator, Civil Service (Medical): No    Lack of Transportation (Non-Medical): No  Physical Activity: Not on file  Stress: Not on file  Social Connections: Not on file  Intimate Partner Violence: Not At Risk (03/30/2023)   Humiliation, Afraid, Rape, and Kick questionnaire    Fear of Current or Ex-Partner: No    Emotionally Abused: No    Physically Abused: No    Sexually Abused: No    Family History  Problem Relation Age of Onset   Coronary artery disease Father    Prostate cancer Father 67 - 56   Leukemia Father 109   Coronary artery disease Brother    Prostate cancer Brother 72 - 59   Colon cancer Brother 68   Pancreatic cancer Cousin        maternal first cousin   Breast cancer Daughter 3       negative genetic testing   Esophageal cancer Neg Hx    Rectal cancer Neg Hx    Stomach cancer Neg Hx     ROS: no fevers or chills, productive cough, hemoptysis, dysphasia, odynophagia, melena, hematochezia, dysuria, hematuria, rash, seizure activity, orthopnea, PND, pedal edema, claudication. Remaining systems are negative.  Physical Exam: Well-developed well-nourished in no acute distress.  Skin is warm and dry.  HEENT is normal.  Neck is supple.  Chest is clear to auscultation with normal expansion.  Cardiovascular exam is regular rate and rhythm.  Abdominal exam nontender or distended. No masses palpated. Extremities show no edema. neuro grossly intact   A/P  1 coronary artery disease-patient denies chest pain.  Continue aspirin, beta-blocker and statin.  Will hold Plavix given upcoming surgery.  2 hypertension-blood pressure is controlled.  Continue present medications.  3 hyperlipidemia-continue  statin.  4 preoperative evaluation prior to prostatectomy-patient has good functional capacity (able to walk 5 miles) with no symptoms of chest pain or dyspnea.  He may proceed without further ischemia evaluation.  Hold Plavix 7 days prior to surgery.  Urology also requested he stop aspirin several days prior to surgery.  Olga Millers, MD

## 2023-04-12 ENCOUNTER — Telehealth: Payer: Self-pay

## 2023-04-12 NOTE — Telephone Encounter (Addendum)
Results viewed by patient via Mychart.----- Message from Joni Reining sent at 03/30/2023  2:30 PM EDT ----- I have reviewed labs. All are normal. Cholesterol very well controlled. Good report. No changes in regime   KL

## 2023-04-14 ENCOUNTER — Encounter: Payer: Self-pay | Admitting: Cardiology

## 2023-04-14 ENCOUNTER — Ambulatory Visit: Payer: Medicare HMO | Admitting: Cardiology

## 2023-04-14 VITALS — BP 100/58 | HR 50 | Ht 69.0 in | Wt 193.1 lb

## 2023-04-14 DIAGNOSIS — I2510 Atherosclerotic heart disease of native coronary artery without angina pectoris: Secondary | ICD-10-CM

## 2023-04-14 DIAGNOSIS — I10 Essential (primary) hypertension: Secondary | ICD-10-CM

## 2023-04-14 DIAGNOSIS — Z01818 Encounter for other preprocedural examination: Secondary | ICD-10-CM

## 2023-04-14 DIAGNOSIS — E785 Hyperlipidemia, unspecified: Secondary | ICD-10-CM | POA: Diagnosis not present

## 2023-04-14 NOTE — Patient Instructions (Signed)

## 2023-04-19 DIAGNOSIS — N401 Enlarged prostate with lower urinary tract symptoms: Secondary | ICD-10-CM | POA: Diagnosis not present

## 2023-04-19 DIAGNOSIS — Z01818 Encounter for other preprocedural examination: Secondary | ICD-10-CM | POA: Diagnosis not present

## 2023-04-19 DIAGNOSIS — C61 Malignant neoplasm of prostate: Secondary | ICD-10-CM | POA: Diagnosis not present

## 2023-04-19 NOTE — Progress Notes (Signed)
Anesthesia Review:  PCP: Cardiologist : Chest x-ray : 09/28/22- 2 view  EKG : 02/27/23  CT Cors- 05/07/22  Echo : 05/04/22  Stress test: Cardiac Cath :  06/05/22  Activity level:  Sleep Study/ CPAP : Fasting Blood Sugar :      / Checks Blood Sugar -- times a day:   Blood Thinner/ Instructions /Last Dose: ASA / Instructions/ Last Dose :    81 mg aspirin  Plavix-

## 2023-04-19 NOTE — Patient Instructions (Signed)
SURGICAL WAITING ROOM VISITATION  Patients having surgery or a procedure may have no more than 2 support people in the waiting area - these visitors may rotate.    Children under the age of 93 must have an adult with them who is not the patient.  Due to an increase in RSV and influenza rates and associated hospitalizations, children ages 48 and under may not visit patients in [facility] hospitals.  If the patient needs to stay at the hospital during part of their recovery, the visitor guidelines for inpatient rooms apply. Pre-op nurse will coordinate an appropriate time for 1 support person to accompany patient in pre-op.  This support person may not rotate.    Please refer to the [facility] website for the visitor guidelines for Inpatients (after your surgery is over and you are in a regular room).       Your procedure is scheduled on:  04/29/23    Report to [facility] Main Entrance    Report to admitting at   [phone]   Call this number if you have problems the morning of surgery [phone]             Clear liquid diet the day before surgery.              Magnesium Citrate 8 ounces at 12 noon day before surgery.             Fleets enema nite before surgery    Do not eat food  or drink liquids :After Midnight.    Water Non-Citrus Juices (without pulp, NO RED-Apple, White grape, White cranberry) Black Coffee (NO MILK/CREAM OR CREAMERS, sugar ok)  Clear Tea (NO MILK/CREAM OR CREAMERS, sugar ok) regular and decaf                             Plain Jell-O (NO RED)                                           Fruit ices (not with fruit pulp, NO RED)                                     Popsicles (NO RED)                                                               Sports drinks like Gatorade (NO RED)                          If you have questions, please contact your surgeon's office.       Oral Hygiene is also important to reduce your risk of infection.                                     Remember - BRUSH YOUR TEETH THE MORNING OF SURGERY WITH YOUR REGULAR TOOTHPASTE  DENTURES WILL BE REMOVED PRIOR TO SURGERY PLEASE DO NOT APPLY "  Poly grip" OR ADHESIVES!!!   Do NOT smoke after Midnight   Stop all vitamins and herbal supplements 7 days before surgery.   Take these medicines the morning of surgery with A SIP OF WATER:  inhalers as usual and bring, allopurinol, porscar, torpol, proonix   DO NOT TAKE ANY ORAL DIABETIC MEDICATIONS DAY OF YOUR SURGERY  Bring CPAP mask and tubing day of surgery.                              You may not have any metal on your body including hair pins, jewelry, and body piercing             Do not wear make-up, lotions, powders, perfumes/cologne, or deodorant  Do not wear nail polish including gel and S&S, artificial/acrylic nails, or any other type of covering on natural nails including finger and toenails. If you have artificial nails, gel coating, etc. that needs to be removed by a nail salon please have this removed prior to surgery or surgery may need to be canceled/ delayed if the surgeon/ anesthesia feels like they are unable to be safely monitored.   Do not shave  48 hours prior to surgery.               Men may shave face and neck.   Do not bring valuables to the hospital. [facility] IS NOT             RESPONSIBLE   FOR VALUABLES.   Contacts, glasses, dentures or bridgework may not be worn into surgery.   Bring small overnight bag day of surgery.   DO NOT BRING YOUR HOME MEDICATIONS TO THE HOSPITAL. PHARMACY WILL DISPENSE MEDICATIONS LISTED ON YOUR MEDICATION LIST TO YOU DURING YOUR ADMISSION IN THE HOSPITAL!    Patients discharged on the day of surgery will not be allowed to drive home.  Someone NEEDS to stay with you for the first 24 hours after anesthesia.   Special Instructions: Bring a copy of your healthcare power of attorney and living will documents the day of surgery if you haven't scanned them  before.              Please read over the following fact sheets you were given: IF YOU HAVE QUESTIONS ABOUT YOUR PRE-OP INSTRUCTIONS PLEASE CALL [phone]   If you received a COVID test during your pre-op visit  it is requested that you wear a mask when out in public, stay away from anyone that may not be feeling well and notify your surgeon if you develop symptoms. If you test positive for Covid or have been in contact with anyone that has tested positive in the last 10 days please notify you surgeon.    Newborn - Preparing for Surgery Before surgery, you can play an important role.  Because skin is not sterile, your skin needs to be as free of germs as possible.  You can reduce the number of germs on your skin by washing with CHG (chlorahexidine gluconate) soap before surgery.  CHG is an antiseptic cleaner which kills germs and bonds with the skin to continue killing germs even after washing. Please DO NOT use if you have an allergy to CHG or antibacterial soaps.  If your skin becomes reddened/irritated stop using the CHG and inform your nurse when you arrive at Short Stay. Do not shave (including legs and underarms) for at least 48 hours prior  to the first CHG shower.  You may shave your face/neck. Please follow these instructions carefully:  1.  Shower with CHG Soap the night before surgery and the  morning of Surgery.  2.  If you choose to wash your hair, wash your hair first as usual with your  normal  shampoo.  3.  After you shampoo, rinse your hair and body thoroughly to remove the  shampoo.                           4.  Use CHG as you would any other liquid soap.  You can apply chg directly  to the skin and wash                       Gently with a scrungie or clean washcloth.  5.  Apply the CHG Soap to your body ONLY FROM THE NECK DOWN.   Do not use on face/ open                           Wound or open sores. Avoid contact with eyes, ears mouth and genitals (private parts).                        Wash face,  Genitals (private parts) with your normal soap.             6.  Wash thoroughly, paying special attention to the area where your surgery  will be performed.  7.  Thoroughly rinse your body with warm water from the neck down.  8.  DO NOT shower/wash with your normal soap after using and rinsing off  the CHG Soap.                9.  Pat yourself dry with a clean towel.            10.  Wear clean pajamas.            11.  Place clean sheets on your bed the night of your first shower and do not  sleep with pets. Day of Surgery : Do not apply any lotions/deodorants the morning of surgery.  Please wear clean clothes to the hospital/surgery center.  FAILURE TO FOLLOW THESE INSTRUCTIONS MAY RESULT IN THE CANCELLATION OF YOUR SURGERY PATIENT SIGNATURE_________________________________  NURSE SIGNATURE__________________________________  ________________________________________________________________________

## 2023-04-20 ENCOUNTER — Other Ambulatory Visit (HOSPITAL_COMMUNITY): Payer: Medicare HMO

## 2023-04-21 ENCOUNTER — Encounter (HOSPITAL_COMMUNITY): Payer: Self-pay

## 2023-04-21 ENCOUNTER — Other Ambulatory Visit: Payer: Self-pay

## 2023-04-21 ENCOUNTER — Encounter (HOSPITAL_COMMUNITY)
Admission: RE | Admit: 2023-04-21 | Discharge: 2023-04-21 | Disposition: A | Discharge status: Home / Self Care | Payer: Medicare HMO | Source: Ambulatory Visit | Attending: Urology | Admitting: Urology

## 2023-04-21 VITALS — BP 116/60 | HR 60 | Temp 98.7°F | Resp 16 | Ht 69.0 in | Wt 192.9 lb

## 2023-04-21 DIAGNOSIS — C61 Malignant neoplasm of prostate: Secondary | ICD-10-CM | POA: Insufficient documentation

## 2023-04-21 DIAGNOSIS — J45909 Unspecified asthma, uncomplicated: Secondary | ICD-10-CM | POA: Insufficient documentation

## 2023-04-21 DIAGNOSIS — I10 Essential (primary) hypertension: Secondary | ICD-10-CM | POA: Insufficient documentation

## 2023-04-21 DIAGNOSIS — I2510 Atherosclerotic heart disease of native coronary artery without angina pectoris: Secondary | ICD-10-CM | POA: Insufficient documentation

## 2023-04-21 DIAGNOSIS — Z01818 Encounter for other preprocedural examination: Secondary | ICD-10-CM | POA: Insufficient documentation

## 2023-04-21 HISTORY — DX: Pneumonia, unspecified organism: J18.9

## 2023-04-21 HISTORY — DX: Dyspnea, unspecified: R06.00

## 2023-04-21 HISTORY — DX: Malignant (primary) neoplasm, unspecified: C80.1

## 2023-04-21 LAB — CBC
HCT: 42.4 % (ref 39.0–52.0)
Hemoglobin: 14.3 g/dL (ref 13.0–17.0)
MCH: 31.8 pg (ref 26.0–34.0)
MCHC: 33.7 g/dL (ref 30.0–36.0)
MCV: 94.2 fL (ref 80.0–100.0)
Platelets: 213 10*3/uL (ref 150–400)
RBC: 4.5 MIL/uL (ref 4.22–5.81)
RDW: 13 % (ref 11.5–15.5)
WBC: 6.6 10*3/uL (ref 4.0–10.5)
nRBC: 0 % (ref 0.0–0.2)

## 2023-04-21 LAB — BASIC METABOLIC PANEL
Anion gap: 13 (ref 5–15)
BUN: 15 mg/dL (ref 8–23)
CO2: 23 mmol/L (ref 22–32)
Calcium: 9.4 mg/dL (ref 8.9–10.3)
Chloride: 99 mmol/L (ref 98–111)
Creatinine, Ser: 1.37 mg/dL — ABNORMAL HIGH (ref 0.61–1.24)
GFR, Estimated: 54 mL/min — ABNORMAL LOW (ref >60)
Glucose, Bld: 98 mg/dL (ref 70–99)
Potassium: 4.2 mmol/L (ref 3.5–5.1)
Sodium: 135 mmol/L (ref 135–145)

## 2023-04-21 LAB — TYPE AND SCREEN
ABO/RH(D): O POS
Antibody Screen: NEGATIVE

## 2023-04-22 NOTE — Progress Notes (Signed)
Anesthesia Chart Review   Case: [id number] Date/Time: 04/29/23 0700   Procedures:      XI ROBOTIC ASSISTED LAPAROSCOPIC RADICAL PROSTATECTOMY LEVEL 2 - 210 MINUTES NEEDED FOR CASE     BILATERAL PELVIC LYMPHADENECTOMY (Bilateral)   Anesthesia type: General   Pre-op diagnosis: PROSTATE CANCER   Location: [facility] ROOM 03 / [facility]   Surgeons: Heloise Purpura, MD       DISCUSSION:72 y.o. never smoker with h/o HTN, asthma, CAD (cardiac cath 05/2022 showed 75% followed by 70% lesions and left ventricular end-diastolic pressure 10.  Medical therapy recommended with high risk PCI if symptoms cannot be controlled), prostate cancer scheduled for above procedure 04/29/2023 with Dr. Heloise Purpura.   Pt last seen by cardiology 04/14/2023. Per OV note, "preoperative evaluation prior to prostatectomy-patient has good functional capacity (able to walk 5 miles) with no symptoms of chest pain or dyspnea.  He may proceed without further ischemia evaluation.  Hold Plavix 7 days prior to surgery.  Urology also requested he stop aspirin several days prior to surgery. "  VS: BP 116/60   Pulse 60   Temp 37.1 C (Oral)   Resp 16   Ht 5\' 9"  (1.753 m)   Wt 87.5 kg   SpO2 97%   BMI 28.49 kg/m   PROVIDERS: Mosetta Putt, MD is PCP   Olga Millers, MD is Cardiologist  LABS: Labs reviewed: Acceptable for surgery. (all labs ordered are listed, but only abnormal results are displayed)  Labs Reviewed  BASIC METABOLIC PANEL - Abnormal; Notable for the following components:      Result Value   Creatinine, Ser 1.37 (*)    GFR, Estimated 54 (*)    All other components within normal limits  CBC  TYPE AND SCREEN     IMAGES:   EKG:   CV: Echo 05/04/2022 1. Left ventricular ejection fraction, by estimation, is 60 to 65%. The  left ventricle has normal function. The left ventricle has no regional  wall motion abnormalities. There is severe concentric left ventricular  hypertrophy. Left ventricular diastolic    parameters are consistent with Grade I diastolic dysfunction (impaired  relaxation). Elevated left ventricular end-diastolic pressure.   2. Right ventricular systolic function is normal. The right ventricular  size is normal. There is normal pulmonary artery systolic pressure.   3. The mitral valve is normal in structure. Mild mitral valve  regurgitation. No evidence of mitral stenosis.   4. The aortic valve is tricuspid. There is mild calcification of the  aortic valve. There is mild thickening of the aortic valve. Aortic valve  regurgitation is trivial. Aortic valve sclerosis/calcification is present,  without any evidence of aortic  stenosis.   5. There is borderline dilatation of the aortic root, measuring 37 mm.   6. The inferior vena cava is normal in size with greater than 50%  respiratory variability, suggesting right atrial pressure of 3 mmHg.  Past Medical History:  Diagnosis Date   Asthma    Cancer [facility])    prostate cancer   Dyspnea    with exertion   GERD (gastroesophageal reflux disease)    Hyperlipidemia    Hypertension    Pneumonia     Past Surgical History:  Procedure Laterality Date   LEFT HEART CATH AND CORONARY ANGIOGRAPHY N/A 06/05/2022   Procedure: LEFT HEART CATH AND CORONARY ANGIOGRAPHY;  Surgeon: Lyn Records, MD;  Location[facility];  Service: Cardiovascular;  Laterality: N/A;   PROSTATE BIOPSY  UPPER GASTROINTESTINAL ENDOSCOPY      MEDICATIONS:  albuterol (VENTOLIN HFA) 108 (90 Base) MCG/ACT inhaler   allopurinol (ZYLOPRIM) 300 MG tablet   ALPRAZolam (XANAX) 1 MG tablet   aspirin EC 81 MG tablet   cholecalciferol (VITAMIN D3) 25 MCG (1000 UNIT) tablet   clopidogrel (PLAVIX) 75 MG tablet   ezetimibe (ZETIA) 10 MG tablet   finasteride (PROSCAR) 5 MG tablet   HYDROcodone-acetaminophen (NORCO/VICODIN) 5-325 MG tablet   ibuprofen (ADVIL) 200 MG tablet   losartan (COZAAR) 50 MG tablet   metoprolol succinate (TOPROL XL) 25 MG 24 hr  tablet   nitroGLYCERIN (NITROSTAT) 0.4 MG SL tablet   pantoprazole (PROTONIX) 40 MG tablet   rosuvastatin (CRESTOR) 40 MG tablet   Tetrahydrozoline HCl (REDNESS RELIEVER EYE DROPS OP)   TRELEGY ELLIPTA 100-62.5-25 MCG/ACT AEPB   No current facility-administered medications for this encounter.    Jodell Cipro Ward, PA-C [facility] Pre-Surgical Testing [phone]

## 2023-04-22 NOTE — Anesthesia Preprocedure Evaluation (Addendum)
Anesthesia Evaluation  Patient identified by MRN, date of birth, ID band Patient awake    Reviewed: Allergy & Precautions, NPO status , Patient's Chart, lab work & pertinent test results, reviewed documented beta blocker date and time   History of Anesthesia Complications Negative for: history of anesthetic complications  Airway Mallampati: III  TM Distance: >3 FB Neck ROM: Full    Dental no notable dental hx.    Pulmonary asthma    Pulmonary exam normal        Cardiovascular hypertension, Pt. on medications and Pt. on home beta blockers + CAD (on Plavix)  Normal cardiovascular exam  TTE 04/2022: EF 60-65%, severe LVH, grade I dDD, mild MR, borderline dilatation of  aortic root measuring 37mm    Neuro/Psych negative neurological ROS     GI/Hepatic Neg liver ROS,GERD  Medicated,,  Endo/Other  negative endocrine ROS    Renal/GU negative Renal ROS     Musculoskeletal negative musculoskeletal ROS (+)    Abdominal   Peds  Hematology negative hematology ROS (+)   Anesthesia Other Findings Prostate ca  Reproductive/Obstetrics                              Anesthesia Physical Anesthesia Plan  ASA: 3  Anesthesia Plan: General   Post-op Pain Management: Tylenol PO (pre-op)* and Ketamine IV*   Induction: Intravenous  PONV Risk Score and Plan: 2 and Treatment may vary due to age or medical condition, Ondansetron, Dexamethasone and Midazolam  Airway Management Planned: Oral ETT  Additional Equipment: None  Intra-op Plan:   Post-operative Plan: Extubation in OR  Informed Consent: I have reviewed the patients History and Physical, chart, labs and discussed the procedure including the risks, benefits and alternatives for the proposed anesthesia with the patient or authorized representative who has indicated his/her understanding and acceptance.     Dental advisory given  Plan Discussed  with: CRNA  Anesthesia Plan Comments: (See PAT note 04/21/2023)        Anesthesia Quick Evaluation

## 2023-04-23 ENCOUNTER — Telehealth: Payer: Self-pay | Admitting: Genetic Counselor

## 2023-04-23 ENCOUNTER — Encounter: Payer: Self-pay | Admitting: Genetic Counselor

## 2023-04-23 DIAGNOSIS — Z1379 Encounter for other screening for genetic and chromosomal anomalies: Secondary | ICD-10-CM | POA: Insufficient documentation

## 2023-04-23 NOTE — Telephone Encounter (Signed)
I attempted to contact Gilbert Thompson to discuss his genetic testing results (43 genes). I left a voicemail requesting he call me back at [phone].  Lalla Brothers, MS, [facility] Genetic Counselor [email or web address] (P) [phone],

## 2023-04-26 ENCOUNTER — Ambulatory Visit: Payer: Self-pay | Admitting: Genetic Counselor

## 2023-04-26 ENCOUNTER — Encounter: Payer: Self-pay | Admitting: Genetic Counselor

## 2023-04-26 ENCOUNTER — Telehealth: Payer: Self-pay | Admitting: Genetic Counselor

## 2023-04-26 DIAGNOSIS — Z1379 Encounter for other screening for genetic and chromosomal anomalies: Secondary | ICD-10-CM

## 2023-04-26 NOTE — Telephone Encounter (Signed)
I contacted Mr. Olheiser to discuss his genetic testing results. I spoke with his wife who accompanied him to the genetic counseling appointment. No pathogenic variants were identified in the 43 genes analyzed. She gave consent to upload the results to MyChart and Mr. Toledo will contact me if he has any questions. Detailed clinic note to follow.  The test report has been scanned into EPIC and is located under the Molecular Pathology section of the Results Review tab.  A portion of the result report is included below for reference.   Lalla Brothers, MS, [facility] Genetic Counselor [email or web address] (P) [phone]

## 2023-04-26 NOTE — Progress Notes (Signed)
HPI:   Mr. Burlin was previously seen in the  Cancer Genetics clinic due to a personal and family history of cancer and concerns regarding a hereditary predisposition to cancer. Please refer to our prior cancer genetics clinic note for more information regarding our discussion, assessment and recommendations, at the time. Mr. Orris recent genetic test results were disclosed to his wife. These results and recommendations are discussed in more detail below.  CANCER HISTORY:  Oncology History  Malignant neoplasm of prostate (HCC)  01/19/2023 Cancer Staging   Staging form: Prostate, AJCC 8th Edition - Clinical stage from 01/19/2023: Stage IIC (cT2a, cN0, cM0, PSA: 6, Grade Group: 4) - Signed by Marcello Fennel, PA-C on 03/30/2023 Histopathologic type: Adenocarcinoma, NOS Stage prefix: Initial diagnosis Prostate specific antigen (PSA) range: Less than 10 Gleason primary pattern: 4 Gleason secondary pattern: 4 Gleason score: 8 Histologic grading system: 5 grade system Number of biopsy cores examined: 14 Number of biopsy cores positive: 5 Location of positive needle core biopsies: One side   02/24/2023 Initial Diagnosis   Malignant neoplasm of prostate (HCC)     FAMILY HISTORY:  We obtained a detailed, 4-generation family history.  Significant diagnoses are listed below:      Family History  Problem Relation Age of Onset   Coronary artery disease Father     Prostate cancer Father 79 - 43   Leukemia Father 19   Coronary artery disease Brother     Prostate cancer Brother 44 - 59   Colon cancer Brother 40   Pancreatic cancer Cousin          maternal first cousin   Breast cancer Daughter 63        negative genetic testing   Esophageal cancer Neg Hx     Rectal cancer Neg Hx     Stomach cancer Neg Hx                 Mr. Kunis daughter was diagnosed with breast cancer at age 68, she reports negative genetic testing. He has two brothers and one was diagnosed with  prostate cancer in his late 38s and colon cancer at age 61, he died at age 63. His father was diagnosed with prostate cancer in his 2s and leukemia at age 32, he died at age 6. His maternal first cousin was diagnosed with pancreatic cancer in his 109s, he is deceased.    GENETIC TEST RESULTS:  The [facility] Custom Panel found no pathogenic mutations.   The Custom Hereditary Cancers Panel offered by [facility] includes sequencing and/or deletion duplication testing of the following 43 genes: APC, ATM, AXIN2, BAP1, BARD1, BMPR1A, BRCA1, BRCA2, BRIP1, CDH1, CDK4, CDKN2A (p14ARF and p16INK4a only), CHEK2, CTNNA1, EPCAM (Deletion/duplication testing only), FH, GREM1 (promoter region duplication testing only), HOXB13, KIT, MBD4, MEN1, MLH1, MSH2, MSH3, MSH6, MUTYH, NF1, NHTL1, PALB2, PDGFRA, PMS2, POLD1, POLE, PTEN, RAD51C, RAD51D, SMAD4, SMARCA4. STK11, TP53, TSC1, TSC2, and VHL.  The test report has been scanned into EPIC and is located under the Molecular Pathology section of the Results Review tab.  A portion of the result report is included below for reference. Genetic testing reported out on 04/06/2023.     Even though a pathogenic variant was not identified, possible explanations for the cancer in the family may include: There may be no hereditary risk for cancer in the family. The cancers in Mr. Fischl and/or his family may be due to other genetic or environmental factors. There may be a gene mutation in  one of these genes that current testing methods cannot detect, but that chance is small. There could be another gene that has not yet been discovered, or that we have not yet tested, that is responsible for the cancer diagnoses in the family.  It is also possible there is a hereditary cause for the cancer in the family that Mr. Stailey did not inherit.  Therefore, it is important to remain in touch with cancer genetics in the future so that we can continue to offer Mr. Taha the most up to date  genetic testing.   ADDITIONAL GENETIC TESTING:  We discussed with Mr. Zidek that his genetic testing was fairly extensive.  If there are genes identified to increase cancer risk that can be analyzed in the future, we would be happy to discuss and coordinate this testing at that time.    CANCER SCREENING RECOMMENDATIONS:  Mr. Miyata test result is considered negative (normal).  This means that we have not identified a hereditary cause for his personal and family history of cancer at this time.   An individual's cancer risk and medical management are not determined by genetic test results alone. Overall cancer risk assessment incorporates additional factors, including personal medical history, family history, and any available genetic information that may result in a personalized plan for cancer prevention and surveillance. Therefore, it is recommended he continue to follow the cancer management and screening guidelines provided by his oncology and primary healthcare provider.  RECOMMENDATIONS FOR FAMILY MEMBERS:   Since he did not inherit a mutation in a cancer predisposition gene included on this panel, his daughter could not have inherited a mutation from him in one of these genes.  FOLLOW-UP:  Cancer genetics is a rapidly advancing field and it is possible that new genetic tests will be appropriate for him and/or his family members in the future. We encourage him to remain in contact with cancer genetics on an annual basis so we can update his personal and family histories and let him know of advances in cancer genetics that may benefit this family.   Our contact number was provided. Mr. Abadi questions were answered to his satisfaction, and he knows he is welcome to call us at anytime with additional questions or concerns.   Lalla Brothers, MS, [facility] Genetic Counselor [email or web address] (P) [phone]

## 2023-04-28 NOTE — H&P (Signed)
Multi-Disciplinary Clinic     03/30/2023   --------------------------------------------------------------------------------   Gilbert Thompson. [facility]  MRN: [id number]  DOB: 07-14-1951, 72 year old Male  SSN:    PRIMARY CARE:  Carolyne Fiscal, MD  PRIMARY CARE FAX:  [phone]  REFERRING:  Azucena Kuba  PROVIDER:  Heloise Purpura, M.D.  LOCATION:  [facility] [phone]     --------------------------------------------------------------------------------   CC/HPI: CC: Prostate Cancer   PCP: Dr. Mosetta Putt  Location of consult: [facility] - Prostate Cancer Multidisciplinary Clinic    Gilbert Thompson is a 72 year old gentleman who was noted to have an elevated PSA of 2.98 (on finasteride) and a 1.5 cm right mid prostate nodule. He underwent a TRUS biopsy of the prostate on 01/19/23 and his ultrasound at the time of biopsy clearing indicated a hypoechoic 1 cm lesion that correlated with his right mid nodule. 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 He returns today after going back and forth between his decisions for treatment between long-term ADT/radiation and surgery.   Family history: None.   Imaging studies: PSMA PET scan (02/22/23) - Negative for metastatic disease.   PMH: He has a history of anxiety, asthma, GERD, gout, hypertension, CAD, and hyperlipidemia.  PSH: No abdominal surgeries.   TNM stage: cT2a N0 M0 (right mid nodule)  PSA: 6.00 (2.98 on finasteride)  Gleason score: 4+4=8 (GG 4)  Biopsy (01/19/23): 5/14 cores positive  Left: Benign  Right: R mid (90%, 4+3=7, PNI), R lateral mid (70%, 3+4=7), R base (5%, 3+4=7)  Nodule at right mid: 2/2 cores positive (40%, 4+4=8 and 70%, 4+3=7)  Prostate volume: 19.5 cc    Nomogram  OC disease: 36%  EPE: 62%  SVI: 12%  LNI: 15%  PFS (5 year, 10 year): 44%, 29%   Urinary function: IPSS is 4.  Erectile function: SHIM score is 2.     ALLERGIES: No Allergies    MEDICATIONS: Allopurinol  Aspirin  Finasteride  Levofloxacin 750 mg tablet Please take one tablet the morning of your biopsy.  Metoprolol Succinate  Albuterol Sulfate  Alprazolam  Cholecalciferol  Clopidogrel  Ezetimibe  Fluticasone-Vilanterol  Hydrocodone-Acetaminophen  Losartan Potassium  Nitroglycerin  Pantoprazole Sodium  Rosuvastatin Calcium     GU PSH: Prostate Needle Biopsy - 01/19/2023     NON-GU PSH: Surgical Pathology, Gross And Microscopic Examination For Prostate Needle - 01/19/2023     GU PMH: Prostate Cancer - 02/08/2023 Elevated PSA - 01/19/2023, - 01/06/2023 Prostate nodule w/o LUTS - 01/19/2023, - 01/06/2023    NON-GU PMH: Anxiety Asthma Coronary Artery Disease GERD Gout Hypercholesterolemia Hypertension    FAMILY HISTORY: 1 Daughter - Runs in Family   SOCIAL HISTORY: Marital Status: Married Preferred Language: English; Ethnicity: Not Hispanic Or Latino; Race: White Current Smoking Status: Patient has never smoked.   Tobacco Use Assessment Completed: Used Tobacco in last 30 days? Does drink.     REVIEW OF SYSTEMS:    GU Review Male:   Patient denies frequent urination, hard to postpone urination, burning/ pain with urination, get up at night to urinate, leakage of urine, stream starts and stops, trouble starting your streams, and have to strain to urinate .  Gastrointestinal (Upper):   Patient denies nausea and vomiting.  Gastrointestinal (Lower):   Patient denies diarrhea and constipation.  Constitutional:   Patient denies fever, night sweats, weight loss, and fatigue.  Skin:   Patient denies skin rash/ lesion and itching.  Eyes:   Patient denies blurred vision and double vision.  Ears/ Nose/ Throat:   Patient denies sore throat and sinus problems.   Hematologic/Lymphatic:   Patient denies swollen glands and easy bruising.  Cardiovascular:   Patient denies leg swelling and chest pains.  Respiratory:   Patient denies cough and shortness of breath.  Endocrine:   Patient denies excessive thirst.  Musculoskeletal:   Patient denies back pain and joint pain.  Neurological:   Patient denies headaches and dizziness.  Psychologic:   Patient denies depression and anxiety.   VITAL SIGNS: None   MULTI-SYSTEM PHYSICAL EXAMINATION:    Constitutional: Well-nourished. No physical deformities. Normally developed. Good grooming.  Respiratory: No labored breathing, no use of accessory muscles. Clear bilaterally.  Cardiovascular: Normal temperature, normal extremity pulses, no swelling, no varicosities. Regular rate and rhythm.  Gastrointestinal: No mass, no tenderness, no rigidity, non obese abdomen.      Complexity of Data:  Lab Test Review:   PSA  Records Review:   Pathology Reports, Previous Patient Records  X-Ray Review: PET- PSMA Scan: Reviewed Films.     01/06/23  PSA  Total PSA 2.98 ng/mL    PROCEDURES: None   ASSESSMENT:      ICD-10 Details  1 GU:   Prostate Cancer - C61    PLAN:           Document Letter(s):  Created for Patient: Clinical Summary         Notes:   1. High risk prostate cancer: After further discussion today, Gilbert Thompson does adamantly wish to proceed with surgical therapy for his prostate cancer. He would like to avoid androgen deprivation therapy and does feel confident with this decision assuming that his cardiac risk would be acceptably low. He does have follow-up later this month with Dr. Jens Som.   We discussed surgical therapy for prostate cancer including the different available surgical approaches. We discussed, in detail, the risks and expectations of surgery with regard to cancer control, urinary control, and erectile function as well as the expected postoperative recovery process. Additional risks of  surgery including but not limited to bleeding, infection, hernia formation, nerve damage, lymphocele formation, bowel/rectal injury potentially necessitating colostomy, damage to the urinary tract resulting in urine leakage, urethral stricture, and the cardiopulmonary risks such as myocardial infarction, stroke, death, venothromboembolism, etc. were explained. The risk of open surgical conversion for robotic/laparoscopic prostatectomy was also discussed.   He confirmed his decision today after meeting with myself and Dr. Kathrynn Running. He will be tentatively scheduled for a unilateral left nerve sparing robot-assisted laparoscopic radical prostatectomy and bilateral pelvic lymphadenectomy. He is scheduled for preoperative physical therapy tomorrow. We will proceed with obtaining cardiac clearance/risk assessment from Dr. Jens Som.   CC: Dr. Mosetta Putt  Dr. Margaretmary Dys  Dr. Olga Millers        Next Appointment:      Next Appointment: 03/31/2023 10:00 AM    Appointment Type: 60 Minute PT Pre-Op    Location: [facility] [phone]    Provider: Jeanice Lim      E & M CODES: We spent 45 minutes dedicated to evaluation and management time, including face to face interaction, discussions on coordination of care, documentation, result review, and discussion with others as applicable.     *  Signed by Heloise Purpura, M.D. on 03/30/23 at 7:54 PM (EDT)*

## 2023-04-29 ENCOUNTER — Encounter (HOSPITAL_COMMUNITY): Payer: Self-pay | Admitting: Urology

## 2023-04-29 ENCOUNTER — Other Ambulatory Visit: Payer: Self-pay

## 2023-04-29 ENCOUNTER — Encounter (HOSPITAL_COMMUNITY)
Admission: RE | Disposition: A | Discharge status: Home / Self Care | Payer: Self-pay | Source: Ambulatory Visit | Attending: Urology

## 2023-04-29 ENCOUNTER — Ambulatory Visit (HOSPITAL_COMMUNITY): Payer: Medicare HMO | Admitting: Physician Assistant

## 2023-04-29 ENCOUNTER — Ambulatory Visit (HOSPITAL_BASED_OUTPATIENT_CLINIC_OR_DEPARTMENT_OTHER): Payer: Medicare HMO | Admitting: Registered Nurse

## 2023-04-29 ENCOUNTER — Observation Stay (HOSPITAL_COMMUNITY)
Admission: RE | Admit: 2023-04-29 | Discharge: 2023-04-30 | Disposition: A | Discharge status: Home / Self Care | Payer: Medicare HMO | Source: Ambulatory Visit | Attending: Urology | Admitting: Urology

## 2023-04-29 DIAGNOSIS — I2510 Atherosclerotic heart disease of native coronary artery without angina pectoris: Secondary | ICD-10-CM | POA: Insufficient documentation

## 2023-04-29 DIAGNOSIS — Z7982 Long term (current) use of aspirin: Secondary | ICD-10-CM | POA: Diagnosis not present

## 2023-04-29 DIAGNOSIS — F1010 Alcohol abuse, uncomplicated: Secondary | ICD-10-CM | POA: Diagnosis not present

## 2023-04-29 DIAGNOSIS — C61 Malignant neoplasm of prostate: Principal | ICD-10-CM | POA: Diagnosis present

## 2023-04-29 DIAGNOSIS — J45909 Unspecified asthma, uncomplicated: Secondary | ICD-10-CM | POA: Insufficient documentation

## 2023-04-29 DIAGNOSIS — Z79899 Other long term (current) drug therapy: Secondary | ICD-10-CM | POA: Diagnosis not present

## 2023-04-29 DIAGNOSIS — Z01818 Encounter for other preprocedural examination: Secondary | ICD-10-CM

## 2023-04-29 DIAGNOSIS — I10 Essential (primary) hypertension: Secondary | ICD-10-CM | POA: Insufficient documentation

## 2023-04-29 HISTORY — PX: ROBOT ASSISTED LAPAROSCOPIC RADICAL PROSTATECTOMY: SHX5141

## 2023-04-29 HISTORY — PX: LYMPHADENECTOMY: SHX5960

## 2023-04-29 LAB — HEMOGLOBIN AND HEMATOCRIT, BLOOD
HCT: 43.4 % (ref 39.0–52.0)
Hemoglobin: 14.6 g/dL (ref 13.0–17.0)

## 2023-04-29 SURGERY — XI ROBOTIC ASSISTED LAPAROSCOPIC RADICAL PROSTATECTOMY LEVEL 2
Anesthesia: General

## 2023-04-29 MED ORDER — EZETIMIBE 10 MG PO TABS
10.0000 mg | ORAL_TABLET | Freq: Every day | ORAL | Status: DC
  Administered 2023-04-29 – 2023-04-30 (×2): 10 mg via ORAL
  Filled 2023-04-29 (×2): qty 1

## 2023-04-29 MED ORDER — UMECLIDINIUM BROMIDE 62.5 MCG/ACT IN AEPB
1.0000 | INHALATION_SPRAY | Freq: Every day | RESPIRATORY_TRACT | Status: DC
  Administered 2023-04-30: 1 via RESPIRATORY_TRACT
  Filled 2023-04-29: qty 7

## 2023-04-29 MED ORDER — BUPIVACAINE-EPINEPHRINE 0.25% -1:200000 IJ SOLN
INTRAMUSCULAR | Status: DC | PRN
  Administered 2023-04-29: 27 mL

## 2023-04-29 MED ORDER — ZOLPIDEM TARTRATE 5 MG PO TABS: 5.0000 mg | ORAL_TABLET | Freq: Every evening | ORAL | Status: DC | PRN

## 2023-04-29 MED ORDER — CHLORHEXIDINE GLUCONATE 0.12 % MT SOLN
15.0000 mL | Freq: Once | OROMUCOSAL | Status: AC
  Administered 2023-04-29: 15 mL via OROMUCOSAL

## 2023-04-29 MED ORDER — ROSUVASTATIN CALCIUM 20 MG PO TABS
40.0000 mg | ORAL_TABLET | Freq: Every day | ORAL | Status: DC
  Administered 2023-04-30: 40 mg via ORAL
  Filled 2023-04-29: qty 2

## 2023-04-29 MED ORDER — MORPHINE SULFATE (PF) 2 MG/ML IV SOLN
INTRAVENOUS | Status: AC
  Filled 2023-04-29: qty 1

## 2023-04-29 MED ORDER — KETAMINE HCL 50 MG/5ML IJ SOSY
PREFILLED_SYRINGE | INTRAMUSCULAR | Status: AC
  Filled 2023-04-29: qty 5

## 2023-04-29 MED ORDER — ACETAMINOPHEN 325 MG PO TABS: 650.0000 mg | ORAL_TABLET | ORAL | Status: DC | PRN

## 2023-04-29 MED ORDER — ACETAMINOPHEN 500 MG PO TABS
1000.0000 mg | ORAL_TABLET | Freq: Once | ORAL | Status: AC
  Administered 2023-04-29: 1000 mg via ORAL
  Filled 2023-04-29: qty 2

## 2023-04-29 MED ORDER — KETOROLAC TROMETHAMINE 15 MG/ML IJ SOLN
15.0000 mg | Freq: Four times a day (QID) | INTRAMUSCULAR | Status: DC
  Administered 2023-04-29 – 2023-04-30 (×4): 15 mg via INTRAVENOUS
  Filled 2023-04-29 (×3): qty 1

## 2023-04-29 MED ORDER — ONDANSETRON HCL 4 MG/2ML IJ SOLN
INTRAMUSCULAR | Status: DC | PRN
  Administered 2023-04-29: 4 mg via INTRAVENOUS

## 2023-04-29 MED ORDER — LACTATED RINGERS IV SOLN: INTRAVENOUS | Status: DC

## 2023-04-29 MED ORDER — DEXAMETHASONE SODIUM PHOSPHATE 10 MG/ML IJ SOLN
INTRAMUSCULAR | Status: DC | PRN
  Administered 2023-04-29: 8 mg via INTRAVENOUS

## 2023-04-29 MED ORDER — NITROGLYCERIN 0.4 MG SL SUBL: 0.4000 mg | SUBLINGUAL_TABLET | SUBLINGUAL | Status: DC | PRN

## 2023-04-29 MED ORDER — DOCUSATE SODIUM 100 MG PO CAPS
100.0000 mg | ORAL_CAPSULE | Freq: Two times a day (BID) | ORAL | Status: DC
  Administered 2023-04-29 – 2023-04-30 (×2): 100 mg via ORAL
  Filled 2023-04-29 (×2): qty 1

## 2023-04-29 MED ORDER — SULFAMETHOXAZOLE-TRIMETHOPRIM 800-160 MG PO TABS: 1.0000 | ORAL_TABLET | Freq: Two times a day (BID) | ORAL | 0 refills | Status: DC

## 2023-04-29 MED ORDER — EPHEDRINE SULFATE-NACL 50-0.9 MG/10ML-% IV SOSY
PREFILLED_SYRINGE | INTRAVENOUS | Status: DC | PRN
  Administered 2023-04-29: 5 mg via INTRAVENOUS

## 2023-04-29 MED ORDER — DOCUSATE SODIUM 100 MG PO CAPS: 100.0000 mg | ORAL_CAPSULE | Freq: Two times a day (BID) | ORAL | Status: DC

## 2023-04-29 MED ORDER — FENTANYL CITRATE (PF) 100 MCG/2ML IJ SOLN
INTRAMUSCULAR | Status: DC | PRN
  Administered 2023-04-29: 100 ug via INTRAVENOUS
  Administered 2023-04-29 (×2): 50 ug via INTRAVENOUS

## 2023-04-29 MED ORDER — METOPROLOL SUCCINATE ER 25 MG PO TB24
25.0000 mg | ORAL_TABLET | Freq: Every day | ORAL | Status: DC
  Administered 2023-04-30: 25 mg via ORAL
  Filled 2023-04-29: qty 1

## 2023-04-29 MED ORDER — BUPIVACAINE-EPINEPHRINE 0.25% -1:200000 IJ SOLN
INTRAMUSCULAR | Status: AC
  Filled 2023-04-29: qty 1

## 2023-04-29 MED ORDER — CEFAZOLIN SODIUM-DEXTROSE 2-4 GM/100ML-% IV SOLN
2.0000 g | INTRAVENOUS | Status: AC
  Administered 2023-04-29: 2 g via INTRAVENOUS
  Filled 2023-04-29: qty 100

## 2023-04-29 MED ORDER — LOSARTAN POTASSIUM 50 MG PO TABS
50.0000 mg | ORAL_TABLET | Freq: Every day | ORAL | Status: DC
  Administered 2023-04-29 – 2023-04-30 (×2): 50 mg via ORAL
  Filled 2023-04-29 (×2): qty 1

## 2023-04-29 MED ORDER — FENTANYL CITRATE (PF) 100 MCG/2ML IJ SOLN
INTRAMUSCULAR | Status: AC
  Filled 2023-04-29: qty 2

## 2023-04-29 MED ORDER — ORAL CARE MOUTH RINSE: 15.0000 mL | Freq: Once | OROMUCOSAL | Status: AC

## 2023-04-29 MED ORDER — FINASTERIDE 5 MG PO TABS
5.0000 mg | ORAL_TABLET | Freq: Every day | ORAL | Status: DC
  Administered 2023-04-29 – 2023-04-30 (×2): 5 mg via ORAL
  Filled 2023-04-29 (×2): qty 1

## 2023-04-29 MED ORDER — EPHEDRINE 5 MG/ML INJ
INTRAVENOUS | Status: AC
  Filled 2023-04-29: qty 5

## 2023-04-29 MED ORDER — ONDANSETRON HCL 4 MG/2ML IJ SOLN: 4.0000 mg | INTRAMUSCULAR | Status: DC | PRN

## 2023-04-29 MED ORDER — ASPIRIN 81 MG PO TBEC
81.0000 mg | DELAYED_RELEASE_TABLET | Freq: Every day | ORAL | Status: DC
  Administered 2023-04-30: 81 mg via ORAL
  Filled 2023-04-29: qty 1

## 2023-04-29 MED ORDER — HYDROMORPHONE HCL 1 MG/ML IJ SOLN
0.2500 mg | INTRAMUSCULAR | Status: DC | PRN
  Administered 2023-04-29 (×3): 0.5 mg via INTRAVENOUS

## 2023-04-29 MED ORDER — KETOROLAC TROMETHAMINE 15 MG/ML IJ SOLN
INTRAMUSCULAR | Status: AC
  Filled 2023-04-29: qty 1

## 2023-04-29 MED ORDER — PROPOFOL 10 MG/ML IV BOLUS
INTRAVENOUS | Status: DC | PRN
Start: 2023-04-29 — End: 2023-04-29
  Administered 2023-04-29: 170 mg via INTRAVENOUS

## 2023-04-29 MED ORDER — FLUTICASONE FUROATE-VILANTEROL 100-25 MCG/ACT IN AEPB
1.0000 | INHALATION_SPRAY | Freq: Every day | RESPIRATORY_TRACT | Status: DC
  Administered 2023-04-30: 1 via RESPIRATORY_TRACT
  Filled 2023-04-29: qty 28

## 2023-04-29 MED ORDER — ACETAMINOPHEN 10 MG/ML IV SOLN: 1000.0000 mg | Freq: Once | INTRAVENOUS | Status: DC | PRN

## 2023-04-29 MED ORDER — ALBUTEROL SULFATE (2.5 MG/3ML) 0.083% IN NEBU: 2.5000 mg | INHALATION_SOLUTION | Freq: Four times a day (QID) | RESPIRATORY_TRACT | Status: DC | PRN

## 2023-04-29 MED ORDER — SODIUM CHLORIDE 0.9 % IR SOLN
Status: DC | PRN
  Administered 2023-04-29: 1000 mL

## 2023-04-29 MED ORDER — KETAMINE HCL 10 MG/ML IJ SOLN
INTRAMUSCULAR | Status: DC | PRN
  Administered 2023-04-29 (×2): 20 mg via INTRAVENOUS

## 2023-04-29 MED ORDER — HYDROMORPHONE HCL 1 MG/ML IJ SOLN
INTRAMUSCULAR | Status: AC
  Administered 2023-04-29: 0.5 mg via INTRAVENOUS
  Filled 2023-04-29: qty 2

## 2023-04-29 MED ORDER — TRIPLE ANTIBIOTIC 3.5-400-5000 EX OINT: 1.0000 | TOPICAL_OINTMENT | Freq: Three times a day (TID) | CUTANEOUS | Status: DC | PRN

## 2023-04-29 MED ORDER — ALBUTEROL SULFATE HFA 108 (90 BASE) MCG/ACT IN AERS: 1.0000 | INHALATION_SPRAY | Freq: Four times a day (QID) | RESPIRATORY_TRACT | Status: DC | PRN

## 2023-04-29 MED ORDER — MORPHINE SULFATE (PF) 2 MG/ML IV SOLN
2.0000 mg | INTRAVENOUS | Status: DC | PRN
  Administered 2023-04-29 – 2023-04-30 (×4): 2 mg via INTRAVENOUS
  Filled 2023-04-29 (×4): qty 1

## 2023-04-29 MED ORDER — LIDOCAINE HCL (PF) 2 % IJ SOLN
INTRAMUSCULAR | Status: AC
  Filled 2023-04-29: qty 5

## 2023-04-29 MED ORDER — CEFAZOLIN SODIUM-DEXTROSE 1-4 GM/50ML-% IV SOLN
1.0000 g | Freq: Three times a day (TID) | INTRAVENOUS | Status: AC
  Administered 2023-04-29 – 2023-04-30 (×2): 1 g via INTRAVENOUS
  Filled 2023-04-29 (×2): qty 50

## 2023-04-29 MED ORDER — DEXAMETHASONE SODIUM PHOSPHATE 10 MG/ML IJ SOLN
INTRAMUSCULAR | Status: AC
  Filled 2023-04-29: qty 1

## 2023-04-29 MED ORDER — STERILE WATER FOR IRRIGATION IR SOLN
Status: DC | PRN
  Administered 2023-04-29: 1000 mL

## 2023-04-29 MED ORDER — PROPOFOL 10 MG/ML IV BOLUS
INTRAVENOUS | Status: AC
  Filled 2023-04-29: qty 20

## 2023-04-29 MED ORDER — HEPARIN SODIUM (PORCINE) 1000 UNIT/ML IJ SOLN
INTRAMUSCULAR | Status: AC
  Filled 2023-04-29: qty 1

## 2023-04-29 MED ORDER — PANTOPRAZOLE SODIUM 40 MG PO TBEC
40.0000 mg | DELAYED_RELEASE_TABLET | Freq: Two times a day (BID) | ORAL | Status: DC
  Administered 2023-04-29 – 2023-04-30 (×2): 40 mg via ORAL
  Filled 2023-04-29 (×2): qty 1

## 2023-04-29 MED ORDER — ALLOPURINOL 300 MG PO TABS
300.0000 mg | ORAL_TABLET | Freq: Every day | ORAL | Status: DC
  Administered 2023-04-30: 300 mg via ORAL
  Filled 2023-04-29: qty 1

## 2023-04-29 MED ORDER — MAGNESIUM CITRATE PO SOLN: 1.0000 | Freq: Once | ORAL | Status: DC

## 2023-04-29 MED ORDER — DIPHENHYDRAMINE HCL 50 MG/ML IJ SOLN: 12.5000 mg | Freq: Four times a day (QID) | INTRAMUSCULAR | Status: DC | PRN

## 2023-04-29 MED ORDER — AMISULPRIDE (ANTIEMETIC) 5 MG/2ML IV SOLN: 10.0000 mg | Freq: Once | INTRAVENOUS | Status: DC | PRN

## 2023-04-29 MED ORDER — MIDAZOLAM HCL 5 MG/5ML IJ SOLN
INTRAMUSCULAR | Status: DC | PRN
  Administered 2023-04-29: 2 mg via INTRAVENOUS

## 2023-04-29 MED ORDER — LIDOCAINE 2% (20 MG/ML) 5 ML SYRINGE
INTRAMUSCULAR | Status: DC | PRN
  Administered 2023-04-29: 100 mg via INTRAVENOUS

## 2023-04-29 MED ORDER — DIPHENHYDRAMINE HCL 12.5 MG/5ML PO ELIX: 12.5000 mg | ORAL_SOLUTION | Freq: Four times a day (QID) | ORAL | Status: DC | PRN

## 2023-04-29 MED ORDER — FLEET ENEMA 7-19 GM/118ML RE ENEM: 1.0000 | ENEMA | Freq: Once | RECTAL | Status: DC

## 2023-04-29 MED ORDER — SUGAMMADEX SODIUM 200 MG/2ML IV SOLN
INTRAVENOUS | Status: DC | PRN
  Administered 2023-04-29: 200 mg via INTRAVENOUS

## 2023-04-29 MED ORDER — LACTATED RINGERS IV SOLN: INTRAVENOUS | Status: DC | PRN

## 2023-04-29 MED ORDER — SODIUM CHLORIDE 0.9 % IV BOLUS
1000.0000 mL | Freq: Once | INTRAVENOUS | Status: AC
  Administered 2023-04-29: 1000 mL via INTRAVENOUS

## 2023-04-29 MED ORDER — ROCURONIUM BROMIDE 10 MG/ML (PF) SYRINGE
PREFILLED_SYRINGE | INTRAVENOUS | Status: DC | PRN
  Administered 2023-04-29: 20 mg via INTRAVENOUS
  Administered 2023-04-29: 60 mg via INTRAVENOUS

## 2023-04-29 MED ORDER — MIDAZOLAM HCL 2 MG/2ML IJ SOLN
INTRAMUSCULAR | Status: AC
  Filled 2023-04-29: qty 2

## 2023-04-29 MED ORDER — ROCURONIUM BROMIDE 10 MG/ML (PF) SYRINGE
PREFILLED_SYRINGE | INTRAVENOUS | Status: AC
  Filled 2023-04-29: qty 10

## 2023-04-29 MED ORDER — KCL IN DEXTROSE-NACL 20-5-0.45 MEQ/L-%-% IV SOLN
INTRAVENOUS | Status: DC
  Filled 2023-04-29 (×2): qty 1000

## 2023-04-29 MED ORDER — ALPRAZOLAM 0.5 MG PO TABS
1.0000 mg | ORAL_TABLET | Freq: Every evening | ORAL | Status: DC | PRN
  Administered 2023-04-29: 1 mg via ORAL
  Filled 2023-04-29: qty 2

## 2023-04-29 MED ORDER — ONDANSETRON HCL 4 MG/2ML IJ SOLN
INTRAMUSCULAR | Status: AC
  Filled 2023-04-29: qty 2

## 2023-04-29 SURGICAL SUPPLY — 65 items
ADH SKN CLS APL DERMABOND .7 (GAUZE/BANDAGES/DRESSINGS) ×2
APL PRP STRL LF DISP 70% ISPRP (MISCELLANEOUS) ×2
APL SWBSTK 6 STRL LF DISP (MISCELLANEOUS) ×2
APPLICATOR COTTON TIP 6 STRL (MISCELLANEOUS) ×2 IMPLANT
APPLICATOR COTTON TIP 6IN STRL (MISCELLANEOUS) ×2
BAG COUNTER SPONGE SURGICOUNT (BAG) IMPLANT
BAG SPNG CNTER NS LX DISP (BAG)
CATH FOLEY 2WAY SLVR 18FR 30CC (CATHETERS) ×2 IMPLANT
CATH ROBINSON RED A/P 16FR (CATHETERS) ×2 IMPLANT
CATH ROBINSON RED A/P 8FR (CATHETERS) ×2 IMPLANT
CATH TIEMANN FOLEY 18FR 5CC (CATHETERS) ×2 IMPLANT
CHLORAPREP W/TINT 26 (MISCELLANEOUS) ×2 IMPLANT
CLIP LIGATING HEM O LOK PURPLE (MISCELLANEOUS) ×2 IMPLANT
COVER SURGICAL LIGHT HANDLE (MISCELLANEOUS) ×2 IMPLANT
COVER TIP SHEARS 8 DVNC (MISCELLANEOUS) ×2 IMPLANT
CUTTER ECHEON FLEX ENDO 45 340 (ENDOMECHANICALS) ×2 IMPLANT
DERMABOND ADVANCED .7 DNX12 (GAUZE/BANDAGES/DRESSINGS) ×2 IMPLANT
DRAIN CHANNEL RND F F (WOUND CARE) IMPLANT
DRAPE ARM DVNC X/XI (DISPOSABLE) ×8 IMPLANT
DRAPE COLUMN DVNC XI (DISPOSABLE) ×2 IMPLANT
DRAPE SURG IRRIG POUCH 19X23 (DRAPES) ×2 IMPLANT
DRIVER NDL LRG 8 DVNC XI (INSTRUMENTS) ×4 IMPLANT
DRIVER NDLE LRG 8 DVNC XI (INSTRUMENTS) ×4
DRSG TEGADERM 4X4.75 (GAUZE/BANDAGES/DRESSINGS) ×2 IMPLANT
ELECT PENCIL ROCKER SW 15FT (MISCELLANEOUS) ×2 IMPLANT
ELECT REM PT RETURN 15FT ADLT (MISCELLANEOUS) ×2 IMPLANT
FORCEPS BPLR LNG DVNC XI (INSTRUMENTS) ×2 IMPLANT
FORCEPS PROGRASP DVNC XI (FORCEP) ×2 IMPLANT
GAUZE SPONGE 4X4 12PLY STRL (GAUZE/BANDAGES/DRESSINGS) ×2 IMPLANT
GLOVE BIO SURGEON STRL SZ 6.5 (GLOVE) ×2 IMPLANT
GLOVE SURG LX STRL 7.5 STRW (GLOVE) ×4 IMPLANT
GOWN STRL REUS W/ TWL XL LVL3 (GOWN DISPOSABLE) ×4 IMPLANT
GOWN STRL REUS W/TWL XL LVL3 (GOWN DISPOSABLE) ×4
GOWN STRL SURGICAL XL XLNG (GOWN DISPOSABLE) ×2 IMPLANT
HOLDER FOLEY CATH W/STRAP (MISCELLANEOUS) ×2 IMPLANT
IRRIG SUCT STRYKERFLOW 2 WTIP (MISCELLANEOUS) ×2
IRRIGATION SUCT STRKRFLW 2 WTP (MISCELLANEOUS) ×2 IMPLANT
IV LACTATED RINGERS 1000ML (IV SOLUTION) ×2 IMPLANT
KIT TURNOVER KIT A (KITS) IMPLANT
NDL SAFETY ECLIP 18X1.5 (MISCELLANEOUS) ×2 IMPLANT
PACK ROBOT UROLOGY CUSTOM (CUSTOM PROCEDURE TRAY) ×2 IMPLANT
PLUG CATH AND CAP STRL 200 (CATHETERS) ×2 IMPLANT
RELOAD STAPLE 45 4.1 GRN THCK (STAPLE) ×2 IMPLANT
SCISSORS MNPLR CVD DVNC XI (INSTRUMENTS) ×2 IMPLANT
SEAL UNIV 5-12 XI (MISCELLANEOUS) ×8 IMPLANT
SET CYSTO W/LG BORE CLAMP LF (SET/KITS/TRAYS/PACK) IMPLANT
SET TUBE SMOKE EVAC HIGH FLOW (TUBING) ×2 IMPLANT
SOL ELECTROSURG ANTI STICK (MISCELLANEOUS) ×2
SOL PREP POV-IOD 4OZ 10% (MISCELLANEOUS) ×2 IMPLANT
SOLUTION ELECTROSURG ANTI STCK (MISCELLANEOUS) ×2 IMPLANT
SPIKE FLUID TRANSFER (MISCELLANEOUS) ×2 IMPLANT
STAPLE RELOAD 45 GRN (STAPLE) ×2
SUT ETHILON 3 0 PS 1 (SUTURE) ×2 IMPLANT
SUT MNCRL 3 0 RB1 (SUTURE) ×2 IMPLANT
SUT MNCRL 3 0 VIOLET RB1 (SUTURE) ×2 IMPLANT
SUT MNCRL AB 4-0 PS2 18 (SUTURE) ×4 IMPLANT
SUT PDS PLUS AB 0 CT-2 (SUTURE) ×4 IMPLANT
SUT VIC AB 0 CT1 27 (SUTURE) ×4
SUT VIC AB 0 CT1 27XBRD ANTBC (SUTURE) ×4 IMPLANT
SUT VIC AB 2-0 SH 27 (SUTURE) ×2
SUT VIC AB 2-0 SH 27X BRD (SUTURE) ×2 IMPLANT
SYR 27GX1/2 1ML LL SAFETY (SYRINGE) ×2 IMPLANT
TOWEL OR NON WOVEN STRL DISP B (DISPOSABLE) ×2 IMPLANT
TROCAR Z THREAD OPTICAL 12X100 (TROCAR) IMPLANT
WATER STERILE IRR 1000ML POUR (IV SOLUTION) ×2 IMPLANT

## 2023-04-29 NOTE — Anesthesia Postprocedure Evaluation (Signed)
Anesthesia Post Note  Patient: BREYDEN DOMER  Procedure(s) Performed: XI ROBOTIC ASSISTED LAPAROSCOPIC RADICAL PROSTATECTOMY LEVEL 2 BILATERAL PELVIC LYMPHADENECTOMY (Bilateral)     Patient location during evaluation: PACU Anesthesia Type: General Level of consciousness: awake and alert Pain management: pain level controlled Vital Signs Assessment: post-procedure vital signs reviewed and stable Respiratory status: spontaneous breathing, nonlabored ventilation and respiratory function stable Cardiovascular status: blood pressure returned to baseline Postop Assessment: no apparent nausea or vomiting Anesthetic complications: no   No notable events documented.  Last Vitals:  Vitals:   04/29/23 1200 04/29/23 1300  BP: (!) 146/86 (!) 141/78  Pulse: 75 76  Resp: 13 11  Temp:    SpO2: 98% 98%    Last Pain:  Vitals:   04/29/23 1200  TempSrc:   PainSc: 5                  Shanda Howells

## 2023-04-29 NOTE — Anesthesia Procedure Notes (Signed)
Procedure Name: Intubation Date/Time: 04/29/2023 7:23 AM  Performed by: Elisabeth Cara, CRNAPre-anesthesia Checklist: Patient identified, Emergency Drugs available, Suction available, Patient being monitored and Timeout performed Patient Re-evaluated:Patient Re-evaluated prior to induction Oxygen Delivery Method: Circle system utilized Preoxygenation: Pre-oxygenation with 100% oxygen Induction Type: IV induction Ventilation: Mask ventilation without difficulty Laryngoscope Size: Mac and 4 Grade View: Grade I Tube type: Oral Tube size: 7.5 mm Number of attempts: 1 Airway Equipment and Method: Stylet Placement Confirmation: ETT inserted through vocal cords under direct vision, positive ETCO2 and breath sounds checked- equal and bilateral Secured at: 23 cm Tube secured with: Tape Dental Injury: Teeth and Oropharynx as per pre-operative assessment

## 2023-04-29 NOTE — Progress Notes (Signed)
Patient ID: Gilbert Thompson, male   DOB: 06/09/1951, 72 y.o.   MRN: [id number]  Post-op note  Subjective: The patient is doing well.  No complaints.  Objective: Vital signs in last 24 hours: Temp:  [97.6 F (36.4 C)-98.5 F (36.9 C)] 97.6 F (36.4 C) (08/08 1028) Pulse Rate:  [64-76] 76 (08/08 1300) Resp:  [11-18] 11 (08/08 1300) BP: (129-146)/(63-86) 141/78 (08/08 1300) SpO2:  [95 %-100 %] 98 % (08/08 1300) Weight:  [87.5 kg] 87.5 kg (08/08 0545)  Intake/Output from previous day: No intake/output data recorded. Intake/Output this shift: Total I/O In: 2117 [I.V.:2017; IV Piggyback:100] Out: 75 [Blood:75]  Physical Exam:  General: Alert and oriented. Abdomen: Soft, Nondistended. Incisions: Clean and dry. GU: Urine clearing.  Lab Results: Recent Labs    04/29/23 1149  HGB 14.6  HCT 43.4    Assessment/Plan: POD#0   1) Continue to monitor, ambulate, IS   Gilbert Thompson. MD   LOS: 0 days   Gilbert Thompson 04/29/2023, 1:51 PM

## 2023-04-29 NOTE — Discharge Instructions (Signed)

## 2023-04-29 NOTE — Transfer of Care (Signed)
Immediate Anesthesia Transfer of Care Note  Patient: Gilbert Thompson  Procedure(s) Performed: XI ROBOTIC ASSISTED LAPAROSCOPIC RADICAL PROSTATECTOMY LEVEL 2 BILATERAL PELVIC LYMPHADENECTOMY (Bilateral)  Patient Location: PACU  Anesthesia Type:General  Level of Consciousness: awake, alert , oriented, and patient cooperative  Airway & Oxygen Therapy: Patient Spontanous Breathing and Patient connected to face mask oxygen  Post-op Assessment: Report given to RN, Post -op Vital signs reviewed and stable, and Patient moving all extremities  Post vital signs: Reviewed and stable  Last Vitals:  Vitals Value Taken Time  BP 129/69 04/29/23 1028  Temp    Pulse 76 04/29/23 1030  Resp 14 04/29/23 1030  SpO2 100 % 04/29/23 1030  Vitals shown include unfiled device data.  Last Pain:  Vitals:   04/29/23 0618  TempSrc: Oral  PainSc:          Complications: No notable events documented.

## 2023-04-29 NOTE — Op Note (Signed)
Preoperative diagnosis: Clinically localized adenocarcinoma of the prostate (clinical stage T2a N0 M0)  Postoperative diagnosis: Clinically localized adenocarcinoma of the prostate (clinical stage T2a N0 M0)  Procedure:  Robotic assisted laparoscopic radical prostatectomy (left nerve sparing) Bilateral robotic assisted laparoscopic pelvic lymphadenectomy  Surgeon: Moody Bruins. M.D.  Assistant(s): Harrie Foreman, PA-C  An assistant was required for this surgical procedure.  The duties of the assistant included but were not limited to suctioning, passing suture, camera manipulation, retraction. This procedure would not be able to be performed without an Geophysicist/field seismologist.   Resident: Dr. Jerald Kief  Anesthesia: General  Complications: None  EBL: 80 mL  IVF:  1700 mL crystalloid  Specimens: Prostate and seminal vesicles Right pelvic lymph nodes Left pelvic lymph nodes  Disposition of specimens: Pathology  Drains: 20 Fr coude catheter # 19 Blake pelvic drain  Indication: Gilbert Thompson is a 72 y.o. patient with clinically localized prostate cancer.  After a thorough review of the management options for treatment of prostate cancer, he elected to proceed with surgical therapy and the above procedure(s).  We have discussed the potential benefits and risks of the procedure, side effects of the proposed treatment, the likelihood of the patient achieving the goals of the procedure, and any potential problems that might occur during the procedure or recuperation. Informed consent has been obtained.  Description of procedure:  The patient was taken to the operating room and a general anesthetic was administered. He was given preoperative antibiotics, placed in the dorsal lithotomy position, and prepped and draped in the usual sterile fashion. Next a preoperative timeout was performed. A urethral catheter was placed into the bladder and a site was selected near the umbilicus  for placement of the camera port. This was placed using a standard open Hassan technique which allowed entry into the peritoneal cavity under direct vision and without difficulty. An 8 mm port was placed and a pneumoperitoneum established. The camera was then used to inspect the abdomen and there was no evidence of any intra-abdominal injuries or other abnormalities. The remaining abdominal ports were then placed. 8 mm robotic ports were placed in the right lower quadrant, left lower quadrant, and far left lateral abdominal wall. A 5 mm port was placed in the right upper quadrant and a 12 mm port was placed in the right lateral abdominal wall for laparoscopic assistance. All ports were placed under direct vision without difficulty. The surgical cart was then docked.   Utilizing the cautery scissors, the bladder was reflected posteriorly allowing entry into the space of Retzius and identification of the endopelvic fascia and prostate. The periprostatic fat was then removed from the prostate allowing full exposure of the endopelvic fascia. The endopelvic fascia was then incised from the apex back to the base of the prostate bilaterally and the underlying levator muscle fibers were swept laterally off the prostate thereby isolating the dorsal venous complex. The dorsal vein was then stapled and divided with a 45 mm Flex Echelon stapler. Attention then turned to the bladder neck which was divided anteriorly thereby allowing entry into the bladder and exposure of the urethral catheter. The catheter balloon was deflated and the catheter was brought into the operative field and used to retract the prostate anteriorly. The posterior bladder neck was then examined and was divided allowing further dissection between the bladder and prostate posteriorly until the vasa deferentia and seminal vessels were identified. The vasa deferentia were isolated, divided, and lifted anteriorly. The seminal vesicles were  dissected down to  their tips with care to control the seminal vascular arterial blood supply. These structures were then lifted anteriorly and the space between Denonvillier's fascia and the anterior rectum was developed with a combination of sharp and blunt dissection. This isolated the vascular pedicles of the prostate.  The lateral prostatic fascia on the left side of the prostate was then sharply incised allowing release of the neurovascular bundle. The vascular pedicle of the prostate on the left side was then ligated with Weck clips between the prostate and neurovascular bundle and divided with sharp cold scissor dissection resulting in neurovascular bundle preservation. On the right side, a wide non nerve sparing dissection was performed with Weck clips used to ligate the vascular pedicle of the prostate. The neurovascular bundle on the left side was then separated off the apex of the prostate and urethra.   The urethra was then sharply transected allowing the prostate specimen to be disarticulated. The pelvis was copiously irrigated and hemostasis was ensured. There was no evidence for rectal injury.  Attention then turned to the right pelvic sidewall. The fibrofatty tissue between the external iliac vein, confluence of the iliac vessels, hypogastric artery, and Cooper's ligament was dissected free from the pelvic sidewall with care to preserve the obturator nerve. Weck clips were used for lymphostasis and hemostasis. An identical procedure was performed on the contralateral side and the lymphatic packets were removed for permanent pathologic analysis.  Attention then turned to the urethral anastomosis. A 2-0 Vicryl slip knot was placed between Denonvillier's fascia, the posterior bladder neck, and the posterior urethra to reapproximate these structures. A double-armed 3-0 Monocryl suture was then used to perform a 360 running tension-free anastomosis between the bladder neck and urethra. A new urethral catheter was  then placed into the bladder and irrigated. There were no blood clots within the bladder and the anastomosis appeared to be watertight. A #19 Blake drain was then brought through the left lateral 8 mm port site and positioned appropriately within the pelvis. It was secured to the skin with a nylon suture. The surgical cart was then undocked. The right lateral 12 mm port site was closed at the fascial level with a 0 Vicryl suture placed laparoscopically. All remaining ports were then removed under direct vision. The prostate specimen was removed intact within the Endopouch retrieval bag via the periumbilical camera port site. This fascial opening was closed with two running 0 PDS sutures. 0.25% Marcaine was then injected into all port sites and all incisions were reapproximated at the skin level with 4-0 Monocryl subcuticular sutures and Dermabond. The patient appeared to tolerate the procedure well and without complications. The patient was able to be extubated and transferred to the recovery unit in satisfactory condition.   Moody Bruins MD

## 2023-04-29 NOTE — Interval H&P Note (Signed)
History and Physical Interval Note:  04/29/2023 6:49 AM  Gilbert Thompson  has presented today for surgery, with the diagnosis of PROSTATE CANCER.  The various methods of treatment have been discussed with the patient and family. After consideration of risks, benefits and other options for treatment, the patient has consented to  Procedure(s) with comments: XI ROBOTIC ASSISTED LAPAROSCOPIC RADICAL PROSTATECTOMY LEVEL 2 (N/A) - 210 MINUTES NEEDED FOR CASE BILATERAL PELVIC LYMPHADENECTOMY (Bilateral) as a surgical intervention.  The patient's history has been reviewed, patient examined, no change in status, stable for surgery.  I have reviewed the patient's chart and labs.  Questions were answered to the patient's satisfaction.     [employer]

## 2023-04-30 ENCOUNTER — Encounter (HOSPITAL_COMMUNITY): Payer: Self-pay | Admitting: Urology

## 2023-04-30 DIAGNOSIS — C61 Malignant neoplasm of prostate: Secondary | ICD-10-CM | POA: Diagnosis not present

## 2023-04-30 LAB — HEMOGLOBIN AND HEMATOCRIT, BLOOD
HCT: 35.7 % — ABNORMAL LOW (ref 39.0–52.0)
Hemoglobin: 12.2 g/dL — ABNORMAL LOW (ref 13.0–17.0)

## 2023-04-30 MED ORDER — CHLORHEXIDINE GLUCONATE CLOTH 2 % EX PADS
6.0000 | MEDICATED_PAD | Freq: Every day | CUTANEOUS | Status: DC
  Administered 2023-04-30: 6 via TOPICAL

## 2023-04-30 MED ORDER — HYDROCODONE-ACETAMINOPHEN 5-325 MG PO TABS: 1.0000 | ORAL_TABLET | ORAL | Status: DC | PRN

## 2023-04-30 MED ORDER — BISACODYL 10 MG RE SUPP
10.0000 mg | Freq: Once | RECTAL | Status: AC
  Administered 2023-04-30: 10 mg via RECTAL
  Filled 2023-04-30: qty 1

## 2023-04-30 NOTE — Plan of Care (Signed)
  Problem: Education: Goal: Knowledge of the procedure and recovery process will improve Outcome: Completed/Met   Problem: Pain Management: Goal: General experience of comfort will improve Outcome: Completed/Met   Problem: Urinary Elimination: Goal: Ability to avoid or minimize complications of infection will improve Outcome: Completed/Met Goal: Ability to achieve and maintain urine output will improve Outcome: Completed/Met Goal: Home care management will improve Outcome: Completed/Met   Problem: Education: Goal: Knowledge of General Education information will improve Description: Including pain rating scale, medication(s)/side effects and non-pharmacologic comfort measures Outcome: Completed/Met   Problem: Activity: Goal: Risk for activity intolerance will decrease Outcome: Completed/Met

## 2023-04-30 NOTE — Plan of Care (Signed)
  Problem: Education: Goal: Knowledge of the procedure and recovery process will improve Outcome: Progressing   Problem: Pain Management: Goal: General experience of comfort will improve Outcome: Progressing   Problem: Skin Integrity: Goal: Demonstration of wound healing without infection will improve Outcome: Progressing

## 2023-04-30 NOTE — Progress Notes (Signed)
Transition of Care [facility]) - Inpatient Brief Assessment   Patient Details  Name: Gilbert Thompson MRN: [id number] Date of Birth: [birth year]  Transition of Care [facility]) CM/SW Contact:    Larrie Kass, LCSW Phone Number: 04/30/2023, 12:16 PM       Transition of Care Asessment: Insurance and Status: Insurance coverage has been reviewed Patient has primary care physician: Yes Home environment has been reviewed: yes   Prior/Current Home Services: No current home services Social Determinants of Health Reivew: SDOH reviewed no interventions necessary Readmission risk has been reviewed: Yes Transition of care needs: no transition of care needs at this time

## 2023-04-30 NOTE — Progress Notes (Signed)
During the evening shift patient heart rate noted between 32 - 38 bpm. No c/o cheat pain or shortness of breath. Patient also ambulated in the hallway several times and this a.m. Patient denies  pain at incision site feels that pain is controlled.

## 2023-04-30 NOTE — Care Management Obs Status (Signed)
MEDICARE OBSERVATION STATUS NOTIFICATION   Patient Details  Name: Gilbert Thompson MRN: [id number] Date of Birth: [birth year]   Medicare Observation Status Notification Given:  Yes    Larrie Kass, LCSW 04/30/2023, 12:02 PM

## 2023-04-30 NOTE — Progress Notes (Signed)
Reviewed all discharge instructions with patient and family including medications, follow up appointments, catheter teaching (foley to leg bag training, how to empty catheter bags, catheter care), & incision care. Patient and family verbalized understanding of teaching and able to demonstrate catheter care. Answered all questions for patient and family. , Yancey Flemings, RN

## 2023-04-30 NOTE — Discharge Summary (Signed)
Date of admission: 04/29/2023  Date of discharge: 04/30/2023  Admission diagnosis: prostate cancer  Discharge diagnosis: same  Secondary diagnoses:  Patient Active Problem List   Diagnosis Date Noted   Prostate cancer (HCC) 04/29/2023   Genetic testing 04/23/2023   Malignant neoplasm of prostate (HCC) 02/24/2023   Coronary artery disease involving native coronary artery of native heart without angina pectoris    DOE (dyspnea on exertion)     Procedures performed: Procedure(s): XI ROBOTIC ASSISTED LAPAROSCOPIC RADICAL PROSTATECTOMY LEVEL 2 BILATERAL PELVIC LYMPHADENECTOMY  History and Physical: For full details, please see admission history and physical. Briefly, Gilbert Thompson is a 72 y.o. year old patient with 4+4 prostate cancer who underwent radical prostatectomy on 8/8.   Hospital Course: Patient tolerated the procedure well.  He was then transferred to the floor after an uneventful PACU stay.  His hospital course was uncomplicated.  On POD#1 he had met discharge criteria: was eating a regular diet, was up and ambulating independently,  pain was well controlled, was voiding without a catheter, and was ready to for discharge.   Laboratory values:  Recent Labs    04/29/23 1149 04/30/23 0411 04/30/23 1104  HGB 14.6 11.4* 12.2*  HCT 43.4 33.1* 35.7*   No results for input(s): "NA", "K", "CL", "CO2", "GLUCOSE", "BUN", "CREATININE", "CALCIUM" in the last 72 hours. No results for input(s): "LABPT", "INR" in the last 72 hours. No results for input(s): "LABURIN" in the last 72 hours. No results found for this or any previous visit.  Physical Exam  Gen: NAD Resp: Satting well on RA Card: Regular rate Abd: Soft, appropriately tender, ND, incision clean dry and intact GU:  Foley catheter in place draining urine. Neuro: Alert   Disposition: Home  Discharge instruction: The patient was instructed to be ambulatory but told to refrain from heavy lifting, strenuous activity, or  driving.  Discharge medications:  Allergies as of 04/30/2023   No Known Allergies      Medication List     STOP taking these medications    aspirin EC 81 MG tablet   cholecalciferol 25 MCG (1000 UNIT) tablet Commonly known as: VITAMIN D3   clopidogrel 75 MG tablet Commonly known as: Plavix   ibuprofen 200 MG tablet Commonly known as: ADVIL       TAKE these medications    albuterol 108 (90 Base) MCG/ACT inhaler Commonly known as: VENTOLIN HFA Inhale 1-2 puffs into the lungs every 6 (six) hours as needed for wheezing or shortness of breath.   allopurinol 300 MG tablet Commonly known as: ZYLOPRIM Take 300 mg by mouth daily.   ALPRAZolam 1 MG tablet Commonly known as: XANAX Take 1 mg by mouth at bedtime as needed for anxiety or sleep.   docusate sodium 100 MG capsule Commonly known as: COLACE Take 1 capsule (100 mg total) by mouth 2 (two) times daily.   ezetimibe 10 MG tablet Commonly known as: ZETIA Take 1 tablet (10 mg total) by mouth daily.   finasteride 5 MG tablet Commonly known as: PROSCAR 1.25 mg daily.   HYDROcodone-acetaminophen 5-325 MG tablet Commonly known as: NORCO/VICODIN Take 1 tablet by mouth every 6 (six) hours as needed for severe pain.   losartan 50 MG tablet Commonly known as: COZAAR Take 1 tablet (50 mg total) by mouth daily.   metoprolol succinate 25 MG 24 hr tablet Commonly known as: Toprol XL Take 1 tablet (25 mg total) by mouth daily.   nitroGLYCERIN 0.4 MG SL tablet Commonly known  as: NITROSTAT Place 1 tablet (0.4 mg total) under the tongue every 5 (five) minutes as needed for chest pain.   pantoprazole 40 MG tablet Commonly known as: Protonix Take 1 tablet (40 mg total) by mouth 2 (two) times daily.   REDNESS RELIEVER EYE DROPS OP Place 1 drop into both eyes daily as needed (redness).   rosuvastatin 40 MG tablet Commonly known as: CRESTOR Take 1 tablet (40 mg total) by mouth daily.   sulfamethoxazole-trimethoprim  800-160 MG tablet Commonly known as: BACTRIM DS Take 1 tablet by mouth 2 (two) times daily. Start the day prior to foley removal appointment   Trelegy Ellipta 100-62.5-25 MCG/ACT Aepb Generic drug: Fluticasone-Umeclidin-Vilant Inhale 1 puff into the lungs daily.        Followup:   Follow-up Information     Heloise Purpura, MD Follow up on 05/07/2023.   Specialty: Urology Why: 1245pm Contact information: [address] [place] [address] [phone]

## 2023-04-30 NOTE — Progress Notes (Signed)
Patient ID: Gilbert Thompson, male   DOB: 12-23-1950, 72 y.o.   MRN: [id number]  1 Day Post-Op Subjective: The patient is doing well.  No nausea or vomiting. Pain is adequately controlled.  Objective: Vital signs in last 24 hours: Temp:  [97.6 F (36.4 C)-98.6 F (37 C)] 98.1 F (36.7 C) (08/09 0345) Pulse Rate:  [71-96] 96 (08/09 0345) Resp:  [11-18] 17 (08/09 0345) BP: (129-170)/(63-86) 135/68 (08/09 0345) SpO2:  [95 %-100 %] 99 % (08/09 0345)  Intake/Output from previous day: 08/08 0701 - 08/09 0700 In: 3528.7 [I.V.:3328.7; IV Piggyback:200] Out: 2445 [Urine:2300; Drains:70; Blood:75] Intake/Output this shift: No intake/output data recorded.  Physical Exam:  General: Alert and oriented. CV: RRR Lungs: Clear bilaterally. GI: Soft, Nondistended. Incisions: Clean, dry, and intact Urine: Clear Extremities: Nontender, no erythema, no edema.  Lab Results: Recent Labs    04/29/23 1149 04/30/23 0411  HGB 14.6 11.4*  HCT 43.4 33.1*      Assessment/Plan: POD# 1 s/p robotic prostatectomy.  1) SL IVF 2) Ambulate, Incentive spirometry 3) Transition to oral pain medication 4) Dulcolax suppository 5) D/C pelvic drain 6) Plan for likely discharge later today   Moody Bruins. MD   LOS: 0 days   Gilbert Thompson 04/30/2023, 8:02 AM

## 2023-05-06 NOTE — Progress Notes (Signed)
RN left voicemail for call back to assess any questions or concerns since recent robotic prostatectomy.    Patient does have post op appointment on 8/16.

## 2023-05-25 ENCOUNTER — Other Ambulatory Visit: Payer: Self-pay

## 2023-05-25 MED ORDER — METOPROLOL SUCCINATE ER 25 MG PO TB24: 25.0000 mg | ORAL_TABLET | Freq: Every day | ORAL | 3 refills | Status: DC

## 2023-05-31 DIAGNOSIS — N393 Stress incontinence (female) (male): Secondary | ICD-10-CM | POA: Diagnosis not present

## 2023-05-31 DIAGNOSIS — M62838 Other muscle spasm: Secondary | ICD-10-CM | POA: Diagnosis not present

## 2023-05-31 DIAGNOSIS — M6281 Muscle weakness (generalized): Secondary | ICD-10-CM | POA: Diagnosis not present

## 2023-06-07 ENCOUNTER — Other Ambulatory Visit: Payer: Self-pay

## 2023-06-07 MED ORDER — METOPROLOL SUCCINATE ER 25 MG PO TB24: 25.0000 mg | ORAL_TABLET | Freq: Every day | ORAL | 3 refills | Status: DC

## 2023-06-15 DIAGNOSIS — M6281 Muscle weakness (generalized): Secondary | ICD-10-CM | POA: Diagnosis not present

## 2023-06-15 DIAGNOSIS — N393 Stress incontinence (female) (male): Secondary | ICD-10-CM | POA: Diagnosis not present

## 2023-06-15 DIAGNOSIS — M62838 Other muscle spasm: Secondary | ICD-10-CM | POA: Diagnosis not present

## 2023-07-05 DIAGNOSIS — N393 Stress incontinence (female) (male): Secondary | ICD-10-CM | POA: Diagnosis not present

## 2023-07-05 DIAGNOSIS — M62838 Other muscle spasm: Secondary | ICD-10-CM | POA: Diagnosis not present

## 2023-07-05 DIAGNOSIS — M6281 Muscle weakness (generalized): Secondary | ICD-10-CM | POA: Diagnosis not present

## 2023-07-08 DIAGNOSIS — C61 Malignant neoplasm of prostate: Secondary | ICD-10-CM

## 2023-07-08 NOTE — Progress Notes (Signed)
RN left message for call back.  Patient was presented to [facility] on 7/9, proceed with surgery on 8/8.

## 2023-07-20 ENCOUNTER — Encounter: Payer: Self-pay | Admitting: *Deleted

## 2023-07-23 ENCOUNTER — Encounter: Payer: Self-pay | Admitting: *Deleted

## 2023-07-26 ENCOUNTER — Encounter: Payer: Self-pay | Admitting: *Deleted

## 2023-08-09 NOTE — Progress Notes (Signed)
Patient is now 3 months out from his radical prostatectomy.  Recently has post operative PSA on 11/11 and results are undetectable.  Remains under active care with urology and has upcoming survivorship appointment on 11/22.

## 2023-08-13 ENCOUNTER — Encounter: Payer: Self-pay | Admitting: *Deleted

## 2023-08-13 ENCOUNTER — Inpatient Hospital Stay: Payer: Medicare HMO | Attending: Adult Health | Admitting: *Deleted

## 2023-08-13 DIAGNOSIS — C61 Malignant neoplasm of prostate: Secondary | ICD-10-CM

## 2023-08-13 NOTE — Progress Notes (Signed)
  SCP reviewed and completed. Post-treatment PSA was undetectable. Last colonoscopy was 12/06/2017.Next colonoscopy 11/2027.

## 2023-08-24 ENCOUNTER — Ambulatory Visit: Payer: Self-pay | Admitting: General Surgery

## 2023-08-24 DIAGNOSIS — K432 Incisional hernia without obstruction or gangrene: Secondary | ICD-10-CM | POA: Diagnosis not present

## 2023-08-24 NOTE — H&P (Signed)
Chief Complaint: New Consultation     History of Present Illness: Gilbert Thompson is a 72 y.o. male who is seen today as an office consultation at the request of Dr. Duaine Dredge for evaluation of New Consultation .    Patient is a 72 year old male with a history of prostate cancer status post robotic assisted radical prostatectomy in August 2024.  Patient states that thereafter he had a coughing episode and felt a bulge to the midline incision site.  This is just above the umbilicus.  He states that since that time he has felt the hernia gotten somewhat larger.  He has had no significant pain.  He had no signs or symptoms of incarceration or strangulation.  He had no previous abdominal surgery.  Patient currently is on Plavix and was cleared for surgery in July of last year for his prostatectomy.     Review of Systems: A complete review of systems was obtained from the patient.  I have reviewed this information and discussed as appropriate with the patient.  See HPI as well for other ROS.  Review of Systems  Constitutional:  Negative for fever.  HENT:  Negative for congestion.   Eyes:  Negative for blurred vision.  Respiratory:  Negative for cough, shortness of breath and wheezing.   Cardiovascular:  Negative for chest pain and palpitations.  Gastrointestinal:  Negative for heartburn.  Genitourinary:  Negative for dysuria.  Musculoskeletal:  Negative for myalgias.  Skin:  Negative for rash.  Neurological:  Negative for dizziness and headaches.  Psychiatric/Behavioral:  Negative for depression and suicidal ideas.   All other systems reviewed and are negative.     Medical History: Past Medical History: Diagnosis Date  Asthma, unspecified asthma severity, unspecified whether complicated, unspecified whether persistent (HHS-HCC)   GERD (gastroesophageal reflux disease)   History of cancer    There is no problem list on file for this patient.   Past Surgical  History: Procedure Laterality Date  PROSTATE SURGERY      No Known Allergies  Current Outpatient Medications on File Prior to Visit Medication Sig Dispense Refill  allopurinoL (ZYLOPRIM) 300 MG tablet Take 300 mg by mouth once daily    ALPRAZolam (XANAX) 1 MG tablet Take 1 mg by mouth    cholecalciferol (VITAMIN D3) 1000 unit tablet Take 1,000 Units by mouth once daily    clopidogreL (PLAVIX) 75 mg tablet Take 75 mg by mouth once daily    losartan (COZAAR) 50 MG tablet Take 50 mg by mouth once daily    metoprolol SUCCinate (TOPROL-XL) 25 MG XL tablet Take 25 mg by mouth once daily    nitroGLYcerin (NITROSTAT) 0.4 MG SL tablet Place 0.4 mg under the tongue every 5 (five) minutes as needed    omeprazole (PRILOSEC) 20 MG DR capsule Take 20 mg by mouth once daily    No current facility-administered medications on file prior to visit.   Family History Problem Relation Age of Onset  Skin cancer Father   High blood pressure (Hypertension) Father   Hyperlipidemia (Elevated cholesterol) Father   Coronary Artery Disease (Blocked arteries around heart) Father     Social History  Tobacco Use Smoking Status Never Smokeless Tobacco Never    Social History  Socioeconomic History  Marital status: Married Tobacco Use  Smoking status: Never  Smokeless tobacco: Never Substance and Sexual Activity  Alcohol use: Yes  Drug use: Never  Social Drivers of Health  Food Insecurity: No Food Insecurity (04/29/2023)  Received from [facility]  Health  Hunger Vital Sign   Worried About Running Out of Food in the Last Year: Never true   Ran Out of Food in the Last Year: Never true Transportation Needs: No Transportation Needs (04/29/2023)  [facility] from [facility] - Transportation   Lack of Transportation (Medical): No   Lack of Transportation (Non-Medical): No   Objective:   Vitals:  08/24/23 0915 08/24/23 0916 BP: 137/83  Pulse: 87  Temp: 36.3 C (97.3 F)  SpO2: 97%  Weight: 85  kg (187 lb 6.4 oz)  Height: 175.3 cm (5\' 9" )  PainSc:  1  PainLoc:  Abdomen   Body mass index is 27.67 kg/m.  Physical Exam Constitutional:      General: He is not in acute distress.    Appearance: Normal appearance.  HENT:     Head: Normocephalic.     Nose: No rhinorrhea.     Mouth/Throat:     Mouth: Mucous membranes are moist.     Pharynx: Oropharynx is clear.  Eyes:     General: No scleral icterus.    Pupils: Pupils are equal, round, and reactive to light.  Cardiovascular:     Rate and Rhythm: Normal rate.     Pulses: Normal pulses.  Pulmonary:     Effort: Pulmonary effort is normal. No respiratory distress.     Breath sounds: No stridor. No wheezing.  Abdominal:     General: Abdomen is flat. There is no distension.     Tenderness: There is no abdominal tenderness. There is no guarding or rebound.     Hernia: A hernia is present.        Comments: 4-5cm hernia   Musculoskeletal:        General: Normal range of motion.     Cervical back: Normal range of motion and neck supple.  Skin:    General: Skin is warm and dry.     Capillary Refill: Capillary refill takes less than 2 seconds.     Coloration: Skin is not jaundiced.  Neurological:     General: No focal deficit present.     Mental Status: He is alert and oriented to person, place, and time. Mental status is at baseline.  Psychiatric:        Mood and Affect: Mood normal.        Thought Content: Thought content normal.        Judgment: Judgment normal.      Hernia Size: 4-5 cm Incarcerated: No Initial Hernia   Assessment and Plan: Diagnoses and all orders for this visit:  Incisional hernia without obstruction or gangrene    Gilbert Thompson is a 72 y.o. male   1.  We will proceed to the OR for a lap ventral hernia repair with mesh. 2. All risks and benefits were discussed with the patient, to generally include infection, bleeding, damage to surrounding structures, acute and chronic nerve pain, and  recurrence. Alternatives were offered and described.  All questions were answered and the patient voiced understanding of the procedure and wishes to proceed at this point.       No follow-ups on file.  Axel Filler, MD, [facility], [facility]

## 2023-09-13 ENCOUNTER — Telehealth: Payer: Self-pay | Admitting: Cardiology

## 2023-09-13 NOTE — Telephone Encounter (Signed)
I sent surgical clearance note from Dr.Crenshaw to Dr. Vevelyn Royals office via Epic.

## 2023-09-13 NOTE — Telephone Encounter (Signed)
Follow Up:      Patient is calling to check on the status of his clearance. He said his surgeon told him to call. He said the surgeon said he had not received the clearance.

## 2023-09-14 ENCOUNTER — Telehealth: Payer: Self-pay

## 2023-09-14 NOTE — Telephone Encounter (Signed)
1st attempt to reach pt to schedule tele visit. Lvm  

## 2023-09-14 NOTE — Telephone Encounter (Signed)
   Name: Gilbert Thompson  DOB: 1951-04-28  MRN: [id number]  Primary Cardiologist: Olga Millers, MD   Preoperative team, please contact this patient and set up a phone call appointment for further preoperative risk assessment. Please obtain consent and complete medication review. Thank you for your help.  I confirm that guidance regarding antiplatelet and oral anticoagulation therapy has been completed and, if necessary, noted below.  Per office protocol, if patient is without any new symptoms or concerns at the time of their virtual visit, he/she may hold Plavix for 5-7 days prior to procedure. Please resume Plavix as soon as possible postprocedure, at the discretion of the surgeon.    I also confirmed the patient resides in the state of [place]. As per [facility] Medical Board telemedicine laws, the patient must reside in the state in which the provider is licensed.   Denyce Robert, NP 09/14/2023, 9:06 AM [facility]

## 2023-09-14 NOTE — Telephone Encounter (Signed)
   Pre-operative Risk Assessment    Patient Name: Gilbert Thompson  DOB: 07-Nov-1950 MRN: [id number]     Request for Surgical Clearance    Procedure:  Hernia Surgery   Date of Surgery:  Clearance TBD                               Surgeon:  Dr. Axel Filler  Surgeon's Group or Practice Name:  [facility]  Phone number:  [phone] Fax number:  [phone]   Type of Clearance Requested:   - Pharmacy:  Hold Clopidogrel (Plavix)     Type of Anesthesia:  General    Additional requests/questions:    Scarlette Shorts   09/14/2023, 8:10 AM

## 2023-09-14 NOTE — Telephone Encounter (Signed)
Patient scheduled for tele visit on 09/16/23. Med rec and consent done. Pt states that he needed to get clearance asap due to him going out of the country and when he returns they are planning to have the surgery

## 2023-09-14 NOTE — Telephone Encounter (Signed)
  Patient Consent for Virtual Visit         Gilbert Thompson has provided verbal consent on 09/14/2023 for a virtual visit (video or telephone).   CONSENT FOR VIRTUAL VISIT FOR:  Gilbert Thompson  By participating in this virtual visit I agree to the following:  I hereby voluntarily request, consent and authorize [facility] and its employed or contracted physicians, physician assistants, nurse practitioners or other licensed health care professionals (the Practitioner), to provide me with telemedicine health care services (the "Services") as deemed necessary by the treating Practitioner. I acknowledge and consent to receive the Services by the Practitioner via telemedicine. I understand that the telemedicine visit will involve communicating with the Practitioner through live audiovisual communication technology and the disclosure of certain medical information by electronic transmission. I acknowledge that I have been given the opportunity to request an in-person assessment or other available alternative prior to the telemedicine visit and am voluntarily participating in the telemedicine visit.  I understand that I have the right to withhold or withdraw my consent to the use of telemedicine in the course of my care at any time, without affecting my right to future care or treatment, and that the Practitioner or I may terminate the telemedicine visit at any time. I understand that I have the right to inspect all information obtained and/or recorded in the course of the telemedicine visit and may receive copies of available information for a reasonable fee.  I understand that some of the potential risks of receiving the Services via telemedicine include:  Delay or interruption in medical evaluation due to technological equipment failure or disruption; Information transmitted may not be sufficient (e.g. poor resolution of images) to allow for appropriate medical decision making by the  Practitioner; and/or  In rare instances, security protocols could fail, causing a breach of personal health information.  Furthermore, I acknowledge that it is my responsibility to provide information about my medical history, conditions and care that is complete and accurate to the best of my ability. I acknowledge that Practitioner's advice, recommendations, and/or decision may be based on factors not within their control, such as incomplete or inaccurate data provided by me or distortions of diagnostic images or specimens that may result from electronic transmissions. I understand that the practice of medicine is not an exact science and that Practitioner makes no warranties or guarantees regarding treatment outcomes. I acknowledge that a copy of this consent can be made available to me via my patient portal [facility] MyChart), or I can request a printed copy by calling the office of [facility].    I understand that my insurance will be billed for this visit.   I have read or had this consent read to me. I understand the contents of this consent, which adequately explains the benefits and risks of the Services being provided via telemedicine.  I have been provided ample opportunity to ask questions regarding this consent and the Services and have had my questions answered to my satisfaction. I give my informed consent for the services to be provided through the use of telemedicine in my medical care

## 2023-09-16 ENCOUNTER — Ambulatory Visit: Payer: Medicare HMO | Attending: Cardiology | Admitting: Cardiology

## 2023-09-16 DIAGNOSIS — Z01810 Encounter for preprocedural cardiovascular examination: Secondary | ICD-10-CM

## 2023-09-16 NOTE — Progress Notes (Signed)
Virtual Visit via Telephone Note   Because of Gilbert Thompson's co-morbid illnesses, he is at least at moderate risk for complications without adequate follow up.  This format is felt to be most appropriate for this patient at this time.  The patient did not have access to video technology/had technical difficulties with video requiring transitioning to audio format only (telephone).  All issues noted in this document were discussed and addressed.  No physical exam could be performed with this format.  Please refer to the patient's chart for his consent to telehealth for [facility].  Evaluation Performed:  Preoperative cardiovascular risk assessment _____________   Date:  09/16/2023   Patient ID:  Gilbert Thompson, DOB 1951-05-13, MRN [id number] Patient Location:  Home Provider location:   Office  Primary Care Provider:  Mosetta Putt, MD Primary Cardiologist:  Olga Millers, MD  Chief Complaint / Patient Profile   72 y.o. y/o male with a h/o CAD, hypertension, hyperlipidemia who is pending Hernia surgery with Dr. Derrell Lolling, date TBD and presents today for telephonic preoperative cardiovascular risk assessment.  History of Present Illness    Gilbert Thompson is a 72 y.o. male who presents via audio/video conferencing for a telehealth visit today.  Pt was last seen in cardiology clinic on 04/14/23 by Dr. Jens Som.  At that time Gilbert Thompson was doing well.  The patient is now pending procedure as outlined above. Since his last visit, he has remained stable from a cardiac perspective.   Today he denies chest pain, shortness of breath, lower extremity edema, fatigue, palpitations, melena, hematuria, hemoptysis, diaphoresis, weakness, presyncope, syncope, orthopnea, and PND.   Past Medical History    Past Medical History:  Diagnosis Date   Asthma    Cancer (HCC)    prostate cancer   Dyspnea    with exertion   GERD (gastroesophageal reflux disease)    Hyperlipidemia     Hypertension    Pneumonia    Past Surgical History:  Procedure Laterality Date   LEFT HEART CATH AND CORONARY ANGIOGRAPHY N/A 06/05/2022   Procedure: LEFT HEART CATH AND CORONARY ANGIOGRAPHY;  Surgeon: Lyn Records, MD;  Location: [facility];  Service: Cardiovascular;  Laterality: N/A;   LYMPHADENECTOMY Bilateral 04/29/2023   Procedure: BILATERAL PELVIC LYMPHADENECTOMY;  Surgeon: Heloise Purpura, MD;  Location: [facility];  Service: Urology;  Laterality: Bilateral;   PROSTATE BIOPSY     ROBOT ASSISTED LAPAROSCOPIC RADICAL PROSTATECTOMY N/A 04/29/2023   Procedure: XI ROBOTIC ASSISTED LAPAROSCOPIC RADICAL PROSTATECTOMY LEVEL 2;  Surgeon: Heloise Purpura, MD;  Location: [facility];  Service: Urology;  Laterality: N/A;  210 MINUTES NEEDED FOR CASE   UPPER GASTROINTESTINAL ENDOSCOPY     Allergies No Known Allergies  Home Medications    Prior to Admission medications   Medication Sig Start Date End Date Taking? Authorizing Provider  albuterol (VENTOLIN HFA) 108 (90 Base) MCG/ACT inhaler Inhale 1-2 puffs into the lungs every 6 (six) hours as needed for wheezing or shortness of breath. 03/03/22   [provider]  allopurinol (ZYLOPRIM) 300 MG tablet Take 300 mg by mouth daily. 03/03/22   [provider]  ALPRAZolam Prudy Feeler) 1 MG tablet Take 1 mg by mouth at bedtime as needed for anxiety or sleep. 03/05/22   [provider]  cholecalciferol (VITAMIN D3) 25 MCG (1000 UNIT) tablet Take 1,000 Units by mouth daily.    [provider]  clopidogrel (PLAVIX) 75 MG tablet Take 75 mg by mouth daily.  05/26/23   [provider]  docusate sodium (COLACE) 100 MG capsule Take 1 capsule (100 mg total) by mouth 2 (two) times daily. 04/29/23   Harrie Foreman, PA-C  ezetimibe (ZETIA) 10 MG tablet Take 1 tablet (10 mg total) by mouth daily. 03/19/23   Jodelle Gross, NP  finasteride (PROSCAR) 5 MG tablet 1.25 mg daily. 03/03/22   [provider]   HYDROcodone-acetaminophen (NORCO/VICODIN) 5-325 MG tablet Take 1 tablet by mouth every 6 (six) hours as needed for severe pain. 01/28/22   [provider]  losartan (COZAAR) 50 MG tablet Take 1 tablet (50 mg total) by mouth daily. 03/19/23   Jodelle Gross, NP  metoprolol succinate (TOPROL XL) 25 MG 24 hr tablet Take 1 tablet (25 mg total) by mouth daily. 06/07/23 06/06/24  Lewayne Bunting, MD  nitroGLYCERIN (NITROSTAT) 0.4 MG SL tablet Place 1 tablet (0.4 mg total) under the tongue every 5 (five) minutes as needed for chest pain. Patient not taking: Reported on 08/13/2023 05/15/22 04/16/23  Joylene Grapes, NP  pantoprazole (PROTONIX) 40 MG tablet Take 1 tablet (40 mg total) by mouth 2 (two) times daily. 03/19/23   Jodelle Gross, NP  rosuvastatin (CRESTOR) 40 MG tablet Take 1 tablet (40 mg total) by mouth daily. 03/19/23   Jodelle Gross, NP  Tetrahydrozoline HCl (REDNESS RELIEVER EYE DROPS OP) Place 1 drop into both eyes daily as needed (redness).    [provider]  TRELEGY ELLIPTA 100-62.5-25 MCG/ACT AEPB Inhale 1 puff into the lungs daily. 04/04/22   [provider]    Physical Exam    Vital Signs:  PINCHES ZARRELLA does not have vital signs available for review today.  Given telephonic nature of communication, physical exam is limited. AAOx3. NAD. Normal affect.  Speech and respirations are unlabored.  Accessory Clinical Findings   None Assessment & Plan    1.  Preoperative Cardiovascular Risk Assessment: Hernia surgery with Dr. Derrell Lolling, date TBD. Mr. Pask perioperative risk of a major cardiac event is 0.9% according to the Revised Cardiac Risk Index (RCRI).  Therefore, he is at low risk for perioperative complications.   His functional capacity is excellent at 8.33 METs according to the Duke Activity Status Index (DASI). Recommendations: According to ACC/AHA guidelines, no further cardiovascular testing needed.  The patient may proceed to  surgery at acceptable risk.   Antiplatelet and/or Anticoagulation Recommendations: Per office protocol, he may hold Plavix for 5 days prior to procedure. Please resume Plavix as soon as possible postprocedure, at the discretion of the surgeon.    The patient was advised that if he develops new symptoms prior to surgery to contact our office to arrange for a follow-up visit, and he verbalized understanding.  A copy of this note will be routed to requesting surgeon.  Time:   Today, I have spent 7 minutes with the patient with telehealth technology discussing medical history, symptoms, and management plan.    Rip Harbour, NP  09/16/2023, 11:00 AM

## 2023-09-30 NOTE — Progress Notes (Signed)
 Surgical Instructions   Your procedure is scheduled on Tuesday, 10/05/23. Report to [facility] Main Entrance A at 5:30 A.M., then check in with the Admitting office. Any questions or running late day of surgery: call [phone]  Questions prior to your surgery date: call [phone], Monday-Friday, 8am-4pm. If you experience any cold or flu symptoms such as cough, fever, chills, shortness of breath, etc. between now and your scheduled surgery, please notify us  at the above number.     Remember:  Do not eat after midnight the night before your surgery   You may drink clear liquids until 4:30am the morning of your surgery.   Clear liquids allowed are: Water , Non-Citrus Juices (without pulp), Carbonated Beverages, Clear Tea (no milk, honey, etc.), Black Coffee Only (NO MILK, CREAM OR POWDERED CREAMER of any kind), and Gatorade.    Take these medicines the morning of surgery with A SIP OF WATER   allopurinol  (ZYLOPRIM )  ezetimibe  (ZETIA )  finasteride  (PROSCAR )  metoprolol  succinate (TOPROL  XL)  pantoprazole  (PROTONIX )  rosuvastatin  (CRESTOR )  TRELEGY ELLIPTA   May take these medicines IF NEEDED: albuterol  (VENTOLIN  HFA) 108 (90 Base) MCG/ACT inhaler  ALPRAZolam  (XANAX )  HYDROcodone -acetaminophen  (NORCO/VICODIN)  metaxalone (SKELAXIN)  Tetrahydrozoline HCl (REDNESS RELIEVER EYE DROPS OP)   Please bring all inhalers with you the day of surgery.    One week prior to surgery, STOP taking any Aspirin  (unless otherwise instructed by your surgeon) Aleve, Naproxen, Ibuprofen, Motrin, Advil, Goody's, BC's, all herbal medications, fish oil, and non-prescription vitamins.  Please do not take clopidogrel  (PLAVIX ) 7 days prior to surgery. Your last dose will be 09/27/23.                     Do NOT Smoke (Tobacco/Vaping) for 24 hours prior to your procedure.  If you use a CPAP at night, you may bring your mask/headgear for your overnight stay.   You will be asked to remove any contacts,  glasses, piercing's, hearing aid's, dentures/partials prior to surgery. Please bring cases for these items if needed.    Patients discharged the day of surgery will not be allowed to drive home, and someone needs to stay with them for 24 hours.  SURGICAL WAITING ROOM VISITATION Patients may have no more than 2 support people in the waiting area - these visitors may rotate.   Pre-op nurse will coordinate an appropriate time for 1 ADULT support person, who may not rotate, to accompany patient in pre-op.  Children under the age of 22 must have an adult with them who is not the patient and must remain in the main waiting area with an adult.  If the patient needs to stay at the hospital during part of their recovery, the visitor guidelines for inpatient rooms apply.  Please refer to the [facility] website for the visitor guidelines for any additional information.   If you received a COVID test during your pre-op visit  it is requested that you wear a mask when out in public, stay away from anyone that may not be feeling well and notify your surgeon if you develop symptoms. If you have been in contact with anyone that has tested positive in the last 10 days please notify you surgeon.      Pre-operative CHG Bathing Instructions   You can play a key role in reducing the risk of infection after surgery. Your skin needs to be as free of germs as possible. You can reduce the number of germs on your skin  by washing with CHG (chlorhexidine  gluconate) soap before surgery. CHG is an antiseptic soap that kills germs and continues to kill germs even after washing.   DO NOT use if you have an allergy to chlorhexidine /CHG or antibacterial soaps. If your skin becomes reddened or irritated, stop using the CHG and notify one of our RNs at [phone].              TAKE A SHOWER THE NIGHT BEFORE SURGERY AND THE DAY OF SURGERY    Please keep in mind the following:  DO NOT shave, including legs and underarms,  48 hours prior to surgery.   You may shave your face before/day of surgery.  Place clean sheets on your bed the night before surgery Use a clean washcloth (not used since being washed) for each shower. DO NOT sleep with pet's night before surgery.  CHG Shower Instructions:  Wash your face and private area with normal soap. If you choose to wash your hair, wash first with your normal shampoo.  After you use shampoo/soap, rinse your hair and body thoroughly to remove shampoo/soap residue.  Turn the water  OFF and apply half the bottle of CHG soap to a CLEAN washcloth.  Apply CHG soap ONLY FROM YOUR NECK DOWN TO YOUR TOES (washing for 3-5 minutes)  DO NOT use CHG soap on face, private areas, open wounds, or sores.  Pay special attention to the area where your surgery is being performed.  If you are having back surgery, having someone wash your back for you may be helpful. Wait 2 minutes after CHG soap is applied, then you may rinse off the CHG soap.  Pat dry with a clean towel  Put on clean pajamas    Additional instructions for the day of surgery: DO NOT APPLY any lotions, deodorants, cologne, or perfumes.   Do not wear jewelry or makeup Do not wear nail polish, gel polish, artificial nails, or any other type of covering on natural nails (fingers and toes) Do not bring valuables to the hospital. [facility] is not responsible for valuables/personal belongings. Put on clean/comfortable clothes.  Please brush your teeth.  Ask your nurse before applying any prescription medications to the skin.

## 2023-10-01 ENCOUNTER — Other Ambulatory Visit: Payer: Self-pay

## 2023-10-01 ENCOUNTER — Encounter (HOSPITAL_COMMUNITY): Payer: Self-pay

## 2023-10-01 ENCOUNTER — Encounter (HOSPITAL_COMMUNITY)
Admission: RE | Admit: 2023-10-01 | Discharge: 2023-10-01 | Disposition: A | Discharge status: Home / Self Care | Payer: Medicare HMO | Source: Ambulatory Visit | Attending: General Surgery | Admitting: General Surgery

## 2023-10-01 VITALS — BP 140/74 | HR 84 | Temp 98.2°F | Resp 18 | Ht 69.0 in | Wt 186.5 lb

## 2023-10-01 DIAGNOSIS — K432 Incisional hernia without obstruction or gangrene: Secondary | ICD-10-CM | POA: Insufficient documentation

## 2023-10-01 DIAGNOSIS — E785 Hyperlipidemia, unspecified: Secondary | ICD-10-CM | POA: Diagnosis not present

## 2023-10-01 DIAGNOSIS — Z01812 Encounter for preprocedural laboratory examination: Secondary | ICD-10-CM | POA: Insufficient documentation

## 2023-10-01 DIAGNOSIS — I2510 Atherosclerotic heart disease of native coronary artery without angina pectoris: Secondary | ICD-10-CM | POA: Insufficient documentation

## 2023-10-01 DIAGNOSIS — Z01818 Encounter for other preprocedural examination: Secondary | ICD-10-CM

## 2023-10-01 DIAGNOSIS — K219 Gastro-esophageal reflux disease without esophagitis: Secondary | ICD-10-CM | POA: Diagnosis not present

## 2023-10-01 DIAGNOSIS — Z789 Other specified health status: Secondary | ICD-10-CM

## 2023-10-01 DIAGNOSIS — I10 Essential (primary) hypertension: Secondary | ICD-10-CM | POA: Diagnosis not present

## 2023-10-01 DIAGNOSIS — J45909 Unspecified asthma, uncomplicated: Secondary | ICD-10-CM | POA: Diagnosis not present

## 2023-10-01 DIAGNOSIS — Z1152 Encounter for screening for COVID-19: Secondary | ICD-10-CM | POA: Insufficient documentation

## 2023-10-01 HISTORY — DX: Dysphonia: R49.0

## 2023-10-01 HISTORY — DX: Atherosclerotic heart disease of native coronary artery without angina pectoris: I25.10

## 2023-10-01 LAB — CBC
HCT: 39.9 % (ref 39.0–52.0)
Hemoglobin: 13.6 g/dL (ref 13.0–17.0)
MCH: 31.1 pg (ref 26.0–34.0)
MCHC: 34.1 g/dL (ref 30.0–36.0)
MCV: 91.3 fL (ref 80.0–100.0)
Platelets: 296 10*3/uL (ref 150–400)
RBC: 4.37 MIL/uL (ref 4.22–5.81)
RDW: 13.5 % (ref 11.5–15.5)
WBC: 11.7 10*3/uL — ABNORMAL HIGH (ref 4.0–10.5)
nRBC: 0 % (ref 0.0–0.2)

## 2023-10-01 LAB — COMPREHENSIVE METABOLIC PANEL
ALT: 16 U/L (ref 0–44)
AST: 25 U/L (ref 15–41)
Albumin: 3.3 g/dL — ABNORMAL LOW (ref 3.5–5.0)
Alkaline Phosphatase: 65 U/L (ref 38–126)
Anion gap: 13 (ref 5–15)
BUN: 10 mg/dL (ref 8–23)
CO2: 20 mmol/L — ABNORMAL LOW (ref 22–32)
Calcium: 9.4 mg/dL (ref 8.9–10.3)
Chloride: 103 mmol/L (ref 98–111)
Creatinine, Ser: 1.17 mg/dL (ref 0.61–1.24)
GFR, Estimated: >60 mL/min (ref >60)
Glucose, Bld: 85 mg/dL (ref 70–99)
Potassium: 4 mmol/L (ref 3.5–5.1)
Sodium: 136 mmol/L (ref 135–145)
Total Bilirubin: 0.9 mg/dL (ref 0.0–1.2)
Total Protein: 7 g/dL (ref 6.5–8.1)

## 2023-10-01 NOTE — Progress Notes (Signed)
 Surgical Instructions   Your procedure is scheduled on Tuesday, 10/05/23. Report to [facility] Main Entrance A at 5:30 A.M., then check in with the Admitting office. Any questions or running late day of surgery: call [phone]  Questions prior to your surgery date: call [phone], Monday-Friday, 8am-4pm. If you experience any cold or flu symptoms such as cough, fever, chills, shortness of breath, etc. between now and your scheduled surgery, please notify us  at the above number.     Remember:  Do not eat after midnight the night before your surgery   You may drink clear liquids until 4:30am the morning of your surgery.   Clear liquids allowed are: Water , Non-Citrus Juices (without pulp), Carbonated Beverages, Clear Tea (no milk, honey, etc.), Black Coffee Only (NO MILK, CREAM OR POWDERED CREAMER of any kind), and Gatorade.    Take these medicines the morning of surgery with A SIP OF WATER   allopurinol  (ZYLOPRIM )  ezetimibe  (ZETIA )  finasteride  (PROSCAR )  metoprolol  succinate (TOPROL  XL)  pantoprazole  (PROTONIX )  rosuvastatin  (CRESTOR )  TRELEGY ELLIPTA   May take these medicines IF NEEDED: albuterol  (VENTOLIN  HFA) 108 (90 Base) MCG/ACT inhaler  ALPRAZolam  (XANAX )  HYDROcodone -acetaminophen  (NORCO/VICODIN)  metaxalone (SKELAXIN)  Tetrahydrozoline HCl (REDNESS RELIEVER EYE DROPS OP)   Please bring all inhalers with you the day of surgery.    Follow your surgeon's instructions on when to stop Aspirin .  If no instructions were given by your surgeon then you will need to call the office to get those instructions.    One week prior to surgery, STOP taking any Aleve, Naproxen, Ibuprofen, Motrin, Advil, Goody's, BC's, all herbal medications, fish oil, and non-prescription vitamins.  Please do not take clopidogrel  (PLAVIX ) 5 days prior to surgery. Your last dose will be 09/29/23.                     Do NOT Smoke (Tobacco/Vaping) for 24 hours prior to your procedure.  If you  use a CPAP at night, you may bring your mask/headgear for your overnight stay.   You will be asked to remove any contacts, glasses, piercing's, hearing aid's, dentures/partials prior to surgery. Please bring cases for these items if needed.    Patients discharged the day of surgery will not be allowed to drive home, and someone needs to stay with them for 24 hours.  SURGICAL WAITING ROOM VISITATION Patients may have no more than 2 support people in the waiting area - these visitors may rotate.   Pre-op nurse will coordinate an appropriate time for 1 ADULT support person, who may not rotate, to accompany patient in pre-op.  Children under the age of 30 must have an adult with them who is not the patient and must remain in the main waiting area with an adult.  If the patient needs to stay at the hospital during part of their recovery, the visitor guidelines for inpatient rooms apply.  Please refer to the [facility] website for the visitor guidelines for any additional information.   If you received a COVID test during your pre-op visit  it is requested that you wear a mask when out in public, stay away from anyone that may not be feeling well and notify your surgeon if you develop symptoms. If you have been in contact with anyone that has tested positive in the last 10 days please notify you surgeon.      Pre-operative CHG Bathing Instructions   You can play a key role in reducing the  risk of infection after surgery. Your skin needs to be as free of germs as possible. You can reduce the number of germs on your skin by washing with CHG (chlorhexidine  gluconate) soap before surgery. CHG is an antiseptic soap that kills germs and continues to kill germs even after washing.   DO NOT use if you have an allergy to chlorhexidine /CHG or antibacterial soaps. If your skin becomes reddened or irritated, stop using the CHG and notify one of our RNs at [phone].              TAKE A SHOWER THE NIGHT  BEFORE SURGERY AND THE DAY OF SURGERY    Please keep in mind the following:  DO NOT shave, including legs and underarms, 48 hours prior to surgery.   You may shave your face before/day of surgery.  Place clean sheets on your bed the night before surgery Use a clean washcloth (not used since being washed) for each shower. DO NOT sleep with pet's night before surgery.  CHG Shower Instructions:  Wash your face and private area with normal soap. If you choose to wash your hair, wash first with your normal shampoo.  After you use shampoo/soap, rinse your hair and body thoroughly to remove shampoo/soap residue.  Turn the water  OFF and apply half the bottle of CHG soap to a CLEAN washcloth.  Apply CHG soap ONLY FROM YOUR NECK DOWN TO YOUR TOES (washing for 3-5 minutes)  DO NOT use CHG soap on face, private areas, open wounds, or sores.  Pay special attention to the area where your surgery is being performed.  If you are having back surgery, having someone wash your back for you may be helpful. Wait 2 minutes after CHG soap is applied, then you may rinse off the CHG soap.  Pat dry with a clean towel  Put on clean pajamas    Additional instructions for the day of surgery: DO NOT APPLY any lotions, deodorants, cologne, or perfumes.   Do not wear jewelry or makeup Do not wear nail polish, gel polish, artificial nails, or any other type of covering on natural nails (fingers and toes) Do not bring valuables to the hospital. [facility] is not responsible for valuables/personal belongings. Put on clean/comfortable clothes.  Please brush your teeth.  Ask your nurse before applying any prescription medications to the skin.

## 2023-10-01 NOTE — Progress Notes (Signed)
 PCP - Dr. Maude Sprague Cardiologist - Dr. Redell Shallow  PPM/ICD - denies   Chest x-ray - 09/28/22 EKG - 03/19/23 Stress Test - around 2023 ECHO - 05/04/22 Cardiac Cath - 06/05/22  Sleep Study - denies   DM- denies  Last dose of GLP1 agonist-  n/a   Blood Thinner Instructions: Hold Plavix  5 days. Pt took last dose 1/8. Aspirin  Instructions: f/u with surgeon  ERAS Protcol - yes, no drink   COVID TEST- pending. Pt was swabbed due to cold-like symptoms. Pt states he had some coughing, congestion and runny nose around Christmas. Pt states symptoms resolved about a week ago, but still has a cough at times. Isaiah evaluated pt in PAT.   Anesthesia review: yes, pt evaluated in PAT by Isaiah and swabbed for COVID.   Patient denies shortness of breath, fever, cough and chest pain at PAT appointment   All instructions explained to the patient, with a verbal understanding of the material. Patient agrees to go over the instructions while at home for a better understanding.  The opportunity to ask questions was provided.

## 2023-10-01 NOTE — Progress Notes (Addendum)
 Anesthesia APP PAT Evaluation:   Case: [id number] Date/Time: 10/05/23 0715   Procedure: LAPAROSCOPIC VENTRAL HERNIA REPAAIR WITH MESH   Anesthesia type: General   Pre-op diagnosis: INCISIONAL HERNIA   [facility]: [facility] ROOM 07 / [facility]   Surgeons: Rubin Calamity, MD       DISCUSSION:  Patient is a 80 year scheduled for the above procedure.  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 He drinks 2 alcohol beverages per day.   I evaluated Gilbert Thompson during his PAT visit due to recent mild URI. Just before Christmas he developed a runny nose, congestion, productive cough. No fever. He felt well enough to go travel to [place] from 09/17/23-09/29/23. He was able to walk for miles and sight see while there. Because he had been out of the country and reported recent URI, a COVID-19 test was done (pending); However, he then reported that he actually felt recovered from his URI by Tuesday 09/21/23. He does have underlying asthma and is on Trelegy. He was also using albuterol  MDI as he does around URI symptoms, but none in several days. He does have some hoarseness, but he says this is chronic. He is followed by [facility] Dr. Brien for vocal fold atrophy and sees ST and does home voice exercises. Heart RRR, no murmur noted. His lung sounds are clear other than a faint mainly expiratory wheeze on the LUL. No crackles noted. O2 sat 100%. No coughing, conversational dyspnea noted. He was well-appearing. No ankle edema.    He is followed by cardiologist Dr. Pietro for CAD and exertional dyspnea. August 2023 echo showed LVEF 60-65%, no regional wall motion abnormalities, severe concentric LVH, grade 1 diastolic dysfunction, normal RV systolic function, normal PASP,  mild MR, trivial AI, 37 mm aortic root.  Per 04/14/23 office note, Coronary CTA August 2023 showed calcium  score 620 which was 74th percentile, moderate to severe stenosis in the mid LAD with otherwise mild nonobstructive coronary disease; there was note of 4 mm pulmonary nodule in the right middle lobe.  FFR abnormal in the LAD distribution.  Echocardiogram August 2023 showed normal LV function, severe left ventricular hypertrophy, grade 1 diastolic dysfunction, mild mitral regurgitation, trace aortic insufficiency.  Cardiac catheterization performed September 2023 showed 75% followed by 70% lesions and left ventricular end-diastolic pressure 10.  Medical therapy recommended with high risk PCI if symptoms cannot be controlled.  Since last seen the patient has dyspnea with more extreme activities but not with routine activities. It is relieved with rest. It is not associated with chest pain. There is no orthopnea, PND or pedal edema. There is no syncope or palpitations. There is no exertional chest pain. Patient states he can walk 5 miles during normal temperatures with no dyspnea or chest pain. Continued medical therapy recommended. He did not recommend additional CV testing prior to prostatectomy which Gilbert Thompson had on 04/29/23.  He had a preoperative cardiology input for this procedure outlined on 09/16/23 by West, Katlyn, NP, Mr. [facility] perioperative risk of a major cardiac event is 0.9% according to the Revised Cardiac Risk Index (RCRI).  Therefore, he is at low risk for perioperative complications.   His functional capacity is excellent at 8.33 METs according to the Duke Activity Status Index (DASI). Recommendations: According to ACC/AHA guidelines, no further cardiovascular testing needed.  The patient may proceed to surgery at acceptable risk.   Antiplatelet and/or Anticoagulation Recommendations: Per office protocol, he may hold Plavix  for 5 days prior to procedure. Please resume Plavix  as soon  as possible postprocedure, at the discretion of the surgeon. He reported last Plavix  on 09/29/23. He was advised to follow-up with surgeon's office regarding perioperative ASA instructions.    Gilbert Thompson will be just at 14 days of feeling recovered by his surgery date. Appeared well at PAT, although with some wheezing in LUL, no crackles. Sats 100%. He is compliant with Trelegy, but I asked him to take his albuterol  1-2 times per day up to surgery. COVID-19 test results are still in process. We discussed need to postpone if new or recurring respiratory symptoms and would need to consider timing if COVID test is positive. Discussed with anesthesiologist Niels Pons, MD. (UPDATE 10/02/23 10:18 PM: COVID-19 test was negative.)   VS: BP (!) 140/74   Pulse 84   Temp 36.8 C (Oral)   Resp 18   Ht 5' 9 (1.753 m)   Wt 84.6 kg   SpO2 100%   BMI 27.54 kg/m  Provider wore face mask during exam. See DISCUSSION. He does have a large ventral hernia and has to use an abdominal binder. Denied hernia incarceration, but does feel uncomfortable at times.     PROVIDERS: Windy Coy, MD is PCP  Pietro Rogue, MD is cardiologist Brien Senior, MD is ENT ([facility])   LABS: Labs reviewed: Acceptable for surgery.  (all labs ordered are listed, but only abnormal results are displayed)  Labs Reviewed  COMPREHENSIVE METABOLIC PANEL - Abnormal; Notable for the following components:      Result Value   CO2 20 (*)    Albumin 3.3 (*)    All other components within normal limits  CBC - Abnormal; Notable for the following components:   WBC 11.7 (*)    All other components within normal limits  SARS CORONAVIRUS 2 (TAT 6-24 HRS)     EKG: 03/19/23 Sinus bradycardia at 52 bpm with sinus arrhythmia with 1st degree A-V block Right bundle branch block Possible Inferior infarct , age undetermined   CV: LHC 06/05/22: CONCLUSIONS: Right dominant anatomy with no obstructive disease noted. LAD contains  tandem proximal to mid LAD lesions 75% followed by 70%.  LAD is large and transapical.  The involved segment is moderately to heavily calcified.  4 side branches arise from the segment. Ramus intermedius is large and normal. Circumflex is small. Left main is normal. LV is normal.  EDP is 10 mmHg.   RECOMMENDATIONS: Anti-ischemic therapy.  Metoprolol  succinate 25 mg a started today and should be titrated. Aggressive risk factor modification Plavix  75 mg daily started If unable to control symptoms on medical therapy, higher risk PCI could be performed with a relatively long stent in the proximal to mid vessel that would jailed for small side branches.   Echo 05/04/22: IMPRESSIONS   1. Left ventricular ejection fraction, by estimation, is 60 to 65%. The  left ventricle has normal function. The left ventricle has no regional  wall motion abnormalities. There is severe concentric left ventricular  hypertrophy. Left ventricular diastolic   parameters are consistent with Grade I diastolic dysfunction (impaired  relaxation). Elevated left ventricular end-diastolic pressure.   2. Right ventricular systolic function is normal. The right ventricular  size is normal. There is normal pulmonary artery systolic pressure.   3. The mitral valve is normal in structure. Mild mitral valve  regurgitation. No evidence of mitral stenosis.   4. The aortic valve is tricuspid. There is mild calcification of the  aortic valve. There is mild thickening of the aortic valve. Aortic valve  regurgitation is trivial. Aortic valve sclerosis/calcification is present,  without any evidence of aortic  stenosis.   5. There is borderline dilatation of the aortic root, measuring 37 mm.   6. The inferior vena cava is normal in size with greater than 50%  respiratory variability, suggesting right atrial pressure of 3 mmHg.  - Comparison(s): No prior Echocardiogram.    Past Medical History:  Diagnosis Date   Asthma     Cancer (HCC)    prostate cancer   Coronary artery disease    pt states they were unable to place a stent at that time   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, follwed by [facility]   Dyspnea    with exertion   GERD (gastroesophageal reflux disease)    Hyperlipidemia    Hypertension    Pneumonia     Past Surgical History:  Procedure Laterality Date   LEFT HEART CATH AND CORONARY ANGIOGRAPHY N/A 06/05/2022   Procedure: LEFT HEART CATH AND CORONARY ANGIOGRAPHY;  Surgeon: Claudene Victory ORN, MD;  Location: [facility] INVASIVE CV LAB;  Service: Cardiovascular;  Laterality: N/A;   LYMPHADENECTOMY Bilateral 04/29/2023   Procedure: BILATERAL PELVIC LYMPHADENECTOMY;  Surgeon: Renda Glance, MD;  Location: [facility] ORS;  Service: Urology;  Laterality: Bilateral;   PROSTATE BIOPSY     ROBOT ASSISTED LAPAROSCOPIC RADICAL PROSTATECTOMY N/A 04/29/2023   Procedure: XI ROBOTIC ASSISTED LAPAROSCOPIC RADICAL PROSTATECTOMY LEVEL 2;  Surgeon: Renda Glance, MD;  Location: [facility] ORS;  Service: Urology;  Laterality: N/A;  210 MINUTES NEEDED FOR CASE   UPPER GASTROINTESTINAL ENDOSCOPY      MEDICATIONS:  aspirin  EC 81 MG tablet   albuterol  (VENTOLIN  HFA) 108 (90 Base) MCG/ACT inhaler   allopurinol  (ZYLOPRIM ) 300 MG tablet   ALPRAZolam  (XANAX ) 1 MG tablet   cholecalciferol  (VITAMIN D3) 25 MCG (1000 UNIT) tablet   clopidogrel  (PLAVIX ) 75 MG tablet   ezetimibe  (ZETIA ) 10 MG tablet   finasteride  (PROSCAR ) 5 MG tablet   HYDROcodone -acetaminophen  (NORCO/VICODIN) 5-325 MG tablet   ibuprofen (ADVIL) 200 MG tablet   losartan  (COZAAR ) 50 MG tablet   metaxalone (SKELAXIN) 800 MG tablet   metoprolol  succinate (TOPROL  XL) 25 MG 24 hr tablet   nitroGLYCERIN  (NITROSTAT ) 0.4 MG SL tablet   pantoprazole  (PROTONIX ) 40 MG tablet   polyethylene glycol (MIRALAX  / GLYCOLAX ) 17 g packet   rosuvastatin  (CRESTOR ) 40 MG tablet   Tetrahydrozoline HCl (REDNESS RELIEVER EYE DROPS OP)   TRELEGY ELLIPTA 100-62.5-25 MCG/ACT AEPB   No current  facility-administered medications for this encounter.    Isaiah Ruder, PA-C Surgical Short Stay/Anesthesiology [facility] Phone [phone] [facility] Phone [phone] 10/01/2023 7:36 PM

## 2023-10-01 NOTE — Anesthesia Preprocedure Evaluation (Addendum)
 Anesthesia Evaluation  Patient identified by MRN, date of birth, ID band Patient awake    Reviewed: Allergy & Precautions, H&P , NPO status , Patient's Chart, lab work & pertinent test results  Airway Mallampati: III  TM Distance: >3 FB Neck ROM: Full    Dental no notable dental hx. (+) Teeth Intact, Dental Advisory Given   Pulmonary shortness of breath and with exertion, asthma    Pulmonary exam normal breath sounds clear to auscultation       Cardiovascular Exercise Tolerance: Good hypertension, Pt. on medications and Pt. on home beta blockers + CAD and + DOE   Rhythm:Regular Rate:Normal     Neuro/Psych negative neurological ROS  negative psych ROS   GI/Hepatic Neg liver ROS,GERD  Medicated,,  Endo/Other  negative endocrine ROS    Renal/GU negative Renal ROS  negative genitourinary   Musculoskeletal   Abdominal   Peds  Hematology negative hematology ROS (+)   Anesthesia Other Findings   Reproductive/Obstetrics negative OB ROS                             Anesthesia Physical Anesthesia Plan  ASA: 3  Anesthesia Plan: General   Post-op Pain Management: Tylenol  PO (pre-op)*   Induction: Intravenous  PONV Risk Score and Plan: 3 and Ondansetron , Dexamethasone  and Treatment may vary due to age or medical condition  Airway Management Planned: Oral ETT  Additional Equipment:   Intra-op Plan:   Post-operative Plan: Extubation in OR  Informed Consent: I have reviewed the patients History and Physical, chart, labs and discussed the procedure including the risks, benefits and alternatives for the proposed anesthesia with the patient or authorized representative who has indicated his/her understanding and acceptance.     Dental advisory given  Plan Discussed with: CRNA  Anesthesia Plan Comments: (PAT note written by Allison Zelenak, PA-C.  )       Anesthesia Quick  Evaluation

## 2023-10-02 LAB — SARS CORONAVIRUS 2 (TAT 6-24 HRS): SARS Coronavirus 2: NEGATIVE

## 2023-10-05 ENCOUNTER — Encounter (HOSPITAL_COMMUNITY)
Admission: RE | Disposition: A | Discharge status: Home / Self Care | Payer: Self-pay | Source: Ambulatory Visit | Attending: General Surgery

## 2023-10-05 ENCOUNTER — Ambulatory Visit (HOSPITAL_COMMUNITY)
Admission: RE | Admit: 2023-10-05 | Discharge: 2023-10-05 | Disposition: A | Discharge status: Home / Self Care | Payer: Medicare HMO | Source: Ambulatory Visit | Attending: General Surgery | Admitting: General Surgery

## 2023-10-05 ENCOUNTER — Other Ambulatory Visit: Payer: Self-pay

## 2023-10-05 ENCOUNTER — Encounter (HOSPITAL_COMMUNITY): Payer: Self-pay | Admitting: General Surgery

## 2023-10-05 ENCOUNTER — Ambulatory Visit (HOSPITAL_COMMUNITY): Payer: Self-pay | Admitting: Vascular Surgery

## 2023-10-05 ENCOUNTER — Ambulatory Visit (HOSPITAL_BASED_OUTPATIENT_CLINIC_OR_DEPARTMENT_OTHER): Payer: Medicare HMO | Admitting: Anesthesiology

## 2023-10-05 DIAGNOSIS — J45909 Unspecified asthma, uncomplicated: Secondary | ICD-10-CM | POA: Diagnosis not present

## 2023-10-05 DIAGNOSIS — K219 Gastro-esophageal reflux disease without esophagitis: Secondary | ICD-10-CM | POA: Insufficient documentation

## 2023-10-05 DIAGNOSIS — I2510 Atherosclerotic heart disease of native coronary artery without angina pectoris: Secondary | ICD-10-CM

## 2023-10-05 DIAGNOSIS — Z9079 Acquired absence of other genital organ(s): Secondary | ICD-10-CM | POA: Insufficient documentation

## 2023-10-05 DIAGNOSIS — K432 Incisional hernia without obstruction or gangrene: Secondary | ICD-10-CM

## 2023-10-05 DIAGNOSIS — Z79899 Other long term (current) drug therapy: Secondary | ICD-10-CM | POA: Diagnosis not present

## 2023-10-05 DIAGNOSIS — E785 Hyperlipidemia, unspecified: Secondary | ICD-10-CM | POA: Diagnosis not present

## 2023-10-05 DIAGNOSIS — Z7902 Long term (current) use of antithrombotics/antiplatelets: Secondary | ICD-10-CM | POA: Diagnosis not present

## 2023-10-05 DIAGNOSIS — I10 Essential (primary) hypertension: Secondary | ICD-10-CM

## 2023-10-05 DIAGNOSIS — Z8546 Personal history of malignant neoplasm of prostate: Secondary | ICD-10-CM | POA: Diagnosis not present

## 2023-10-05 DIAGNOSIS — K439 Ventral hernia without obstruction or gangrene: Secondary | ICD-10-CM | POA: Diagnosis not present

## 2023-10-05 HISTORY — PX: VENTRAL HERNIA REPAIR: SHX424

## 2023-10-05 SURGERY — REPAIR, HERNIA, VENTRAL, LAPAROSCOPIC
Anesthesia: General | Site: Abdomen

## 2023-10-05 MED ORDER — CHLORHEXIDINE GLUCONATE 0.12 % MT SOLN
15.0000 mL | Freq: Once | OROMUCOSAL | Status: AC
Start: 2023-10-05 — End: 2023-10-05
  Administered 2023-10-05: 15 mL via OROMUCOSAL
  Filled 2023-10-05: qty 15

## 2023-10-05 MED ORDER — BUPIVACAINE HCL (PF) 0.25 % IJ SOLN
INTRAMUSCULAR | Status: AC
  Filled 2023-10-05: qty 30

## 2023-10-05 MED ORDER — HYDROMORPHONE HCL 1 MG/ML IJ SOLN
INTRAMUSCULAR | Status: AC
  Filled 2023-10-05: qty 1

## 2023-10-05 MED ORDER — PROPOFOL 10 MG/ML IV BOLUS
INTRAVENOUS | Status: DC | PRN
  Administered 2023-10-05: 140 mg via INTRAVENOUS

## 2023-10-05 MED ORDER — HYDROMORPHONE HCL 1 MG/ML IJ SOLN
0.2500 mg | INTRAMUSCULAR | Status: DC | PRN
  Administered 2023-10-05 (×2): 0.5 mg via INTRAVENOUS

## 2023-10-05 MED ORDER — EPHEDRINE SULFATE-NACL 50-0.9 MG/10ML-% IV SOSY
PREFILLED_SYRINGE | INTRAVENOUS | Status: DC | PRN
  Administered 2023-10-05: 5 mg via INTRAVENOUS

## 2023-10-05 MED ORDER — LIDOCAINE 2% (20 MG/ML) 5 ML SYRINGE
INTRAMUSCULAR | Status: AC
  Filled 2023-10-05: qty 5

## 2023-10-05 MED ORDER — FENTANYL CITRATE (PF) 100 MCG/2ML IJ SOLN
25.0000 ug | INTRAMUSCULAR | Status: DC | PRN
  Administered 2023-10-05 (×3): 50 ug via INTRAVENOUS

## 2023-10-05 MED ORDER — FENTANYL CITRATE (PF) 100 MCG/2ML IJ SOLN
INTRAMUSCULAR | Status: AC
  Filled 2023-10-05: qty 2

## 2023-10-05 MED ORDER — TRAMADOL HCL 50 MG PO TABS: 50.0000 mg | ORAL_TABLET | Freq: Four times a day (QID) | ORAL | 0 refills | Status: DC | PRN

## 2023-10-05 MED ORDER — BUPIVACAINE HCL 0.25 % IJ SOLN
INTRAMUSCULAR | Status: DC | PRN
  Administered 2023-10-05: 7 mL

## 2023-10-05 MED ORDER — ACETAMINOPHEN 500 MG PO TABS
1000.0000 mg | ORAL_TABLET | ORAL | Status: AC
  Administered 2023-10-05: 1000 mg via ORAL
  Filled 2023-10-05: qty 2

## 2023-10-05 MED ORDER — MIDAZOLAM HCL 2 MG/2ML IJ SOLN
INTRAMUSCULAR | Status: DC | PRN
  Administered 2023-10-05: 2 mg via INTRAVENOUS

## 2023-10-05 MED ORDER — ROCURONIUM BROMIDE 10 MG/ML (PF) SYRINGE
PREFILLED_SYRINGE | INTRAVENOUS | Status: DC | PRN
  Administered 2023-10-05: 50 mg via INTRAVENOUS

## 2023-10-05 MED ORDER — ACETAMINOPHEN 500 MG PO TABS: 1000.0000 mg | ORAL_TABLET | Freq: Once | ORAL | Status: DC

## 2023-10-05 MED ORDER — 0.9 % SODIUM CHLORIDE (POUR BTL) OPTIME
TOPICAL | Status: DC | PRN
  Administered 2023-10-05: 1000 mL

## 2023-10-05 MED ORDER — PROPOFOL 10 MG/ML IV BOLUS
INTRAVENOUS | Status: AC
  Filled 2023-10-05: qty 20

## 2023-10-05 MED ORDER — FENTANYL CITRATE (PF) 250 MCG/5ML IJ SOLN
INTRAMUSCULAR | Status: DC | PRN
  Administered 2023-10-05: 50 ug via INTRAVENOUS

## 2023-10-05 MED ORDER — DEXAMETHASONE SODIUM PHOSPHATE 10 MG/ML IJ SOLN
INTRAMUSCULAR | Status: AC
  Filled 2023-10-05: qty 1

## 2023-10-05 MED ORDER — DEXAMETHASONE SODIUM PHOSPHATE 10 MG/ML IJ SOLN
INTRAMUSCULAR | Status: DC | PRN
  Administered 2023-10-05: 5 mg via INTRAVENOUS

## 2023-10-05 MED ORDER — CEFAZOLIN SODIUM-DEXTROSE 2-4 GM/100ML-% IV SOLN
2.0000 g | INTRAVENOUS | Status: AC
  Administered 2023-10-05: 2 g via INTRAVENOUS
  Filled 2023-10-05: qty 100

## 2023-10-05 MED ORDER — LACTATED RINGERS IV SOLN: INTRAVENOUS | Status: DC

## 2023-10-05 MED ORDER — SUGAMMADEX SODIUM 200 MG/2ML IV SOLN
INTRAVENOUS | Status: DC | PRN
  Administered 2023-10-05: 240 mg via INTRAVENOUS

## 2023-10-05 MED ORDER — ORAL CARE MOUTH RINSE: 15.0000 mL | Freq: Once | OROMUCOSAL | Status: AC

## 2023-10-05 MED ORDER — ROCURONIUM BROMIDE 10 MG/ML (PF) SYRINGE
PREFILLED_SYRINGE | INTRAVENOUS | Status: AC
  Filled 2023-10-05: qty 10

## 2023-10-05 MED ORDER — CHLORHEXIDINE GLUCONATE CLOTH 2 % EX PADS: 6.0000 | MEDICATED_PAD | Freq: Once | CUTANEOUS | Status: DC

## 2023-10-05 MED ORDER — ONDANSETRON HCL 4 MG/2ML IJ SOLN
INTRAMUSCULAR | Status: DC | PRN
  Administered 2023-10-05: 4 mg via INTRAVENOUS

## 2023-10-05 MED ORDER — FENTANYL CITRATE (PF) 250 MCG/5ML IJ SOLN
INTRAMUSCULAR | Status: AC
  Filled 2023-10-05: qty 5

## 2023-10-05 MED ORDER — MIDAZOLAM HCL 2 MG/2ML IJ SOLN
INTRAMUSCULAR | Status: AC
  Filled 2023-10-05: qty 2

## 2023-10-05 MED ORDER — ONDANSETRON HCL 4 MG/2ML IJ SOLN
INTRAMUSCULAR | Status: AC
  Filled 2023-10-05: qty 2

## 2023-10-05 MED ORDER — LIDOCAINE 2% (20 MG/ML) 5 ML SYRINGE
INTRAMUSCULAR | Status: DC | PRN
  Administered 2023-10-05: 60 mg via INTRAVENOUS

## 2023-10-05 SURGICAL SUPPLY — 42 items
APPLIER CLIP 5 13 M/L LIGAMAX5 (MISCELLANEOUS)
BAG COUNTER SPONGE SURGICOUNT (BAG) ×1 IMPLANT
CANISTER SUCT 3000ML PPV (MISCELLANEOUS) IMPLANT
CHLORAPREP W/TINT 26 (MISCELLANEOUS) ×1 IMPLANT
CLIP APPLIE 5 13 M/L LIGAMAX5 (MISCELLANEOUS) IMPLANT
COVER SURGICAL LIGHT HANDLE (MISCELLANEOUS) ×1 IMPLANT
DERMABOND ADVANCED .7 DNX12 (GAUZE/BANDAGES/DRESSINGS) ×1 IMPLANT
DEVICE SECURE STRAP 25 ABSORB (INSTRUMENTS) ×1 IMPLANT
DEVICE TROCAR PUNCTURE CLOSURE (ENDOMECHANICALS) ×1 IMPLANT
ELECT REM PT RETURN 9FT ADLT (ELECTROSURGICAL) ×1
ELECTRODE REM PT RTRN 9FT ADLT (ELECTROSURGICAL) ×1 IMPLANT
GAUZE SPONGE 4X4 12PLY STRL (GAUZE/BANDAGES/DRESSINGS) IMPLANT
GLOVE BIO SURGEON STRL SZ7.5 (GLOVE) ×2 IMPLANT
GOWN STRL REUS W/ TWL LRG LVL3 (GOWN DISPOSABLE) ×2 IMPLANT
GOWN STRL REUS W/ TWL XL LVL3 (GOWN DISPOSABLE) ×1 IMPLANT
IRRIG SUCT STRYKERFLOW 2 WTIP (MISCELLANEOUS)
IRRIGATION SUCT STRKRFLW 2 WTP (MISCELLANEOUS) IMPLANT
KIT BASIN OR (CUSTOM PROCEDURE TRAY) ×1 IMPLANT
KIT TURNOVER KIT B (KITS) ×1 IMPLANT
MESH VENTRLGHT ELLIPSE 8X6XMFL (Mesh Specialty) IMPLANT
NDL INSUFFLATION 14GA 120MM (NEEDLE) ×1 IMPLANT
NEEDLE INSUFFLATION 14GA 120MM (NEEDLE) ×1
NS IRRIG 1000ML POUR BTL (IV SOLUTION) ×1 IMPLANT
PAD ARMBOARD 7.5X6 YLW CONV (MISCELLANEOUS) ×2 IMPLANT
POUCH LAPAROSCOPIC INSTRUMENT (MISCELLANEOUS) ×1 IMPLANT
SCISSORS LAP 5X35 DISP (ENDOMECHANICALS) ×1 IMPLANT
SET TUBE SMOKE EVAC HIGH FLOW (TUBING) ×1 IMPLANT
SHEARS HARMONIC ACE PLUS 36CM (ENDOMECHANICALS) IMPLANT
SLEEVE Z-THREAD 5X100MM (TROCAR) ×1 IMPLANT
SUT CHROMIC 2 0 SH (SUTURE) ×1 IMPLANT
SUT ETHIBOND 0 MO6 C/R (SUTURE) IMPLANT
SUT MNCRL AB 4-0 PS2 18 (SUTURE) ×1 IMPLANT
SUT NOVA 1 T20/GS 25DT (SUTURE) IMPLANT
SUT PROLENE 2 0 KS (SUTURE) IMPLANT
TOWEL GREEN STERILE (TOWEL DISPOSABLE) ×1 IMPLANT
TOWEL GREEN STERILE FF (TOWEL DISPOSABLE) ×1 IMPLANT
TRAY FOLEY W/BAG SLVR 16FR ST (SET/KITS/TRAYS/PACK) IMPLANT
TRAY LAPAROSCOPIC MC (CUSTOM PROCEDURE TRAY) ×1 IMPLANT
TROCAR 11X100 Z THREAD (TROCAR) IMPLANT
TROCAR Z-THREAD OPTICAL 5X100M (TROCAR) ×1 IMPLANT
WARMER LAPAROSCOPE (MISCELLANEOUS) ×1 IMPLANT
WATER STERILE IRR 1000ML POUR (IV SOLUTION) ×1 IMPLANT

## 2023-10-05 NOTE — Anesthesia Postprocedure Evaluation (Signed)
 Anesthesia Post Note  Patient: Gilbert Thompson  Procedure(s) Performed: LAPAROSCOPIC VENTRAL HERNIA REPAAIR WITH MESH (Abdomen)     Patient location during evaluation: PACU Anesthesia Type: General Level of consciousness: awake and alert Pain management: pain level controlled Vital Signs Assessment: post-procedure vital signs reviewed and stable Respiratory status: spontaneous breathing, nonlabored ventilation and respiratory function stable Cardiovascular status: blood pressure returned to baseline and stable Postop Assessment: no apparent nausea or vomiting Anesthetic complications: no  No notable events documented.  Last Vitals:  Vitals:   10/05/23 0915 10/05/23 0930  BP: (!) 119/55 113/72  Pulse: 68 70  Resp: 14 19  Temp:  (!) 36.4 C  SpO2: 92% 95%    Last Pain:  Vitals:   10/05/23 0930  TempSrc:   PainSc: 4                  Damek Ende,W. EDMOND

## 2023-10-05 NOTE — Anesthesia Procedure Notes (Signed)
 Procedure Name: Intubation Date/Time: 10/05/2023 7:34 AM  Performed by: Elby Raelene SAUNDERS, CRNAPre-anesthesia Checklist: Patient identified, Emergency Drugs available, Suction available and Patient being monitored Patient Re-evaluated:Patient Re-evaluated prior to induction Oxygen Delivery Method: Circle System Utilized Preoxygenation: Pre-oxygenation with 100% oxygen Induction Type: IV induction Ventilation: Mask ventilation without difficulty Laryngoscope Size: Mac and 2 Tube type: Oral Tube size: 7.5 mm Number of attempts: 1 Airway Equipment and Method: Stylet Placement Confirmation: ETT inserted through vocal cords under direct vision, positive ETCO2 and breath sounds checked- equal and bilateral Secured at: 24 cm Tube secured with: Tape Dental Injury: Teeth and Oropharynx as per pre-operative assessment

## 2023-10-05 NOTE — H&P (Signed)
 Chief Complaint: New Consultation       History of Present Illness: Gilbert Thompson is a 73 y.o. male who is seen today as an office consultation at the request of Dr. Windy for evaluation of New Consultation .     Patient is a 73 year old male with a history of prostate cancer status post robotic assisted radical prostatectomy in August 2024.   Patient states that thereafter he had a coughing episode and felt a bulge to the midline incision site.  This is just above the umbilicus.  He states that since that time he has felt the hernia gotten somewhat larger.  He has had no significant pain.   He had no signs or symptoms of incarceration or strangulation.   He had no previous abdominal surgery.   Patient currently is on Plavix  and was cleared for surgery in July of last year for his prostatectomy.         Review of Systems: A complete review of systems was obtained from the patient.  I have reviewed this information and discussed as appropriate with the patient.  See HPI as well for other ROS.   Review of Systems  Constitutional:  Negative for fever.  HENT:  Negative for congestion.   Eyes:  Negative for blurred vision.  Respiratory:  Negative for cough, shortness of breath and wheezing.   Cardiovascular:  Negative for chest pain and palpitations.  Gastrointestinal:  Negative for heartburn.  Genitourinary:  Negative for dysuria.  Musculoskeletal:  Negative for myalgias.  Skin:  Negative for rash.  Neurological:  Negative for dizziness and headaches.  Psychiatric/Behavioral:  Negative for depression and suicidal ideas.   All other systems reviewed and are negative.       Medical History: Past Medical History: DiagnosisDate            Asthma, unspecified asthma severity, unspecified whether complicated, unspecified whether persistent (HHS-HCC)              GERD (gastroesophageal reflux disease)                  History of cancer              There is no problem list  on file for this patient.     Past Surgical History: ProcedureLateralityDate            PROSTATE SURGERY                          No Known Allergies   Current Outpatient Medications on File Prior to Visit MedicationSigDispenseRefill            allopurinoL  (ZYLOPRIM ) 300 MG tablet        Take 300 mg by mouth once daily                             ALPRAZolam  (XANAX ) 1 MG tablet  Take 1 mg by mouth                           cholecalciferol  (VITAMIN D3) 1000 unit tablet           Take 1,000 Units by mouth once daily                               clopidogreL  (PLAVIX ) 75 mg tablet  Take 75 mg by mouth once daily                               losartan  (COZAAR ) 50 MG tablet       Take 50 mg by mouth once daily                               metoprolol  SUCCinate (TOPROL -XL) 25 MG XL tablet        Take 25 mg by mouth once daily                               nitroGLYcerin  (NITROSTAT ) 0.4 MG SL tablet         Place 0.4 mg under the tongue every 5 (five) minutes as needed                              omeprazole  (PRILOSEC) 20 MG DR capsule           Take 20 mg by mouth once daily                   No current facility-administered medications on file prior to visit.     Family History ProblemRelationAge of Onset            Skin cancer     Father              High blood pressure (Hypertension)   Father              Hyperlipidemia (Elevated cholesterol)            Father              Coronary Artery Disease (Blocked arteries around heart)     Father       Social History   Tobacco Use Smoking StatusNever Smokeless TobaccoNever     Social History   Socioeconomic History Marital status:Married Tobacco Use Smoking status:Never Smokeless tobacco:Never Substance and Sexual Activity Alcohol use:Yes Drug ldz:Wzczm   Social Drivers of Health   Food Insecurity: No Food Insecurity (04/29/2023)             Received from [facility]             Hunger Vital Sign                        Worried  About Running Out of Food in the Last Year: Never true                        Ran Out of Food in the Last Year: Never true Transportation Needs: No Transportation Needs (04/29/2023)             Received from [facility]                        Lack of Transportation (Medical): No                        Lack of Transportation (Non-Medical): No     Objective:     BP  128/70   Pulse (!) 57   Temp 97.9 F (36.6 C) (Oral)   Resp 17   Ht 5' 9 (1.753 m)   Wt 83.9 kg   SpO2 97%   BMI 27.32 kg/m     Physical Exam Constitutional:      General: He is not in acute distress.    Appearance: Normal appearance.  HENT:     Head: Normocephalic.     Nose: No rhinorrhea.     Mouth/Throat:     Mouth: Mucous membranes are moist.     Pharynx: Oropharynx is clear.  Eyes:     General: No scleral icterus.    Pupils: Pupils are equal, round, and reactive to light.  Cardiovascular:     Rate and Rhythm: Normal rate.     Pulses: Normal pulses.  Pulmonary:     Effort: Pulmonary effort is normal. No respiratory distress.     Breath sounds: No stridor. No wheezing.  Abdominal:     General: Abdomen is flat. There is no distension.     Tenderness: There is no abdominal tenderness. There is no guarding or rebound.     Hernia: A hernia is present.         Comments: 4-5cm hernia   Musculoskeletal:        General: Normal range of motion.     Cervical back: Normal range of motion and neck supple.  Skin:    General: Skin is warm and dry.     Capillary Refill: Capillary refill takes less than 2 seconds.     Coloration: Skin is not jaundiced.  Neurological:     General: No focal deficit present.     Mental Status: He is alert and oriented to person, place, and time. Mental status is at baseline.  Psychiatric:        Mood and Affect: Mood normal.        Thought Content: Thought content normal.        Judgment: Judgment normal.        Hernia Size: 4-5  cm Incarcerated: No Initial Hernia     Assessment and Plan: Diagnoses and all orders for this visit:   Incisional hernia without obstruction or gangrene     Gilbert Thompson is a 73 y.o. male    1.          We will proceed to the OR for a lap ventral hernia repair with mesh. 2.         All risks and benefits were discussed with the patient, to generally include infection, bleeding, damage to surrounding structures, acute and chronic nerve pain, and recurrence. Alternatives were offered and described.  All questions were answered and the patient voiced understanding of the procedure and wishes to proceed at this point.             No follow-ups on file.   Lynda Leos, MD, [facility] Surgery, [place] General & Minimally Invasive Surgery

## 2023-10-05 NOTE — Op Note (Signed)
 10/05/2023  8:12 AM  PATIENT:  Gilbert Thompson  73 y.o. male  PRE-OPERATIVE DIAGNOSIS:  6cm INCISIONAL HERNIA  POST-OPERATIVE DIAGNOSIS:  6cm INCISIONAL HERNIA  PROCEDURE:  Procedure(s): LAPAROSCOPIC VENTRAL HERNIA REPAAIR WITH MESH (N/A)  SURGEON:  Surgeons and Role:    DEWAINE Rubin Calamity, MD - Primary   ASSISTANTS: Waddell Collier, RNFA   ANESTHESIA:   local and general  EBL:  minimal   BLOOD ADMINISTERED:none  DRAINS: none   LOCAL MEDICATIONS USED:  BUPIVICAINE   SPECIMEN:  No Specimen  DISPOSITION OF SPECIMEN:  N/A  COUNTS:  YES  TOURNIQUET:  * No tourniquets in log *  DICTATION: .Dragon Dictation  Findings: 6cm incsional hernia.  20x15cm Ventralight ST placed as an underlay  Details of the procedure:  After the patient was consented patient was taken back to the operating room patient was then placed in supine position bilateral SCDs in place.  The patient was prepped and draped in the usual sterile fashion. After antibiotics were confirmed a timeout was called and all facts were verified. The Veress needle technique was used to insuflate the abdomen at Palmer's point. The abdomen was insufflated to 14 mm mercury. Subsequently a 5 mm trocar was placed a camera inserted there was no injury to any intra-abdominal organs.    There was seen to be an non-incarcerated  incisional, 6cm hernia.  A second camera port was in placed into the left lower quadrant.   At this the Falicform ligament was taken down with Bovie cautery maintaining hemostasis.   I proceeded to reduce the hernia contents.  The hernia sac was dissected out of the hernia and disposed.  The fascia at the hernia was reapproximated using a #0 Ethibonds x 4.  Once the hernia was cleared away, a Bard Ventralight 20x15cm  mesh was inserted into the abdomen.  The mesh was secured circumferentially with am Securestrap tacker in a double crown fashion.    The omentum was brought over the area of the mesh. The  fascia at the left lower quadrant port site was reapproximated with a 0 vicryl and an endoclose device.  The pneumoperitoneum was evacuated & all trocars  were removed. The skin was reapproximated with 4-0  Monocryl sutures in a subcuticular fashion. The skin was dressed with Dermabond.  The patient was taken to the recovery room in stable condition.  Type of repair -primary suture & mesh  Mesh overlap - 8cm  Placement of mesh -  beneath fascia and into peritoneal cavity  Size: 3-10cm, Primary Hernia, and Reducible Hernia   PLAN OF CARE: Discharge to home after PACU  PATIENT DISPOSITION:  PACU - hemodynamically stable.   Delay start of Pharmacological VTE agent (>24hrs) due to surgical blood loss or risk of bleeding: not applicable

## 2023-10-05 NOTE — Discharge Instructions (Signed)

## 2023-10-05 NOTE — Transfer of Care (Signed)
 Immediate Anesthesia Transfer of Care Note  Patient: Gilbert Thompson  Procedure(s) Performed: LAPAROSCOPIC VENTRAL HERNIA REPAAIR WITH MESH (Abdomen)  Patient Location: PACU  Anesthesia Type:General  Level of Consciousness: awake, alert , and oriented  Airway & Oxygen Therapy: Patient Spontanous Breathing  Post-op Assessment: Report given to RN and Post -op Vital signs reviewed and stable  Post vital signs: Reviewed and stable  Last Vitals:  Vitals Value Taken Time  BP 145/68 10/05/23 0824  Temp    Pulse 64 10/05/23 0826  Resp 10 10/05/23 0826  SpO2 97 % 10/05/23 0826  Vitals shown include unfiled device data.  Last Pain:  Vitals:   10/05/23 0619  TempSrc:   PainSc: 0-No pain         Complications: No notable events documented.

## 2023-10-06 ENCOUNTER — Encounter (HOSPITAL_COMMUNITY): Payer: Self-pay | Admitting: General Surgery

## 2023-10-18 NOTE — Progress Notes (Signed)
 HPI: Follow-up coronary artery disease and dyspnea.  Coronary CTA August 2023 showed calcium  score 620 which was 74th percentile, moderate to severe stenosis in the mid LAD with otherwise mild nonobstructive coronary disease; there was note of 4 mm pulmonary nodule in the right middle lobe; no FU if pt low risk.  FFR abnormal in the LAD distribution.  Echocardiogram August 2023 showed normal LV function, severe left ventricular hypertrophy, grade 1 diastolic dysfunction, mild mitral regurgitation, trace aortic insufficiency. Cardiac catheterization performed September 2023 showed 75% followed by 70% lesions and left ventricular end-diastolic pressure 10.  Medical therapy recommended with high risk PCI if symptoms cannot be controlled.  Since last seen the patient has dyspnea with more extreme activities but not with routine activities. It is relieved with rest. It is not associated with chest pain. There is no orthopnea, PND or pedal edema. There is no syncope or palpitations. There is no exertional chest pain.   Current Outpatient Medications  Medication Sig Dispense Refill   albuterol  (VENTOLIN  HFA) 108 (90 Base) MCG/ACT inhaler Inhale 1-2 puffs into the lungs every 6 (six) hours as needed for wheezing or shortness of breath.     allopurinol  (ZYLOPRIM ) 300 MG tablet Take 300 mg by mouth daily.     ALPRAZolam  (XANAX ) 1 MG tablet Take 1 mg by mouth at bedtime as needed for anxiety or sleep.     aspirin  EC 81 MG tablet Take 81 mg by mouth daily. Swallow whole.     cholecalciferol  (VITAMIN D3) 25 MCG (1000 UNIT) tablet Take 1,000 Units by mouth daily.     clopidogrel  (PLAVIX ) 75 MG tablet Take 75 mg by mouth daily.     ezetimibe  (ZETIA ) 10 MG tablet Take 1 tablet (10 mg total) by mouth daily. 90 tablet 3   finasteride  (PROSCAR ) 5 MG tablet Take 1.25 mg by mouth daily.     HYDROcodone -acetaminophen  (NORCO/VICODIN) 5-325 MG tablet Take 1 tablet by mouth every 6 (six) hours as needed for severe pain.      ibuprofen (ADVIL) 200 MG tablet Take 600 mg by mouth daily as needed for moderate pain (pain score 4-6).     losartan  (COZAAR ) 50 MG tablet Take 1 tablet (50 mg total) by mouth daily. 90 tablet 3   metaxalone (SKELAXIN) 800 MG tablet Take 400 mg by mouth 3 (three) times daily as needed for muscle spasms.     metoprolol  succinate (TOPROL  XL) 25 MG 24 hr tablet Take 1 tablet (25 mg total) by mouth daily. 90 tablet 3   pantoprazole  (PROTONIX ) 40 MG tablet Take 1 tablet (40 mg total) by mouth 2 (two) times daily. 180 tablet 3   polyethylene glycol (MIRALAX  / GLYCOLAX ) 17 g packet Take 17 g by mouth daily as needed for moderate constipation.     rosuvastatin  (CRESTOR ) 40 MG tablet Take 1 tablet (40 mg total) by mouth daily. 90 tablet 3   Tetrahydrozoline HCl (REDNESS RELIEVER EYE DROPS OP) Place 1 drop into both eyes daily as needed (redness).     traMADol  (ULTRAM ) 50 MG tablet Take 1 tablet (50 mg total) by mouth every 6 (six) hours as needed. 20 tablet 0   TRELEGY ELLIPTA 100-62.5-25 MCG/ACT AEPB Inhale 1 puff into the lungs daily.     nitroGLYCERIN  (NITROSTAT ) 0.4 MG SL tablet Place 1 tablet (0.4 mg total) under the tongue every 5 (five) minutes as needed for chest pain. 90 tablet 3   No current facility-administered medications for this visit.  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, follwed by [facility]   Dyspnea    with exertion   GERD (gastroesophageal reflux disease)    Hyperlipidemia    Hypertension    Pneumonia     Past Surgical History:  Procedure Laterality Date   CATARACT EXTRACTION Bilateral    LEFT HEART CATH AND CORONARY ANGIOGRAPHY N/A 06/05/2022   Procedure: LEFT HEART CATH AND CORONARY ANGIOGRAPHY;  Surgeon: Claudene Victory ORN, MD;  [facility];  Service: Cardiovascular;   Laterality: N/A;   LYMPHADENECTOMY Bilateral 04/29/2023   Procedure: BILATERAL PELVIC LYMPHADENECTOMY;  Surgeon: Renda Glance, MD;  Location: [facility] OR[facility];  Service: Urology;  Laterality: Bilateral;   PROSTATE BIOPSY     ROBOT ASSISTED LAPAROSCOPIC RADICAL PROSTATECTOMY N/A 04/29/2023   Procedure: XI ROBOTIC ASSISTED LAPAROSCOPIC RADICAL PROSTATECTOMY LEVEL 2;  Surgeon: Renda Glance, MD;  Location: [facility];  Service: Urology;  Laterality: N/A;  210 MINUTES NEEDED FOR CASE   UPPER GASTROINTESTINAL ENDOSCOPY     VENTRAL HERNIA REPAIR N/A 10/05/2023   Procedure: LAPAROSCOPIC VENTRAL HERNIA REPAAIR WITH MESH;  Surgeon: Rubin Calamity, MD;  Location: [facility] OR;  Service: General;  Laterality: N/A;    Social History   Socioeconomic History   Marital status: Married    Spouse name: Not on file   Number of children: 1   Years of education: Not on file   Highest education level: Not on file  Occupational History   Not on file  Tobacco Use   Smoking status: Never   Smokeless tobacco: Never  Vaping Use   Vaping status: Never Used  Substance and Sexual Activity   Alcohol use: Yes    Alcohol/week: 14.0 standard drinks of alcohol    Types: 14 Standard drinks or equivalent per week    Comment: 2 drinks per day   Drug use: Never   Sexual activity: Not on file  Other Topics Concern   Not on file  Social History Narrative   Not on file   Social Drivers of Health   Financial Resource Strain: Not on file  Food Insecurity: No Food Insecurity (04/29/2023)   Hunger Vital Sign    Worried About Running Out of Food in the Last Year: Never true    Ran Out of Food in the Last Year: Never true  Transportation Needs: No Transportation Needs (04/29/2023)   PRAPARE - Administrator, Civil Service (Medical): No    Lack of Transportation (Non-Medical): No  Physical Activity: Not on file  Stress: Not on file  Social Connections: Not on file  Intimate Partner Violence: Not At Risk (04/29/2023)    Humiliation, Afraid, Rape, and Kick questionnaire    Fear of Current or Ex-Partner: No    Emotionally Abused: No    Physically Abused: No    Sexually Abused: No    Family History  Problem Relation Age of Onset   Coronary artery disease Father    Prostate cancer Father 32 - 45   Leukemia Father 30   Coronary artery disease Brother    Prostate cancer Brother 2 - 59   Colon cancer Brother 48   Pancreatic cancer Cousin        maternal first cousin  Breast cancer Daughter 93       negative genetic testing   Esophageal cancer Neg Hx    Rectal cancer Neg Hx    Stomach cancer Neg Hx     ROS: no fevers or chills, productive cough, hemoptysis, dysphasia, odynophagia, melena, hematochezia, dysuria, hematuria, rash, seizure activity, orthopnea, PND, pedal edema, claudication. Remaining systems are negative.  Physical Exam: Well-developed well-nourished in no acute distress.  Skin is warm and dry.  HEENT is normal.  Neck is supple.  Chest is clear to auscultation with normal expansion.  Cardiovascular exam is regular rate and rhythm.  Abdominal exam nontender or distended. No masses palpated. Extremities show no edema. neuro grossly intact  A/P  1 coronary artery disease-patient doing well with no chest pain.  Continue medical therapy including aspirin , beta-blocker and statin.  Discontinue aspirin .  2 hyperlipidemia-continue statin.  Check lipids.  3 hypertension-blood pressure controlled.  Continue present medical regimen.  4 lung nodule-we will arrange noncontrast chest CT to follow-up.  Redell Shallow, MD

## 2023-10-20 DIAGNOSIS — D692 Other nonthrombocytopenic purpura: Secondary | ICD-10-CM | POA: Diagnosis not present

## 2023-10-20 DIAGNOSIS — L821 Other seborrheic keratosis: Secondary | ICD-10-CM | POA: Diagnosis not present

## 2023-10-20 DIAGNOSIS — D225 Melanocytic nevi of trunk: Secondary | ICD-10-CM | POA: Diagnosis not present

## 2023-10-20 DIAGNOSIS — D2271 Melanocytic nevi of right lower limb, including hip: Secondary | ICD-10-CM | POA: Diagnosis not present

## 2023-10-22 DIAGNOSIS — Z8719 Personal history of other diseases of the digestive system: Secondary | ICD-10-CM | POA: Diagnosis not present

## 2023-10-22 DIAGNOSIS — Z9889 Other specified postprocedural states: Secondary | ICD-10-CM | POA: Diagnosis not present

## 2023-10-26 ENCOUNTER — Ambulatory Visit: Payer: Medicare HMO | Attending: Cardiology | Admitting: Cardiology

## 2023-10-26 ENCOUNTER — Encounter: Payer: Self-pay | Admitting: Cardiology

## 2023-10-26 VITALS — BP 120/60 | HR 60 | Ht 69.0 in | Wt 183.4 lb

## 2023-10-26 DIAGNOSIS — I2510 Atherosclerotic heart disease of native coronary artery without angina pectoris: Secondary | ICD-10-CM

## 2023-10-26 DIAGNOSIS — R911 Solitary pulmonary nodule: Secondary | ICD-10-CM

## 2023-10-26 DIAGNOSIS — I10 Essential (primary) hypertension: Secondary | ICD-10-CM | POA: Diagnosis not present

## 2023-10-26 DIAGNOSIS — E785 Hyperlipidemia, unspecified: Secondary | ICD-10-CM

## 2023-10-26 LAB — LIPID PANEL
Chol/HDL Ratio: 1.7 {ratio} (ref 0.0–5.0)
Cholesterol, Total: 131 mg/dL (ref 100–199)
HDL: 76 mg/dL (ref >39)
LDL Chol Calc (NIH): 36 mg/dL (ref 0–99)
Triglycerides: 107 mg/dL (ref 0–149)
VLDL Cholesterol Cal: 19 mg/dL (ref 5–40)

## 2023-10-26 NOTE — Patient Instructions (Signed)
 Medication Instructions:   STOP PLAVIX   *If you need a refill on your cardiac medications before your next appointment, please call your pharmacy*   Lab Work:  Your physician recommends that you HAVE LAB WORK TODAY  If you have labs (blood work) drawn today and your tests are completely normal, you will receive your results only by: MyChart Message (if you have MyChart) OR A paper copy in the mail If you have any lab test that is abnormal or we need to change your treatment, we will call you to review the results.   Testing/Procedures:  CT WO CONTRAST AT [facility]   Follow-Up: At [facility], you and your health needs are our priority.  As part of our continuing mission to provide you with exceptional heart care, we have created designated Provider Care Teams.  These Care Teams include your primary Cardiologist (physician) and Advanced Practice Providers (APPs -  Physician Assistants and Nurse Practitioners) who all work together to provide you with the care you need, when you need it.    Your next appointment:   6 month(s)  Provider:   Redell Shallow, MD

## 2023-10-27 ENCOUNTER — Encounter: Payer: Self-pay | Admitting: Cardiology

## 2023-10-27 ENCOUNTER — Encounter: Payer: Self-pay | Admitting: *Deleted

## 2023-10-28 ENCOUNTER — Ambulatory Visit
Admission: RE | Admit: 2023-10-28 | Discharge: 2023-10-28 | Disposition: A | Discharge status: Home / Self Care | Payer: Medicare HMO | Source: Ambulatory Visit | Attending: Cardiology | Admitting: Cardiology

## 2023-10-28 DIAGNOSIS — R911 Solitary pulmonary nodule: Secondary | ICD-10-CM

## 2023-10-28 DIAGNOSIS — I2510 Atherosclerotic heart disease of native coronary artery without angina pectoris: Secondary | ICD-10-CM | POA: Diagnosis not present

## 2023-11-08 DIAGNOSIS — Z8719 Personal history of other diseases of the digestive system: Secondary | ICD-10-CM | POA: Diagnosis not present

## 2023-11-08 DIAGNOSIS — Z9889 Other specified postprocedural states: Secondary | ICD-10-CM | POA: Diagnosis not present

## 2023-11-09 ENCOUNTER — Other Ambulatory Visit: Payer: Self-pay | Admitting: *Deleted

## 2023-11-09 ENCOUNTER — Encounter: Payer: Self-pay | Admitting: *Deleted

## 2023-11-09 DIAGNOSIS — R911 Solitary pulmonary nodule: Secondary | ICD-10-CM

## 2024-02-23 DIAGNOSIS — N5201 Erectile dysfunction due to arterial insufficiency: Secondary | ICD-10-CM | POA: Diagnosis not present

## 2024-02-23 DIAGNOSIS — C61 Malignant neoplasm of prostate: Secondary | ICD-10-CM | POA: Diagnosis not present

## 2024-02-23 DIAGNOSIS — R8271 Bacteriuria: Secondary | ICD-10-CM | POA: Diagnosis not present

## 2024-03-06 DIAGNOSIS — L659 Nonscarring hair loss, unspecified: Secondary | ICD-10-CM | POA: Diagnosis not present

## 2024-03-06 DIAGNOSIS — K2270 Barrett's esophagus without dysplasia: Secondary | ICD-10-CM | POA: Diagnosis not present

## 2024-03-06 DIAGNOSIS — Z0000 Encounter for general adult medical examination without abnormal findings: Secondary | ICD-10-CM | POA: Diagnosis not present

## 2024-03-06 DIAGNOSIS — K219 Gastro-esophageal reflux disease without esophagitis: Secondary | ICD-10-CM | POA: Diagnosis not present

## 2024-03-06 DIAGNOSIS — M4802 Spinal stenosis, cervical region: Secondary | ICD-10-CM | POA: Diagnosis not present

## 2024-03-06 DIAGNOSIS — E7800 Pure hypercholesterolemia, unspecified: Secondary | ICD-10-CM | POA: Diagnosis not present

## 2024-03-06 DIAGNOSIS — M48061 Spinal stenosis, lumbar region without neurogenic claudication: Secondary | ICD-10-CM | POA: Diagnosis not present

## 2024-03-06 DIAGNOSIS — I10 Essential (primary) hypertension: Secondary | ICD-10-CM | POA: Diagnosis not present

## 2024-03-06 DIAGNOSIS — C61 Malignant neoplasm of prostate: Secondary | ICD-10-CM | POA: Diagnosis not present

## 2024-03-06 DIAGNOSIS — I2510 Atherosclerotic heart disease of native coronary artery without angina pectoris: Secondary | ICD-10-CM | POA: Diagnosis not present

## 2024-03-06 DIAGNOSIS — J4530 Mild persistent asthma, uncomplicated: Secondary | ICD-10-CM | POA: Diagnosis not present

## 2024-03-06 DIAGNOSIS — R911 Solitary pulmonary nodule: Secondary | ICD-10-CM | POA: Diagnosis not present

## 2024-03-08 DIAGNOSIS — M109 Gout, unspecified: Secondary | ICD-10-CM | POA: Diagnosis not present

## 2024-03-28 ENCOUNTER — Other Ambulatory Visit: Payer: Self-pay | Admitting: Family Medicine

## 2024-03-28 DIAGNOSIS — R911 Solitary pulmonary nodule: Secondary | ICD-10-CM

## 2024-03-28 DIAGNOSIS — J370 Chronic laryngitis: Secondary | ICD-10-CM

## 2024-04-06 ENCOUNTER — Ambulatory Visit
Admission: RE | Admit: 2024-04-06 | Discharge: 2024-04-06 | Disposition: A | Discharge status: Home / Self Care | Source: Ambulatory Visit | Attending: Family Medicine | Admitting: Family Medicine

## 2024-04-06 DIAGNOSIS — R911 Solitary pulmonary nodule: Secondary | ICD-10-CM | POA: Diagnosis not present

## 2024-04-06 DIAGNOSIS — J370 Chronic laryngitis: Secondary | ICD-10-CM

## 2024-04-06 MED ORDER — IOPAMIDOL (ISOVUE-370) INJECTION 76%
75.0000 mL | Freq: Once | INTRAVENOUS | Status: AC | PRN
  Administered 2024-04-06: 75 mL via INTRAVENOUS

## 2024-04-10 ENCOUNTER — Telehealth: Payer: Self-pay | Admitting: Cardiology

## 2024-04-10 NOTE — Telephone Encounter (Addendum)
 CT resulted was ordered by this PCP, they should have already received results. Order placed by cardiology for same test that was not resulted.   Faxed electronically, the latest CT chest w/o contrast resulted 04/07/24 to Dr. Windy at [phone] as requested.

## 2024-04-10 NOTE — Telephone Encounter (Signed)
 Pts PCP requesting CT results be sent to [phone]

## 2024-04-23 DIAGNOSIS — R221 Localized swelling, mass and lump, neck: Secondary | ICD-10-CM | POA: Insufficient documentation

## 2024-04-24 ENCOUNTER — Other Ambulatory Visit: Payer: Self-pay | Admitting: Family Medicine

## 2024-04-24 DIAGNOSIS — R221 Localized swelling, mass and lump, neck: Secondary | ICD-10-CM | POA: Diagnosis not present

## 2024-04-24 DIAGNOSIS — M4802 Spinal stenosis, cervical region: Secondary | ICD-10-CM

## 2024-04-24 DIAGNOSIS — C102 Malignant neoplasm of lateral wall of oropharynx: Secondary | ICD-10-CM

## 2024-04-26 DIAGNOSIS — R221 Localized swelling, mass and lump, neck: Secondary | ICD-10-CM | POA: Diagnosis not present

## 2024-04-26 DIAGNOSIS — C61 Malignant neoplasm of prostate: Secondary | ICD-10-CM | POA: Diagnosis not present

## 2024-04-26 DIAGNOSIS — Z8546 Personal history of malignant neoplasm of prostate: Secondary | ICD-10-CM | POA: Diagnosis not present

## 2024-04-26 NOTE — Progress Notes (Signed)
 HPI: Follow-up coronary artery disease and dyspnea.  Coronary CTA August 2023 showed calcium  score 620 which was 74th percentile, moderate to severe stenosis in the mid LAD with otherwise mild nonobstructive coronary disease; there was note of 4 mm pulmonary nodule in the right middle lobe; no FU if pt low risk.  FFR abnormal in the LAD distribution.  Echocardiogram August 2023 showed normal LV function, severe left ventricular hypertrophy, grade 1 diastolic dysfunction, mild mitral regurgitation, trace aortic insufficiency. Cardiac catheterization performed September 2023 showed 75% followed by 70% lesions in the LAD and left ventricular end-diastolic pressure 10.  Medical therapy recommended with high risk PCI if symptoms cannot be controlled.  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  Since last seen he denies dyspnea on exertion, exertional chest pain or syncope.  Some dizziness with standing.  Note he can ambulate 3 to 4 miles playing golf with no dyspnea or chest pain.  Current Outpatient Medications  Medication Sig Dispense Refill   albuterol  (VENTOLIN  HFA) 108 (90 Base) MCG/ACT inhaler Inhale 1-2 puffs into the lungs every 6 (six) hours as needed for wheezing or shortness of breath.     allopurinol  (ZYLOPRIM ) 300 MG tablet Take 300 mg by mouth daily.     ALPRAZolam  (XANAX ) 1 MG tablet Take 1 mg by mouth at bedtime as needed for anxiety or sleep.     aspirin  EC 81 MG tablet Take 81 mg by mouth daily. Swallow whole.     celecoxib (CELEBREX) 200 MG capsule      cholecalciferol (VITAMIN D3) 25 MCG (1000 UNIT) tablet Take 1,000 Units by mouth daily.     ezetimibe  (ZETIA ) 10 MG tablet Take 1 tablet (10 mg total) by mouth daily. 90 tablet 3   finasteride  (PROSCAR ) 5 MG tablet Take 1.25 mg by mouth daily.     HYDROcodone -acetaminophen  (NORCO/VICODIN) 5-325 MG tablet Take 1 tablet by mouth every 6 (six) hours as needed for severe pain.     ibuprofen (ADVIL) 200 MG tablet Take 600 mg  by mouth daily as needed for moderate pain (pain score 4-6).     losartan  (COZAAR ) 50 MG tablet Take 1 tablet (50 mg total) by mouth daily. 90 tablet 3   metaxalone (SKELAXIN) 800 MG tablet Take 400 mg by mouth 3 (three) times daily as needed for muscle spasms.     metoprolol  succinate (TOPROL  XL) 25 MG 24 hr tablet Take 1 tablet (25 mg total) by mouth daily. 90 tablet 3   nitroGLYCERIN  (NITROSTAT ) 0.4 MG SL tablet Place 1 tablet (0.4 mg total) under the tongue every 5 (five) minutes as needed for chest pain. 90 tablet 3   pantoprazole  (PROTONIX ) 40 MG tablet Take 1 tablet (40 mg total) by mouth 2 (two) times daily. 180 tablet 3   polyethylene glycol (MIRALAX / GLYCOLAX) 17 g packet Take 17 g by mouth daily as needed for moderate constipation.     rosuvastatin  (CRESTOR ) 40 MG tablet Take 1 tablet (40 mg total) by mouth daily. 90 tablet 3   Tetrahydrozoline HCl (REDNESS RELIEVER EYE DROPS OP) Place 1 drop into both eyes daily as needed (redness).     traMADol  (ULTRAM ) 50 MG tablet Take 1 tablet (50 mg total) by mouth every 6 (six) hours as needed. 20 tablet 0   TRELEGY ELLIPTA 100-62.5-25 MCG/ACT AEPB Inhale 1 puff into the lungs daily.     No current facility-administered medications for this visit.     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, follwed by [facility]   Dyspnea    with exertion   GERD (gastroesophageal reflux disease)    Hyperlipidemia    Hypertension    Pneumonia     Past Surgical History:  Procedure Laterality Date   CATARACT EXTRACTION Bilateral    LEFT HEART CATH AND CORONARY ANGIOGRAPHY N/A 06/05/2022   Procedure: LEFT HEART CATH AND CORONARY ANGIOGRAPHY;  Surgeon: Claudene Victory ORN, MD;  [facility];  Service: Cardiovascular;  Laterality: N/A;   LYMPHADENECTOMY Bilateral  04/29/2023   Procedure: BILATERAL PELVIC LYMPHADENECTOMY;  Surgeon: Renda Glance, MD;  Location: [facility];  Service: Urology;  Laterality: Bilateral;   PROSTATE BIOPSY     ROBOT ASSISTED LAPAROSCOPIC RADICAL PROSTATECTOMY N/A 04/29/2023   Procedure: XI ROBOTIC ASSISTED LAPAROSCOPIC RADICAL PROSTATECTOMY LEVEL 2;  Surgeon: Renda Glance, MD;  Location: [facility];  Service: Urology;  Laterality: N/A;  210 MINUTES NEEDED FOR CASE   UPPER GASTROINTESTINAL ENDOSCOPY     VENTRAL HERNIA REPAIR N/A 10/05/2023   Procedure: LAPAROSCOPIC VENTRAL HERNIA REPAAIR WITH MESH;  Surgeon: Rubin Calamity, MD;  Location: [facility] OR;  Service: General;  Laterality: N/A;    Social History   Socioeconomic History   Marital status: Married    Spouse name: Not on file   Number of children: 1   Years of education: Not on file   Highest education level: Not on file  Occupational History   Not on file  Tobacco Use   Smoking status: Never   Smokeless tobacco: Never  Vaping Use   Vaping status: Never Used  Substance and Sexual Activity   Alcohol use: Yes    Alcohol/week: 14.0 standard drinks of alcohol    Types: 14 Standard drinks or equivalent per week    Comment: 2 drinks per day   Drug use: Never   Sexual activity: Not on file  Other Topics Concern   Not on file  Social History Narrative   Not on file   Social Drivers of Health   Financial Resource Strain: Not on file  Food Insecurity: No Food Insecurity (04/29/2023)   Hunger Vital Sign    Worried About Running Out of Food in the Last Year: Never true    Ran Out of Food in the Last Year: Never true  Transportation Needs: No Transportation Needs (04/29/2023)   PRAPARE - Administrator, Civil Service (Medical): No    Lack of Transportation (Non-Medical): No  Physical Activity: Not on file  Stress: Not on file  Social Connections: Not on file  Intimate Partner Violence: Not At Risk (04/29/2023)   Humiliation, Afraid, Rape, and Kick questionnaire     Fear of Current or Ex-Partner: No    Emotionally Abused: No    Physically Abused: No    Sexually Abused: No    Family History  Problem Relation Age of Onset   Coronary artery disease Father    Prostate cancer Father 35 - 5   Leukemia Father 67   Coronary artery disease Brother    Prostate cancer Brother 19 - 59   Colon cancer Brother 37   Pancreatic cancer Cousin        maternal first cousin   Breast cancer Daughter 44  negative genetic testing   Esophageal cancer Neg Hx    Rectal cancer Neg Hx    Stomach cancer Neg Hx     ROS: no fevers or chills, productive cough, hemoptysis, dysphasia, odynophagia, melena, hematochezia, dysuria, hematuria, rash, seizure activity, orthopnea, PND, pedal edema, claudication. Remaining systems are negative.  Physical Exam: Well-developed well-nourished in no acute distress.  Skin is warm and dry.  HEENT is normal.  Neck is supple.  Chest is clear to auscultation with normal expansion.  Cardiovascular exam is regular rate and rhythm.  Abdominal exam nontender or distended. No masses palpated. Extremities show no edema. neuro grossly intact  EKG Interpretation Date/Time:  Thursday May 04 2024 13:09:43 EDT Ventricular Rate:  42 PR Interval:  268 QRS Duration:  148 QT Interval:  472 QTC Calculation: 394 R Axis:   32  Text Interpretation: Marked sinus bradycardia with 1st degree A-V block with Premature atrial complexes Right bundle branch block Confirmed by Pietro Rogue ([id number]) on 05/04/2024 1:11:04 PM  Heart rate is decreased compared to previous.  A/P  1 coronary artery disease-patient denies chest pain.  Continue medical therapy with aspirin , statin; discontinue beta-blocker in the setting of bradycardia.  2 hypertension-patient's blood pressure is controlled.  However heart rate is slow.  Discontinue Toprol  and follow.  Continue losartan .  Check potassium and renal function.  3 hyperlipidemia-continue statin.  Check  lipids and liver.  4 lung nodule-patient will need follow-up chest CT July 2026.  Rogue Pietro, MD

## 2024-04-27 ENCOUNTER — Encounter: Payer: Self-pay | Admitting: Family Medicine

## 2024-05-02 ENCOUNTER — Encounter: Payer: Self-pay | Admitting: Family Medicine

## 2024-05-04 ENCOUNTER — Encounter: Payer: Self-pay | Admitting: Cardiology

## 2024-05-04 ENCOUNTER — Ambulatory Visit: Attending: Cardiology | Admitting: Cardiology

## 2024-05-04 VITALS — BP 112/54 | HR 42 | Ht 69.0 in | Wt 185.0 lb

## 2024-05-04 DIAGNOSIS — E785 Hyperlipidemia, unspecified: Secondary | ICD-10-CM

## 2024-05-04 DIAGNOSIS — I2510 Atherosclerotic heart disease of native coronary artery without angina pectoris: Secondary | ICD-10-CM

## 2024-05-04 DIAGNOSIS — R911 Solitary pulmonary nodule: Secondary | ICD-10-CM

## 2024-05-04 DIAGNOSIS — I10 Essential (primary) hypertension: Secondary | ICD-10-CM | POA: Diagnosis not present

## 2024-05-04 NOTE — Patient Instructions (Signed)
 Medication Instructions:   STOP METOPROLOL  *If you need a refill on your cardiac medications before your next appointment, please call your pharmacy*  Lab Work:  Your physician recommends that you return for lab work FASTING  If you have labs (blood work) drawn today and your tests are completely normal, you will receive your results only by: MyChart Message (if you have MyChart) OR A paper copy in the mail If you have any lab test that is abnormal or we need to change your treatment, we will call you to review the results.   Follow-Up: At [facility], you and your health needs are our priority.  As part of our continuing mission to provide you with exceptional heart care, our providers are all part of one team.  This team includes your primary Cardiologist (physician) and Advanced Practice Providers or APPs (Physician Assistants and Nurse Practitioners) who all work together to provide you with the care you need, when you need it.  Your next appointment:   12 month(s)  Provider:   Redell Shallow, MD

## 2024-05-05 ENCOUNTER — Other Ambulatory Visit

## 2024-05-09 ENCOUNTER — Ambulatory Visit
Admission: RE | Admit: 2024-05-09 | Discharge: 2024-05-09 | Disposition: A | Discharge status: Home / Self Care | Source: Ambulatory Visit | Attending: Family Medicine | Admitting: Family Medicine

## 2024-05-09 DIAGNOSIS — I10 Essential (primary) hypertension: Secondary | ICD-10-CM | POA: Diagnosis not present

## 2024-05-09 DIAGNOSIS — M47812 Spondylosis without myelopathy or radiculopathy, cervical region: Secondary | ICD-10-CM | POA: Diagnosis not present

## 2024-05-09 DIAGNOSIS — M4802 Spinal stenosis, cervical region: Secondary | ICD-10-CM

## 2024-05-09 DIAGNOSIS — E785 Hyperlipidemia, unspecified: Secondary | ICD-10-CM | POA: Diagnosis not present

## 2024-05-09 DIAGNOSIS — C102 Malignant neoplasm of lateral wall of oropharynx: Secondary | ICD-10-CM

## 2024-05-09 MED ORDER — GADOPICLENOL 0.5 MMOL/ML IV SOLN
8.0000 mL | Freq: Once | INTRAVENOUS | Status: AC | PRN
  Administered 2024-05-09: 8 mL via INTRAVENOUS

## 2024-05-10 ENCOUNTER — Ambulatory Visit: Payer: Self-pay | Admitting: Cardiology

## 2024-05-10 LAB — COMPREHENSIVE METABOLIC PANEL WITH GFR
ALT: 20 IU/L (ref 0–44)
AST: 35 IU/L (ref 0–40)
Albumin: 4.3 g/dL (ref 3.8–4.8)
Alkaline Phosphatase: 69 IU/L (ref 44–121)
BUN/Creatinine Ratio: 12 (ref 10–24)
BUN: 17 mg/dL (ref 8–27)
Bilirubin Total: 0.5 mg/dL (ref 0.0–1.2)
CO2: 22 mmol/L (ref 20–29)
Calcium: 9.6 mg/dL (ref 8.6–10.2)
Chloride: 99 mmol/L (ref 96–106)
Creatinine, Ser: 1.43 mg/dL — ABNORMAL HIGH (ref 0.76–1.27)
Globulin, Total: 2.3 g/dL (ref 1.5–4.5)
Glucose: 83 mg/dL (ref 70–99)
Potassium: 4.5 mmol/L (ref 3.5–5.2)
Sodium: 138 mmol/L (ref 134–144)
Total Protein: 6.6 g/dL (ref 6.0–8.5)
eGFR: 51 mL/min/1.73 — ABNORMAL LOW (ref >59)

## 2024-05-10 LAB — LIPID PANEL
Chol/HDL Ratio: 1.5 ratio (ref 0.0–5.0)
Cholesterol, Total: 144 mg/dL (ref 100–199)
HDL: 96 mg/dL (ref >39)
LDL Chol Calc (NIH): 36 mg/dL (ref 0–99)
Triglycerides: 54 mg/dL (ref 0–149)
VLDL Cholesterol Cal: 12 mg/dL (ref 5–40)

## 2024-05-15 DIAGNOSIS — C099 Malignant neoplasm of tonsil, unspecified: Secondary | ICD-10-CM | POA: Diagnosis not present

## 2024-05-15 DIAGNOSIS — J351 Hypertrophy of tonsils: Secondary | ICD-10-CM | POA: Diagnosis not present

## 2024-05-15 DIAGNOSIS — B977 Papillomavirus as the cause of diseases classified elsewhere: Secondary | ICD-10-CM | POA: Diagnosis not present

## 2024-05-15 DIAGNOSIS — C029 Malignant neoplasm of tongue, unspecified: Secondary | ICD-10-CM | POA: Diagnosis not present

## 2024-05-15 DIAGNOSIS — R221 Localized swelling, mass and lump, neck: Secondary | ICD-10-CM | POA: Diagnosis not present

## 2024-05-15 DIAGNOSIS — J359 Chronic disease of tonsils and adenoids, unspecified: Secondary | ICD-10-CM | POA: Diagnosis not present

## 2024-05-15 DIAGNOSIS — R590 Localized enlarged lymph nodes: Secondary | ICD-10-CM | POA: Diagnosis not present

## 2024-05-17 ENCOUNTER — Other Ambulatory Visit: Payer: Self-pay | Admitting: Adult Health

## 2024-05-17 DIAGNOSIS — E785 Hyperlipidemia, unspecified: Secondary | ICD-10-CM

## 2024-05-19 ENCOUNTER — Other Ambulatory Visit: Payer: Self-pay

## 2024-05-19 ENCOUNTER — Inpatient Hospital Stay
Admission: RE | Admit: 2024-05-19 | Discharge: 2024-05-19 | Disposition: A | Discharge status: Home / Self Care | Payer: Self-pay | Source: Ambulatory Visit | Attending: Radiation Oncology | Admitting: Radiation Oncology

## 2024-05-19 ENCOUNTER — Other Ambulatory Visit: Payer: Self-pay | Admitting: Cardiology

## 2024-05-19 DIAGNOSIS — C090 Malignant neoplasm of tonsillar fossa: Secondary | ICD-10-CM

## 2024-05-19 NOTE — Progress Notes (Signed)
Store image request

## 2024-05-25 ENCOUNTER — Telehealth: Payer: Self-pay | Admitting: Radiation Oncology

## 2024-05-25 NOTE — Telephone Encounter (Signed)
LVM to schedule CON with Dr.Squire

## 2024-05-29 NOTE — Progress Notes (Signed)
 Oncology Nurse Navigator Documentation   I left a VM with Gilbert Thompson to introduce myself as the head and neck navigator who will be involved in his care here at the cancer center. I left information about his upcoming appointments next week and also provided him with my direct contact information asking for a return call when he was able.   Delon Jefferson RN, BSN, OCN Head & Neck Oncology Nurse Navigator [facility] at [facility] Phone # [phone]  Fax # [phone]

## 2024-05-30 NOTE — Progress Notes (Signed)
 SABRA

## 2024-06-06 NOTE — Progress Notes (Signed)
 Oncology Nurse Navigator Documentation   Gilbert Thompson returned my previous call made on 05/29/24. I Introduced myself as the H&N oncology nurse navigator that works with Dr. Izell and Dr. Autumn to whom he has been referred by Dr. Lauralee. He confirmed understanding of referral. Briefly explained my role as his navigator, provided my contact information.  Confirmed understanding of upcoming appts and [facility] location, explained arrival and registration process. I explained the purpose of a dental evaluation prior to starting RT, indicated he would be contacted by Dr. Nila office at [facility].  I encouraged him to call with questions/concerns as he moves forward with appts and procedures.   He verbalized understanding of information provided, expressed appreciation for my call.   Navigator Initial Assessment Employment Status: he is retired Currently on [employer] / STD: no Living Situation: he lives with his wife.  Support System: wife, family PCP:  PCD: [facility] [employer] Concerns: no Transportation Needs: no Sensory Deficits: no Chiropodist Needed:  no Ambulation Needs: no Psychosocial Needs:  no Concerns/Needs Understanding Cancer:  addressed/answered by navigator to best of ability Self-Expressed Needs: no   Gilbert Jefferson RN, BSN, OCN Head & Neck Oncology Nurse Navigator [facility] at [facility] Phone # [phone]  Fax # [phone]

## 2024-06-06 NOTE — Progress Notes (Signed)
 Radiation Oncology         [phone]) [phone] ________________________________  Initial outpatient Consultation  Name: Gilbert Thompson MRN: [id number]  Date: 06/07/2024  DOB: September 10, 1951  RR:Aonfhmzw, Maude, MD  Lauralee Chew, MD   REFERRING PHYSICIAN: Lauralee Chew, MD  DIAGNOSIS: No diagnosis found.   Cancer Staging  Malignant neoplasm of prostate [facility]) Staging form: Prostate, AJCC 8th Edition - Clinical stage from 01/19/2023: Stage IIC (cT2a, cN0, cM0, PSA: 6, Grade Group: 4) - Signed by Sherwood Rise, PA-C on 03/30/2023 Histopathologic type: Adenocarcinoma, NOS Stage prefix: Initial diagnosis Prostate specific antigen (PSA) range: Less than 10 Gleason primary pattern: 4 Gleason secondary pattern: 4 Gleason score: 8 Histologic grading system: 5 grade system Number of biopsy cores examined: 14 Number of biopsy cores positive: 5 Location of positive needle core biopsies: One side   CHIEF COMPLAINT: Here to discuss management of head/neck cancer  HISTORY OF PRESENT ILLNESS::Gilbert Thompson is a 73 y.o. male who presented to his PCP with complains of chronic pharyngitis and a left submandibular mass that has persisted over the past year.  To further investigate his symptoms, he underwent a CT neck and CT chest on 04/06/24 showing a 3.0 x 2.4 x 2.5 cm soft tissue mass centered in the left oropharynx at the glossotonsillar sulcus with possible involvement of the adjacent tongue base. Scans also noted scattered tiny pulmonary nodules are unchanged and likely benign but with only 5 months of documented stability but with recommend attention on follow-up given his history of prostate cancer.   Subsequently, the patient was referred to Dr. Graig on 04/24/24 who performed a FNA biopsy of left neck. However, biopsy was inconclusive. As a result, he then underwent a laryngoscopy with biopsy on 05/15/24 under the care of Dr. Lauralee. 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 Tumor is moderately differentiated with foci of poor differentiation and invasion into the striated muscle. Immunohistochemical studies are p16, p40, and HRHPV are positive.   Pertinent imaging thus far includes cervical spine MRI performed on 05/09/24 revealing no evidence of metastatic disease to the cervical spine itself but did indicated degenerative spondylosis at C4-5 and C5-6. 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 PET scan performed on 04/26/24 showing FDG avid mass along the left tongue base/oropharynx with left cervical chain nodal metastatic disease with no suspicious pulmonary or mediastinal FDG uptake    Swallowing issues, if any: ***  Weight Changes: ***  Pain status: ***  Other symptoms: chronic hoarseness   Tobacco history, if any: non smoker   ETOH abuse, if any: 2-3 alcoholic drinks nightly.   Prior cancers, if any: history of prostate cancer   PREVIOUS RADIATION THERAPY: No  PAST MEDICAL HISTORY:  has a past medical history of Asthma, Cancer (HCC), Coronary artery disease, Dysphonia, Dyspnea, GERD (gastroesophageal reflux disease), Hyperlipidemia, Hypertension, and Pneumonia.    PAST SURGICAL HISTORY: Past Surgical History:  Procedure Laterality Date   CATARACT EXTRACTION Bilateral    LEFT HEART CATH AND CORONARY ANGIOGRAPHY N/A 06/05/2022   Procedure: LEFT HEART CATH AND CORONARY ANGIOGRAPHY;  Surgeon: Claudene Victory ORN, MD;  Location: [facility];  Service: Cardiovascular;  Laterality: N/A;   LYMPHADENECTOMY Bilateral 04/29/2023   Procedure: BILATERAL PELVIC LYMPHADENECTOMY;  Surgeon: Renda Glance, MD;  Location: [facility];  Service: Urology;  Laterality: Bilateral;   PROSTATE BIOPSY     ROBOT ASSISTED LAPAROSCOPIC RADICAL PROSTATECTOMY N/A 04/29/2023   Procedure:  XI ROBOTIC ASSISTED LAPAROSCOPIC RADICAL PROSTATECTOMY LEVEL 2;  Surgeon: Renda Glance, MD;  Location: [facility];  Service:  Urology;  Laterality: N/A;  210 MINUTES NEEDED FOR CASE   UPPER GASTROINTESTINAL ENDOSCOPY     VENTRAL HERNIA REPAIR N/A 10/05/2023   Procedure: LAPAROSCOPIC VENTRAL HERNIA REPAAIR WITH MESH;  Surgeon: Rubin Calamity, MD;  Location: [facility] OR;  Service: General;  Laterality: N/A;    FAMILY HISTORY: family history includes Breast cancer (age of onset: 70) in his daughter; Colon cancer (age of onset: 37) in his brother; Coronary artery disease in his brother and father; Leukemia (age of onset: 46) in his father; Pancreatic cancer in his cousin; Prostate cancer (age of onset: 63 - 31) in his brother; Prostate cancer (age of onset: 9 - 67) in his father.  SOCIAL HISTORY:  reports that he has never smoked. He has never used smokeless tobacco. He reports current alcohol use of about 14.0 standard drinks of alcohol per week. He reports that he does not use drugs.  ALLERGIES: Patient has no known allergies.  MEDICATIONS:  Current Outpatient Medications  Medication Sig Dispense Refill   albuterol  (VENTOLIN  HFA) 108 (90 Base) MCG/ACT inhaler Inhale 1-2 puffs into the lungs every 6 (six) hours as needed for wheezing or shortness of breath.     allopurinol  (ZYLOPRIM ) 300 MG tablet Take 300 mg by mouth daily.     ALPRAZolam  (XANAX ) 1 MG tablet Take 1 mg by mouth at bedtime as needed for anxiety or sleep.     aspirin  EC 81 MG tablet Take 81 mg by mouth daily. Swallow whole.     celecoxib (CELEBREX) 200 MG capsule      cholecalciferol (VITAMIN D3) 25 MCG (1000 UNIT) tablet Take 1,000 Units by mouth daily.     ezetimibe  (ZETIA ) 10 MG tablet TAKE 1 TABLET BY MOUTH DAILY 90 tablet 3   finasteride  (PROSCAR ) 5 MG tablet Take 1.25 mg by mouth daily.     HYDROcodone -acetaminophen  (NORCO/VICODIN) 5-325 MG tablet Take 1 tablet by mouth every 6 (six) hours as needed for severe pain.     ibuprofen (ADVIL) 200 MG tablet Take 600 mg by mouth daily as needed for moderate pain (pain score 4-6).     losartan  (COZAAR ) 50 MG  tablet Take 1 tablet (50 mg total) by mouth daily. 90 tablet 3   metaxalone (SKELAXIN) 800 MG tablet Take 400 mg by mouth 3 (three) times daily as needed for muscle spasms.     nitroGLYCERIN  (NITROSTAT ) 0.4 MG SL tablet Place 1 tablet (0.4 mg total) under the tongue every 5 (five) minutes as needed for chest pain. 90 tablet 3   pantoprazole  (PROTONIX ) 40 MG tablet Take 1 tablet (40 mg total) by mouth 2 (two) times daily. 180 tablet 3   polyethylene glycol (MIRALAX / GLYCOLAX) 17 g packet Take 17 g by mouth daily as needed for moderate constipation.     rosuvastatin  (CRESTOR ) 40 MG tablet Take 1 tablet (40 mg total) by mouth daily. 90 tablet 3   Tetrahydrozoline HCl (REDNESS RELIEVER EYE DROPS OP) Place 1 drop into both eyes daily as needed (redness).     traMADol  (ULTRAM ) 50 MG tablet Take 1 tablet (50 mg total) by mouth every 6 (six) hours as needed. 20 tablet 0   TRELEGY ELLIPTA 100-62.5-25 MCG/ACT AEPB Inhale 1 puff into the lungs daily.     No current facility-administered medications for this visit.    REVIEW OF SYSTEMS:  Notable for that above.  PHYSICAL EXAM:  vitals were not taken for this visit.   General: Alert and oriented, in no acute distress HEENT: Head is normocephalic. Extraocular movements are intact. Oropharynx is notable for ***. Neck: Neck is notable for *** Heart: Regular in rate and rhythm with no murmurs, rubs, or gallops. Chest: Clear to auscultation bilaterally, with no rhonchi, wheezes, or rales. Abdomen: Soft, nontender, nondistended, with no rigidity or guarding. Extremities: No cyanosis or edema. Lymphatics: see Neck Exam Skin: No concerning lesions. Musculoskeletal: symmetric strength and muscle tone throughout. Neurologic: Cranial nerves II through XII are grossly intact. No obvious focalities. Speech is fluent. Coordination is intact. Psychiatric: Judgment and insight are intact. Affect is appropriate.   ECOG = ***  0 - Asymptomatic (Fully active, able  to carry on all predisease activities without restriction)  1 - Symptomatic but completely ambulatory (Restricted in physically strenuous activity but ambulatory and able to carry out work of a light or sedentary nature. For example, light housework, office work)  2 - Symptomatic, <50% in bed during the day (Ambulatory and capable of all self care but unable to carry out any work activities. Up and about more than 50% of waking hours)  3 - Symptomatic, >50% in bed, but not bedbound (Capable of only limited self-care, confined to bed or chair 50% or more of waking hours)  4 - Bedbound (Completely disabled. Cannot carry on any self-care. Totally confined to bed or chair)  5 - Death   Raylene MM, Creech RH, Tormey DC, et al. [phone]). Toxicity and response criteria of the [facility]. Am. DOROTHA Bridges. Oncol. 5 (6): 649-55   LABORATORY DATA:  Lab Results  Component Value Date   WBC 11.7 (H) 10/01/2023   HGB 13.6 10/01/2023   HCT 39.9 10/01/2023   MCV 91.3 10/01/2023   PLT 296 10/01/2023   CMP     Component Value Date/Time   NA 138 05/09/2024 1128   K 4.5 05/09/2024 1128   CL 99 05/09/2024 1128   CO2 22 05/09/2024 1128   GLUCOSE 83 05/09/2024 1128   GLUCOSE 85 10/01/2023 1016   BUN 17 05/09/2024 1128   CREATININE 1.43 (H) 05/09/2024 1128   CALCIUM  9.6 05/09/2024 1128   PROT 6.6 05/09/2024 1128   ALBUMIN 4.3 05/09/2024 1128   AST 35 05/09/2024 1128   ALT 20 05/09/2024 1128   ALKPHOS 69 05/09/2024 1128   BILITOT 0.5 05/09/2024 1128   GFRNONAA >60 10/01/2023 1016      Lab Results  Component Value Date   TSH 2.03 08/03/2022     RADIOGRAPHY: MR CERVICAL SPINE W WO CONTRAST Result Date: 05/09/2024 CLINICAL DATA:  Cervical spine stenosis. Malignant neoplasm of the oropharynx. EXAM: MRI CERVICAL SPINE WITHOUT AND WITH CONTRAST TECHNIQUE: Multiplanar and multiecho pulse sequences of the cervical spine, to include the craniocervical junction and cervicothoracic  junction, were obtained without and with intravenous contrast. CONTRAST:  8 cc Vueway  COMPARISON:  CT neck 04/06/2024.  MRI 08/21/2013. FINDINGS: Alignment: Normal Vertebrae: No focal bone lesion. No evidence of fracture, edematous endplate changes or edematous facet arthritis. No sign of bony metastatic disease. Cord: No primary cord lesion.  See below regarding stenosis. Posterior Fossa, vertebral arteries, paraspinal tissues: The study is not primarily a soft tissue examination. We can see the tumor apparently involving the left sternocleidomastoid muscle consistent with metastatic disease, as described by CT. The abnormal left level 2 lymph node is also visible. Disc levels: The foramen magnum is widely patent. There  is ordinary mild osteoarthritis of the C1-2 articulation but no encroachment upon the neural structures. C2-3: Minimal facet arthritis.  No stenosis. C3-4: Uncovertebral hypertrophy and bulging of the disc. Mild foraminal narrowing right more than left. C4-5: Spondylosis with endplate osteophytes and bulging of the disc. Uncovertebral hypertrophy. Mild facet osteoarthritis. Canal narrowing with effacement of the subarachnoid space surrounding the cord. AP diameter of the canal 8 mm. No frank cord compression. Moderate bilateral foraminal stenosis. C5-6: Endplate osteophytes and shallow protrusion of the disc. Narrowing of the canal with AP diameter in the midline 6.9 mm. Effacement of the subarachnoid space and some deformity of the cord. This could be significant spinal stenosis. Bilateral foraminal stenosis could affect either C6 nerve. C6-7: Mild facet osteoarthritis.  No canal or foraminal stenosis. C7-T1: Chronic facet osteoarthritis with apparent ankylosis. No canal or foraminal stenosis. IMPRESSION: 1. No evidence of metastatic disease to the cervical spine itself. 2. Degenerative spondylosis at C4-5 and C5-6. 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 Effacement of the  subarachnoid space and some deformity of the cord. No abnormal cord T2 signal. Bilateral foraminal stenosis at C4-5 and C5-6 that could affect either or both C5 and C6 nerves. This is more severe at C5-6. 3. The study is not primarily a soft tissue examination. We can see the tumor apparently involving the left sternocleidomastoid muscle consistent with metastatic disease, as described by CT. The abnormal left level 2 lymph node is also visible. Electronically Signed   By: Oneil Officer M.D.   On: 05/09/2024 10:30      IMPRESSION/PLAN:  This is a delightful patient with head and neck cancer. I *** recommend radiotherapy for this patient.  We discussed the potential risks, benefits, and side effects of radiotherapy. We talked in detail about acute and late effects. We discussed that some of the most bothersome acute effects may be mucositis, dysgeusia, salivary changes, skin irritation, hair loss, dehydration, weight loss and fatigue. We talked about late effects which include but are not necessarily limited to dysphagia, hypothyroidism, nerve injury, vascular injury, spinal cord injury, xerostomia, trismus, neck edema, dental issues, non-healing wound, and potentially fatal injury to any of the tissues in the head and neck region. No guarantees of treatment were given. A consent form was signed and placed in the patient's medical record. The patient is enthusiastic about proceeding with treatment. I look forward to participating in the patient's care.    Simulation (treatment planning) will take place ***  We also discussed that the treatment of head and neck cancer is a multidisciplinary process to maximize treatment outcomes and quality of life. For this reason the following referrals have been or will be made:  *** Medical oncology to discuss chemotherapy   *** Dentistry for dental evaluation, possible extractions in the radiation fields, and /or advice on reducing risk of cavities,  osteoradionecrosis, or other oral issues.  *** Nutritionist for nutrition support during and after treatment.  *** Speech language pathology for swallowing and/or speech therapy.  *** Social work for social support.   *** Physical therapy due to risk of lymphedema in neck and deconditioning.  *** Baseline labs including TSH.  On date of service, in total, I spent *** minutes on this encounter. Patient was seen in person.  __________________________________________   Lauraine Golden, MD  This document serves as a record of services personally performed by Lauraine Golden, MD. It was created on her behalf by Reymundo Cartwright, a trained medical  scribe. The creation of this record is based on the scribe's personal observations and the provider's statements to them. This document has been checked and approved by the attending provider.

## 2024-06-07 ENCOUNTER — Encounter: Payer: Self-pay | Admitting: Radiation Oncology

## 2024-06-07 ENCOUNTER — Ambulatory Visit
Admission: RE | Admit: 2024-06-07 | Discharge: 2024-06-07 | Disposition: A | Discharge status: Home / Self Care | Source: Ambulatory Visit | Attending: Radiation Oncology | Admitting: Radiation Oncology

## 2024-06-07 ENCOUNTER — Inpatient Hospital Stay: Attending: Oncology | Admitting: Oncology

## 2024-06-07 ENCOUNTER — Inpatient Hospital Stay

## 2024-06-07 VITALS — BP 139/71 | HR 107 | Temp 97.5°F | Resp 18 | Ht 69.0 in | Wt 184.4 lb

## 2024-06-07 VITALS — BP 120/56 | HR 90 | Temp 98.1°F | Resp 17 | Wt 185.1 lb

## 2024-06-07 DIAGNOSIS — G893 Neoplasm related pain (acute) (chronic): Secondary | ICD-10-CM | POA: Insufficient documentation

## 2024-06-07 DIAGNOSIS — C01 Malignant neoplasm of base of tongue: Secondary | ICD-10-CM | POA: Diagnosis not present

## 2024-06-07 DIAGNOSIS — M47812 Spondylosis without myelopathy or radiculopathy, cervical region: Secondary | ICD-10-CM | POA: Insufficient documentation

## 2024-06-07 DIAGNOSIS — Z800 Family history of malignant neoplasm of digestive organs: Secondary | ICD-10-CM | POA: Insufficient documentation

## 2024-06-07 DIAGNOSIS — Z79899 Other long term (current) drug therapy: Secondary | ICD-10-CM | POA: Insufficient documentation

## 2024-06-07 DIAGNOSIS — Z8701 Personal history of pneumonia (recurrent): Secondary | ICD-10-CM | POA: Insufficient documentation

## 2024-06-07 DIAGNOSIS — M47811 Spondylosis without myelopathy or radiculopathy, occipito-atlanto-axial region: Secondary | ICD-10-CM | POA: Insufficient documentation

## 2024-06-07 DIAGNOSIS — Z7982 Long term (current) use of aspirin: Secondary | ICD-10-CM | POA: Diagnosis not present

## 2024-06-07 DIAGNOSIS — R918 Other nonspecific abnormal finding of lung field: Secondary | ICD-10-CM | POA: Insufficient documentation

## 2024-06-07 DIAGNOSIS — Z806 Family history of leukemia: Secondary | ICD-10-CM | POA: Diagnosis not present

## 2024-06-07 DIAGNOSIS — C109 Malignant neoplasm of oropharynx, unspecified: Secondary | ICD-10-CM | POA: Insufficient documentation

## 2024-06-07 DIAGNOSIS — C61 Malignant neoplasm of prostate: Secondary | ICD-10-CM | POA: Insufficient documentation

## 2024-06-07 DIAGNOSIS — Z803 Family history of malignant neoplasm of breast: Secondary | ICD-10-CM | POA: Diagnosis not present

## 2024-06-07 DIAGNOSIS — M2578 Osteophyte, vertebrae: Secondary | ICD-10-CM | POA: Insufficient documentation

## 2024-06-07 DIAGNOSIS — I2510 Atherosclerotic heart disease of native coronary artery without angina pectoris: Secondary | ICD-10-CM | POA: Insufficient documentation

## 2024-06-07 DIAGNOSIS — M50222 Other cervical disc displacement at C5-C6 level: Secondary | ICD-10-CM | POA: Insufficient documentation

## 2024-06-07 DIAGNOSIS — Z8042 Family history of malignant neoplasm of prostate: Secondary | ICD-10-CM | POA: Insufficient documentation

## 2024-06-07 DIAGNOSIS — K219 Gastro-esophageal reflux disease without esophagitis: Secondary | ICD-10-CM | POA: Diagnosis not present

## 2024-06-07 DIAGNOSIS — R1310 Dysphagia, unspecified: Secondary | ICD-10-CM | POA: Diagnosis not present

## 2024-06-07 DIAGNOSIS — M4802 Spinal stenosis, cervical region: Secondary | ICD-10-CM | POA: Diagnosis not present

## 2024-06-07 DIAGNOSIS — Z8546 Personal history of malignant neoplasm of prostate: Secondary | ICD-10-CM | POA: Diagnosis not present

## 2024-06-07 DIAGNOSIS — J45909 Unspecified asthma, uncomplicated: Secondary | ICD-10-CM | POA: Diagnosis not present

## 2024-06-07 DIAGNOSIS — I10 Essential (primary) hypertension: Secondary | ICD-10-CM | POA: Insufficient documentation

## 2024-06-07 DIAGNOSIS — E785 Hyperlipidemia, unspecified: Secondary | ICD-10-CM | POA: Insufficient documentation

## 2024-06-07 DIAGNOSIS — C770 Secondary and unspecified malignant neoplasm of lymph nodes of head, face and neck: Secondary | ICD-10-CM | POA: Insufficient documentation

## 2024-06-07 DIAGNOSIS — C090 Malignant neoplasm of tonsillar fossa: Secondary | ICD-10-CM | POA: Diagnosis not present

## 2024-06-07 LAB — CBC WITH DIFFERENTIAL (CANCER CENTER ONLY)
Abs Immature Granulocytes: 0.02 K/uL (ref 0.00–0.07)
Basophils Absolute: 0.1 K/uL (ref 0.0–0.1)
Basophils Relative: 1 %
Eosinophils Absolute: 0 K/uL (ref 0.0–0.5)
Eosinophils Relative: 0 %
HCT: 41.5 % (ref 39.0–52.0)
Hemoglobin: 14.7 g/dL (ref 13.0–17.0)
Immature Granulocytes: 0 %
Lymphocytes Relative: 10 %
Lymphs Abs: 0.7 K/uL (ref 0.7–4.0)
MCH: 32.3 pg (ref 26.0–34.0)
MCHC: 35.4 g/dL (ref 30.0–36.0)
MCV: 91.2 fL (ref 80.0–100.0)
Monocytes Absolute: 0.7 K/uL (ref 0.1–1.0)
Monocytes Relative: 9 %
Neutro Abs: 5.8 K/uL (ref 1.7–7.7)
Neutrophils Relative %: 80 %
Platelet Count: 271 K/uL (ref 150–400)
RBC: 4.55 MIL/uL (ref 4.22–5.81)
RDW: 13.3 % (ref 11.5–15.5)
WBC Count: 7.3 K/uL (ref 4.0–10.5)
nRBC: 0 % (ref 0.0–0.2)

## 2024-06-07 LAB — CMP (CANCER CENTER ONLY)
ALT: 17 U/L (ref 0–44)
AST: 26 U/L (ref 15–41)
Albumin: 4.3 g/dL (ref 3.5–5.0)
Alkaline Phosphatase: 61 U/L (ref 38–126)
Anion gap: 6 (ref 5–15)
BUN: 21 mg/dL (ref 8–23)
CO2: 28 mmol/L (ref 22–32)
Calcium: 9.7 mg/dL (ref 8.9–10.3)
Chloride: 102 mmol/L (ref 98–111)
Creatinine: 1.48 mg/dL — ABNORMAL HIGH (ref 0.61–1.24)
GFR, Estimated: 49 mL/min — ABNORMAL LOW (ref >60)
Glucose, Bld: 108 mg/dL — ABNORMAL HIGH (ref 70–99)
Potassium: 4 mmol/L (ref 3.5–5.1)
Sodium: 136 mmol/L (ref 135–145)
Total Bilirubin: 0.6 mg/dL (ref 0.0–1.2)
Total Protein: 7.3 g/dL (ref 6.5–8.1)

## 2024-06-07 LAB — TSH: TSH: 1.85 u[IU]/mL (ref 0.350–4.500)

## 2024-06-07 MED ORDER — LIDOCAINE-PRILOCAINE 2.5-2.5 % EX CREA: TOPICAL_CREAM | CUTANEOUS | 3 refills | Status: DC

## 2024-06-07 MED ORDER — ONDANSETRON HCL 8 MG PO TABS: 8.0000 mg | ORAL_TABLET | Freq: Three times a day (TID) | ORAL | 1 refills | Status: AC | PRN

## 2024-06-07 MED ORDER — PROCHLORPERAZINE MALEATE 10 MG PO TABS: 10.0000 mg | ORAL_TABLET | Freq: Four times a day (QID) | ORAL | 1 refills | Status: AC | PRN

## 2024-06-07 MED ORDER — DEXAMETHASONE 4 MG PO TABS: ORAL_TABLET | ORAL | 1 refills | Status: DC

## 2024-06-07 NOTE — Progress Notes (Signed)
 Dental Form with Estimates of Radiation Dose  Gilbert Thompson Date of birth: 1951/02/17         Diagnosis:    ICD-10-CM   1. Malignant neoplasm of tonsillar fossa (HCC)  C09.0     2. 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       Cancer Staging  Malignant neoplasm of prostate [name]) Staging form: Prostate, AJCC 8th Edition - Clinical stage from 01/19/2023: Stage IIC (cT2a, cN0, cM0, PSA: 6, Grade Group: 4) - Signed by Sherwood Rise, PA-C on 03/30/2023 Histopathologic type: Adenocarcinoma, NOS Stage prefix: Initial diagnosis Prostate specific antigen (PSA) range: Less than 10 Gleason primary pattern: 4 Gleason secondary pattern: 4 Gleason score: 8 Histologic grading system: 5 grade system Number of biopsy cores examined: 14 Number of biopsy cores positive: 5 Location of positive needle core biopsies: One side  Primary squamous cell carcinoma of oropharynx (HCC) Staging form: Pharynx - HPV-Mediated Oropharynx, AJCC 8th Edition - Clinical stage from 06/07/2024: Stage I (cT2, cN1, cM0, p16+) - Signed by Izell Domino, MD on 06/07/2024 Stage prefix: Initial diagnosis  Prognosis: curative  Anticipated # of fractions: 35    Daily?: yes  # of weeks of radiotherapy: 7  Chemotherapy?: likely  Anticipated xerostomia:  Mild permanent    Pre-simulation needs:   Scatter protection / Other  Simulation: ASAP    Other Notes: tumor/positive node adjacent to left jaw.  Please contact Domino Izell, MD, with patient's disposition after evaluation and/or dental treatment. -----------------------------------  Domino Izell, MD

## 2024-06-07 NOTE — Progress Notes (Signed)
 Oncology Nurse Navigator Documentation   Met with patient during initial consult with Dr. Autumn.  He was accompanied by his wife, Heron.  Further introduced myself as his/their Navigator, explained my role as a member of the Care Team. Assisted with post-consult appt scheduling. He is now scheduled to see Dental medicine tomorrow. I will schedule radiation planning when I know that his scatter protective devices will be delivered. They verbalized understanding of information provided. I encouraged them to call with questions/concerns moving forward.  Delon Jefferson, RN, BSN, OCN Head & Neck Oncology Nurse Navigator [facility] [place] [phone]

## 2024-06-07 NOTE — Progress Notes (Signed)
 START ON PATHWAY REGIMEN - Head and Neck     A cycle is every 7 days:     Cisplatin   **Always confirm dose/schedule in your pharmacy ordering system**  Patient Characteristics: Oropharynx, HPV Positive, Preoperative or Nonsurgical Candidate (Clinical Staging), cT0-4, cN1-3 or cT3-4, cN0 Disease Classification: Oropharynx HPV Status: Positive (+) Therapeutic Status: Preoperative or Nonsurgical Candidate (Clinical Staging) AJCC T Category: cT2 AJCC 8 Stage Grouping: I AJCC N Category: cN1 AJCC M Category: cM0 Intent of Therapy: Curative Intent, Discussed with Patient

## 2024-06-07 NOTE — Progress Notes (Signed)
 Oncology Nurse Navigator Documentation   Met with patient during initial consult with Gilbert Thompson. He was accompanied by his wife, Gilbert Thompson.  Further introduced myself as his/their Navigator, explained my role as a member of the Care Team. Provided New Patient resource guide binder: Contact information for physicians, this navigator, other members of the Care Team Advance Directive information; provided [facility] AD booklet at their request,  Fall Prevention Patient Safety Plan Financial Assistance Information sheet Symptom Management Clinic information [facility] map with highlight of [facility] SLP Information sheet Head and Neck cancer basics Nutrition information Patient and family support information including Spiritual care/Chaplain information, Peer mentor program, health and wellness classes, and the survivorship program Community resources  Provided and discussed educational handouts for PEG and PAC. Assisted with post-consult appt scheduling. They are aware that Dr. Nila office will call them for an appointment tomorrow or Monday. Once SPD's are made I will get CT simulation scheduled.  I toured him to the [facility] treatment area, explained procedures for lobby registration, arrival to Radiation Waiting, and arrival to treatment area.    They verbalized understanding of information provided. I encouraged them to call with questions/concerns moving forward.  Delon Jefferson, RN, BSN, OCN Head & Neck Oncology Nurse Navigator [facility] at [place] [phone]

## 2024-06-07 NOTE — Progress Notes (Unsigned)
 [facility]  ONCOLOGY CONSULT NOTE   PATIENT NAME: Gilbert Thompson   MR#: [id number] DOB: [birth year]  DATE OF SERVICE: 06/07/2024   REFERRING PROVIDER  Lauraine Golden, MD  Patient Care Team: Windy Coy, MD as PCP - General (Family Medicine) Pietro Redell RAMAN, MD as PCP - Cardiology (Cardiology) Vertell Pont, RN as Oncology Nurse Navigator Renda Glance, MD as Consulting Physician (Urology) Patrcia Cough, MD as Consulting Physician (Radiation Oncology) Starla Wendelyn BIRCH, RN as Registered Nurse Malmfelt, Delon CROME, RN as Oncology Nurse Navigator Golden Lauraine, MD as Consulting Physician (Radiation Oncology) Autumn Millman, MD as Consulting Physician (Oncology) Lauralee Chew, MD as Referring Physician (Otolaryngology)    CHIEF COMPLAINT/ PURPOSE OF CONSULTATION:   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  ASSESSMENT & PLAN:   Gilbert Thompson is a 73 y.o. 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  No problem-specific Assessment & Plan notes found for this encounter.   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 CT scan from July showed lymph nodes with extranodal extension, indicating a higher risk of recurrence without appropriate treatment. The cancer is aggressive due to lymph node involvement but has not spread beyond the head and neck area. The goal is to cure the cancer using a combination of chemotherapy and radiation. Cisplatin, a platinum-based chemotherapy, will be used to enhance the effectiveness of radiation by making cancer cells  more sensitive to it. Discussed potential side effects of cisplatin, including kidney damage, low blood counts, electrolyte imbalances, nausea, fatigue, hearing loss, and neuropathy. Kidney function will be closely monitored due to a previous creatinine level of 1.4, which may have been due to dehydration. Emphasized the importance of hydration to protect kidney function. The treatment plan includes seven weeks of radiation and weekly chemotherapy, with the possibility of holding chemotherapy towards the end if side effects become problematic. The role of chemotherapy is supportive, making radiation more effective. Weekly dosing of cisplatin is preferred to minimize side effects compared to high-dose every three weeks. - Arrange for port placement for chemotherapy administration - Schedule chemotherapy education session - Plan to start chemotherapy and radiation concurrently in about two weeks - Monitor kidney function and electrolytes weekly during treatment - Refer to physical therapy, speech language pathology, and nutritionist for supportive care - Arrange for a PET scan three months after the last day of radiation to assess treatment progress  Dysphagia and oral symptoms due to head and neck cancer Dysphagia and oral symptoms, including sore throat and altered taste, due to head and neck cancer. Symptoms are expected to worsen with treatment. Discussed the use of a feeding tube to ensure adequate nutrition and hydration during treatment. The feeding tube will be removed once oral intake is sufficient. - Arrange for feeding tube placement - Monitor nutritional status and oral intake with the help of a nutritionist - Prescribe mouthwash to help with oral symptoms  Cancer-related pain Experiencing cancer-related pain, currently managed with hydrocodone , taking one or two tablets per day. Pain is expected to increase around the fourth or fifth week of treatment due to radiation and chemotherapy.  Discussed the possibility of switching  to oxycodone  for more flexible dosing if needed. Emphasized the importance of maintaining comfort and managing pain effectively. - Continue hydrocodone  for pain management - Consider switching to oxycodone  if additional pain management is needed - Provide additional pain management support as needed during treatment - Prescribe mouthwash to help numb oral pain    I reviewed lab results and outside records for this visit and discussed relevant results with the patient. Diagnosis, plan of care and treatment options were also discussed in detail with the patient. Opportunity provided to ask questions and answers provided to his apparent satisfaction. Provided instructions to call our clinic with any problems, questions or concerns prior to return visit. I recommended to continue follow-up with PCP and sub-specialists. He verbalized understanding and agreed with the plan. No barriers to learning was detected.  NCCN guidelines have been consulted in the planning of this patient's care.  [name], MD  06/07/2024 4:49 PM  [facility] - A DEPT OF [facility] [address] [place] [address] Dept: [phone] Dept Fax: [phone]   HISTORY OF PRESENTING ILLNESS:   I have reviewed his chart and materials related to his cancer extensively and collaborated history with the patient. Summary of oncologic history is as follows:  ONCOLOGY HISTORY:  He presented to his PCP with complains of chronic pharyngitis and a left submandibular mass that has persisted over the past year.   To further investigate his symptoms, he underwent a CT neck and CT chest on 04/06/24 showing a 3.0 x 2.4 x 2.5 cm soft tissue mass centered in the left oropharynx at the glossotonsillar sulcus with possible involvement of the adjacent tongue base. 7 mm low-attenuation left level IB lymph node likely reflecting a site  of nodal metastatic disease. 1.7 x 1.7 cm irregular soft tissue focus at the left level II station, inseparable from the adjacent sternocleidomastoid muscle. This is suspicious for nodal metastatic disease (with findings concerning for extracapsular extension) or a tumor deposit. 9 mm short-axis low attenuation left level III lymph node likely reflecting a site of nodal metastatic disease. Scans also noted scattered tiny pulmonary nodules are unchanged and likely benign but with only 5 months of documented stability but with recommend attention on follow-up given his history of prostate cancer.    Subsequently, the patient was referred to Dr. Graig on 04/24/24 who performed a FNA biopsy of left neck. However, biopsy was inconclusive. As a result, he then underwent a laryngoscopy with biopsy on 05/15/24 under the care of Dr. Lauralee. 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 Tumor is moderately differentiated with foci of poor differentiation and invasion into the striated muscle. Immunohistochemical studies are p16, p40, and HRHPV are positive.    He had a cervical spine MRI performed on 05/09/24 revealing no evidence of metastatic disease to the cervical spine itself but did indicated degenerative spondylosis at C4-5 and C5-6. 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    Prior cancers, if any: history of prostate cancer   Oncology History  Malignant neoplasm of  prostate (HCC)  01/19/2023 Cancer Staging   Staging form: Prostate, AJCC 8th Edition - Clinical stage from 01/19/2023: Stage IIC  (cT2a, cN0, cM0, PSA: 6, Grade Group: 4) - Signed by Sherwood Rise, PA-C on 03/30/2023 Histopathologic type: Adenocarcinoma, NOS Stage prefix: Initial diagnosis Prostate specific antigen (PSA) range: Less than 10 Gleason primary pattern: 4 Gleason secondary pattern: 4 Gleason score: 8 Histologic grading system: 5 grade system Number of biopsy cores examined: 14 Number of biopsy cores positive: 5 Location of positive needle core biopsies: One side   02/24/2023 Initial Diagnosis   Malignant neoplasm of prostate (HCC)   Primary squamous cell carcinoma of oropharynx (HCC)  06/07/2024 Initial Diagnosis   Malignant neoplasm of base of tongue (HCC)   06/07/2024 Cancer Staging   Staging form: Pharynx - HPV-Mediated Oropharynx, AJCC 8th Edition - Clinical stage from 06/07/2024: Stage I (cT2, cN1, cM0, p16+) - Signed by Izell Domino, MD on 06/07/2024 Stage prefix: Initial diagnosis   06/21/2024 -  Chemotherapy   Patient is on Treatment Plan : HEAD/NECK Cisplatin (40) q7d       INTERVAL HISTORY:  Discussed the use of AI scribe software for clinical note transcription with the patient, who gave verbal consent to proceed.  History of Present Illness Gilbert Thompson is a 73 year old male with head and neck cancer who presents for consultation regarding chemotherapy and radiation treatment.  He has been experiencing neck swelling for several months, leading to a consultation with his primary doctor. A CT scan in mid-July showed lymph node involvement. He has throat soreness with varying intensity and a change in taste sensation, particularly at the tip of his tongue, which he describes as 'weird' and diminishing.  He is experiencing some difficulty swallowing but managed to eat a sandwich for lunch. He anticipates worsening taste with treatment. He has a history of vocal fold issues, causing his voice to be 'a little worse' than usual, and has undergone therapy for this condition in the past.  He  is taking hydrocodone , one to two tablets per day, for pain management related to neck and back issues, in addition to cancer-related discomfort. He wants to remain pain-free and does not feel dependent on narcotics.  His past medical history includes prostate cancer, for which he underwent surgery in August of the previous year, resulting in a herniated incision mesh. There is no recurrence of prostate cancer, and he is not on any related medications.  He has a history of slightly elevated creatinine level (1.4) noted about a month ago, possibly due to dehydration. No known kidney problems.    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, follwed by [facility]   Dyspnea    with exertion   GERD (gastroesophageal reflux disease)    Hyperlipidemia    Hypertension    Pneumonia     SURGICAL HISTORY: Past Surgical History:  Procedure Laterality Date   CATARACT EXTRACTION Bilateral    LEFT HEART CATH AND CORONARY ANGIOGRAPHY N/A 06/05/2022   Procedure: LEFT HEART CATH AND CORONARY ANGIOGRAPHY;  Surgeon: Claudene Victory ORN, MD;  Location: [facility];  Service: Cardiovascular;  Laterality: N/A;   LYMPHADENECTOMY Bilateral 04/29/2023   Procedure: BILATERAL PELVIC LYMPHADENECTOMY;  Surgeon: Renda,  Gretel, MD;  Location: [facility];  Service: Urology;  Laterality: Bilateral;   PROSTATE BIOPSY     ROBOT ASSISTED LAPAROSCOPIC RADICAL PROSTATECTOMY N/A 04/29/2023   Procedure: XI ROBOTIC ASSISTED LAPAROSCOPIC RADICAL PROSTATECTOMY LEVEL 2;  Surgeon: Renda Gretel, MD;  Location: [facility];  Service: Urology;  Laterality: N/A;  210 MINUTES NEEDED FOR CASE   UPPER GASTROINTESTINAL ENDOSCOPY     VENTRAL HERNIA REPAIR N/A 10/05/2023   Procedure: LAPAROSCOPIC VENTRAL HERNIA REPAAIR WITH MESH;  Surgeon: Rubin Calamity, MD;  Location: [facility] OR;  Service: General;  Laterality: N/A;    SOCIAL HISTORY: He reports that he has never smoked. He has never used smokeless tobacco. He reports current alcohol use of about 14.0 standard drinks of alcohol per week. He reports that he does not use drugs. Social History   Socioeconomic History   Marital status: Married    Spouse name: Not on file   Number of children: 1   Years of education: Not on file   Highest education level: Not on file  Occupational History   Not on file  Tobacco Use   Smoking status: Never   Smokeless tobacco: Never  Vaping Use   Vaping status: Never Used  Substance and Sexual Activity   Alcohol use: Yes    Alcohol/week: 14.0 standard drinks of alcohol    Types: 14 Standard drinks or equivalent per week    Comment: 2 drinks per day   Drug use: Never   Sexual activity: Not on file  Other Topics Concern   Not on file  Social History Narrative   Not on file   Social Drivers of Health   Financial Resource Strain: Not on file  Food Insecurity: No Food Insecurity (06/07/2024)   Hunger Vital Sign    Worried About Running Out of Food in the Last Year: Never true    Ran Out of Food in the Last Year: Never true  Transportation Needs: No Transportation Needs (06/07/2024)   PRAPARE - Administrator, Civil Service (Medical): No    Lack of Transportation (Non-Medical): No  Physical Activity: Not on file  Stress: Not on file  Social Connections: Not on file  Intimate Partner Violence: Not At Risk (06/07/2024)   Humiliation, Afraid, Rape, and Kick questionnaire    Fear of Current or Ex-Partner: No    Emotionally Abused: No    Physically Abused: No    Sexually Abused: No    FAMILY HISTORY: Family History  Problem Relation Age of Onset   Coronary artery disease Father    Prostate cancer Father 77 - 14   Leukemia Father 49   Coronary artery disease Brother    Prostate cancer Brother 66 - 59   Colon cancer Brother 27    Pancreatic cancer Cousin        maternal first cousin   Breast cancer Daughter 52       negative genetic testing   Esophageal cancer Neg Hx    Rectal cancer Neg Hx    Stomach cancer Neg Hx     ALLERGIES:  He has no known allergies.  MEDICATIONS:  Current Outpatient Medications  Medication Sig Dispense Refill   albuterol  (VENTOLIN  HFA) 108 (90 Base) MCG/ACT inhaler Inhale 1-2 puffs into the lungs every 6 (six) hours as needed for wheezing or shortness of breath.     allopurinol  (ZYLOPRIM ) 300 MG tablet Take 300 mg by mouth daily.     ALPRAZolam  (XANAX ) 1 MG tablet Take 1  mg by mouth at bedtime as needed for anxiety or sleep.     aspirin  EC 81 MG tablet Take 81 mg by mouth daily. Swallow whole.     celecoxib (CELEBREX) 200 MG capsule      cholecalciferol (VITAMIN D3) 25 MCG (1000 UNIT) tablet Take 1,000 Units by mouth daily.     ezetimibe  (ZETIA ) 10 MG tablet TAKE 1 TABLET BY MOUTH DAILY 90 tablet 3   finasteride  (PROSCAR ) 5 MG tablet Take 1.25 mg by mouth daily.     HYDROcodone -acetaminophen  (NORCO/VICODIN) 5-325 MG tablet Take 1 tablet by mouth every 6 (six) hours as needed for severe pain.     ibuprofen (ADVIL) 200 MG tablet Take 600 mg by mouth daily as needed for moderate pain (pain score 4-6).     losartan  (COZAAR ) 50 MG tablet Take 1 tablet (50 mg total) by mouth daily. 90 tablet 3   metaxalone (SKELAXIN) 800 MG tablet Take 400 mg by mouth 3 (three) times daily as needed for muscle spasms.     nitroGLYCERIN  (NITROSTAT ) 0.4 MG SL tablet Place 1 tablet (0.4 mg total) under the tongue every 5 (five) minutes as needed for chest pain. 90 tablet 3   pantoprazole  (PROTONIX ) 40 MG tablet Take 1 tablet (40 mg total) by mouth 2 (two) times daily. 180 tablet 3   polyethylene glycol (MIRALAX / GLYCOLAX) 17 g packet Take 17 g by mouth daily as needed for moderate constipation.     rosuvastatin  (CRESTOR ) 40 MG tablet Take 1 tablet (40 mg total) by mouth daily. 90 tablet 3   Tetrahydrozoline  HCl (REDNESS RELIEVER EYE DROPS OP) Place 1 drop into both eyes daily as needed (redness).     TRELEGY ELLIPTA 100-62.5-25 MCG/ACT AEPB Inhale 1 puff into the lungs daily.     dexamethasone  (DECADRON ) 4 MG tablet Take 2 tablets (8 mg) by mouth daily x 3 days starting the day after cisplatin chemotherapy. Take with food. 30 tablet 1   lidocaine -prilocaine  (EMLA ) cream Apply to affected area once 30 g 3   ondansetron  (ZOFRAN ) 8 MG tablet Take 1 tablet (8 mg total) by mouth every 8 (eight) hours as needed for nausea or vomiting. Start on the third day after cisplatin. 30 tablet 1   prochlorperazine  (COMPAZINE ) 10 MG tablet Take 1 tablet (10 mg total) by mouth every 6 (six) hours as needed (Nausea or vomiting). 30 tablet 1   traMADol  (ULTRAM ) 50 MG tablet Take 1 tablet (50 mg total) by mouth every 6 (six) hours as needed. (Patient not taking: Reported on 06/07/2024) 20 tablet 0   No current facility-administered medications for this visit.    REVIEW OF SYSTEMS:    Review of Systems - Oncology  All other pertinent systems were reviewed with the patient and are negative.  PHYSICAL EXAMINATION:   Onc Performance Status - 06/07/24 1325       ECOG Perf Status   ECOG Perf Status Restricted in physically strenuous activity but ambulatory and able to carry out work of a light or sedentary nature, e.g., light house work, office work      KPS SCALE   KPS % SCORE Able to carry on normal activity, minor s/s of disease          Vitals:   06/07/24 1319  BP: (!) 120/56  Pulse: 90  Resp: 17  Temp: 98.1 F (36.7 C)  SpO2: 99%   Filed Weights   06/07/24 1319  Weight: 185 lb 1.6 oz (84 kg)  Physical Exam Constitutional:      General: He is not in acute distress.    Appearance: Normal appearance.  HENT:     Head: Normocephalic and atraumatic.  Eyes:     Conjunctiva/sclera: Conjunctivae normal.  Cardiovascular:     Rate and Rhythm: Normal rate and regular rhythm.  Pulmonary:      Effort: Pulmonary effort is normal. No respiratory distress.  Abdominal:     General: There is no distension.  Neurological:     General: No focal deficit present.     Mental Status: He is alert and oriented to person, place, and time.  Psychiatric:        Mood and Affect: Mood normal.        Behavior: Behavior normal.     ***  LABORATORY DATA:   I have reviewed the data as listed.  Results for orders placed or performed in visit on 06/07/24  CMP (Cancer Center only)  Result Value Ref Range   Sodium 136 135 - 145 mmol/L   Potassium 4.0 3.5 - 5.1 mmol/L   Chloride 102 98 - 111 mmol/L   CO2 28 22 - 32 mmol/L   Glucose, Bld 108 (H) 70 - 99 mg/dL   BUN 21 8 - 23 mg/dL   Creatinine 8.51 (H) 9.38 - 1.24 mg/dL   Calcium  9.7 8.9 - 10.3 mg/dL   Total Protein 7.3 6.5 - 8.1 g/dL   Albumin 4.3 3.5 - 5.0 g/dL   AST 26 15 - 41 U/L   ALT 17 0 - 44 U/L   Alkaline Phosphatase 61 38 - 126 U/L   Total Bilirubin 0.6 0.0 - 1.2 mg/dL   GFR, Estimated 49 (L) >60 mL/min   Anion gap 6 5 - 15  CBC with Differential (Cancer Center Only)  Result Value Ref Range   WBC Count 7.3 4.0 - 10.5 K/uL   RBC 4.55 4.22 - 5.81 MIL/uL   Hemoglobin 14.7 13.0 - 17.0 g/dL   HCT 58.4 60.9 - 47.9 %   MCV 91.2 80.0 - 100.0 fL   MCH 32.3 26.0 - 34.0 pg   MCHC 35.4 30.0 - 36.0 g/dL   RDW 86.6 88.4 - 84.4 %   Platelet Count 271 150 - 400 K/uL   nRBC 0.0 0.0 - 0.2 %   Neutrophils Relative % 80 %   Neutro Abs 5.8 1.7 - 7.7 K/uL   Lymphocytes Relative 10 %   Lymphs Abs 0.7 0.7 - 4.0 K/uL   Monocytes Relative 9 %   Monocytes Absolute 0.7 0.1 - 1.0 K/uL   Eosinophils Relative 0 %   Eosinophils Absolute 0.0 0.0 - 0.5 K/uL   Basophils Relative 1 %   Basophils Absolute 0.1 0.0 - 0.1 K/uL   Immature Granulocytes 0 %   Abs Immature Granulocytes 0.02 0.00 - 0.07 K/uL     RADIOGRAPHIC STUDIES:  I have personally reviewed the radiological images as listed and agree with the findings in the report.  MR CERVICAL  SPINE W WO CONTRAST Result Date: 05/09/2024 CLINICAL DATA:  Cervical spine stenosis. Malignant neoplasm of the oropharynx. EXAM: MRI CERVICAL SPINE WITHOUT AND WITH CONTRAST TECHNIQUE: Multiplanar and multiecho pulse sequences of the cervical spine, to include the craniocervical junction and cervicothoracic junction, were obtained without and with intravenous contrast. CONTRAST:  8 cc Vueway  COMPARISON:  CT neck 04/06/2024.  MRI 08/21/2013. FINDINGS: Alignment: Normal Vertebrae: No focal bone lesion. No evidence of fracture, edematous endplate changes or edematous facet arthritis.  No sign of bony metastatic disease. Cord: No primary cord lesion.  See below regarding stenosis. Posterior Fossa, vertebral arteries, paraspinal tissues: The study is not primarily a soft tissue examination. We can see the tumor apparently involving the left sternocleidomastoid muscle consistent with metastatic disease, as described by CT. The abnormal left level 2 lymph node is also visible. Disc levels: The foramen magnum is widely patent. There is ordinary mild osteoarthritis of the C1-2 articulation but no encroachment upon the neural structures. C2-3: Minimal facet arthritis.  No stenosis. C3-4: Uncovertebral hypertrophy and bulging of the disc. Mild foraminal narrowing right more than left. C4-5: Spondylosis with endplate osteophytes and bulging of the disc. Uncovertebral hypertrophy. Mild facet osteoarthritis. Canal narrowing with effacement of the subarachnoid space surrounding the cord. AP diameter of the canal 8 mm. No frank cord compression. Moderate bilateral foraminal stenosis. C5-6: Endplate osteophytes and shallow protrusion of the disc. Narrowing of the canal with AP diameter in the midline 6.9 mm. Effacement of the subarachnoid space and some deformity of the cord. This could be significant spinal stenosis. Bilateral foraminal stenosis could affect either C6 nerve. C6-7: Mild facet osteoarthritis.  No canal or foraminal  stenosis. C7-T1: Chronic facet osteoarthritis with apparent ankylosis. No canal or foraminal stenosis. IMPRESSION: 1. No evidence of metastatic disease to the cervical spine itself. 2. Degenerative spondylosis at C4-5 and C5-6. 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 Effacement of the subarachnoid space and some deformity of the cord. No abnormal cord T2 signal. Bilateral foraminal stenosis at C4-5 and C5-6 that could affect either or both C5 and C6 nerves. This is more severe at C5-6. 3. The study is not primarily a soft tissue examination. We can see the tumor apparently involving the left sternocleidomastoid muscle consistent with metastatic disease, as described by CT. The abnormal left level 2 lymph node is also visible. Electronically Signed   By: Oneil Officer M.D.   On: 05/09/2024 10:30    Orders Placed This Encounter  Procedures   Consent Attestation for Oncology Treatment    The patient is informed of risks, benefits, side-effects of the prescribed oncology treatment. Potential short term and long term side effects and response rates discussed. After a long discussion, the patient made informed decision to proceed.:   Yes   CBC with Differential (Cancer Center Only)    Standing Status:   Future    Number of Occurrences:   1    Expiration Date:   06/07/2025   CMP (Cancer Center only)    Standing Status:   Future    Number of Occurrences:   1    Expiration Date:   06/07/2025   TSH    Standing Status:   Future    Number of Occurrences:   1    Expiration Date:   06/07/2025   CBC with Differential (Cancer Center Only)    Standing Status:   Future    Expected Date:   06/21/2024    Expiration Date:   06/21/2025   Basic Metabolic Panel - Cancer Center Only    Standing Status:   Future    Expected Date:   06/21/2024    Expiration Date:   06/21/2025   Magnesium     Standing Status:   Future    Expected Date:   06/21/2024    Expiration Date:   06/21/2025   CBC with  Differential (Cancer Center Only)    Standing Status:   Future  Expected Date:   06/28/2024    Expiration Date:   06/28/2025   Basic Metabolic Panel - Cancer Center Only    Standing Status:   Future    Expected Date:   06/28/2024    Expiration Date:   06/28/2025   Magnesium     Standing Status:   Future    Expected Date:   06/28/2024    Expiration Date:   06/28/2025   PHYSICIAN COMMUNICATION ORDER    A baseline Audiogram is recommended prior to initiation of cisplatin chemotherapy.    CODE STATUS:  Code Status History     Date Active Date Inactive Code Status Order ID Comments User Context   04/29/2023 1830 04/30/2023 1811 Full Code [id number]  Renda Glance, MD Inpatient   06/05/2022 0846 06/05/2022 1634 Full Code [id number]  Claudene Victory ORN, MD Inpatient    Questions for Most Recent Historical Code Status (Order [id number])     Question Answer   By: Consent: discussion documented in EHR            Future Appointments  Date Time Provider Department Center  06/14/2024  4:00 PM [facility] CHEMO EDU [facility] None  07/07/2024 10:40 AM McMichael, Bayley M, PA-C [facility]     I spent a total of 70 minutes during this encounter with the patient including review of chart and various tests results, discussions about plan of care and coordination of care plan.  This document was completed utilizing speech recognition software. Grammatical errors, random word insertions, pronoun errors, and incomplete sentences are an occasional consequence of this system due to software limitations, ambient noise, and hardware issues. Any formal questions or concerns about the content, text or information contained within the body of this dictation should be directly addressed to the provider for clarification.

## 2024-06-08 ENCOUNTER — Encounter: Payer: Self-pay | Admitting: Oncology

## 2024-06-08 ENCOUNTER — Other Ambulatory Visit: Payer: Self-pay

## 2024-06-08 ENCOUNTER — Other Ambulatory Visit: Payer: Self-pay | Admitting: Oncology

## 2024-06-08 DIAGNOSIS — R590 Localized enlarged lymph nodes: Secondary | ICD-10-CM | POA: Diagnosis not present

## 2024-06-08 DIAGNOSIS — G893 Neoplasm related pain (acute) (chronic): Secondary | ICD-10-CM | POA: Insufficient documentation

## 2024-06-08 DIAGNOSIS — K08409 Partial loss of teeth, unspecified cause, unspecified class: Secondary | ICD-10-CM | POA: Diagnosis not present

## 2024-06-08 DIAGNOSIS — C109 Malignant neoplasm of oropharynx, unspecified: Secondary | ICD-10-CM | POA: Diagnosis not present

## 2024-06-08 DIAGNOSIS — K06020 Generalized gingival recession, unspecified: Secondary | ICD-10-CM | POA: Diagnosis not present

## 2024-06-08 DIAGNOSIS — Z91843 Risk for dental caries, high: Secondary | ICD-10-CM | POA: Diagnosis not present

## 2024-06-08 DIAGNOSIS — R221 Localized swelling, mass and lump, neck: Secondary | ICD-10-CM | POA: Diagnosis not present

## 2024-06-08 NOTE — Assessment & Plan Note (Addendum)
 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   CT scan from July 2025 showed lymph nodes with extranodal extension, indicating a higher risk of recurrence without appropriate treatment. The cancer is aggressive due to lymph node involvement but has not spread beyond the head and neck area.   cT2,cN1,cM0,p16+, Stage 1 disease.  Concern for extranodal extension based on imaging.  Today I discussed staging, prognosis, plan of care, treatment options.  Reviewed NCCN guidelines.  We have discussed about role of cisplatin being a radiosensitizer in the treatment of head and neck cancer.  We have discussed about the curative intent of chemoradiation for this patient.     We have discussed about mechanism of action of cisplatin, adverse effects of cisplatin including but not limited to fatigue, nausea, vomiting, increased risk of infections, mucositis, ototoxicity, nephrotoxicity, peripheral neuropathy.  Patient understands that some of the side effects can be permanent and even potentially fatal.  We have discussed about role of Mediport and G-tube for chemotherapy administration and nutrition respectively since most of these patients have severe mucositis during the treatment.  At this time we do not know if weekly cisplatin is inferior to every 21 days cisplatin since there is no head-to-head comparison trial.  I did mention to the patient however weekly cisplatin is well-tolerated with less adverse effects and most of the patients tend to complete treatment as planned.  Patient is willing to proceed with weekly cisplatin.  Kidney function will be closely monitored due to a previous creatinine level of 1.4, which may have been due to dehydration. Emphasized the importance of hydration to protect kidney function. The treatment plan includes seven weeks of radiation and weekly chemotherapy, with the possibility of  holding chemotherapy towards the end if side effects become problematic. The role of chemotherapy is supportive, making radiation more effective.   Labs today revealed creatinine of 1.48.  CBCD unremarkable.  We again emphasized the importance of hydration with him.  If his creatinine remains elevated, cisplatin will not be an option and we will choose either docetaxel or weekly carboplatin/paclitaxel instead.  - Arrange for port placement for chemotherapy administration - Schedule chemotherapy education session - Plan to start chemotherapy and radiation concurrently in about two weeks - Monitor kidney function and electrolytes weekly during treatment - Refer to physical therapy, speech language pathology, and nutritionist for supportive care  I will plan to see him around the same time that he is scheduled to begin radiation treatments.

## 2024-06-08 NOTE — Assessment & Plan Note (Signed)
 Experiencing cancer-related pain, currently managed with hydrocodone , taking one or two tablets per day. Pain is expected to increase around the fourth or fifth week of treatment due to radiation and chemotherapy. Discussed the possibility of switching to oxycodone  for more flexible dosing if needed. Emphasized the importance of maintaining comfort and managing pain effectively. - Continue hydrocodone  for pain management - Consider switching to oxycodone  if additional pain management is needed - Provide additional pain management support as needed during treatment - In the future, we will also prescribe mouthwash to help numb oral pain

## 2024-06-09 ENCOUNTER — Other Ambulatory Visit: Payer: Self-pay

## 2024-06-09 DIAGNOSIS — C109 Malignant neoplasm of oropharynx, unspecified: Secondary | ICD-10-CM

## 2024-06-12 ENCOUNTER — Other Ambulatory Visit: Payer: Self-pay | Admitting: Oncology

## 2024-06-13 ENCOUNTER — Other Ambulatory Visit: Payer: Self-pay

## 2024-06-13 ENCOUNTER — Encounter: Payer: Self-pay | Admitting: Oncology

## 2024-06-13 DIAGNOSIS — C099 Malignant neoplasm of tonsil, unspecified: Secondary | ICD-10-CM | POA: Diagnosis not present

## 2024-06-13 DIAGNOSIS — C109 Malignant neoplasm of oropharynx, unspecified: Secondary | ICD-10-CM

## 2024-06-14 ENCOUNTER — Inpatient Hospital Stay

## 2024-06-14 ENCOUNTER — Telehealth: Payer: Self-pay

## 2024-06-14 ENCOUNTER — Telehealth: Payer: Self-pay | Admitting: Oncology

## 2024-06-14 DIAGNOSIS — C109 Malignant neoplasm of oropharynx, unspecified: Secondary | ICD-10-CM | POA: Diagnosis not present

## 2024-06-14 LAB — BASIC METABOLIC PANEL - CANCER CENTER ONLY
Anion gap: 4 — ABNORMAL LOW (ref 5–15)
BUN: 16 mg/dL (ref 8–23)
CO2: 29 mmol/L (ref 22–32)
Calcium: 9.5 mg/dL (ref 8.9–10.3)
Chloride: 104 mmol/L (ref 98–111)
Creatinine: 1.33 mg/dL — ABNORMAL HIGH (ref 0.61–1.24)
GFR, Estimated: 56 mL/min — ABNORMAL LOW (ref >60)
Glucose, Bld: 88 mg/dL (ref 70–99)
Potassium: 4 mmol/L (ref 3.5–5.1)
Sodium: 137 mmol/L (ref 135–145)

## 2024-06-14 NOTE — Progress Notes (Signed)
 Oncology Nurse Navigator Documentation   Met with Mr. Voorhies and his wife to provide PEG education prior to 06/23/24 placement. Provided port educational handout, showed example, provided guidance for post-surgical dsg removal, site care.  Using  PEG teaching device   and Teach Back, provided education for PEG use and care, including: hand hygiene, gravity bolus administration of daily water  flushes and nutritional supplement, fluids and medications; care of tube insertion site including daily dressing change and cleaning; S&S of infection.   Mr. Seever correctly verbalized procedures for and provided correct return demonstration of gravity administration of water , dressing change and site care.  I provided written instructions for PEG flushing/dressing change in support of verbal instruction.   I provided/described contents of Start of Care Bolus Feeding Kit (2 60 cc syringes, 1 box 4x4 drainage sponges, 1 package mesh briefs, 1 roll paper tape, 1 case Osmolite 1.5).  He voiced understanding he is to start using Osmolite per guidance of Nutrition. He understands I will be available for ongoing PEG support. Provided barium sulfate prep which I obtained from [facility] and reviewed instructions.    Delon Jefferson RN, BSN, OCN Head & Neck Oncology Nurse Navigator [facility] Phone # [phone]  Fax # [phone]

## 2024-06-14 NOTE — Telephone Encounter (Signed)
 [facility] Clinical Social Work  Clinical Social Work was referred by Statistician for assessment of psychosocial needs.  Clinical Social Worker attempted to contact patient by phone to schedule a time to meet to complete assessment. CSW left vm with purpose for call with direct contact. CSW will attempt to reach again.    Lizbeth Sprague, LCSW  Clinical Social Worker [facility]

## 2024-06-14 NOTE — Telephone Encounter (Signed)
 Scheduled appointments per WQ. Called and left VM with appointment details for the patient.

## 2024-06-16 DIAGNOSIS — C090 Malignant neoplasm of tonsillar fossa: Secondary | ICD-10-CM | POA: Diagnosis not present

## 2024-06-19 ENCOUNTER — Ambulatory Visit
Admission: RE | Admit: 2024-06-19 | Discharge: 2024-06-19 | Disposition: A | Discharge status: Home / Self Care | Source: Ambulatory Visit | Attending: Radiation Oncology | Admitting: Radiation Oncology

## 2024-06-19 ENCOUNTER — Inpatient Hospital Stay

## 2024-06-19 ENCOUNTER — Telehealth: Payer: Self-pay | Admitting: Internal Medicine

## 2024-06-19 VITALS — BP 127/61 | HR 58 | Temp 97.7°F | Resp 20 | Ht 69.0 in | Wt 185.4 lb

## 2024-06-19 DIAGNOSIS — C109 Malignant neoplasm of oropharynx, unspecified: Secondary | ICD-10-CM

## 2024-06-19 DIAGNOSIS — C61 Malignant neoplasm of prostate: Secondary | ICD-10-CM | POA: Insufficient documentation

## 2024-06-19 NOTE — Progress Notes (Signed)
 Has armband been applied?  Yes.    Does patient have an allergy to IV contrast dye?: No.   Has patient ever received premedication for IV contrast dye?: No.   Date of lab work: June 14, 2024 BUN: 16 CR: 1.33 eGFR: 56  Does patient take metformin?: No.  Is eGFR >60?: No. If no, when can patient resume? (Must be 48 hrs AFTER they receive IV contrast):    IV site: antecubital left, condition patent and no redness  Has IV site been added to flowsheet?  Yes.    BP 127/61 (BP Location: Left Arm, Patient Position: Sitting, Cuff Size: Large)   Pulse (!) 58   Temp 97.7 F (36.5 C)   Resp 20   Ht 5' 9 (1.753 m)   Wt 185 lb 6.4 oz (84.1 kg)   SpO2 99%   BMI 27.38 kg/m

## 2024-06-19 NOTE — Progress Notes (Signed)
 Oncology Nurse Navigator Documentation   To provide support, encouragement and care continuity, met with Gilbert Thompson before his CT SIM.  He tolerated procedure without difficulty, denied questions/concerns.  We discussed upcoming appointments including head and neck MDC on 10/2, PEG/PAC placement 10/3, and his GI appointment during treatment.  I encouraged him to call me with any questions or concerns at anytime.   Delon Jefferson RN, BSN, OCN Head & Neck Oncology Nurse Navigator  [facility] at [facility] Phone # [phone]  Fax # [phone]

## 2024-06-19 NOTE — Telephone Encounter (Signed)
 Received a call from patient stating he would like a nurse fu call for in depth discussion regarding scheduling. States he works and will be around radiation before upcoming ov. Please review and advise   Thank you

## 2024-06-19 NOTE — Telephone Encounter (Signed)
 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 Advised that as long as we do not come into contact with any bodily fluids of his following radiation, this should not pose a problem. Did discuss that he should reach out to oncology regarding their recommendations for timing of endoscopy/colonoscopy since he is actively undergoing treatment. Patient verbalizes understanding.

## 2024-06-19 NOTE — Progress Notes (Signed)
 [facility] Clinical Social Work  Initial Assessment   Gilbert Thompson is a 73 y.o. year old male presenting alone in provider room prior to CT sim. Clinical Social Work was referred by nurse navigator for assessment of psychosocial needs.   SDOH (Social Determinants of Health) assessments performed: No SDOH Interventions    Flowsheet Row Oncology Navigator from 03/30/2023 in [facility] [place]  SDOH Interventions   Food Insecurity Interventions Intervention Not Indicated  Housing Interventions Intervention Not Indicated  Transportation Interventions Intervention Not Indicated  Utilities Interventions Intervention Not Indicated    SDOH Screenings   Food Insecurity: No Food Insecurity (06/07/2024)  Housing: Low Risk  (06/07/2024)  Transportation Needs: No Transportation Needs (06/07/2024)  Utilities: Not At Risk (06/07/2024)  Alcohol Screen: Low Risk  (02/24/2023)  Depression (PHQ2-9): Low Risk  (06/07/2024)  Tobacco Use: Low Risk  (06/13/2024)   Received from [facility]    PHQ 2/9:    06/07/2024    1:25 PM 06/07/2024    9:57 AM 03/30/2023    3:01 PM  Depression screen PHQ 2/9  Decreased Interest 0 0 0  Down, Depressed, Hopeless 0 0 0  PHQ - 2 Score 0 0 0     Distress Screen completed: No    06/14/2024    7:51 PM  ONCBCN DISTRESS SCREENING  Screening Type Initial Screening  How much distress have you been experiencing in the past week? (0-10) 1  Physical Concerns Type  Pain;Sleep      Family/Social Information:  Housing Arrangement: patient lives with his spouse, three cats, and one dog.  Family members/support persons in your life? Family and Friends. Gilbert Thompson named his spouse, his daughter, and his friends as his support system.  Transportation concerns: no, Gilbert Thompson will transport himself to appointments as long as he can.   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 Gilbert Thompson plans to  keep working as long as possible. Income source: Employment Financial concerns: No Type of concern: None Food access concerns: no Religious or spiritual practice: Not known Advanced directives: No  Coping/ Adjustment to diagnosis: Patient understands treatment plan and what happens next? yes, he understands he will be receiving a port and a feeding tube. Concerns about diagnosis and/or treatment: Gilbert Thompson's primary concerns are pain and sleep. He is hopeful that the feeding tube can be placed on the side he does not sleep on. Denied any major changes in mood. Feels hopeful that treatment will be successful.  Patient reported stressors: Physical issues Current coping skills/ strengths: Ability for insight , Active sense of humor , Average or above average intelligence , Capable of independent living , Communication skills , Financial means , General fund of knowledge , Motivation for treatment/growth , Physical Health , and Supportive family/friends     SUMMARY: Current SDOH Barriers:  No SDOH barriers identified.   Clinical Social Work Clinical Goal(s):  Patient will work with SW to address concerns related to adjustment to diagnosis. Evidenced by interval follow ups. 10/08 and   Interventions: Discussed common feeling and emotions when being diagnosed with cancer, and the importance of support during treatment. CSW discussed the purpose of a multidisciplinary team as treatment continues.  Informed patient of the support team roles and support services at [facility], specifically head and neck support group and the alight peer mentors. Provided CSW contact information and encouraged patient to call with  any questions or concerns    Follow Up Plan: Patient understands CSW will see patient at first treatment. 2nd follow up will be three weeks into treatment following  Patient verbalizes understanding of plan: Yes    Lizbeth Sprague, LCSW Clinical Social Worker [facility]

## 2024-06-20 ENCOUNTER — Other Ambulatory Visit

## 2024-06-20 ENCOUNTER — Other Ambulatory Visit: Payer: Self-pay

## 2024-06-21 NOTE — Therapy (Signed)
 OUTPATIENT PHYSICAL THERAPY HEAD AND NECK BASELINE EVALUATION   Patient Name: Gilbert Thompson MRN: [id number] DOB:1951/06/18, 73 y.o., male Today's Date: 06/22/2024  END OF SESSION:  PT End of Session - 06/22/24 0926     Visit Number 1    Number of Visits 2    Date for Recertification  09/14/24    PT Start Time 0900    PT Stop Time 0925    PT Time Calculation (min) 25 min    Activity Tolerance Patient tolerated treatment well    Behavior During Therapy [facility] for tasks assessed/performed          Past Medical History:  Diagnosis Date   Asthma    Cancer (HCC)    prostate cancer   Coronary artery disease    pt states they were unable to place a stent at that time   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, follwed by [facility]   Dyspnea    with exertion   GERD (gastroesophageal reflux disease)    Hyperlipidemia    Hypertension    Pneumonia    Past Surgical History:  Procedure Laterality Date   CATARACT EXTRACTION Bilateral    LEFT HEART CATH AND CORONARY ANGIOGRAPHY N/A 06/05/2022   Procedure: LEFT HEART CATH AND CORONARY ANGIOGRAPHY;  Surgeon: Claudene Victory ORN, MD;  Location: [facility];  Service: Cardiovascular;  Laterality: N/A;   LYMPHADENECTOMY Bilateral 04/29/2023   Procedure: BILATERAL PELVIC LYMPHADENECTOMY;  Surgeon: Renda Glance, MD;  Location: [facility] OR[facility];  Service: Urology;  Laterality: Bilateral;   PROSTATE BIOPSY     ROBOT ASSISTED LAPAROSCOPIC RADICAL PROSTATECTOMY N/A 04/29/2023   Procedure: XI ROBOTIC ASSISTED LAPAROSCOPIC RADICAL PROSTATECTOMY LEVEL 2;  Surgeon: Renda Glance, MD;  Location: [facility];  Service: Urology;  Laterality: N/A;  210 MINUTES NEEDED FOR CASE   UPPER GASTROINTESTINAL ENDOSCOPY     VENTRAL HERNIA REPAIR N/A 10/05/2023   Procedure: LAPAROSCOPIC VENTRAL HERNIA REPAAIR WITH MESH;  Surgeon: Rubin Calamity, MD;  Location: [facility] OR;  Service: General;  Laterality: N/A;   Patient Active Problem List   Diagnosis Date Noted   Cancer  related pain 06/08/2024   Primary squamous cell carcinoma of oropharynx (HCC) 06/07/2024   Genetic testing 04/23/2023   Malignant neoplasm of prostate (HCC) 02/24/2023   Coronary artery disease involving native coronary artery of native heart without angina pectoris    DOE (dyspnea on exertion)     PCP: Maude Sprague, MD  REFERRING PROVIDER: Lauraine Golden, MD  REFERRING DIAG: C10.9 (ICD-10-CM) - Primary squamous cell carcinoma of oropharynx (HCC)   THERAPY DIAG:  Abnormal posture - Plan: PT plan of care cert/re-cert  Primary squamous cell carcinoma of oropharynx (HCC) - Plan: PT plan of care cert/re-cert  Rationale for Evaluation and Treatment: Rehabilitation  ONSET DATE: 05/15/24  SUBJECTIVE:     SUBJECTIVE STATEMENT: Patient reports they are here today to be seen by their medical team for newly diagnosed cancer of base of tongue.    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 He presented to his PCP with complaints of chronic pharyngitis and a left submandibular mass that has persisted over the past year. CT scans were ordered. 04/06/24 CT neck/chest showed a 3.0 x 2.4 x 2.5 cm soft tissue mass centered in the left oropharynx at the glossotonsillar sulcus with possible involvement of the adjacent tongue base. Scans also noted scattered tiny pulmonary nodules are unchanged and likely  benign but with only 5 months of documented stability but with recommend attention on follow-up given his history of prostate cancer. 04/24/24 He saw Dr. Graig who performed a FNA biopsy of left neck. However, biopsy was inconclusive. As a result, he then underwent a laryngoscopy with biopsy on 05/15/24 under the care of Dr. Lauralee. 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 Tumor is moderately differentiated with foci of poor differentiation and invasion into the striated muscle. Immunohistochemical studies  are p16, p40, and HRHPV are positive.  05/09/24 MRI cervical spine revealing no evidence of metastatic disease. 04/26/24 PET showing FDG avid mass along the left tongue base/oropharynx with left cervical chain nodal metastatic disease with no suspicious pulmonary or mediastinal FDG uptake. He will receive 35 fractions of radiation to his Oropharynx and bilateral neck with weekly chemotherapy. He will start on 06/27/24 and complete 08/14/24. 06/23/24. PEG/PAC. History of prostate cancer, no radiation had surgery.   PATIENT GOALS:   to be educated about the signs and symptoms of lymphedema and learn post op HEP.   PAIN:  Are you having pain? Yes: NPRS scale: 7/10 Pain location: L jaw and neck Pain description: sore, throbbing Aggravating factors: nothing Relieving factors: pain medication  PRECAUTIONS: Active CA  RED FLAGS: None   WEIGHT BEARING RESTRICTIONS: No  FALLS:  Has patient fallen in last 6 months? No Does the patient have a fear of falling that limits activity? No Is the patient reluctant to leave the house due to a fear of falling?No  LIVING ENVIRONMENT: Patient lives with: wife Heron Lives in: House/apartment Has following equipment at home: None  OCCUPATION: full time job doing - sells wiping cloths  LEISURE: plays golf 2-3x/wk, has a total gym 2-3x/wk, yoga, stretches, walking  PRIOR LEVEL OF FUNCTION: Independent   OBJECTIVE: Note: Objective measures were completed at Evaluation unless otherwise noted.  COGNITION: Overall cognitive status: Within functional limits for tasks assessed                  POSTURE:  Forward head and rounded shoulders posture  30 SEC SIT TO STAND: 13 reps in 30 sec without use of UEs which is  Average for patient's age  SHOULDER AROM:   WFL L shoulder is uncomfortable to sleep on   CERVICAL AROM:   Percent limited  Flexion WFL  Extension 75% limited  Right lateral flexion 75% limited  Left lateral flexion 75% limited  Right  rotation 50% limited  Left rotation 50% limited    (Blank rows=not tested)   PATIENT EDUCATION:  Education details: Neck ROM, importance of posture when sitting, standing and lying down, deep breathing, walking program and importance of staying active throughout treatment, CURE article on staying active, Why exercise? flyer, lymphedema and PT info Person educated: Patient Education method: Explanation, Demonstration, Handout Education comprehension: Patient verbalized understanding and returned demonstration  HOME EXERCISE PROGRAM: Patient was instructed today in a home exercise program today for head and neck range of motion exercises. These included active cervical flexion, active cervical extension, active cervical rotation to each direction, upper trap stretch, and shoulder retraction. Patient was encouraged to do these 2-3 times a day, holding for 5 sec each and completing for 5 reps. Pt was educated that once this becomes easier then hold the stretches for 30-60 seconds.    ASSESSMENT:  CLINICAL IMPRESSION: Pt arrives to PT with recently diagnosed base of tongue cancer. He will receive 35 fractions of radiation  to his Oropharynx and bilateral neck with weekly chemotherapy. He will start on 06/27/24 and complete 08/14/24. 06/23/24 Pt's cervical ROM was limited in all directions except for flexion with lateral flexion and extension 75% limited. Pt reports he has a history of neck issues and has been limited for a long time. Educated pt about signs and symptoms of lymphedema as well as anatomy and physiology of lymphatic system. Educated pt in importance of staying as active as possible throughout treatment to decrease fatigue as well as head and neck ROM exercises to decrease loss of ROM. Will see pt after completion of radiation to reassess ROM and assess for lymphedema and to determine therapy needs at that time.  Pt will benefit from skilled therapeutic intervention to improve on the  following deficits: Decreased knowledge of precautions and postural dysfunction. Other deficits: decreased ROM  PT treatment/interventions: ADL/self-care home management, pt/family education, therapeutic exercise. Other interventions [id number]- PT Re-evaluation, 97110-Therapeutic exercises, 97530- Therapeutic activity, [id number]- Neuromuscular re-education, 97535- Self Care, [id number]- Manual therapy, 97760- Orthotic Initial, [phone]- Orthotic/Prosthetic subsequent, and Patient/Family education  REHAB POTENTIAL: Good  CLINICAL DECISION MAKING: Evolving/moderate complexity  EVALUATION COMPLEXITY: Moderate   GOALS: Goals reviewed with patient? YES  LONG TERM GOALS: (STG=LTG)   Name Target Date  Goal status  1 Patient will be able to verbalize understanding of a home exercise program for cervical range of motion, posture, and walking.   Baseline:  No knowledge 06/21/2024 Achieved at eval  2 Patient will be able to verbalize understanding of proper sitting and standing posture. Baseline:  No knowledge 06/21/2024 Achieved at eval  3 Patient will be able to verbalize understanding of lymphedema risk and availability of treatment for this condition Baseline:  No knowledge 06/21/2024 Achieved at eval  4 Pt will demonstrate a return to full cervical ROM and function post operatively compared to baselines and not demonstrate any signs or symptoms of lymphedema.  Baseline: See objective measurements taken today. 09/14/24 New    PLAN:  PT FREQUENCY/DURATION: EVAL and 1 follow up appointment.   PLAN FOR NEXT SESSION: will reassess 2 weeks after completion of radiation to determine needs.  Patient will follow up at outpatient cancer rehab 2 weeks after completion of radiation.  If the patient requires physical therapy at that time, a specific plan will be dictated and sent to the referring physician for approval. The patient was educated today on appropriate basic range of motion exercises to begin now and  continue throughout radiation and educated on the signs and symptoms of lymphedema. Patient verbalized good understanding.     Physical Therapy Information for During and After Head/Neck Cancer Treatment: Lymphedema is a swelling condition that you may be at risk for in your neck and/or face if you have radiation treatment to the area and/or if you have surgery that includes removing lymph nodes.  There is treatment available for this condition and it is not life-threatening.  Contact your physician or physical therapist with concerns. An excellent resource for those seeking information on lymphedema is the National Lymphedema Network's website.  It can be accessed at www.lymphnet.org If you notice swelling in your neck or face at any time following surgery (even if it is many years from now), please contact your doctor or physical therapist to discuss this.  Lymphedema can be treated at any time but it is easier for you if it is treated early on. If you have had surgery to your neck, please check with your surgeon about how soon  to start doing neck range of motion exercises.  If you are not having surgery, I encourage you to start doing neck range of motion exercises today and continue these while undergoing treatment, UNLESS you have irritation of your skin or soft tissue that is aggravated by doing them.  These exercises are intended to help you prevent loss of range of motion and/or to gain range of motion in your neck (which can be limited by tightening effects of radiation), and NOT to aggravate these tissues if they develop sensitivities from treatment. Neck range of motion exercises should be done to the point of feeling a GENTLE, TOLERABLE stretch only.  You are encouraged to start a walking or other exercise program tomorrow and continue this as much as you are able through and after treatment.  Please feel free to call me with any questions. Florina Lanis Carbon, PT, CLT Physical Therapist  and Certified Lymphedema Therapist [facility] [address], [place], [place] [address] [phone] [email or web address]  WALKING  Walking is a great form of exercise to increase your strength, endurance and overall fitness.  A walking program can help you start slowly and gradually build endurance as you go.  Everyone's ability is different, so each person's starting point will be different.  You do not have to follow them exactly.  The are just samples. You should simply find out what's right for you and stick to that program.   In the beginning, you'll start off walking 2-3 times a day for short distances.  As you get stronger, you'll be walking further at just 1-2 times per day.  A. You Can Walk For A Certain Length Of Time Each Day    Walk 5 minutes 3 times per day.  Increase 2 minutes every 2 days (3 times per day).  Work up to 25-30 minutes (1-2 times per day).   Example:   Day 1-2 5 minutes 3 times per day   Day 7-8 12 minutes 2-3 times per day   Day 13-14 25 minutes 1-2 times per day  B. You Can Walk For a Certain Distance Each Day     Distance can be substituted for time.    Example:   3 trips to mailbox (at road)   3 trips to corner of block   3 trips around the block  C. Go to local high school and use the track.    Walk for distance ____ around track  Or time ____ minutes  D. Walk ____ Jog ____ Run ___   Why exercise?  So many benefits! Here are SOME of them: Heart health, including raising your good cholesterol level and reducing heart rate and blood pressure Lung health, including improved lung capacity It burns fats, and most of us  can stand to be leaner, whether or not we are overweight. It increases the body's natural painkillers and mood elevators, so makes you feel better. Not only makes you feel better, but look better too Improves sleep Takes a bite out of stress May decrease your risk of many types  of cancer If you are currently undergoing cancer treatment, exercise may improve your ability to tolerate treatments including chemotherapy. For everybody, it can improve your energy level. Those with cancer-related fatigue report a 40-50% reduction in this symptom when exercising regularly. If you are a survivor of breast, colon, or prostate cancer, it may decrease your risk of a recurrence. (This may hold for other cancers too, but so far we  have data just for these three types.)  How to exercise: Get your doctor's okay. Pick something you enjoy doing, like walking, Zumba, biking, swimming, or whatever. Start at low intensity and time, then gradually increase.  (See walking program handout.) Set a goal to achieve over time.  The American Cancer Society, American Heart Association, and U.S. Dept. of Health and Human Services recommend 150 minutes of moderate exercise, 75 minutes of vigorous exercise, or a combination of both per week. This should be done in episodes at least 10 minutes long, spread throughout the week.  Need help being motivated? Pick something you enjoy doing, because you'll be more inclined to stick with that activity than something that feels like a chore. Do it with a friend so that you are accountable to each other. Schedule it into your day. Place it on your calendar and keep that appointment just like you do any appointment that you make. Join an exercise group that meets at a specific time.  That way, you have to show up on time, and that makes it harder to procrastinate about doing your workout.  It also keeps you accountable--people begin to expect you to be there. Join a gym where you feel comfortable and not intimidated, at the right cost. Sign up for something that you'll need to be in shape for on a specific date, like a 1K or a 5K to walk or run, a 20 or 30 mile bike ride, a mud run or something like that. If the date is looming, you know you'll need to train to be  ready for it.  An added benefit is that many of these are fundraisers for good causes. If you've already paid for a gym membership, group exercise class or event, you might as well work out, so you haven't wasted your money!    [employer], PT 06/22/2024, 9:30 AM

## 2024-06-21 NOTE — Progress Notes (Signed)
 Pharmacist Chemotherapy Monitoring - Initial Assessment    Anticipated start date: 06/28/24   The following has been reviewed per standard work regarding the patient's treatment regimen: The patient's diagnosis, treatment plan and drug doses, and organ/hematologic function Lab orders and baseline tests specific to treatment regimen  The treatment plan start date, drug sequencing, and pre-medications Prior authorization status  Patient's documented medication list, including drug-drug interaction screen and prescriptions for anti-emetics and supportive care specific to the treatment regimen The drug concentrations, fluid compatibility, administration routes, and timing of the medications to be used The patient's access for treatment and lifetime cumulative dose history, if applicable  The patient's medication allergies and previous infusion related reactions, if applicable   Changes made to treatment plan:  N/A  Follow up needed:  N/A   Gilbert Thompson, Pharm.D., CPP 06/21/2024@3 :00 PM

## 2024-06-21 NOTE — Therapy (Signed)
 OUTPATIENT SPEECH LANGUAGE PATHOLOGY ONCOLOGY EVALUATION   Patient Name: Gilbert Thompson MRN: [id number] DOB:May 17, 1951, 73 y.o., male Today's Date: 06/22/2024  PCP: Windy Coy, MD REFERRING PROVIDER: Izell Domino, MD  END OF SESSION:  End of Session - 06/22/24 1054     Visit Number 1    Number of Visits 3    Date for Recertification  09/20/24    SLP Start Time 1006    SLP Stop Time  1048    SLP Time Calculation (min) 42 min    Activity Tolerance Patient tolerated treatment well          Past Medical History:  Diagnosis Date   Asthma    Cancer (HCC)    prostate cancer   Coronary artery disease    pt states they were unable to place a stent at that time   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, follwed by [facility]   Dyspnea    with exertion   GERD (gastroesophageal reflux disease)    Hyperlipidemia    Hypertension    Pneumonia    Past Surgical History:  Procedure Laterality Date   CATARACT EXTRACTION Bilateral    LEFT HEART CATH AND CORONARY ANGIOGRAPHY N/A 06/05/2022   Procedure: LEFT HEART CATH AND CORONARY ANGIOGRAPHY;  Surgeon: Claudene Victory ORN, MD;  Location: [facility];  Service: Cardiovascular;  Laterality: N/A;   LYMPHADENECTOMY Bilateral 04/29/2023   Procedure: BILATERAL PELVIC LYMPHADENECTOMY;  Surgeon: Renda Glance, MD;  Location: [facility];  Service: Urology;  Laterality: Bilateral;   PROSTATE BIOPSY     ROBOT ASSISTED LAPAROSCOPIC RADICAL PROSTATECTOMY N/A 04/29/2023   Procedure: XI ROBOTIC ASSISTED LAPAROSCOPIC RADICAL PROSTATECTOMY LEVEL 2;  Surgeon: Renda Glance, MD;  Location: [facility];  Service: Urology;  Laterality: N/A;  210 MINUTES NEEDED FOR CASE   UPPER GASTROINTESTINAL ENDOSCOPY     VENTRAL HERNIA REPAIR N/A 10/05/2023   Procedure: LAPAROSCOPIC VENTRAL HERNIA REPAAIR WITH MESH;  Surgeon: Rubin Calamity, MD;  Location: [facility] OR;  Service: General;  Laterality: N/A;   Patient Active Problem List   Diagnosis Date Noted    Cancer related pain 06/08/2024   Primary squamous cell carcinoma of oropharynx (HCC) 06/07/2024   Genetic testing 04/23/2023   Malignant neoplasm of prostate (HCC) 02/24/2023   Coronary artery disease involving native coronary artery of native heart without angina pectoris    DOE (dyspnea on exertion)     ONSET DATE: See pertinent history below   REFERRING DIAG:  C10.9 (ICD-10-CM) - Primary squamous cell carcinoma of oropharynx (HCC)    THERAPY DIAG:  Dysphagia, unspecified type  Rationale for Evaluation and Treatment: Rehabilitation  SUBJECTIVE:   SUBJECTIVE STATEMENT: Pt denies overt s/sx oropharyngeal dysphagia, currently.  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 Hx: He presented to his PCP with complaints of chronic pharyngitis and a left submandibular mass that has persisted over the past year. CT scans were ordered. 04/06/24 CT neck/chest showed a 3.0 x 2.4 x 2.5 cm soft tissue mass centered in the left oropharynx at the glossotonsillar sulcus with possible involvement of the adjacent tongue base. Scans also noted scattered tiny pulmonary nodules are unchanged and likely benign but with only 5 months of documented stability but with recommend attention on follow-up given his history of prostate cancer. 04/24/24 He saw Dr. Graig who performed a FNA biopsy of left neck. However, biopsy was inconclusive. As  a result, he then underwent a laryngoscopy with biopsy on 05/15/24 under the care of Dr. Lauralee. 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 Tumor is moderately differentiated with foci of poor differentiation and invasion into the striated muscle. Immunohistochemical studies are p16, p40, and HRHPV are positive. 05/09/24 MRI cervical spine revealing no evidence of metastatic disease. 04/26/24 PET showing FDG avid mass along the left tongue  base/oropharynx with left cervical chain nodal metastatic disease with no suspicious pulmonary or mediastinal FDG uptake  Consult with Dr. Izell and Dr. Autumn 06/07/24. He will receive chemotherapy and radiation. Treatment plan: He will receive 35 fractions of radiation to his Oropharynx and bilateral neck with weekly chemotherapy. He will start on 06/27/24 and complete 08/14/24. Pretreatment procedures:06/23/24 PEG/PAC  PAIN:  Are you having pain? Yes: NPRS scale: 7/10 Pain location: lt throat  Pain description: sharp, throbbing Aggravating factors: denies Relieving factors: meds  FALLS: Has patient fallen in last 6 months?  No  LIVING ENVIRONMENT: Lives with: lives with their spouse Lives in: House/apartment  PLOF:  Level of assistance: Independent with ADLs, Independent with IADLs Employment: Retired  PATIENT GOALS: Have normal swallowing  OBJECTIVE:  Note: Objective measures were completed at Evaluation unless otherwise noted.  INSTRUMENTAL SWALLOW STUDY FINDINGS (MBSS) None noted in [facility] to date  COGNITION: Overall cognitive status: Within functional limits for tasks assessed  LANGUAGE: Receptive and Expressive language appeared WNL.  ORAL MOTOR EXAMINATION: Overall status: WFL  MOTOR SPEECH: Overall motor speech: Appears intact Respiration: thoracic breathing Phonation: breathy; pt with vocal fold atrophy dx'd at [facility] Resonance: WFL Articulation: Appears intact Intelligibility: Intelligible  SUBJECTIVE DYSPHAGIA REPORTS:  Date of onset: n/a Reported symptoms: none reported today  Current diet: regular and thin liquids  Co-morbid voice changes: Yes but due to atrophy  FACTORS WHICH MAY INCREASE RISK OF ADVERSE EVENT IN PRESENCE OF ASPIRATION:  General health: well appearing  Risk factors: none evident  however undergoing ChRT for head/neck cancer  CLINICAL SWALLOW ASSESSMENT:   Dentition: adequate natural dentition Vocal quality at baseline: breathy and low  vocal intensity Patient directly observed with POs: Yes: dysphagia 3 (soft) and thin liquids  Feeding: able to feed self Liquids provided by: cup Oral phase signs and symptoms: none noted Pharyngeal phase signs and symptoms: none noted                                                                                                                            TREATMENT DATE:   06/22/24: Research states the risk for dysphagia increases due to radiation and/or chemotherapy treatment due to a variety of factors, so SLP educated the pt about the possibility of reduced/limited ability for PO intake during rad tx. SLP also educated pt regarding possible changes to swallowing musculature after rad tx, and why adherence to dysphagia HEP provided today and PO consumption was necessary to inhibit muscle fibrosis following rad tx and to mitigate  muscle disuse atrophy. SLP informed pt why this would be detrimental to their swallowing status and to their pulmonary health. Pt demonstrated understanding of these things to SLP. SLP encouraged pt to safely eat and drink as deep into their radiation/chemotherapy as possible to provide the best possible long-term swallowing outcome for pt.    SLP then developed an individualized HEP for pt involving oral and pharyngeal strengthening and ROM and pt was instructed how to perform these exercises, including SLP demonstration. After SLP demonstration, pt return demonstrated each exercise. SLP ensured pt performance was correct prior to educating pt on next exercise. Pt required usual min-mod cues faded to modified independent to perform HEP. Pt was instructed to complete this program 6-7 days/week, at least 2 times a day until 6 months after his or her last day of rad tx, and then x2 a week after that, indefinitely. Among other modifications for days when pt cannot functionally swallow, SLP also suggested pt to perform only non-swallowing tasks on the handout/HEP, and if necessary  to cycle through the swallowing portion so the full program of exercises can be completed instead of fatiguing on one of the swallowing exercises and being unable to perform the other swallowing exercises. SLP instructed that swallowing exercises should then be added back into the regimen as pt is able to do so.   PATIENT EDUCATION: Education details: late effects head/neck radiation on swallow function, HEP procedure, and modification to HEP when difficulty experienced with swallowing during and after radiation course Person educated: Patient Education method: Explanation, Demonstration, Verbal cues, and Handouts Education comprehension: verbalized understanding, returned demonstration, verbal cues required, and needs further education   ASSESSMENT:  CLINICAL IMPRESSION: Patient is a 73 y.o. M who was seen today for assessment of swallowing as they undergo radiation/chemoradiation therapy. Today pt ate [place] sandwich and drank thin liquids without overt s/s oral or pharyngeal difficulty. At this time pt swallowing is deemed WNL/WFL with these POs. No oral or overt s/sx pharyngeal deficits, including aspiration were observed. There are no overt s/s aspiration PNA observed by SLP nor any reported by pt at this time. Data indicate that pt's swallow ability will likely decrease over the course of radiation/chemoradiation therapy and could very well decline over time following the conclusion of that therapy due to muscle disuse atrophy and/or muscle fibrosis. Pt will cont to need to be seen by SLP in order to assess safety of PO intake, assess the need for recommending any objective swallow assessment, and ensuring pt is correctly completing the individualized HEP.  OBJECTIVE IMPAIRMENTS: include dysphagia. These impairments are limiting patient from safety when swallowing. Factors affecting potential to achieve goals and functional outcome are none noted today. Patient will benefit from skilled SLP  services to address above impairments and improve overall function.   REHAB POTENTIAL: Good     GOALS: Goals reviewed with patient? No   SHORT TERM GOALS: Target: 3rd total session   1. Pt will complete HEP with modified independence in 2 sessions Baseline: Goal status: INITIAL   2.  pt will tell SLP why pt is completing HEP with modified independence Baseline:  Goal status: INITIAL   3.  pt will describe 3 overt s/s aspiration PNA with modified independence Baseline:  Goal status: INITIAL   4.  pt will tell SLP how a food journal could hasten return to a more normalized diet Baseline:  Goal status: INITIAL     LONG TERM GOALS: Target: 7th total session   1.  pt will complete HEP with independence over two visits Baseline:  Goal status: INITIAL   2.  pt will describe how to modify HEP over time, and the timeline associated with reduction in HEP frequency with modified independence over two sessions Baseline:  Goal status: INITIAL     PLAN:   SLP FREQUENCY:  once approx every 4 weeks   SLP DURATION:  7 sessions   PLANNED INTERVENTIONS: Aspiration precaution training, Pharyngeal strengthening exercises, Diet toleration management , Trials of upgraded texture/liquids, SLP instruction and feedback, Compensatory strategies, and Patient/family education, [phone] (treatment of swallowing dysfunction and/or oral function for feeding)   Nilda Keathley, CCC-SLP 06/22/2024, 10:55 AM

## 2024-06-22 ENCOUNTER — Encounter: Payer: Self-pay | Admitting: Physical Therapy

## 2024-06-22 ENCOUNTER — Ambulatory Visit: Attending: Radiation Oncology

## 2024-06-22 ENCOUNTER — Ambulatory Visit: Attending: Radiation Oncology | Admitting: Physical Therapy

## 2024-06-22 ENCOUNTER — Other Ambulatory Visit: Payer: Self-pay | Admitting: Radiology

## 2024-06-22 ENCOUNTER — Other Ambulatory Visit: Payer: Self-pay

## 2024-06-22 DIAGNOSIS — R490 Dysphonia: Secondary | ICD-10-CM | POA: Insufficient documentation

## 2024-06-22 DIAGNOSIS — C109 Malignant neoplasm of oropharynx, unspecified: Secondary | ICD-10-CM | POA: Insufficient documentation

## 2024-06-22 DIAGNOSIS — R293 Abnormal posture: Secondary | ICD-10-CM | POA: Diagnosis not present

## 2024-06-22 DIAGNOSIS — R1310 Dysphagia, unspecified: Secondary | ICD-10-CM | POA: Diagnosis not present

## 2024-06-22 NOTE — Patient Instructions (Addendum)
 SWALLOWING EXERCISES Do these 5-6 days/week until 6 months after your last day of radiation, then 2 days per week afterwards You can use 1-2 drops of liquid to help you swallow, if your mouth gets dry  Effortful Swallows - Press your tongue against the roof of your mouth for 3 seconds, then swallow as hard as you can - Do at least 20 reps/day, in sets of 5-10  Masako Swallow - swallow with your tongue sticking out - Stick tongue out past your lips and gently bite tongue with your teeth - Swallow, while holding your tongue with your teeth - Do at least 20 reps/day, in sets of 5-10   Shaker Exercise - head lift - Lie flat on your back in your bed, the floor, or a couch  - Raise your head and look at your feet - KEEP YOUR SHOULDERS DOWN - HOLD FOR 45-60 SECONDS, then lower your head back down - Repeat 3 times, 2-3 times a day  [employer] - "squeeze swallow" exercise - Swallow, and squeeze tight to keep your Adam's Apple up - Hold the squeeze for 5-7 seconds - then relax - Do at least 20 reps/day, in sets of 5-10  5.   Chin tuck  (optional) - Place a rolled up towel (4 inches wide) under your chin near your neck - Tuck your chin and push hard on the towel for 5 seconds - Do at least 20 reps/day, in sets of 5-10

## 2024-06-22 NOTE — Progress Notes (Signed)
 Oncology Nurse Navigator Documentation   I met with Mr. Harig and his wife before and after head and neck MDC today. He is doing well and ready to start his cancer treatment next week. He knows to call me with any questions or concerns at any time.   Delon Jefferson RN, BSN, OCN Head & Neck Oncology Nurse Navigator [facility] Phone # [phone]  Fax # [phone]

## 2024-06-22 NOTE — H&P (Signed)
 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  He is scheduled today for image guided Port-A-Cath and gastrostomy tube placements to assist with treatment/nutrition.  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, follwed by [facility]   Dyspnea    with exertion   GERD (gastroesophageal reflux disease)    Hyperlipidemia    Hypertension    Pneumonia     Past Surgical History:  Procedure Laterality Date   CATARACT EXTRACTION Bilateral    LEFT HEART CATH AND CORONARY ANGIOGRAPHY N/A 06/05/2022   Procedure: LEFT HEART CATH AND CORONARY ANGIOGRAPHY;  Surgeon: Claudene Victory ORN, MD;  Location: [facility];  Service: Cardiovascular;  Laterality: N/A;   LYMPHADENECTOMY Bilateral 04/29/2023   Procedure: BILATERAL PELVIC LYMPHADENECTOMY;  Surgeon: Renda Glance, MD;  Location: [facility];  Service: Urology;  Laterality: Bilateral;   PROSTATE BIOPSY     ROBOT ASSISTED LAPAROSCOPIC RADICAL PROSTATECTOMY N/A 04/29/2023   Procedure: XI ROBOTIC ASSISTED LAPAROSCOPIC RADICAL PROSTATECTOMY LEVEL 2;  Surgeon: Renda Glance, MD;  Location: [facility];  Service: Urology;  Laterality: N/A;  210 MINUTES NEEDED FOR CASE    UPPER GASTROINTESTINAL ENDOSCOPY     VENTRAL HERNIA REPAIR N/A 10/05/2023   Procedure: LAPAROSCOPIC VENTRAL HERNIA REPAAIR WITH MESH;  Surgeon: Rubin Calamity, MD;  Location: [facility] OR;  Service: General;  Laterality: N/A;    Allergies: Patient has no known allergies.  Medications: Prior to Admission medications   Medication Sig Start Date End Date Taking? Authorizing Provider  albuterol  (VENTOLIN  HFA) 108 (90 Base) MCG/ACT inhaler Inhale 1-2 puffs into the lungs every 6 (six) hours as needed for wheezing or shortness of breath. 03/03/22   [provider]  allopurinol  (ZYLOPRIM ) 300 MG tablet Take 300 mg by mouth daily. 03/03/22   [provider]  ALPRAZolam  (XANAX ) 1 MG tablet Take 1 mg by mouth at bedtime as needed for anxiety or sleep. 03/05/22   [provider]  aspirin  EC 81 MG tablet Take 81 mg by mouth daily. Swallow whole.    [provider]  celecoxib (CELEBREX) 200 MG capsule  03/07/24   [provider]  cholecalciferol (VITAMIN D3) 25 MCG (1000 UNIT) tablet Take 1,000 Units by mouth daily.    [provider]  dexamethasone  (DECADRON ) 4 MG tablet Take 2 tablets (8 mg) by mouth daily x 3 days starting the day after cisplatin chemotherapy. Take with food. 06/07/24   Pasam, Chinita, MD  ezetimibe  (ZETIA ) 10 MG tablet TAKE 1 TABLET BY MOUTH DAILY 05/18/24   Pietro Redell RAMAN, MD  finasteride  (PROSCAR ) 5 MG tablet Take 1.25 mg by mouth daily.    [provider]  HYDROcodone -acetaminophen  (NORCO/VICODIN) 5-325 MG tablet Take 1 tablet by  mouth every 6 (six) hours as needed for severe pain. 01/28/22   [provider]  ibuprofen (ADVIL) 200 MG tablet Take 600 mg by mouth daily as needed for moderate pain (pain score 4-6).    [provider]  lidocaine -prilocaine  (EMLA ) cream Apply to affected area once 06/07/24   Pasam, Avinash, MD  losartan  (COZAAR ) 50 MG tablet Take 1 tablet (50 mg total) by mouth daily. 03/19/23    Jerilynn Lamarr HERO, NP  metaxalone (SKELAXIN) 800 MG tablet Take 400 mg by mouth 3 (three) times daily as needed for muscle spasms.    [provider]  nitroGLYCERIN  (NITROSTAT ) 0.4 MG SL tablet Place 1 tablet (0.4 mg total) under the tongue every 5 (five) minutes as needed for chest pain. 05/15/22 06/07/24  Daneen Damien BROCKS, NP  ondansetron  (ZOFRAN ) 8 MG tablet Take 1 tablet (8 mg total) by mouth every 8 (eight) hours as needed for nausea or vomiting. Start on the third day after cisplatin. 06/07/24   Pasam, Chinita, MD  pantoprazole  (PROTONIX ) 40 MG tablet Take 1 tablet (40 mg total) by mouth 2 (two) times daily. 03/19/23   Jerilynn Lamarr HERO, NP  polyethylene glycol (MIRALAX / GLYCOLAX) 17 g packet Take 17 g by mouth daily as needed for moderate constipation.    [provider]  prochlorperazine  (COMPAZINE ) 10 MG tablet Take 1 tablet (10 mg total) by mouth every 6 (six) hours as needed (Nausea or vomiting). 06/07/24   Pasam, Chinita, MD  rosuvastatin  (CRESTOR ) 40 MG tablet Take 1 tablet (40 mg total) by mouth daily. 03/19/23   Jerilynn Lamarr HERO, NP  Tetrahydrozoline HCl (REDNESS RELIEVER EYE DROPS OP) Place 1 drop into both eyes daily as needed (redness).    [provider]  traMADol  (ULTRAM ) 50 MG tablet Take 1 tablet (50 mg total) by mouth every 6 (six) hours as needed. Patient not taking: Reported on 06/07/2024 10/05/23 10/04/24  Rubin Calamity, MD  TRELEGY ELLIPTA 100-62.5-25 MCG/ACT AEPB Inhale 1 puff into the lungs daily. 04/04/22   [provider]     Family History  Problem Relation Age of Onset   Coronary artery disease Father    Prostate cancer Father 43 - 72   Leukemia Father 27   Coronary artery disease Brother    Prostate cancer Brother 15 - 59   Colon cancer Brother 30   Pancreatic cancer Cousin        maternal first cousin   Breast cancer Daughter 65       negative genetic testing   Esophageal cancer Neg Hx    Rectal cancer Neg Hx    Stomach  cancer Neg Hx     Social History   Socioeconomic History   Marital status: Married    Spouse name: Not on file   Number of children: 1   Years of education: Not on file   Highest education level: Not on file  Occupational History   Not on file  Tobacco Use   Smoking status: Never   Smokeless tobacco: Never  Vaping Use   Vaping status: Never Used  Substance and Sexual Activity   Alcohol use: Yes    Alcohol/week: 14.0 standard drinks of alcohol    Types: 14 Standard drinks or equivalent per week    Comment: 2 drinks per day   Drug use: Never   Sexual activity: Not on file  Other Topics Concern   Not on file  Social History Narrative   Not on file  Social Drivers of Corporate investment banker Strain: Not on file  Food Insecurity: No Food Insecurity (06/07/2024)   Hunger Vital Sign    Worried About Running Out of Food in the Last Year: Never true    Ran Out of Food in the Last Year: Never true  Transportation Needs: No Transportation Needs (06/07/2024)   PRAPARE - Administrator, Civil Service (Medical): No    Lack of Transportation (Non-Medical): No  Physical Activity: Not on file  Stress: Not on file  Social Connections: Not on file       Review of Systems  Vital Signs:   Advance Care Plan: No documents on file  Physical Exam  Imaging: No results found.  Labs:  CBC: Recent Labs    10/01/23 1016 06/07/24 1402  WBC 11.7* 7.3  HGB 13.6 14.7  HCT 39.9 41.5  PLT 296 271    COAGS: No results for input(s): INR, APTT in the last 8760 hours.  BMP: Recent Labs    10/01/23 1016 05/09/24 1128 06/07/24 1402 06/14/24 1502  NA 136 138 136 137  K 4.0 4.5 4.0 4.0  CL 103 99 102 104  CO2 20* 22 28 29   GLUCOSE 85 83 108* 88  BUN 10 17 21 16   CALCIUM  9.4 9.6 9.7 9.5  CREATININE 1.17 1.43* 1.48* 1.33*  GFRNONAA >60  --  49* 56*    LIVER FUNCTION TESTS: Recent Labs    10/01/23 1016 05/09/24 1128 06/07/24 1402  BILITOT 0.9 0.5  0.6  AST 25 35 26  ALT 16 20 17   ALKPHOS 65 69 61  PROT 7.0 6.6 7.3  ALBUMIN 3.3* 4.3 4.3    TUMOR MARKERS: No results for input(s): AFPTM, CEA, CA199, CHROMGRNA in the last 8760 hours.  Assessment and Plan: 73 y.o. 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  He is scheduled today for image guided Port-A-Cath and gastrostomy tube placements to assist with treatment/nutrition.  Details/risks of procedures, including but not limited to, internal bleeding, infection, venous thrombosis, pneumothorax, injury to adjacent structures discussed with patient with his understanding and consent.   Thank you for allowing our service to participate in Gilbert Thompson 's care.  Electronically Signed: D. Franky Rakers, PA-C   06/22/2024, 5:57 PM      I spent a total of 30 minutes   in face to face in clinical consultation, greater than 50% of which was counseling/coordinating care for image guided Port-A-Cath and percutaneous gastrostomy tube placements

## 2024-06-23 ENCOUNTER — Other Ambulatory Visit: Payer: Self-pay

## 2024-06-23 ENCOUNTER — Telehealth: Payer: Self-pay | Admitting: *Deleted

## 2024-06-23 ENCOUNTER — Ambulatory Visit (HOSPITAL_COMMUNITY)
Admission: RE | Admit: 2024-06-23 | Discharge: 2024-06-23 | Disposition: A | Discharge status: Home / Self Care | Source: Ambulatory Visit | Attending: Oncology | Admitting: Oncology

## 2024-06-23 ENCOUNTER — Encounter (HOSPITAL_COMMUNITY): Payer: Self-pay

## 2024-06-23 ENCOUNTER — Other Ambulatory Visit: Payer: Self-pay | Admitting: Oncology

## 2024-06-23 DIAGNOSIS — I10 Essential (primary) hypertension: Secondary | ICD-10-CM | POA: Insufficient documentation

## 2024-06-23 DIAGNOSIS — C109 Malignant neoplasm of oropharynx, unspecified: Secondary | ICD-10-CM

## 2024-06-23 DIAGNOSIS — I2510 Atherosclerotic heart disease of native coronary artery without angina pectoris: Secondary | ICD-10-CM | POA: Diagnosis not present

## 2024-06-23 DIAGNOSIS — Z8546 Personal history of malignant neoplasm of prostate: Secondary | ICD-10-CM | POA: Diagnosis not present

## 2024-06-23 DIAGNOSIS — C760 Malignant neoplasm of head, face and neck: Secondary | ICD-10-CM | POA: Diagnosis not present

## 2024-06-23 DIAGNOSIS — K219 Gastro-esophageal reflux disease without esophagitis: Secondary | ICD-10-CM | POA: Diagnosis not present

## 2024-06-23 DIAGNOSIS — Z79899 Other long term (current) drug therapy: Secondary | ICD-10-CM | POA: Diagnosis not present

## 2024-06-23 DIAGNOSIS — J45909 Unspecified asthma, uncomplicated: Secondary | ICD-10-CM | POA: Insufficient documentation

## 2024-06-23 DIAGNOSIS — C099 Malignant neoplasm of tonsil, unspecified: Secondary | ICD-10-CM | POA: Diagnosis present

## 2024-06-23 DIAGNOSIS — E785 Hyperlipidemia, unspecified: Secondary | ICD-10-CM | POA: Diagnosis not present

## 2024-06-23 HISTORY — PX: IR IMAGING GUIDED PORT INSERTION: IMG5740

## 2024-06-23 HISTORY — PX: IR GASTROSTOMY TUBE MOD SED: IMG625

## 2024-06-23 LAB — CBC WITH DIFFERENTIAL/PLATELET
Abs Immature Granulocytes: 0.02 K/uL (ref 0.00–0.07)
Basophils Absolute: 0.1 K/uL (ref 0.0–0.1)
Basophils Relative: 1 %
Eosinophils Absolute: 0.1 K/uL (ref 0.0–0.5)
Eosinophils Relative: 1 %
HCT: 40.4 % (ref 39.0–52.0)
Hemoglobin: 13.3 g/dL (ref 13.0–17.0)
Immature Granulocytes: 0 %
Lymphocytes Relative: 15 %
Lymphs Abs: 1 K/uL (ref 0.7–4.0)
MCH: 31.4 pg (ref 26.0–34.0)
MCHC: 32.9 g/dL (ref 30.0–36.0)
MCV: 95.5 fL (ref 80.0–100.0)
Monocytes Absolute: 0.6 K/uL (ref 0.1–1.0)
Monocytes Relative: 9 %
Neutro Abs: 4.8 K/uL (ref 1.7–7.7)
Neutrophils Relative %: 74 %
Platelets: 246 K/uL (ref 150–400)
RBC: 4.23 MIL/uL (ref 4.22–5.81)
RDW: 13.2 % (ref 11.5–15.5)
WBC: 6.6 K/uL (ref 4.0–10.5)
nRBC: 0 % (ref 0.0–0.2)

## 2024-06-23 LAB — BASIC METABOLIC PANEL WITH GFR
Anion gap: 11 (ref 5–15)
BUN: 20 mg/dL (ref 8–23)
CO2: 23 mmol/L (ref 22–32)
Calcium: 9.7 mg/dL (ref 8.9–10.3)
Chloride: 104 mmol/L (ref 98–111)
Creatinine, Ser: 1.11 mg/dL (ref 0.61–1.24)
GFR, Estimated: >60 mL/min (ref >60)
Glucose, Bld: 96 mg/dL (ref 70–99)
Potassium: 3.9 mmol/L (ref 3.5–5.1)
Sodium: 138 mmol/L (ref 135–145)

## 2024-06-23 LAB — PROTIME-INR
INR: 0.9 (ref 0.8–1.2)
Prothrombin Time: 12.9 s (ref 11.4–15.2)

## 2024-06-23 MED ORDER — ONDANSETRON HCL 4 MG/2ML IJ SOLN: 4.0000 mg | INTRAMUSCULAR | Status: DC | PRN

## 2024-06-23 MED ORDER — FENTANYL CITRATE (PF) 100 MCG/2ML IJ SOLN
INTRAMUSCULAR | Status: AC | PRN
  Administered 2024-06-23: 50 ug via INTRAVENOUS

## 2024-06-23 MED ORDER — MIDAZOLAM HCL 2 MG/2ML IJ SOLN
INTRAMUSCULAR | Status: AC
  Filled 2024-06-23: qty 2

## 2024-06-23 MED ORDER — LIDOCAINE VISCOUS HCL 2 % MT SOLN
OROMUCOSAL | Status: AC
  Filled 2024-06-23: qty 15

## 2024-06-23 MED ORDER — CEFAZOLIN SODIUM-DEXTROSE 2-4 GM/100ML-% IV SOLN
2.0000 g | INTRAVENOUS | Status: AC
  Administered 2024-06-23: 2 g via INTRAVENOUS
  Filled 2024-06-23: qty 100

## 2024-06-23 MED ORDER — HEPARIN SOD (PORK) LOCK FLUSH 100 UNIT/ML IV SOLN
INTRAVENOUS | Status: AC
  Filled 2024-06-23: qty 5

## 2024-06-23 MED ORDER — HYDROCODONE-ACETAMINOPHEN 5-325 MG PO TABS: 2.0000 | ORAL_TABLET | ORAL | Status: DC | PRN

## 2024-06-23 MED ORDER — MIDAZOLAM HCL 2 MG/2ML IJ SOLN
INTRAMUSCULAR | Status: AC | PRN
  Administered 2024-06-23: 1 mg via INTRAVENOUS

## 2024-06-23 MED ORDER — LIDOCAINE-EPINEPHRINE 1 %-1:100000 IJ SOLN
20.0000 mL | Freq: Once | INTRAMUSCULAR | Status: AC
  Administered 2024-06-23: 20 mL via INTRADERMAL

## 2024-06-23 MED ORDER — LIDOCAINE HCL 1 % IJ SOLN
INTRAMUSCULAR | Status: AC
  Filled 2024-06-23: qty 20

## 2024-06-23 MED ORDER — HEPARIN SOD (PORK) LOCK FLUSH 100 UNIT/ML IV SOLN
500.0000 [IU] | Freq: Once | INTRAVENOUS | Status: AC
  Administered 2024-06-23: 500 [IU] via INTRAVENOUS

## 2024-06-23 MED ORDER — FENTANYL CITRATE (PF) 100 MCG/2ML IJ SOLN
INTRAMUSCULAR | Status: AC
  Filled 2024-06-23: qty 2

## 2024-06-23 MED ORDER — GLUCAGON HCL RDNA (DIAGNOSTIC) 1 MG IJ SOLR
INTRAMUSCULAR | Status: AC
  Filled 2024-06-23: qty 1

## 2024-06-23 MED ORDER — HYDROMORPHONE HCL 1 MG/ML IJ SOLN
1.0000 mg | INTRAMUSCULAR | Status: DC | PRN
  Administered 2024-06-23: 1 mg via INTRAVENOUS
  Filled 2024-06-23: qty 1

## 2024-06-23 MED ORDER — LIDOCAINE-EPINEPHRINE 1 %-1:100000 IJ SOLN
INTRAMUSCULAR | Status: AC
  Filled 2024-06-23: qty 1

## 2024-06-23 MED ORDER — SODIUM CHLORIDE 0.9 % IV SOLN: INTRAVENOUS | Status: DC

## 2024-06-23 MED ORDER — GLUCAGON HCL RDNA (DIAGNOSTIC) 1 MG IJ SOLR
INTRAMUSCULAR | Status: AC | PRN
  Administered 2024-06-23: 1 mg via INTRAVENOUS

## 2024-06-23 MED ORDER — IOHEXOL 300 MG/ML  SOLN
50.0000 mL | Freq: Once | INTRAMUSCULAR | Status: AC | PRN
Start: 2024-06-23 — End: 2024-06-23
  Administered 2024-06-23: 15 mL

## 2024-06-23 NOTE — Sedation Documentation (Addendum)
 RN Michelene Keniston pulled 4 mg Versed  and 200 mcg Fentanyl  in Ir room pysix. Pt. Received 3 mg Versed  and 150 mcg Fentanyl  throughout the Port-a-catheter placement procedure lasting 20 minutes total from 1200 to 1220. Pt. Received 1 mg Versed  and 50 mcg Fentanyl  throughout the gastrostomy tube placement procedure lasing 15 minutes total from 1220 to 1235 .

## 2024-06-23 NOTE — Discharge Instructions (Addendum)
 Moderate Conscious Sedation-Care After  This sheet gives you information about how to care for yourself after your procedure. Your health care provider may also give you more specific instructions. If you have problems or questions, contact your health care provider.  After the procedure, it is common to have: Sleepiness for several hours. Impaired judgment for several hours. Difficulty with balance. Vomiting if you eat too soon.  Follow these instructions at home:  Rest. Do not participate in activities where you could fall or become injured. Do not drive or use machinery. Do not drink alcohol. Do not take sleeping pills or medicines that cause drowsiness. Do not make important decisions or sign legal documents. Do not take care of children on your own.  Eating and drinking Follow the diet recommended by your health care provider. Drink enough fluid to keep your urine pale yellow. If you vomit: Drink water , juice, or soup when you can drink without vomiting. Make sure you have little or no nausea before eating solid foods.  General instructions Take over-the-counter and prescription medicines only as told by your health care provider. Have a responsible adult stay with you for the time you are told. It is important to have someone help care for you until you are awake and alert. Do not smoke. Keep all follow-up visits as told by your health care provider. This is important.  Contact a health care provider if: You are still sleepy or having trouble with balance after 24 hours. You feel light-headed. You keep feeling nauseous or you keep vomiting. You develop a rash. You have a fever. You have redness or swelling around the IV site.  Get help right away if: You have trouble breathing. You have new-onset confusion at home.  This information is not intended to replace advice given to you by your health care provider. Make sure you discuss any questions you have with your  healthcare provider.Implanted Port Insertion, Care After The following information offers guidance on how to care for yourself after your procedure. Your health care provider may also give you more specific instructions. If you have problems or questions, contact your health care provider. What can I expect after the procedure? After the procedure, it is common to have: Discomfort at the port insertion site. Bruising on the skin over the port. This should improve over 3-4 days. Follow these instructions at home: [employer] care After your port is placed, you will get a manufacturer's information card. The card has information about your port. Keep this card with you at all times. Take care of the port as told by your health care provider. Ask your health care provider if you or a family member can get training for taking care of the port at home. A home health care nurse will be be available to help care for the port. Make sure to remember what type of port you have. Incision care  Follow instructions from your health care provider about how to take care of your port insertion site. Make sure you: Wash your hands with soap and water  for at least 20 seconds before and after you change your bandage (dressing). If soap and water  are not available, use hand sanitizer. Change your dressing as told by your health care provider. Leave stitches (sutures), skin glue, or adhesive strips in place. These skin closures may need to stay in place for 2 weeks or longer. If adhesive strip edges start to loosen and curl up, you may trim the loose edges. Do  not remove adhesive strips completely unless your health care provider tells you to do that. Check your port insertion site every day for signs of infection. Check for: Redness, swelling, or pain. Fluid or blood. Warmth. Pus or a bad smell. Activity Return to your normal activities as told by your health care provider. Ask your health care provider what activities  are safe for you. You may have to avoid lifting. Ask your health care provider how much you can safely lift. General instructions Take over-the-counter and prescription medicines only as told by your health care provider. Do not take baths, swim, or use a hot tub until your health care provider approves. Ask your health care provider if you may take showers. You may only be allowed to take sponge baths. If you were given a sedative during the procedure, it can affect you for several hours. Do not drive or operate machinery until your health care provider says that it is safe. Wear a medical alert bracelet in case of an emergency. This will tell any health care providers that you have a port. Keep all follow-up visits. This is important. Contact a health care provider if: You cannot flush your port with saline as directed, or you cannot draw blood from the port. You have a fever or chills. You have redness, swelling, or pain around your port insertion site. You have fluid or blood coming from your port insertion site. Your port insertion site feels warm to the touch. You have pus or a bad smell coming from the port insertion site. Get help right away if: You have chest pain or shortness of breath. You have bleeding from your port that you cannot control. These symptoms may be an emergency. Get help right away. Call 911. Do not wait to see if the symptoms will go away. Do not drive yourself to the hospital. Summary Take care of the port as told by your health care provider. Keep the manufacturer's information card with you at all times. Change your dressing as told by your health care provider. Contact a health care provider if you have a fever or chills or if you have redness, swelling, or pain around your port insertion site. Keep all follow-up visits. This information is not intended to replace advice given to you by your health care provider. Make sure you discuss any questions you have  with your health care provider. Document Revised: 03/11/2021 Document Reviewed: 03/11/2021 Elsevier Patient Education  2024 [employer].Please call Interventional Radiology clinic [phone] with any questions or concerns.  You may remove your dressing and shower tomorrow.  After the procedure, it is common to have: Mild pain in your abdomen A small amount of blood-colored fluid leaking from the site of your gastrostomy tube (G-tube)   DO NOT Use your G-tube for at least 24 hours from its insertion.  Follow these instructions at home:  Medication: Do not use Aspirin  or ibuprofen products, such as Advil or Motrin, as it may increase bleeding.  You may resume your usual medications as ordered by your doctor. If your doctor prescribed antibiotics, take them as directed. Do not stop taking them just because you feel better. You need to take the full course of antibiotics.  Care of the procedure site  Wash your hands with soap and water  for at least 20 seconds Remove the dressing (if there is one) that is between the skin and the tube Check the area where the tube enters the skin. Check daily for problems  such as: Redness, rash, or irritation Swelling Pus-like drainage Extra skin growth Moisten the cotton swab or gauze with the saline solution or with a soap-and-water  mixture. Gently clean around the insertion site. Remove any drainage or crusted material. When the G-tube is first put in, a normal saline solution or water  can be used to clean the skin After the skin around the tube has healed, mild soap and water  may be used Apply a dressing (if there should be one) between the skin and the tube If the tube comes out: Cover the opening with a clean dressing and tape Get help right away Do not pull or put tension on the tube  Activity Do not take baths, swim, or use a hot tub until your health care provider approves Keep all follow-up visits as told by your doctor  Contact a  health care provider if: You develop constipation or a fever A large amount of fluid or mucus-like liquid is leaking from the tube Skin or scar tissue appears to be growing where the tube enters the skin The length of tube from the insertion site to the G-tube gets longer  Get help right away if: You have severe pain, tenderness, or bloating in the abdomen Nausea or vomiting Trouble breathing or shortness of breath Any of these problems happen in the area where the tube enters the skin: Redness, irritation, swelling, or soreness Pus-like discharge A bad smell The tube is clogged and cannot be flushed The tube comes out. The tube will need to be put back in within 4 hoursA gastrostomy tube, or G-tube, is a tube that is inserted through the abdomen into the stomach. The tube is used to give feedings and medicines when a person cannot eat and drink enough on his or her own or take medicines by mouth.  How to care for the insertion site:  Supplies needed: Saline solution or clean, warm water  and soap. Saline solution is made of salt and water . Cotton swab or gauze. Pre-cut gauze bandage (dressing) and tape, if needed.  Instructions  Follow these steps daily to clean the insertion site: Wash your hands with soap and water  for at least 20 seconds. Remove the dressing (if there is one) that is between the person's skin and the tube. Check the area where the tube enters the skin. Check daily for problems such as: Redness, rash, or irritation. Swelling. Pus-like drainage. Extra skin growth. Moisten the cotton swab or gauze with the saline solution or with a soap-and-water  mixture. Gently clean around the insertion site. Remove any drainage or crusted material. When the G-tube is first put in, a normal saline solution or water  can be used to clean the skin. After the skin around the tube has healed, mild soap and water  may be used. Apply a dressing (if there should be one) between the  person's skin and the tube. Avoid using scissors to remove the dressing. Sharp objects may damage the catheter.  How to flush a G-tube  Flush the G-tube regularly to keep it from clogging. Flush it before and after feedings and as often as told by the health care provider.  Supplies needed: Purified or germ-free (sterile) water , warmed. Container with lid for boiling water , if needed. 60 cc G-tube syringe. Instructions  Before you begin, decide whether to use sterile water  or purified drinking water . Use only sterile water  if: The person has a weak disease-fighting (immune) system. The person has trouble fighting off infections (is immunocompromised). You are unsure  of the amount of chemical contaminants in purified or drinking water . Use purified drinking water  in all other cases. To purify drinking water  by boiling: Boil water  for at least 1 minute. Keep lid over water  while it boils. Let water  cool to room temperature before using.  Follow these steps to flush the G-tube: Wash your hands with soap and water  for at least 20 seconds. Bring out (draw up) 30 mL of warm water  in a syringe. Connect the syringe to the tube. Slowly and gently push the water  into the tube. General tips If the tube comes out: Cover the opening with a clean dressing and tape. Get help right away. If there is skin or scar tissue growing where the tube enters the skin: Keep the area clean and dry. Secure the tube with tape so that the tube does not move around too much. If the tube gets clogged: Slowly push warm water  into the tube with a large syringe. Do not force the fluid into the tube or push an object into the tube. Get help right away if you cannot unclog the tube.  Follow these instructions at home: Feedings  Give feedings at room temperature. If feedings are continuous: Do not put more than 4 hours' worth of feedings in the feeding bag. Stop the feedings when you need to give medicine or  flush the tube. Be sure to restart the feedings. Make sure the person's head is above his or her stomach (upright position). This will prevent choking and discomfort. Make sure the person is in the right position during and after feedings. During feedings, have the person in the upright position. After a non-continuous feeding (bolus feeding), have the person stay in the upright position for 1 hour. Cover and place unused feedings in the refrigerator. Replace feeding bags and syringes as told.  Good hygiene Make sure the person takes good care of his or her mouth and teeth (oral hygiene), such as by brushing his or her teeth. Keep the area where the tube enters the skin clean and dry.  General instructions Do not pull or put tension on the tube. Before you remove the tube, cap or disconnect a syringe, close the tube by using a clamp (clamping) or bending (kinking) the tube. Measure the length of the G-tube every day from the insertion site to the end of the tube. If the person's G-tube has a balloon, check the fluid in the balloon every week. Check the manufacturer's specifications to find the amount of fluid that should be in the balloon. Remove excess air from the G-tube as told. This is called venting. Do not push feedings, medicines, or flushes fast. Use the feeding tube equipment, such as syringes and connectors, only as told by your health care provider.  Contact a health care provider if: The person with the tube has constipation or a fever. A large amount of fluid or mucus-like liquid is leaking from the tube. Skin or scar tissue appears to be growing where the tube enters the skin. The length of tube from the insertion site to the G-tube gets longer.  Get help right away if: The person with the tube has any of these problems: Severe pain, tenderness, or bloating in the abdomen. Nausea or vomiting. Trouble breathing or shortness of breath. Any of these problems happen in the  area where the tube enters the skin: Redness, irritation, swelling, or soreness Pus-like discharge A bad smell The tube is clogged and cannot be flushed. The tube  comes out. The tube will need to be put back in within 4 hours.

## 2024-06-23 NOTE — Procedures (Signed)
 Vascular and Interventional Radiology Procedure Note  Patient: Gilbert Thompson DOB: 06/30/1951 Medical Record Number: [id number] Note Date/Time: 06/23/24 11:23 AM   Performing Physician: Thom Hall, MD Assistant(s): None  Diagnosis: Head and Neck cancer  Procedure:  PORT PLACEMENT PERCUTANEOUS GASTROSTOMY TUBE PLACEMENT  Anesthesia: Conscious Sedation Complications: None Estimated Blood Loss: Minimal  Findings:  Successful right-sided port placement, with the tip of the catheter in the proximal right atrium.  Successful placement of a 17F gastrostomy tube under fluoroscopy.  Plan: Port catheter ready for use.  Liquid diet x 24 hrs OK to cap G-tube for 2 hours post liquid meal. OK for meds per tube immediately post procedure. OK to begin TFs via G-tube use in 4 hrs. Taper from trickle to goal as tolerated.   Pt is to return to [facility] for routine G-tube exchange in 6 months.  See detailed procedure note with images in PACS. The patient tolerated the procedure well without incident or complication and was returned to [facility] in stable condition.    Thom Hall, MD [facility]   Pager. [phone] Clinic. [phone]

## 2024-06-23 NOTE — Telephone Encounter (Signed)
 Spoke with pt, aware Dr Pietro has reviewed the strips from the procedure today and patient aware nothing to worry about at this time. He denies any problems with feeling faint or passing out. Aware to call if he were to have any problems.

## 2024-06-23 NOTE — Discharge Instructions (Signed)
 Implanted Port Insertion, Care After The following information offers guidance on how to care for yourself after your procedure. Your health care provider may also give you more specific instructions. If you have problems or questions, contact your health care provider. What can I expect after the procedure? After the procedure, it is common to have: Discomfort at the port insertion site. Bruising on the skin over the port. This should improve over 3-4 days. Follow these instructions at home: [facility] care After your port is placed, you will get a manufacturer's information card. The card has information about your port. Keep this card with you at all times. Take care of the port as told by your health care provider. Ask your health care provider if you or a family member can get training for taking care of the port at home. A home health care nurse will be be available to help care for the port. Make sure to remember what type of port you have. Incision care  Follow instructions from your health care provider about how to take care of your port insertion site. Make sure you: Wash your hands with soap and water for at least 20 seconds before and after you change your bandage (dressing). If soap and water are not available, use hand sanitizer. Change your dressing as told by your health care provider. Leave stitches (sutures), skin glue, or adhesive strips in place. These skin closures may need to stay in place for 2 weeks or longer. If adhesive strip edges start to loosen and curl up, you may trim the loose edges. Do not remove adhesive strips completely unless your health care provider tells you to do that. Check your port insertion site every day for signs of infection. Check for: Redness, swelling, or pain. Fluid or blood. Warmth. Pus or a bad smell. Activity Return to your normal activities as told by your health care provider. Ask your health care provider what activities are safe for  you. You may have to avoid lifting. Ask your health care provider how much you can safely lift. General instructions Take over-the-counter and prescription medicines only as told by your health care provider. Do not take baths, swim, or use a hot tub until your health care provider approves. Ask your health care provider if you may take showers. You may only be allowed to take sponge baths. If you were given a sedative during the procedure, it can affect you for several hours. Do not drive or operate machinery until your health care provider says that it is safe. Wear a medical alert bracelet in case of an emergency. This will tell any health care providers that you have a port. Keep all follow-up visits. This is important. Contact a health care provider if: You cannot flush your port with saline as directed, or you cannot draw blood from the port. You have a fever or chills. You have redness, swelling, or pain around your port insertion site. You have fluid or blood coming from your port insertion site. Your port insertion site feels warm to the touch. You have pus or a bad smell coming from the port insertion site. Get help right away if: You have chest pain or shortness of breath. You have bleeding from your port that you cannot control. These symptoms may be an emergency. Get help right away. Call 911. Do not wait to see if the symptoms will go away. Do not drive yourself to the hospital. Summary Take care of the port as told  by your health care provider. Keep the manufacturer's information card with you at all times. Change your dressing as told by your health care provider. Contact a health care provider if you have a fever or chills or if you have redness, swelling, or pain around your port insertion site. Keep all follow-up visits. This information is not intended to replace advice given to you by your health care provider. Make sure you discuss any questions you have with your  health care provider. Document Revised: 03/11/2021 Document Reviewed: 03/11/2021 Elsevier Patient Education  2024 [employer].Moderate Conscious Sedation-Care After  This sheet gives you information about how to care for yourself after your procedure. Your health care provider may also give you more specific instructions. If you have problems or questions, contact your health care provider.  After the procedure, it is common to have: Sleepiness for several hours. Impaired judgment for several hours. Difficulty with balance. Vomiting if you eat too soon.  Follow these instructions at home:  Rest. Do not participate in activities where you could fall or become injured. Do not drive or use machinery. Do not drink alcohol. Do not take sleeping pills or medicines that cause drowsiness. Do not make important decisions or sign legal documents. Do not take care of children on your own.  Eating and drinking Follow the diet recommended by your health care provider. Drink enough fluid to keep your urine pale yellow. If you vomit: Drink water, juice, or soup when you can drink without vomiting. Make sure you have little or no nausea before eating solid foods.  General instructions Take over-the-counter and prescription medicines only as told by your health care provider. Have a responsible adult stay with you for the time you are told. It is important to have someone help care for you until you are awake and alert. Do not smoke. Keep all follow-up visits as told by your health care provider. This is important.  Contact a health care provider if: You are still sleepy or having trouble with balance after 24 hours. You feel light-headed. You keep feeling nauseous or you keep vomiting. You develop a rash. You have a fever. You have redness or swelling around the IV site.  Get help right away if: You have trouble breathing. You have new-onset confusion at home.  This information is  not intended to replace advice given to you by your health care provider. Make sure you discuss any questions you have with your healthcare provider.

## 2024-06-23 NOTE — Progress Notes (Signed)
 Oncology Nurse Navigator Documentation   I called to check on Gilbert Thompson after his PAC/PEG were placed today. He tells me that he is doing well and denies any questions or concerns at this time. I will meet with him on Tuesday 06/27/24 when he comes for his first radiation appointment. He has my direct contact information and knows to call me if I can help him with any questions or concerns.  Delon Jefferson RN, BSN, OCN Head & Neck Oncology Nurse Navigator [facility] at [facility] Phone # [phone]  Fax # [phone]

## 2024-06-23 NOTE — Telephone Encounter (Signed)
 Per message from Keene Bern RN, Good afternoon Adrien! This is Dr. Clovis Office over in Oncology at [facility]. I am Dr. Clovis Nurse. I received a call from Interventional Radiology today reporting that patient had 1+ seconds of cardiac pauses multiple times while he was under their care to receive Port and G-tube placements. Dr. Autumn would like to see if Dr. Pietro can follow up with this patient as he has seen this patient in the past. Please let me know of the plan. Thank you so much!   Left message for pt to call

## 2024-06-23 NOTE — Progress Notes (Signed)
 PA Franky Rakers stated to hold pt. Until follow up with Cardiology- due to pt. HR prior and during procedure today (having heart block and short pauses per report)  [address] in to see patient and wife and explained that his cardiologist RN or office will call for follow up appointment . Both patient and wife having understanding.

## 2024-06-23 NOTE — Telephone Encounter (Signed)
 Patient returned RN's call.

## 2024-06-25 ENCOUNTER — Other Ambulatory Visit: Payer: Self-pay

## 2024-06-27 ENCOUNTER — Ambulatory Visit
Admission: RE | Admit: 2024-06-27 | Discharge: 2024-06-27 | Disposition: A | Discharge status: Home / Self Care | Source: Ambulatory Visit | Attending: Radiation Oncology | Admitting: Radiation Oncology

## 2024-06-27 ENCOUNTER — Other Ambulatory Visit: Payer: Self-pay

## 2024-06-27 DIAGNOSIS — Z510 Encounter for antineoplastic radiation therapy: Secondary | ICD-10-CM | POA: Insufficient documentation

## 2024-06-27 DIAGNOSIS — C090 Malignant neoplasm of tonsillar fossa: Secondary | ICD-10-CM | POA: Diagnosis not present

## 2024-06-27 DIAGNOSIS — C770 Secondary and unspecified malignant neoplasm of lymph nodes of head, face and neck: Secondary | ICD-10-CM | POA: Diagnosis not present

## 2024-06-27 LAB — RAD ONC ARIA SESSION SUMMARY
Course Elapsed Days: 0
Plan Fractions Treated to Date: 1
Plan Prescribed Dose Per Fraction: 2 Gy
Plan Total Fractions Prescribed: 35
Plan Total Prescribed Dose: 70 Gy
Reference Point Dosage Given to Date: 2 Gy
Reference Point Session Dosage Given: 2 Gy
Session Number: 1

## 2024-06-27 NOTE — Progress Notes (Signed)
 Oncology Nurse Navigator Documentation   To provide support, encouragement and care continuity, met with Mr. Leeman for his initial RT.  He was accompanied by his wife. I reviewed the 2-step treatment process, answered questions.  Mr. Spickler completed treatment without difficulty, denied questions/concerns. I reviewed the registration/arrival procedure for subsequent treatments. I encouraged them to call me with questions/concerns as treatments proceed.   Delon Jefferson RN, BSN, OCN Head & Neck Oncology Nurse Navigator [facility] at [facility] Phone # [phone]  Fax # [phone]

## 2024-06-28 ENCOUNTER — Inpatient Hospital Stay

## 2024-06-28 ENCOUNTER — Inpatient Hospital Stay: Attending: Oncology

## 2024-06-28 ENCOUNTER — Other Ambulatory Visit: Payer: Self-pay

## 2024-06-28 ENCOUNTER — Encounter: Payer: Self-pay | Admitting: Oncology

## 2024-06-28 ENCOUNTER — Inpatient Hospital Stay: Admitting: Dietician

## 2024-06-28 ENCOUNTER — Ambulatory Visit
Admission: RE | Admit: 2024-06-28 | Discharge: 2024-06-28 | Disposition: A | Discharge status: Home / Self Care | Source: Ambulatory Visit | Attending: Radiation Oncology | Admitting: Radiation Oncology

## 2024-06-28 ENCOUNTER — Other Ambulatory Visit (HOSPITAL_COMMUNITY): Payer: Self-pay

## 2024-06-28 ENCOUNTER — Inpatient Hospital Stay: Admitting: Oncology

## 2024-06-28 VITALS — BP 116/59 | HR 51 | Temp 97.3°F | Resp 16 | Ht 69.0 in | Wt 183.0 lb

## 2024-06-28 VITALS — BP 110/63 | HR 73 | Resp 16

## 2024-06-28 DIAGNOSIS — E871 Hypo-osmolality and hyponatremia: Secondary | ICD-10-CM | POA: Diagnosis not present

## 2024-06-28 DIAGNOSIS — E876 Hypokalemia: Secondary | ICD-10-CM | POA: Diagnosis not present

## 2024-06-28 DIAGNOSIS — C109 Malignant neoplasm of oropharynx, unspecified: Secondary | ICD-10-CM | POA: Diagnosis not present

## 2024-06-28 DIAGNOSIS — G893 Neoplasm related pain (acute) (chronic): Secondary | ICD-10-CM

## 2024-06-28 DIAGNOSIS — Z5111 Encounter for antineoplastic chemotherapy: Secondary | ICD-10-CM | POA: Diagnosis present

## 2024-06-28 DIAGNOSIS — Z79899 Other long term (current) drug therapy: Secondary | ICD-10-CM | POA: Diagnosis not present

## 2024-06-28 DIAGNOSIS — Z510 Encounter for antineoplastic radiation therapy: Secondary | ICD-10-CM | POA: Diagnosis not present

## 2024-06-28 DIAGNOSIS — C779 Secondary and unspecified malignant neoplasm of lymph node, unspecified: Secondary | ICD-10-CM | POA: Insufficient documentation

## 2024-06-28 DIAGNOSIS — C108 Malignant neoplasm of overlapping sites of oropharynx: Secondary | ICD-10-CM | POA: Diagnosis not present

## 2024-06-28 DIAGNOSIS — C770 Secondary and unspecified malignant neoplasm of lymph nodes of head, face and neck: Secondary | ICD-10-CM | POA: Diagnosis not present

## 2024-06-28 LAB — BASIC METABOLIC PANEL - CANCER CENTER ONLY
Anion gap: 4 — ABNORMAL LOW (ref 5–15)
BUN: 17 mg/dL (ref 8–23)
CO2: 28 mmol/L (ref 22–32)
Calcium: 9.5 mg/dL (ref 8.9–10.3)
Chloride: 101 mmol/L (ref 98–111)
Creatinine: 1.1 mg/dL (ref 0.61–1.24)
GFR, Estimated: >60 mL/min (ref >60)
Glucose, Bld: 129 mg/dL — ABNORMAL HIGH (ref 70–99)
Potassium: 3.9 mmol/L (ref 3.5–5.1)
Sodium: 133 mmol/L — ABNORMAL LOW (ref 135–145)

## 2024-06-28 LAB — CBC WITH DIFFERENTIAL (CANCER CENTER ONLY)
Abs Immature Granulocytes: 0.01 K/uL (ref 0.00–0.07)
Basophils Absolute: 0.1 K/uL (ref 0.0–0.1)
Basophils Relative: 1 %
Eosinophils Absolute: 0.1 K/uL (ref 0.0–0.5)
Eosinophils Relative: 1 %
HCT: 38.5 % — ABNORMAL LOW (ref 39.0–52.0)
Hemoglobin: 13.6 g/dL (ref 13.0–17.0)
Immature Granulocytes: 0 %
Lymphocytes Relative: 15 %
Lymphs Abs: 1.1 K/uL (ref 0.7–4.0)
MCH: 31.9 pg (ref 26.0–34.0)
MCHC: 35.3 g/dL (ref 30.0–36.0)
MCV: 90.2 fL (ref 80.0–100.0)
Monocytes Absolute: 0.6 K/uL (ref 0.1–1.0)
Monocytes Relative: 9 %
Neutro Abs: 5.3 K/uL (ref 1.7–7.7)
Neutrophils Relative %: 74 %
Platelet Count: 242 K/uL (ref 150–400)
RBC: 4.27 MIL/uL (ref 4.22–5.81)
RDW: 12.7 % (ref 11.5–15.5)
WBC Count: 7.2 K/uL (ref 4.0–10.5)
nRBC: 0 % (ref 0.0–0.2)

## 2024-06-28 LAB — RAD ONC ARIA SESSION SUMMARY
Course Elapsed Days: 1
Plan Fractions Treated to Date: 2
Plan Prescribed Dose Per Fraction: 2 Gy
Plan Total Fractions Prescribed: 35
Plan Total Prescribed Dose: 70 Gy
Reference Point Dosage Given to Date: 4 Gy
Reference Point Session Dosage Given: 2 Gy
Session Number: 2

## 2024-06-28 LAB — MAGNESIUM: Magnesium: 2 mg/dL (ref 1.7–2.4)

## 2024-06-28 MED ORDER — POTASSIUM CHLORIDE IN NACL 20-0.9 MEQ/L-% IV SOLN
Freq: Once | INTRAVENOUS | Status: AC
  Filled 2024-06-28: qty 1000

## 2024-06-28 MED ORDER — SODIUM CHLORIDE 0.9 % IV SOLN: INTRAVENOUS | Status: DC

## 2024-06-28 MED ORDER — OXYCODONE HCL 5 MG PO TABS
5.0000 mg | ORAL_TABLET | Freq: Four times a day (QID) | ORAL | 0 refills | Status: DC | PRN
  Filled 2024-06-28: qty 90, 23d supply, fill #0

## 2024-06-28 MED ORDER — DEXAMETHASONE SODIUM PHOSPHATE 10 MG/ML IJ SOLN
10.0000 mg | Freq: Once | INTRAMUSCULAR | Status: AC
  Administered 2024-06-28: 10 mg via INTRAVENOUS
  Filled 2024-06-28: qty 1

## 2024-06-28 MED ORDER — PALONOSETRON HCL INJECTION 0.25 MG/5ML
0.2500 mg | Freq: Once | INTRAVENOUS | Status: AC
  Administered 2024-06-28: 0.25 mg via INTRAVENOUS
  Filled 2024-06-28: qty 5

## 2024-06-28 MED ORDER — MAGNESIUM SULFATE 2 GM/50ML IV SOLN
2.0000 g | Freq: Once | INTRAVENOUS | Status: AC
  Administered 2024-06-28: 2 g via INTRAVENOUS
  Filled 2024-06-28: qty 50

## 2024-06-28 MED ORDER — SODIUM CHLORIDE 0.9 % IV SOLN
40.0000 mg/m2 | Freq: Once | INTRAVENOUS | Status: AC
  Administered 2024-06-28: 81 mg via INTRAVENOUS
  Filled 2024-06-28: qty 81

## 2024-06-28 MED ORDER — APREPITANT 130 MG/18ML IV EMUL
130.0000 mg | Freq: Once | INTRAVENOUS | Status: AC
  Administered 2024-06-28: 130 mg via INTRAVENOUS
  Filled 2024-06-28: qty 18

## 2024-06-28 NOTE — Patient Instructions (Signed)
 [facility] - A DEPT OF [facility]  Discharge Instructions: Thank you for choosing [facility] to provide your oncology and hematology care.   If you have a lab appointment with the Cancer Center, please go directly to the Cancer Center and check in at the registration area.   Wear comfortable clothing and clothing appropriate for easy access to any Portacath or PICC line.   We strive to give you quality time with your provider. You may need to reschedule your appointment if you arrive late (15 or more minutes).  Arriving late affects you and other patients whose appointments are after yours.  Also, if you miss three or more appointments without notifying the office, you may be dismissed from the clinic at the provider's discretion.      For prescription refill requests, have your pharmacy contact our office and allow 72 hours for refills to be completed.    Today you received the following chemotherapy and/or immunotherapy agents: Cisplatin      To help prevent nausea and vomiting after your treatment, we encourage you to take your nausea medication as directed.  BELOW ARE SYMPTOMS THAT SHOULD BE REPORTED IMMEDIATELY: *FEVER GREATER THAN 100.4 F (38 C) OR HIGHER *CHILLS OR SWEATING *NAUSEA AND VOMITING THAT IS NOT CONTROLLED WITH YOUR NAUSEA MEDICATION *UNUSUAL SHORTNESS OF BREATH *UNUSUAL BRUISING OR BLEEDING *URINARY PROBLEMS (pain or burning when urinating, or frequent urination) *BOWEL PROBLEMS (unusual diarrhea, constipation, pain near the anus) TENDERNESS IN MOUTH AND THROAT WITH OR WITHOUT PRESENCE OF ULCERS (sore throat, sores in mouth, or a toothache) UNUSUAL RASH, SWELLING OR PAIN  UNUSUAL VAGINAL DISCHARGE OR ITCHING   Items with * indicate a potential emergency and should be followed up as soon as possible or go to the Emergency Department if any problems should occur.  Please show the CHEMOTHERAPY ALERT CARD or IMMUNOTHERAPY  ALERT CARD at check-in to the Emergency Department and triage nurse.  Should you have questions after your visit or need to cancel or reschedule your appointment, please contact [facility] - A DEPT OF [facility]  Dept: [phone]  and follow the prompts.  Office hours are 8:00 a.m. to 4:30 p.m. Monday - Friday. Please note that voicemails left after 4:00 p.m. may not be returned until the following business day.  We are closed weekends and major holidays. You have access to a nurse at all times for urgent questions. Please call the main number to the clinic Dept: [phone] and follow the prompts.   For any non-urgent questions, you may also contact your provider using MyChart. We now offer e-Visits for anyone 65 and older to request care online for non-urgent symptoms. For details visit [email or web address].   Also download the MyChart app! Go to the app store, search "MyChart", open the app, select [facility], and log in with your MyChart username and password.  Cisplatin Injection What is this medication? CISPLATIN (SIS pla tin) treats some types of cancer. It works by slowing down the growth of cancer cells. This medicine may be used for other purposes; ask your health care provider or pharmacist if you have questions. COMMON BRAND NAME(S): Platinol, Platinol -AQ What should I tell my care team before I take this medication? They need to know if you have any of these conditions: Eye disease, vision problems Hearing problems Kidney disease Low blood counts, such as low white cells, platelets, or red  blood cells Tingling of the fingers or toes, or other nerve disorder An unusual or allergic reaction to cisplatin, carboplatin, oxaliplatin, other medications, foods, dyes, or preservatives If you or your partner are pregnant or trying to get pregnant Breast-feeding How should I use this medication? This medication is injected into a vein. It is given  by your care team in a hospital or clinic setting. Talk to your care team about the use of this medication in children. Special care may be needed. Overdosage: If you think you have taken too much of this medicine contact a poison control center or emergency room at once. NOTE: This medicine is only for you. Do not share this medicine with others. What if I miss a dose? Keep appointments for follow-up doses. It is important not to miss your dose. Call your care team if you are unable to keep an appointment. What may interact with this medication? Do not take this medication with any of the following: Live virus vaccines This medication may also interact with the following: Certain antibiotics, such as amikacin, gentamicin, neomycin, polymyxin B, streptomycin, tobramycin, vancomycin Foscarnet This list may not describe all possible interactions. Give your health care provider a list of all the medicines, herbs, non-prescription drugs, or dietary supplements you use. Also tell them if you smoke, drink alcohol, or use illegal drugs. Some items may interact with your medicine. What should I watch for while using this medication? Your condition will be monitored carefully while you are receiving this medication. You may need blood work done while taking this medication. This medication may make you feel generally unwell. This is not uncommon, as chemotherapy can affect healthy cells as well as cancer cells. Report any side effects. Continue your course of treatment even though you feel ill unless your care team tells you to stop. This medication may increase your risk of getting an infection. Call your care team for advice if you get a fever, chills, sore throat, or other symptoms of a cold or flu. Do not treat yourself. Try to avoid being around people who are sick. Avoid taking medications that contain aspirin, acetaminophen, ibuprofen, naproxen, or ketoprofen unless instructed by your care team. These  medications may hide a fever. This medication may increase your risk to bruise or bleed. Call your care team if you notice any unusual bleeding. Be careful brushing or flossing your teeth or using a toothpick because you may get an infection or bleed more easily. If you have any dental work done, tell your dentist you are receiving this medication. Drink fluids as directed while you are taking this medication. This will help protect your kidneys. Call your care team if you get diarrhea. Do not treat yourself. Talk to your care team if you or your partner wish to become pregnant or think you might be pregnant. This medication can cause serious birth defects if taken during pregnancy and for 14 months after the last dose. A negative pregnancy test is required before starting this medication. A reliable form of contraception is recommended while taking this medication and for 14 months after the last dose. Talk to your care team about effective forms of contraception. Do not father a child while taking this medication and for 11 months after the last dose. Use a condom during sex during this time period. Do not breast-feed while taking this medication. This medication may cause infertility. Talk to your care team if you are concerned about your fertility. What side effects may I  notice from receiving this medication? Side effects that you should report to your care team as soon as possible: Allergic reactions--skin rash, itching, hives, swelling of the face, lips, tongue, or throat Eye pain, change in vision, vision loss Hearing loss, ringing in ears Infection--fever, chills, cough, sore throat, wounds that don't heal, pain or trouble when passing urine, general feeling of discomfort or being unwell Kidney injury--decrease in the amount of urine, swelling of the ankles, hands, or feet Low red blood cell level--unusual weakness or fatigue, dizziness, headache, trouble breathing Painful swelling, warmth,  or redness of the skin, blisters or sores at the infusion site Pain, tingling, or numbness in the hands or feet Unusual bruising or bleeding Side effects that usually do not require medical attention (report to your care team if they continue or are bothersome): Hair loss Nausea Vomiting This list may not describe all possible side effects. Call your doctor for medical advice about side effects. You may report side effects to FDA at 1-800-FDA-1088. Where should I keep my medication? This medication is given in a hospital or clinic. It will not be stored at home. NOTE: This sheet is a summary. It may not cover all possible information. If you have questions about this medicine, talk to your doctor, pharmacist, or health care provider.  2024 [employer] Standard (2022-01-09 00:00:00)

## 2024-06-28 NOTE — Progress Notes (Signed)
 [facility]  ONCOLOGY CLINIC PROGRESS NOTE   Patient Care Team: Windy Coy, MD as PCP - General (Family Medicine) Pietro, Redell RAMAN, MD as PCP - Cardiology (Cardiology) Vertell Pont, RN as Oncology Nurse Navigator Renda Glance, MD as Consulting Physician (Urology) Patrcia Cough, MD as Consulting Physician (Radiation Oncology) Starla Wendelyn BIRCH, RN as Registered Nurse Malmfelt, Delon CROME, RN as Oncology Nurse Navigator Izell Domino, MD as Consulting Physician (Radiation Oncology) Autumn Millman, MD as Consulting Physician (Oncology) Lauralee Chew, MD as Referring Physician (Otolaryngology)  PATIENT NAME: Gilbert Thompson   MR#: [id number] DOB: Feb 18, 1951  Date of visit: 06/28/2024   ASSESSMENT & PLAN:   [name] is a 73 y.o. 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  Concern for extranodal extension based on imaging.  Started concurrent chemoradiation with weekly cisplatin from 06/27/2024.  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   CT scan from July 2025 showed lymph nodes with extranodal extension, indicating a higher risk of recurrence without appropriate treatment. The cancer is aggressive due to lymph node involvement but has not spread beyond the head and neck area.   cT2,cN1,cM0,p16+, Stage 1 disease.  Concern for extranodal extension based on imaging.  Previously I discussed staging, prognosis, plan of care, treatment options.  Reviewed NCCN guidelines.  We have discussed about role of cisplatin being a radiosensitizer in the treatment of head and neck cancer.  We have discussed  about the curative intent of chemoradiation for this patient.     We have discussed about mechanism of action of cisplatin, adverse effects of cisplatin including but not limited to fatigue, nausea, vomiting, increased risk of infections, mucositis, ototoxicity, nephrotoxicity, peripheral neuropathy.  Patient understands that some of the side effects can be permanent and even potentially fatal.  We have discussed about role of Mediport and G-tube for chemotherapy administration and nutrition respectively since most of these patients have severe mucositis during the treatment.  At this time we do not know if weekly cisplatin is inferior to every 21 days cisplatin since there is no head-to-head comparison trial.  I did mention to the patient however weekly cisplatin is well-tolerated with less adverse effects and most of the patients tend to complete treatment as planned.  Patient is willing to proceed with weekly cisplatin.  Emphasized the importance of hydration to protect kidney function. The treatment plan includes seven weeks of radiation and weekly chemotherapy, with the possibility of holding chemotherapy towards the end if side effects become problematic. The role of chemotherapy is supportive, making radiation more effective.   Labs today revealed creatinine of 1.1.  CBCD unremarkable.    Proceed with cycle 1 of cisplatin today at standard dosing of 40 mg/m.  He is to continue radiation treatments as scheduled.    We again emphasized the importance of hydration with him.   - Monitor kidney function and electrolytes weekly during treatment - Previously referred to physical therapy, speech language pathology, and nutritionist for supportive care  RTC in 1 week for labs, follow-up and continuation of chemotherapy.  Cancer related pain Experiencing significant pain, particularly in the throat and neck, managed with hydrocodone  5 mg. Reports taking the medication three times  a day, sometimes  twice, and finds it effective but occasionally requires an extra dose at night. - Prescribe hydrocodone  5 mg, 90 tablets - Advise taking an extra dose at night if needed - Monitor pain levels and adjust dosage if consistently requiring extra doses - In the future, we will also prescribe mouthwash to help numb oral pain   Hyponatremia Sodium level is slightly low at 133 mmol/L, likely due to high water  intake. Kidney function is stable with creatinine at 1.1 mg/dL. Advised to replace some water  intake with electrolyte-based drinks to address hyponatremia. - Advise replacing some water  intake with Gatorade or electrolyte-based drinks  I reviewed lab results and outside records for this visit and discussed relevant results with the patient. Diagnosis, plan of care and treatment options were also discussed in detail with the patient. Opportunity provided to ask questions and answers provided to his apparent satisfaction. Provided instructions to call our clinic with any problems, questions or concerns prior to return visit. I recommended to continue follow-up with PCP and sub-specialists. He verbalized understanding and agreed with the plan.   NCCN guidelines have been consulted in the planning of this patient's care.  I spent a total of 40 minutes during this encounter with the patient including review of chart and various tests results, discussions about plan of care and coordination of care plan.   [name], MD  06/28/2024 3:56 PM  [facility] - A DEPT OF [facility] [address] [place] [address] Dept: [phone] Dept Fax: [phone]    CHIEF COMPLAINT/ REASON FOR VISIT:   Squamous cell carcinoma of the left tonsil/base of tongue. cT2,cN1,cM0,p16+, Stage 1 disease.  Concern for extranodal extension based on imaging.    Current Treatment:  Started concurrent chemoradiation with weekly cisplatin from  06/27/2024.  INTERVAL HISTORY:    Discussed the use of AI scribe software for clinical note transcription with the patient, who gave verbal consent to proceed.  History of Present Illness CLEAVEN DEMARIO is a 73 year old male who presents for pain management and follow-up after starting radiation therapy.  He is experiencing significant throat and neck pain, described as 'like on fire' and 'stiff', particularly at night. He is taking hydrocodone  5 mg, prescribed three times a day, sometimes twice, and finds it generally effective. He is almost out of the medication and may need an increased dose in the future.  He started radiation therapy yesterday and had another session this morning. He attributes some of his throat pain to the radiation treatment. He is performing swallowing exercises regularly, which he finds helpful in managing his symptoms.  He is staying hydrated, drinking over 100 ounces of water  daily, and has noticed improvements in his lab numbers.  He is considering resuming physical activities such as golf and pushups. He is scheduled to play golf on Friday.   I have reviewed the past medical history, past surgical history, social history and family history with the patient and they are unchanged from previous note.  HISTORY OF PRESENT ILLNESS:   ONCOLOGY HISTORY:   He presented to his PCP with complains of chronic pharyngitis and a left submandibular mass that has persisted over the past year.   To further investigate his symptoms, he underwent a CT neck and CT chest on 04/06/24 showing a 3.0 x 2.4 x 2.5 cm soft tissue mass centered in the left oropharynx at the glossotonsillar sulcus with possible  involvement of the adjacent tongue base. 7 mm low-attenuation left level IB lymph node likely reflecting a site of nodal metastatic disease. 1.7 x 1.7 cm irregular soft tissue focus at the left level II station, inseparable from the adjacent sternocleidomastoid muscle. This is  suspicious for nodal metastatic disease (with findings concerning for extracapsular extension) or a tumor deposit. 9 mm short-axis low attenuation left level III lymph node likely reflecting a site of nodal metastatic disease. Scans also noted scattered tiny pulmonary nodules are unchanged and likely benign but with only 5 months of documented stability but with recommend attention on follow-up given his history of prostate cancer.    Subsequently, the patient was referred to Dr. Graig on 04/24/24 who performed a FNA biopsy of left neck. However, biopsy was inconclusive. As a result, he then underwent a laryngoscopy with biopsy on 05/15/24 under the care of Dr. Lauralee. 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 Tumor is moderately differentiated with foci of poor differentiation and invasion into the striated muscle. Immunohistochemical studies are p16, p40, and HRHPV are positive.    He had a cervical spine MRI performed on 05/09/24 revealing no evidence of metastatic disease to the cervical spine itself but did indicated degenerative spondylosis at C4-5 and C5-6. 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 PET scan performed on 04/26/24 showing FDG avid mass along the left tongue base/oropharynx with left cervical chain nodal metastatic disease with no suspicious pulmonary or mediastinal FDG uptake.   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    Prior cancers, if any: history of prostate cancer.   Given extracapsular extension with lymph node positive disease, plan made to proceed with concurrent chemoradiation.   Plan made for weekly cisplatin with radiation, if renal function allows.  Otherwise we will use either docetaxel or carboplatin/paclitaxel regimen.  Started concurrent chemoradiation with weekly cisplatin from  06/27/2024.  Oncology History  Malignant neoplasm of prostate (HCC)  01/19/2023 Cancer Staging   Staging form: Prostate, AJCC 8th Edition - Clinical stage from 01/19/2023: Stage IIC (cT2a, cN0, cM0, PSA: 6, Grade Group: 4) - Signed by Sherwood Rise, PA-C on 03/30/2023 Histopathologic type: Adenocarcinoma, NOS Stage prefix: Initial diagnosis Prostate specific antigen (PSA) range: Less than 10 Gleason primary pattern: 4 Gleason secondary pattern: 4 Gleason score: 8 Histologic grading system: 5 grade system Number of biopsy cores examined: 14 Number of biopsy cores positive: 5 Location of positive needle core biopsies: One side   02/24/2023 Initial Diagnosis   Malignant neoplasm of prostate (HCC)   Primary squamous cell carcinoma of oropharynx (HCC)  06/07/2024 Initial Diagnosis   Malignant neoplasm of base of tongue (HCC)   06/07/2024 Cancer Staging   Staging form: Pharynx - HPV-Mediated Oropharynx, AJCC 8th Edition - Clinical stage from 06/07/2024: Stage I (cT2, cN1, cM0, p16+) - Signed by Izell Domino, MD on 06/07/2024 Stage prefix: Initial diagnosis   06/28/2024 -  Chemotherapy   Patient is on Treatment Plan : HEAD/NECK Cisplatin (40) q7d         REVIEW OF SYSTEMS:   Review of Systems - Oncology  All other pertinent systems were reviewed with the patient and are negative.  ALLERGIES: He has no known allergies.  MEDICATIONS:  Current Outpatient Medications  Medication Sig Dispense Refill   albuterol  (VENTOLIN  HFA) 108 (90 Base) MCG/ACT inhaler Inhale 1-2  puffs into the lungs every 6 (six) hours as needed for wheezing or shortness of breath.     allopurinol  (ZYLOPRIM ) 300 MG tablet Take 300 mg by mouth daily.     ALPRAZolam  (XANAX ) 1 MG tablet Take 1 mg by mouth at bedtime as needed for anxiety or sleep.     aspirin  EC 81 MG tablet Take 81 mg by mouth daily. Swallow whole.     celecoxib (CELEBREX) 200 MG capsule      cholecalciferol (VITAMIN D3) 25 MCG (1000 UNIT) tablet  Take 1,000 Units by mouth daily.     dexamethasone  (DECADRON ) 4 MG tablet Take 2 tablets (8 mg) by mouth daily x 3 days starting the day after cisplatin chemotherapy. Take with food. 30 tablet 1   ezetimibe  (ZETIA ) 10 MG tablet TAKE 1 TABLET BY MOUTH DAILY 90 tablet 3   finasteride  (PROSCAR ) 5 MG tablet Take 1.25 mg by mouth daily.     HYDROcodone -acetaminophen  (NORCO/VICODIN) 5-325 MG tablet Take 1 tablet by mouth every 6 (six) hours as needed for severe pain.     ibuprofen (ADVIL) 200 MG tablet Take 600 mg by mouth daily as needed for moderate pain (pain score 4-6).     lidocaine -prilocaine  (EMLA ) cream Apply to affected area once 30 g 3   losartan  (COZAAR ) 50 MG tablet Take 1 tablet (50 mg total) by mouth daily. 90 tablet 3   metaxalone (SKELAXIN) 800 MG tablet Take 400 mg by mouth 3 (three) times daily as needed for muscle spasms.     nitroGLYCERIN  (NITROSTAT ) 0.4 MG SL tablet Place 1 tablet (0.4 mg total) under the tongue every 5 (five) minutes as needed for chest pain. 90 tablet 3   ondansetron  (ZOFRAN ) 8 MG tablet Take 1 tablet (8 mg total) by mouth every 8 (eight) hours as needed for nausea or vomiting. Start on the third day after cisplatin. 30 tablet 1   pantoprazole  (PROTONIX ) 40 MG tablet Take 1 tablet (40 mg total) by mouth 2 (two) times daily. 180 tablet 3   polyethylene glycol (MIRALAX / GLYCOLAX) 17 g packet Take 17 g by mouth daily as needed for moderate constipation.     prochlorperazine  (COMPAZINE ) 10 MG tablet Take 1 tablet (10 mg total) by mouth every 6 (six) hours as needed (Nausea or vomiting). 30 tablet 1   rosuvastatin  (CRESTOR ) 40 MG tablet Take 1 tablet (40 mg total) by mouth daily. 90 tablet 3   Tetrahydrozoline HCl (REDNESS RELIEVER EYE DROPS OP) Place 1 drop into both eyes daily as needed (redness).     traMADol  (ULTRAM ) 50 MG tablet Take 1 tablet (50 mg total) by mouth every 6 (six) hours as needed. 20 tablet 0   oxyCODONE  (OXY IR/ROXICODONE ) 5 MG immediate release  tablet Take 1 tablet (5 mg total) by mouth every 6 (six) hours as needed. 90 tablet 0   TRELEGY ELLIPTA 100-62.5-25 MCG/ACT AEPB Inhale 1 puff into the lungs daily. (Patient not taking: Reported on 06/28/2024)     No current facility-administered medications for this visit.   Facility-Administered Medications Ordered in Other Visits  Medication Dose Route Frequency Provider Last Rate Last Admin   0.9 %  sodium chloride  infusion   Intravenous Continuous Arista Kettlewell, MD   Stopped at 06/28/24 1542     VITALS:   Blood pressure (!) 116/59, pulse (!) 51, temperature (!) 97.3 F (36.3 C), temperature source Temporal, resp. rate 16, height 5' 9 (1.753 m), weight 183 lb (83 kg), SpO2 100%.  Wt Readings from Last 3 Encounters:  06/28/24 183 lb (83 kg)  06/19/24 185 lb 6.4 oz (84.1 kg)  06/07/24 184 lb 6.4 oz (83.6 kg)    Body mass index is 27.02 kg/m.   Onc Performance Status - 06/28/24 0919       ECOG Perf Status   ECOG Perf Status Restricted in physically strenuous activity but ambulatory and able to carry out work of a light or sedentary nature, e.g., light house work, office work      KPS SCALE   KPS % SCORE Able to carry on normal activity, minor s/s of disease          PHYSICAL EXAM:   Physical Exam Constitutional:      General: He is not in acute distress.    Appearance: Normal appearance.  HENT:     Head: Normocephalic and atraumatic.     Mouth/Throat:     Comments:  Abnormal tissue noted in the left glossotonsillar sulcus Eyes:     Conjunctiva/sclera: Conjunctivae normal.  Neck:     Comments:  2 cm, relatively firm mass at the angle of left jaw Cardiovascular:     Rate and Rhythm: Normal rate and regular rhythm.  Pulmonary:     Effort: Pulmonary effort is normal. No respiratory distress.  Chest:     Comments: Right-sided Port-A-Cath in place without any signs of infection Abdominal:     General: There is no distension.     Comments: Feeding tube in place   Neurological:     General: No focal deficit present.     Mental Status: He is alert and oriented to person, place, and time.  Psychiatric:        Mood and Affect: Mood normal.        Behavior: Behavior normal.       LABORATORY DATA:   I have reviewed the data as listed.  Results for orders placed or performed in visit on 06/28/24  Magnesium   Result Value Ref Range   Magnesium  2.0 1.7 - 2.4 mg/dL  Basic Metabolic Panel - Cancer Center Only  Result Value Ref Range   Sodium 133 (L) 135 - 145 mmol/L   Potassium 3.9 3.5 - 5.1 mmol/L   Chloride 101 98 - 111 mmol/L   CO2 28 22 - 32 mmol/L   Glucose, Bld 129 (H) 70 - 99 mg/dL   BUN 17 8 - 23 mg/dL   Creatinine 8.89 9.38 - 1.24 mg/dL   Calcium  9.5 8.9 - 10.3 mg/dL   GFR, Estimated >39 >39 mL/min   Anion gap 4 (L) 5 - 15  CBC with Differential (Cancer Center Only)  Result Value Ref Range   WBC Count 7.2 4.0 - 10.5 K/uL   RBC 4.27 4.22 - 5.81 MIL/uL   Hemoglobin 13.6 13.0 - 17.0 g/dL   HCT 61.4 (L) 60.9 - 47.9 %   MCV 90.2 80.0 - 100.0 fL   MCH 31.9 26.0 - 34.0 pg   MCHC 35.3 30.0 - 36.0 g/dL   RDW 87.2 88.4 - 84.4 %   Platelet Count 242 150 - 400 K/uL   nRBC 0.0 0.0 - 0.2 %   Neutrophils Relative % 74 %   Neutro Abs 5.3 1.7 - 7.7 K/uL   Lymphocytes Relative 15 %   Lymphs Abs 1.1 0.7 - 4.0 K/uL   Monocytes Relative 9 %   Monocytes Absolute 0.6 0.1 - 1.0 K/uL   Eosinophils Relative 1 %   Eosinophils Absolute  0.1 0.0 - 0.5 K/uL   Basophils Relative 1 %   Basophils Absolute 0.1 0.0 - 0.1 K/uL   Immature Granulocytes 0 %   Abs Immature Granulocytes 0.01 0.00 - 0.07 K/uL  Results for orders placed or performed in visit on 06/28/24  Rad Onc Aria Session Summary  Result Value Ref Range   Course ID [id number]    Course Start Date 06/19/2024    Session Number 2    Course First Treatment Date 06/27/2024  2:31 PM    Course Last Treatment Date 06/28/2024  8:02 AM    Course Elapsed Days 1    Reference Point ID HN_Oroph dp     Reference Point Dosage Given to Date 4 Gy   Reference Point Session Dosage Given 2 Gy   Plan ID HN_Oroph    Plan Name HN_Oroph    Plan Fractions Treated to Date 2    Plan Total Fractions Prescribed 35    Plan Prescribed Dose Per Fraction 2 Gy   Plan Total Prescribed Dose 70.000000 Gy   Plan Primary Reference Point HN_Oroph dp       RADIOGRAPHIC STUDIES:  I have personally reviewed the radiological images as listed and agree with the findings in the report.  IR GASTROSTOMY TUBE MOD SED Result Date: 06/23/2024 INDICATION: he will receive chemo/radiation for head and neck cancer EXAM: Procedures: 1. FLUOROSCOPIC NASOGASTRIC TUBE PLACEMENT 2. PERCUTANEOUS GASTROSTOMY FEEDING TUBE PLACEMENT COMPARISON:  PET-CT, 04/26/2024 and 02/22/2023. CT chest, 04/06/2024 MEDICATIONS: Ancef  2 gm IV; Antibiotics were administered within 1 hour of the procedure. Glucagon 1 mg IV CONTRAST:  15 mL of Isovue  300 administered into the gastric lumen. ANESTHESIA/SEDATION: Moderate (conscious) sedation was employed during this procedure. A total of Versed  1 mg and Fentanyl  50 mcg was administered intravenously. Moderate Sedation Time: 15 minutes. The patient's level of consciousness and vital signs were monitored continuously by radiology nursing throughout the procedure under my direct supervision. FLUOROSCOPY: Radiation Exposure Index and estimated peak skin dose (PSD); Reference air kerma (RAK), 7 mGy. COMPLICATIONS: None immediate. PROCEDURE: Informed written consent was obtained from the patient and/or patient's representative following explanation of the procedure, risks, benefits and alternatives. A time out was performed prior to the initiation of the procedure. Maximal barrier sterile technique utilized including caps, mask, sterile gowns, sterile gloves, large sterile drape, hand hygiene and sterile prep. The LEFT upper quadrant was sterilely prepped and draped. A oral gastric catheter was inserted into the stomach  under fluoroscopy. The LEFT costal margin and barium opacified transverse colon were identified and avoided. Air was injected into the stomach for insufflation and visualization under fluoroscopy. Under sterile conditions and local anesthesia, 2 T tacks were utilized to pexy the anterior aspect of the stomach against the ventral abdominal wall. Contrast injection confirmed appropriate positioning of each of the T tacks. An incision was made between the T tacks and a 17 gauge trocar needle was utilized to access the stomach. Needle position was confirmed within the stomach with aspiration of air and injection of a small amount of contrast. A stiff guidewire was advanced into the gastric lumen and under intermittent fluoroscopic guidance, the access needle was exchanged for a telescoping peel-away sheath, ultimately allowing placement of a 14 Fr balloon retention gastrostomy tube. The retention balloon was insufflated with a mixture of dilute saline and contrast and pulled taut against the anterior wall of the stomach. The external disc was cinched. Contrast injection confirms positioning within the stomach. Several spot radiographic images were  obtained in various obliquities for documentation. The patient tolerated procedure well without immediate post procedural complication. FINDINGS: After successful fluoroscopic guided placement, the gastrostomy tube is appropriately positioned with internal retention balloon against the ventral aspect of the gastric lumen. IMPRESSION: Successful fluoroscopic insertion of a 14 Fr balloon retention gastrostomy tube. The gastrostomy may be used immediately for medication administration and in 4 hrs for the initiation of feeds. RECOMMENDATIONS: The patient will return to Vascular Interventional Radiology (VIR) for routine feeding tube evaluation and exchange in 6 months. Thom Hall, MD [facility] Radiology Electronically Signed    By: Thom Hall M.D.   On: 06/23/2024 14:41   IR IMAGING GUIDED PORT INSERTION Result Date: 06/23/2024 INDICATION: he will receive chemo/radiation for head and neck cancer EXAM: IMPLANTED PORT A CATH PLACEMENT WITH ULTRASOUND AND FLUOROSCOPIC GUIDANCE MEDICATIONS: None ANESTHESIA/SEDATION: Moderate (conscious) sedation was employed during this procedure. A total of Versed  3 mg and Fentanyl  150 mcg was administered intravenously. Moderate Sedation Time: 20 minutes. The patient's level of consciousness and vital signs were monitored continuously by radiology nursing throughout the procedure under my direct supervision. FLUOROSCOPY: Radiation Exposure Index and estimated peak skin dose (PSD); Reference air kerma (RAK), 0 mGy. COMPLICATIONS: None immediate. PROCEDURE: The procedure, risks, benefits, and alternatives were explained to the patient. Questions regarding the procedure were encouraged and answered. The patient understands and consents to the procedure. The RIGHT neck and chest were prepped with chlorhexidine  in a sterile fashion, and a sterile drape was applied covering the operative field. Maximum barrier sterile technique with sterile gowns and gloves were used for the procedure. A timeout was performed prior to the initiation of the procedure. Local anesthesia was provided with 1% lidocaine  with epinephrine . After creating a small venotomy incision, a micropuncture kit was utilized to access the internal jugular vein under direct, real-time ultrasound guidance. Ultrasound image documentation was performed. The microwire was kinked to measure appropriate catheter length. A subcutaneous port pocket was then created along the upper chest wall utilizing a combination of sharp and blunt dissection. The pocket was irrigated with sterile saline. A single lumen power injectable port was chosen for placement. The 8 Fr catheter was tunneled from the port pocket site to the venotomy incision. The port was placed  in the pocket. The external catheter was trimmed to appropriate length. At the venotomy, an 8 Fr peel-away sheath was placed over a guidewire under fluoroscopic guidance. The catheter was then placed through the sheath and the sheath was removed. Final catheter positioning was confirmed and documented with a fluoroscopic spot radiograph. The port was accessed with a Huber needle, aspirated and flushed with heparinized saline. The port pocket incision was closed with interrupted 3-0 Vicryl suture then Dermabond was applied, including at the venotomy incision. Dressings were placed. The patient tolerated the procedure well without immediate post procedural complication. IMPRESSION: Successful placement of a RIGHT internal jugular approach power injectable Port-A-Cath. The tip of the catheter is positioned within the proximal RIGHT atrium. The catheter is ready for immediate use. Thom Hall, MD [facility] Electronically Signed   By: Thom Hall M.D.   On: 06/23/2024 13:41     CODE STATUS:  Code Status History     Date Active Date Inactive Code Status Order ID Comments User Context   06/23/2024 1257 06/24/2024 0510 Full Code [id number]  Hall Thom, MD [facility]   06/23/2024 1257 06/23/2024 1257 Full Code [id number]  Hall Thom, MD  [facility]   04/29/2023 1830 04/30/2023 1811 Full Code [id number]  Renda Glance, MD Inpatient   06/05/2022 0846 06/05/2022 1634 Full Code [id number]  Claudene Victory ORN, MD Inpatient    Questions for Most Recent Historical Code Status (Order [id number])     Question Answer   By: Consent: discussion documented in EHR            Orders Placed This Encounter  Procedures   CBC with Differential (Cancer Center Only)    Standing Status:   Future    Expected Date:   07/12/2024    Expiration Date:   07/12/2025   Basic Metabolic Panel - Cancer Center Only    Standing Status:   Future    Expected Date:   07/12/2024    Expiration Date:    07/12/2025   Magnesium     Standing Status:   Future    Expected Date:   07/12/2024    Expiration Date:   07/12/2025   CBC with Differential (Cancer Center Only)    Standing Status:   Future    Expected Date:   07/19/2024    Expiration Date:   07/19/2025   Basic Metabolic Panel - Cancer Center Only    Standing Status:   Future    Expected Date:   07/19/2024    Expiration Date:   07/19/2025   Magnesium     Standing Status:   Future    Expected Date:   07/19/2024    Expiration Date:   07/19/2025   CBC with Differential (Cancer Center Only)    Standing Status:   Future    Expected Date:   07/26/2024    Expiration Date:   07/26/2025   Basic Metabolic Panel - Cancer Center Only    Standing Status:   Future    Expected Date:   07/26/2024    Expiration Date:   07/26/2025   Magnesium     Standing Status:   Future    Expected Date:   07/26/2024    Expiration Date:   07/26/2025   CBC with Differential (Cancer Center Only)    Standing Status:   Future    Expected Date:   08/02/2024    Expiration Date:   08/02/2025   Basic Metabolic Panel - Cancer Center Only    Standing Status:   Future    Expected Date:   08/02/2024    Expiration Date:   08/02/2025   Magnesium     Standing Status:   Future    Expected Date:   08/02/2024    Expiration Date:   08/02/2025   CBC with Differential (Cancer Center Only)    Standing Status:   Future    Expected Date:   08/09/2024    Expiration Date:   08/09/2025   Basic Metabolic Panel - Cancer Center Only    Standing Status:   Future    Expected Date:   08/09/2024    Expiration Date:   08/09/2025   Magnesium     Standing Status:   Future    Expected Date:   08/09/2024    Expiration Date:   08/09/2025     Future Appointments  Date Time Provider Department Center  06/29/2024 12:15 PM [facility] LINAC 4 [facility] None  06/30/2024  7:30 AM [facility] [id number] [facility] None  07/03/2024  1:30 PM [facility] LINAC 3 [facility] None  07/03/2024  1:45  PM LINAC-SQUIRE [facility] None  07/04/2024  1:00 PM [facility] LINAC 3 [facility] None  07/05/2024  7:30 AM [facility] [id number]  [facility] None  07/05/2024  8:30 AM [facility] FLUSH [facility] None  07/05/2024  9:00 AM Viviana Trimble, MD [facility] None  07/05/2024  9:30 AM [facility] INFUSION [facility] None  07/06/2024  9:45 AM [facility] [id number] [facility] None  07/07/2024 11:00 AM [facility] LINAC 3 [facility] None  07/10/2024 11:45 AM [facility] [id number] [facility] None  07/11/2024  9:45 AM [facility] [id number] [facility] None  07/12/2024  9:45 AM [facility] LINAC 3 [facility] None  07/13/2024  9:45 AM [facility] [id number] [facility] None  07/14/2024  9:45 AM [facility] [id number] [facility] None  07/17/2024  9:45 AM [facility] [id number] [facility] None  07/18/2024  9:45 AM [facility] [id number] [facility] None  07/19/2024  9:45 AM [facility] LINAC 3 [facility] None  07/20/2024  9:45 AM [facility] LINAC 3 [facility] None  07/21/2024  9:45 AM [facility] LINAC 3 [facility] None  07/24/2024 10:00 AM [facility] LINAC 3 [facility] None  07/25/2024 10:00 AM [facility] LINAC 3 [facility] None  07/26/2024 10:00 AM [facility] LINAC 3 [facility] None  07/27/2024 10:00 AM [facility] LINAC 3 [facility] None  07/28/2024 10:00 AM [facility] LINAC 3 [facility] None  07/31/2024 10:00 AM [facility] LINAC 3 [facility] None  08/01/2024 10:00 AM [facility] LINAC 3 [facility] None  08/02/2024 10:00 AM [facility] LINAC 3 [facility] None  08/03/2024 10:00 AM [facility] LINAC 3 [facility] None  08/04/2024 10:00 AM [facility] LINAC 3 [facility] None  08/07/2024 10:00 AM [facility] LINAC 3 [facility] None  08/08/2024 10:00 AM [facility] LINAC 3 [facility] None  08/09/2024 10:00 AM [facility] LINAC 3 [facility] None  08/10/2024 10:00 AM [facility] LINAC 3 [facility] None  08/11/2024 10:00 AM [facility] LINAC 3 [facility] None  08/14/2024 10:00  AM [facility] LINAC 3 [facility] None  09/06/2024 10:00 AM Breedlove Blue, Blaire L, PT [facility] None  12/22/2024  3:30 PM [facility] 1 [facility] [place]      This document was completed utilizing speech recognition software. Grammatical errors, random word insertions, pronoun errors, and incomplete sentences are an occasional consequence of this system due to software limitations, ambient noise, and hardware issues. Any formal questions or concerns about the content, text or information contained within the body of this dictation should be directly addressed to the provider for clarification.

## 2024-06-28 NOTE — Progress Notes (Signed)
 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  He is receiving concurrent chemoradiation with weekly cisplatin (start RT 10/7). Patient is under the care of Dr. Izell and Dr. Autumn.   S/p PEG - 10/3  Met with patient and wife in infusion. Patient is doing well. Denies NIS at this time. Mild abdominal soreness from tube placement. Patient disappointed IR MD advised not to play golf this week. He is flushing with 60 ml water  daily. Patient hoping not to use tube. He has a good appetite, eating several small meals. Recalls a variety of foods (eggs, egg salad, soups, spaghetti, pimento chz, dumplings, split pea soup with bratwurst). Patient drinking 80 ounces or more of water . He has not tried ONS previously. Wife asking about protein powders.    Nutrition Focused Physical Exam: deferred    Medications: zyloprim , xanax , celebrex, D3, decadron , zetia , proscar , norco, cozaar , skelaxin, zofran , compazine , protonix , miralax, crestor , tramadol    Labs: Na 133   Anthropometrics: Weights stable 183-186 lb last 9 months  Height: 5'9 Weight: 183 lb  UBW: 192 lb (2024) BMI: 27.02   Estimated Energy Needs  Kcals: 2160-2330 Protein: 99-116 Fluid: >2.1 L   NUTRITION DIAGNOSIS: Predicted sub-optimal intake related to [facility] and associated treatment side effects as evidenced by s/p PEG for anticipated dry mouth, taste changes, sore throat, pain with swallow to affect ability to eat and drink    INTERVENTION:  Educated on importance of increased calorie/protein energy needs to maintain strength/weights during treatment Continue small frequent meals, encourage high calorie high protein foods - handout with soft moist high protein + snack ideas Recommend starting baking soda salt water  gargles several times daily - handout for taste changes with recipe provided Samples of Ensure Complete, CIB powder provided for pt to try   Educated on support services available at [facility] - monthly calendar provided  Contact information   MONITORING, EVALUATION, GOAL: Pt will tolerate adequate calories and protein to minimize wt loss during treatment    Next Visit: Weekly with treatment

## 2024-06-28 NOTE — Assessment & Plan Note (Addendum)
 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   CT scan from July 2025 showed lymph nodes with extranodal extension, indicating a higher risk of recurrence without appropriate treatment. The cancer is aggressive due to lymph node involvement but has not spread beyond the head and neck area.   cT2,cN1,cM0,p16+, Stage 1 disease.  Concern for extranodal extension based on imaging.  Previously I discussed staging, prognosis, plan of care, treatment options.  Reviewed NCCN guidelines.  We have discussed about role of cisplatin being a radiosensitizer in the treatment of head and neck cancer.  We have discussed about the curative intent of chemoradiation for this patient.     We have discussed about mechanism of action of cisplatin, adverse effects of cisplatin including but not limited to fatigue, nausea, vomiting, increased risk of infections, mucositis, ototoxicity, nephrotoxicity, peripheral neuropathy.  Patient understands that some of the side effects can be permanent and even potentially fatal.  We have discussed about role of Mediport and G-tube for chemotherapy administration and nutrition respectively since most of these patients have severe mucositis during the treatment.  At this time we do not know if weekly cisplatin is inferior to every 21 days cisplatin since there is no head-to-head comparison trial.  I did mention to the patient however weekly cisplatin is well-tolerated with less adverse effects and most of the patients tend to complete treatment as planned.  Patient is willing to proceed with weekly cisplatin.  Emphasized the importance of hydration to protect kidney function. The treatment plan includes seven weeks of radiation and weekly chemotherapy, with the possibility of holding chemotherapy towards the end if side effects become problematic. The role of chemotherapy is supportive, making  radiation more effective.   Labs today revealed creatinine of 1.1.  CBCD unremarkable.    Proceed with cycle 1 of cisplatin today at standard dosing of 40 mg/m.  He is to continue radiation treatments as scheduled.    We again emphasized the importance of hydration with him.   - Monitor kidney function and electrolytes weekly during treatment - Previously referred to physical therapy, speech language pathology, and nutritionist for supportive care  RTC in 1 week for labs, follow-up and continuation of chemotherapy.

## 2024-06-28 NOTE — Assessment & Plan Note (Addendum)
 Experiencing significant pain, particularly in the throat and neck, managed with hydrocodone  5 mg. Reports taking the medication three times a day, sometimes twice, and finds it effective but occasionally requires an extra dose at night. - Prescribe hydrocodone  5 mg, 90 tablets - Advise taking an extra dose at night if needed - Monitor pain levels and adjust dosage if consistently requiring extra doses - In the future, we will also prescribe mouthwash to help numb oral pain

## 2024-06-28 NOTE — Progress Notes (Signed)
 [facility] CSW Progress Note  Visual merchandiser met with patient briefly during first treatment. Patient reported being in good spirits no emotional or needs at this time. CSW will follow up with patient during week 4 of treatment.     Lizbeth Sprague, LCSW Clinical Social Worker [facility]

## 2024-06-29 ENCOUNTER — Other Ambulatory Visit: Payer: Self-pay

## 2024-06-29 ENCOUNTER — Ambulatory Visit
Admission: RE | Admit: 2024-06-29 | Discharge: 2024-06-29 | Disposition: A | Source: Ambulatory Visit | Attending: Radiation Oncology

## 2024-06-29 ENCOUNTER — Telehealth: Payer: Self-pay

## 2024-06-29 DIAGNOSIS — Z510 Encounter for antineoplastic radiation therapy: Secondary | ICD-10-CM | POA: Diagnosis not present

## 2024-06-29 LAB — RAD ONC ARIA SESSION SUMMARY
Course Elapsed Days: 2
Plan Fractions Treated to Date: 3
Plan Prescribed Dose Per Fraction: 2 Gy
Plan Total Fractions Prescribed: 35
Plan Total Prescribed Dose: 70 Gy
Reference Point Dosage Given to Date: 6 Gy
Reference Point Session Dosage Given: 2 Gy
Session Number: 3

## 2024-06-29 NOTE — Telephone Encounter (Signed)
 LM for patient that this nurse was calling to see how they were doing after their treatment. Please call back to Dr. Zenda Alpers nurse at [phone] if they have any questions or concerns regarding the treatment.

## 2024-06-29 NOTE — Telephone Encounter (Signed)
-----   Message from Nurse Ileana HERO sent at 06/28/2024  3:50 PM EDT ----- Regarding: First time Cisplatin pt of Pas[place] First time cisplatin pt of pasam, tolerated well. Please follow up when possible, thanks!

## 2024-06-30 ENCOUNTER — Other Ambulatory Visit: Payer: Self-pay

## 2024-06-30 ENCOUNTER — Ambulatory Visit
Admission: RE | Admit: 2024-06-30 | Discharge: 2024-06-30 | Disposition: A | Discharge status: Home / Self Care | Source: Ambulatory Visit | Attending: Radiation Oncology | Admitting: Radiation Oncology

## 2024-06-30 DIAGNOSIS — Z510 Encounter for antineoplastic radiation therapy: Secondary | ICD-10-CM | POA: Diagnosis not present

## 2024-06-30 LAB — RAD ONC ARIA SESSION SUMMARY
Course Elapsed Days: 3
Plan Fractions Treated to Date: 4
Plan Prescribed Dose Per Fraction: 2 Gy
Plan Total Fractions Prescribed: 35
Plan Total Prescribed Dose: 70 Gy
Reference Point Dosage Given to Date: 8 Gy
Reference Point Session Dosage Given: 2 Gy
Session Number: 4

## 2024-07-03 ENCOUNTER — Ambulatory Visit
Admission: RE | Admit: 2024-07-03 | Discharge: 2024-07-03 | Disposition: A | Discharge status: Home / Self Care | Source: Ambulatory Visit | Attending: Radiation Oncology | Admitting: Radiation Oncology

## 2024-07-03 ENCOUNTER — Other Ambulatory Visit: Payer: Self-pay | Admitting: Radiation Oncology

## 2024-07-03 ENCOUNTER — Other Ambulatory Visit: Payer: Self-pay

## 2024-07-03 DIAGNOSIS — Z510 Encounter for antineoplastic radiation therapy: Secondary | ICD-10-CM | POA: Diagnosis not present

## 2024-07-03 DIAGNOSIS — C109 Malignant neoplasm of oropharynx, unspecified: Secondary | ICD-10-CM

## 2024-07-03 DIAGNOSIS — C090 Malignant neoplasm of tonsillar fossa: Secondary | ICD-10-CM | POA: Diagnosis not present

## 2024-07-03 LAB — RAD ONC ARIA SESSION SUMMARY
Course Elapsed Days: 6
Plan Fractions Treated to Date: 5
Plan Prescribed Dose Per Fraction: 2 Gy
Plan Total Fractions Prescribed: 35
Plan Total Prescribed Dose: 70 Gy
Reference Point Dosage Given to Date: 10 Gy
Reference Point Session Dosage Given: 2 Gy
Session Number: 5

## 2024-07-03 MED ORDER — LIDOCAINE VISCOUS HCL 2 % MT SOLN: OROMUCOSAL | 3 refills | Status: AC

## 2024-07-03 MED ORDER — SONAFINE EX EMUL
1.0000 | Freq: Once | CUTANEOUS | Status: AC
  Administered 2024-07-03: 1 via TOPICAL

## 2024-07-04 ENCOUNTER — Other Ambulatory Visit: Payer: Self-pay

## 2024-07-04 ENCOUNTER — Ambulatory Visit
Admission: RE | Admit: 2024-07-04 | Discharge: 2024-07-04 | Disposition: A | Source: Ambulatory Visit | Attending: Radiation Oncology

## 2024-07-04 DIAGNOSIS — Z510 Encounter for antineoplastic radiation therapy: Secondary | ICD-10-CM | POA: Diagnosis not present

## 2024-07-04 LAB — RAD ONC ARIA SESSION SUMMARY
Course Elapsed Days: 7
Plan Fractions Treated to Date: 6
Plan Prescribed Dose Per Fraction: 2 Gy
Plan Total Fractions Prescribed: 35
Plan Total Prescribed Dose: 70 Gy
Reference Point Dosage Given to Date: 12 Gy
Reference Point Session Dosage Given: 2 Gy
Session Number: 6

## 2024-07-05 ENCOUNTER — Encounter: Payer: Self-pay | Admitting: Oncology

## 2024-07-05 ENCOUNTER — Inpatient Hospital Stay: Admitting: Oncology

## 2024-07-05 ENCOUNTER — Other Ambulatory Visit: Payer: Self-pay

## 2024-07-05 ENCOUNTER — Inpatient Hospital Stay: Admitting: Nutrition

## 2024-07-05 ENCOUNTER — Inpatient Hospital Stay

## 2024-07-05 ENCOUNTER — Ambulatory Visit
Admission: RE | Admit: 2024-07-05 | Discharge: 2024-07-05 | Disposition: A | Source: Ambulatory Visit | Attending: Radiation Oncology

## 2024-07-05 VITALS — BP 113/59 | HR 73 | Temp 97.4°F | Resp 18 | Ht 69.0 in | Wt 179.5 lb

## 2024-07-05 DIAGNOSIS — Z510 Encounter for antineoplastic radiation therapy: Secondary | ICD-10-CM | POA: Diagnosis not present

## 2024-07-05 DIAGNOSIS — C109 Malignant neoplasm of oropharynx, unspecified: Secondary | ICD-10-CM

## 2024-07-05 DIAGNOSIS — Z5111 Encounter for antineoplastic chemotherapy: Secondary | ICD-10-CM | POA: Diagnosis not present

## 2024-07-05 LAB — RAD ONC ARIA SESSION SUMMARY
Course Elapsed Days: 8
Plan Fractions Treated to Date: 7
Plan Prescribed Dose Per Fraction: 2 Gy
Plan Total Fractions Prescribed: 35
Plan Total Prescribed Dose: 70 Gy
Reference Point Dosage Given to Date: 14 Gy
Reference Point Session Dosage Given: 2 Gy
Session Number: 7

## 2024-07-05 LAB — CBC WITH DIFFERENTIAL (CANCER CENTER ONLY)
Abs Immature Granulocytes: 0.06 K/uL (ref 0.00–0.07)
Basophils Absolute: 0 K/uL (ref 0.0–0.1)
Basophils Relative: 0 %
Eosinophils Absolute: 0.1 K/uL (ref 0.0–0.5)
Eosinophils Relative: 1 %
HCT: 39.9 % (ref 39.0–52.0)
Hemoglobin: 14.2 g/dL (ref 13.0–17.0)
Immature Granulocytes: 1 %
Lymphocytes Relative: 9 %
Lymphs Abs: 0.8 K/uL (ref 0.7–4.0)
MCH: 31.9 pg (ref 26.0–34.0)
MCHC: 35.6 g/dL (ref 30.0–36.0)
MCV: 89.7 fL (ref 80.0–100.0)
Monocytes Absolute: 0.7 K/uL (ref 0.1–1.0)
Monocytes Relative: 8 %
Neutro Abs: 7.3 K/uL (ref 1.7–7.7)
Neutrophils Relative %: 81 %
Platelet Count: 204 K/uL (ref 150–400)
RBC: 4.45 MIL/uL (ref 4.22–5.81)
RDW: 12.3 % (ref 11.5–15.5)
WBC Count: 8.9 K/uL (ref 4.0–10.5)
nRBC: 0 % (ref 0.0–0.2)

## 2024-07-05 LAB — BASIC METABOLIC PANEL - CANCER CENTER ONLY
Anion gap: 6 (ref 5–15)
BUN: 18 mg/dL (ref 8–23)
CO2: 28 mmol/L (ref 22–32)
Calcium: 9.7 mg/dL (ref 8.9–10.3)
Chloride: 100 mmol/L (ref 98–111)
Creatinine: 1.16 mg/dL (ref 0.61–1.24)
GFR, Estimated: >60 mL/min (ref >60)
Glucose, Bld: 110 mg/dL — ABNORMAL HIGH (ref 70–99)
Potassium: 3.6 mmol/L (ref 3.5–5.1)
Sodium: 134 mmol/L — ABNORMAL LOW (ref 135–145)

## 2024-07-05 LAB — MAGNESIUM: Magnesium: 2 mg/dL (ref 1.7–2.4)

## 2024-07-05 MED ORDER — SODIUM CHLORIDE 0.9 % IV SOLN: INTRAVENOUS | Status: DC

## 2024-07-05 MED ORDER — PALONOSETRON HCL INJECTION 0.25 MG/5ML
0.2500 mg | Freq: Once | INTRAVENOUS | Status: AC
  Administered 2024-07-05: 0.25 mg via INTRAVENOUS
  Filled 2024-07-05: qty 5

## 2024-07-05 MED ORDER — POTASSIUM CHLORIDE IN NACL 20-0.9 MEQ/L-% IV SOLN
Freq: Once | INTRAVENOUS | Status: AC
  Filled 2024-07-05: qty 1000

## 2024-07-05 MED ORDER — DEXAMETHASONE SOD PHOSPHATE PF 10 MG/ML IJ SOLN
10.0000 mg | Freq: Once | INTRAMUSCULAR | Status: AC
  Administered 2024-07-05: 10 mg via INTRAVENOUS

## 2024-07-05 MED ORDER — MAGNESIUM SULFATE 2 GM/50ML IV SOLN
2.0000 g | Freq: Once | INTRAVENOUS | Status: AC
  Administered 2024-07-05: 2 g via INTRAVENOUS
  Filled 2024-07-05: qty 50

## 2024-07-05 MED ORDER — APREPITANT 130 MG/18ML IV EMUL
130.0000 mg | Freq: Once | INTRAVENOUS | Status: AC
  Administered 2024-07-05: 130 mg via INTRAVENOUS
  Filled 2024-07-05: qty 18

## 2024-07-05 MED ORDER — SODIUM CHLORIDE 0.9 % IV SOLN
40.0000 mg/m2 | Freq: Once | INTRAVENOUS | Status: AC
  Administered 2024-07-05: 81 mg via INTRAVENOUS
  Filled 2024-07-05: qty 81

## 2024-07-05 NOTE — Patient Instructions (Signed)
 [facility] - A DEPT OF [facility]  Discharge Instructions: Thank you for choosing [facility] to provide your oncology and hematology care.   If you have a lab appointment with the Cancer Center, please go directly to the Cancer Center and check in at the registration area.   Wear comfortable clothing and clothing appropriate for easy access to any Portacath or PICC line.   We strive to give you quality time with your provider. You may need to reschedule your appointment if you arrive late (15 or more minutes).  Arriving late affects you and other patients whose appointments are after yours.  Also, if you miss three or more appointments without notifying the office, you may be dismissed from the clinic at the provider's discretion.      For prescription refill requests, have your pharmacy contact our office and allow 72 hours for refills to be completed.    Today you received the following chemotherapy and/or immunotherapy agents: CISplatin (PLATINOL)     To help prevent nausea and vomiting after your treatment, we encourage you to take your nausea medication as directed.  BELOW ARE SYMPTOMS THAT SHOULD BE REPORTED IMMEDIATELY: *FEVER GREATER THAN 100.4 F (38 C) OR HIGHER *CHILLS OR SWEATING *NAUSEA AND VOMITING THAT IS NOT CONTROLLED WITH YOUR NAUSEA MEDICATION *UNUSUAL SHORTNESS OF BREATH *UNUSUAL BRUISING OR BLEEDING *URINARY PROBLEMS (pain or burning when urinating, or frequent urination) *BOWEL PROBLEMS (unusual diarrhea, constipation, pain near the anus) TENDERNESS IN MOUTH AND THROAT WITH OR WITHOUT PRESENCE OF ULCERS (sore throat, sores in mouth, or a toothache) UNUSUAL RASH, SWELLING OR PAIN  UNUSUAL VAGINAL DISCHARGE OR ITCHING   Items with * indicate a potential emergency and should be followed up as soon as possible or go to the Emergency Department if any problems should occur.  Please show the CHEMOTHERAPY ALERT CARD or  IMMUNOTHERAPY ALERT CARD at check-in to the Emergency Department and triage nurse.  Should you have questions after your visit or need to cancel or reschedule your appointment, please contact [facility] - A DEPT OF [facility]  Dept: [phone]  and follow the prompts.  Office hours are 8:00 a.m. to 4:30 p.m. Monday - Friday. Please note that voicemails left after 4:00 p.m. may not be returned until the following business day.  We are closed weekends and major holidays. You have access to a nurse at all times for urgent questions. Please call the main number to the clinic Dept: [phone] and follow the prompts.   For any non-urgent questions, you may also contact your provider using MyChart. We now offer e-Visits for anyone 41 and older to request care online for non-urgent symptoms. For details visit [email or web address].   Also download the MyChart app! Go to the app store, search "MyChart", open the app, select [place], and log in with your MyChart username and password.

## 2024-07-05 NOTE — Assessment & Plan Note (Signed)
 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   CT scan from July 2025 showed lymph nodes with extranodal extension, indicating a higher risk of recurrence without appropriate treatment. The cancer is aggressive due to lymph node involvement but has not spread beyond the head and neck area.   cT2,cN1,cM0,p16+, Stage 1 disease.  Concern for extranodal extension based on imaging.  Previously I discussed staging, prognosis, plan of care, treatment options.  Reviewed NCCN guidelines.  We have discussed about role of cisplatin being a radiosensitizer in the treatment of head and neck cancer.  We have discussed about the curative intent of chemoradiation for this patient.  Patient is willing to proceed with weekly cisplatin.  Emphasized the importance of hydration to protect kidney function. The treatment plan includes seven weeks of radiation and weekly chemotherapy, with the possibility of holding chemotherapy towards the end if side effects become problematic. The role of chemotherapy is supportive, making radiation more effective.   Labs today revealed creatinine of 1.16.  CBCD unremarkable.    Proceed with cycle 2 of cisplatin today at standard dosing of 40 mg/m.  He is to continue radiation treatments as scheduled.    We again emphasized the importance of hydration with him.   - Monitor kidney function and electrolytes weekly during treatment - Previously referred to physical therapy, speech language pathology, and nutritionist for supportive care  RTC in 1 week for labs, follow-up and continuation of chemotherapy.

## 2024-07-05 NOTE — Progress Notes (Signed)
 [facility]  ONCOLOGY CLINIC PROGRESS NOTE   Patient Care Team: Windy Coy, MD as PCP - General (Family Medicine) Pietro, Redell RAMAN, MD as PCP - Cardiology (Cardiology) Vertell Pont, RN as Oncology Nurse Navigator Renda Glance, MD as Consulting Physician (Urology) Patrcia Cough, MD as Consulting Physician (Radiation Oncology) Starla Wendelyn BIRCH, RN as Registered Nurse Malmfelt, Delon CROME, RN as Oncology Nurse Navigator Izell Domino, MD as Consulting Physician (Radiation Oncology) Autumn Millman, MD as Consulting Physician (Oncology) Lauralee Chew, MD as Referring Physician (Otolaryngology)  PATIENT NAME: Gilbert Thompson   MR#: [id number] DOB: 15-Apr-1951  Date of visit: 07/05/2024   ASSESSMENT & PLAN:   Gilbert Thompson is a 73 y.o. 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  Concern for extranodal extension based on imaging.  Started concurrent chemoradiation with weekly cisplatin from 06/27/2024.  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   CT scan from July 2025 showed lymph nodes with extranodal extension, indicating a higher risk of recurrence without appropriate treatment. The cancer is aggressive due to lymph node involvement but has not spread beyond the head and neck area.   cT2,cN1,cM0,p16+, Stage 1 disease.  Concern for extranodal extension based on imaging.  Previously I discussed staging, prognosis, plan of care, treatment options.  Reviewed NCCN guidelines.  We have discussed about role of cisplatin being a radiosensitizer in the treatment of head and neck cancer.  We have discussed  about the curative intent of chemoradiation for this patient.  Patient is willing to proceed with weekly cisplatin.  Emphasized the importance of hydration to protect kidney function. The treatment plan includes seven weeks of radiation and weekly chemotherapy, with the possibility of holding chemotherapy towards the end if side effects become problematic. The role of chemotherapy is supportive, making radiation more effective.   Labs today revealed creatinine of 1.16.  CBCD unremarkable.    Proceed with cycle 2 of cisplatin today at standard dosing of 40 mg/m.  He is to continue radiation treatments as scheduled.    We again emphasized the importance of hydration with him.   - Monitor kidney function and electrolytes weekly during treatment - Previously referred to physical therapy, speech language pathology, and nutritionist for supportive care  RTC in 1 week for labs, follow-up and continuation of chemotherapy.   Hyponatremia Sodium level is slightly low at 134 mmol/L, likely due to high water  intake. Kidney function is stable with creatinine at 1.16 mg/dL. Advised to replace some water  intake with electrolyte-based drinks to address hyponatremia.  I reviewed lab results and outside records for this visit and discussed relevant results with the patient. Diagnosis, plan of care and treatment options were also discussed in detail with the patient. Opportunity provided to ask questions and answers provided to his apparent satisfaction. Provided instructions to call our clinic with any problems, questions or concerns prior to return visit. I recommended to continue follow-up with PCP and sub-specialists. He verbalized understanding and agreed with the plan.   NCCN guidelines have been consulted in the planning of this patient's care.  I spent a total of 30 minutes during this encounter with the patient including review of chart and various tests results, discussions  about plan of care and  coordination of care plan.   [name], MD  07/05/2024 3:48 PM  [facility] - [facility] [address] [place] [address] Dept: [phone] Dept Fax: [phone]    CHIEF COMPLAINT/ REASON FOR VISIT:   Squamous cell carcinoma of the left tonsil/base of tongue. cT2,cN1,cM0,p16+, Stage 1 disease.  Concern for extranodal extension based on imaging.    Current Treatment:  Started concurrent chemoradiation with weekly cisplatin from 06/27/2024.  INTERVAL HISTORY:    Discussed the use of AI scribe software for clinical note transcription with the patient, who gave verbal consent to proceed.  History of Present Illness Gilbert Thompson is a 73 year old male undergoing chemotherapy who presents for follow-up.  He is currently in week two of chemotherapy treatment and reports no unexpected side effects. No sores in the mouth have developed. He has been prescribed lidocaine  for numbing but has not yet started using baking soda and salt mouthwashes to prevent sores.  His kidney function is being monitored closely, with creatinine levels at 1.16 and GFR above 60. He maintains good hydration, which is important for preserving kidney function.  No sores in the mouth.   I have reviewed the past medical history, past surgical history, social history and family history with the patient and they are unchanged from previous note.  HISTORY OF PRESENT ILLNESS:   ONCOLOGY HISTORY:   He presented to his PCP with complains of chronic pharyngitis and a left submandibular mass that has persisted over the past year.   To further investigate his symptoms, he underwent a CT neck and CT chest on 04/06/24 showing a 3.0 x 2.4 x 2.5 cm soft tissue mass centered in the left oropharynx at the glossotonsillar sulcus with possible involvement of the adjacent tongue base. 7 mm low-attenuation left level IB lymph node likely  reflecting a site of nodal metastatic disease. 1.7 x 1.7 cm irregular soft tissue focus at the left level II station, inseparable from the adjacent sternocleidomastoid muscle. This is suspicious for nodal metastatic disease (with findings concerning for extracapsular extension) or a tumor deposit. 9 mm short-axis low attenuation left level III lymph node likely reflecting a site of nodal metastatic disease. Scans also noted scattered tiny pulmonary nodules are unchanged and likely benign but with only 5 months of documented stability but with recommend attention on follow-up given his history of prostate cancer.    Subsequently, the patient was referred to Dr. Graig on 04/24/24 who performed a FNA biopsy of left neck. However, biopsy was inconclusive. As a result, he then underwent a laryngoscopy with biopsy on 05/15/24 under the care of Dr. Lauralee. 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 Tumor is moderately differentiated with foci of poor differentiation and invasion into the striated muscle. Immunohistochemical studies are p16, p40, and HRHPV are positive.    He had a cervical spine MRI performed on 05/09/24 revealing no evidence of metastatic disease to the cervical spine itself but did indicated degenerative spondylosis at C4-5 and C5-6. 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 PET scan performed on 04/26/24 showing FDG avid mass along the left tongue base/oropharynx with left cervical chain nodal metastatic disease with no suspicious pulmonary or mediastinal FDG uptake.   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    Prior cancers, if any: history of prostate cancer.   Given extracapsular extension with lymph node positive disease, plan made to proceed with concurrent chemoradiation.   Plan made for weekly cisplatin with  radiation, if renal function allows.  Otherwise we will use either docetaxel or carboplatin/paclitaxel regimen.  Started concurrent chemoradiation with weekly cisplatin from 06/27/2024.  Oncology History  Malignant neoplasm of prostate (HCC)  01/19/2023 Cancer Staging   Staging form: Prostate, AJCC 8th Edition - Clinical stage from 01/19/2023: Stage IIC (cT2a, cN0, cM0, PSA: 6, Grade Group: 4) - Signed by Sherwood Rise, PA-C on 03/30/2023 Histopathologic type: Adenocarcinoma, NOS Stage prefix: Initial diagnosis Prostate specific antigen (PSA) range: Less than 10 Gleason primary pattern: 4 Gleason secondary pattern: 4 Gleason score: 8 Histologic grading system: 5 grade system Number of biopsy cores examined: 14 Number of biopsy cores positive: 5 Location of positive needle core biopsies: One side   02/24/2023 Initial Diagnosis   Malignant neoplasm of prostate (HCC)   Primary squamous cell carcinoma of oropharynx (HCC)  06/07/2024 Initial Diagnosis   Malignant neoplasm of base of tongue (HCC)   06/07/2024 Cancer Staging   Staging form: Pharynx - HPV-Mediated Oropharynx, AJCC 8th Edition - Clinical stage from 06/07/2024: Stage I (cT2, cN1, cM0, p16+) - Signed by Izell Domino, MD on 06/07/2024 Stage prefix: Initial diagnosis   06/28/2024 -  Chemotherapy   Patient is on Treatment Plan : HEAD/NECK Cisplatin (40) q7d         REVIEW OF SYSTEMS:   Review of Systems - Oncology  All other pertinent systems were reviewed with the patient and are negative.  ALLERGIES: He has no known allergies.  MEDICATIONS:  Current Outpatient Medications  Medication Sig Dispense Refill   albuterol  (VENTOLIN  HFA) 108 (90 Base) MCG/ACT inhaler Inhale 1-2 puffs into the lungs every 6 (six) hours as needed for wheezing or shortness of breath.     allopurinol  (ZYLOPRIM ) 300 MG tablet Take 300 mg by mouth daily.     ALPRAZolam  (XANAX ) 1 MG tablet Take 1 mg by mouth at bedtime as needed for anxiety or sleep.      aspirin  EC 81 MG tablet Take 81 mg by mouth daily. Swallow whole.     celecoxib (CELEBREX) 200 MG capsule  (Patient taking differently: Not taking at this time)     cholecalciferol (VITAMIN D3) 25 MCG (1000 UNIT) tablet Take 1,000 Units by mouth daily.     dexamethasone  (DECADRON ) 4 MG tablet Take 2 tablets (8 mg) by mouth daily x 3 days starting the day after cisplatin chemotherapy. Take with food. 30 tablet 1   ezetimibe  (ZETIA ) 10 MG tablet TAKE 1 TABLET BY MOUTH DAILY 90 tablet 3   finasteride  (PROSCAR ) 5 MG tablet Take 1.25 mg by mouth daily.     HYDROcodone -acetaminophen  (NORCO/VICODIN) 5-325 MG tablet Take 1 tablet by mouth every 6 (six) hours as needed for severe pain.     ibuprofen (ADVIL) 200 MG tablet Take 600 mg by mouth daily as needed for moderate pain (pain score 4-6).     lidocaine  (XYLOCAINE ) 2 % solution Patient: Mix 1part 2% viscous lidocaine , 1part H20. Swish & swallow 10mL of diluted mixture, 30min before meals and at bedtime, up to QID 200 mL 3   lidocaine -prilocaine  (EMLA ) cream Apply to affected area once 30 g 3   losartan  (COZAAR ) 50 MG tablet Take 1 tablet (50 mg total) by mouth daily. 90 tablet 3  metaxalone (SKELAXIN) 800 MG tablet Take 400 mg by mouth 3 (three) times daily as needed for muscle spasms.     nitroGLYCERIN  (NITROSTAT ) 0.4 MG SL tablet Place 1 tablet (0.4 mg total) under the tongue every 5 (five) minutes as needed for chest pain. 90 tablet 3   ondansetron  (ZOFRAN ) 8 MG tablet Take 1 tablet (8 mg total) by mouth every 8 (eight) hours as needed for nausea or vomiting. Start on the third day after cisplatin. 30 tablet 1   oxyCODONE  (OXY IR/ROXICODONE ) 5 MG immediate release tablet Take 1 tablet (5 mg total) by mouth every 6 (six) hours as needed. 90 tablet 0   pantoprazole  (PROTONIX ) 40 MG tablet Take 1 tablet (40 mg total) by mouth 2 (two) times daily. 180 tablet 3   polyethylene glycol (MIRALAX / GLYCOLAX) 17 g packet Take 17 g by mouth daily as needed for  moderate constipation.     prochlorperazine  (COMPAZINE ) 10 MG tablet Take 1 tablet (10 mg total) by mouth every 6 (six) hours as needed (Nausea or vomiting). 30 tablet 1   rosuvastatin  (CRESTOR ) 40 MG tablet Take 1 tablet (40 mg total) by mouth daily. 90 tablet 3   Tetrahydrozoline HCl (REDNESS RELIEVER EYE DROPS OP) Place 1 drop into both eyes daily as needed (redness).     TRELEGY ELLIPTA 100-62.5-25 MCG/ACT AEPB Inhale 1 puff into the lungs daily.     No current facility-administered medications for this visit.   Facility-Administered Medications Ordered in Other Visits  Medication Dose Route Frequency Provider Last Rate Last Admin   0.9 %  sodium chloride  infusion   Intravenous Continuous Fillmore Bynum, MD 10 mL/hr at 07/05/24 0939 New Bag at 07/05/24 0939     VITALS:   Blood pressure (!) 113/59, pulse 73, temperature (!) 97.4 F (36.3 C), temperature source Tympanic, resp. rate 18, height 5' 9 (1.753 m), weight 179 lb 8 oz (81.4 kg), SpO2 99%.  Wt Readings from Last 3 Encounters:  07/05/24 179 lb 8 oz (81.4 kg)  06/28/24 183 lb (83 kg)  06/19/24 185 lb 6.4 oz (84.1 kg)    Body mass index is 26.51 kg/m.   Onc Performance Status - 07/05/24 0844       ECOG Perf Status   ECOG Perf Status Restricted in physically strenuous activity but ambulatory and able to carry out work of a light or sedentary nature, e.g., light house work, office work      KPS SCALE   KPS % SCORE Able to carry on normal activity, minor s/s of disease          PHYSICAL EXAM:   Physical Exam Constitutional:      General: He is not in acute distress.    Appearance: Normal appearance.  HENT:     Head: Normocephalic and atraumatic.     Mouth/Throat:     Comments:  Abnormal tissue noted in the left glossotonsillar sulcus Eyes:     Conjunctiva/sclera: Conjunctivae normal.  Neck:     Comments:  2 cm, relatively firm mass at the angle of left jaw Cardiovascular:     Rate and Rhythm: Normal rate and  regular rhythm.  Pulmonary:     Effort: Pulmonary effort is normal. No respiratory distress.  Chest:     Comments: Right-sided Port-A-Cath in place without any signs of infection Abdominal:     General: There is no distension.     Comments: Feeding tube in place  Neurological:     General: No  focal deficit present.     Mental Status: He is alert and oriented to person, place, and time.  Psychiatric:        Mood and Affect: Mood normal.        Behavior: Behavior normal.       LABORATORY DATA:   I have reviewed the data as listed.  Results for orders placed or performed in visit on 07/05/24  CBC with Differential (Cancer Center Only)  Result Value Ref Range   WBC Count 8.9 4.0 - 10.5 K/uL   RBC 4.45 4.22 - 5.81 MIL/uL   Hemoglobin 14.2 13.0 - 17.0 g/dL   HCT 60.0 60.9 - 47.9 %   MCV 89.7 80.0 - 100.0 fL   MCH 31.9 26.0 - 34.0 pg   MCHC 35.6 30.0 - 36.0 g/dL   RDW 87.6 88.4 - 84.4 %   Platelet Count 204 150 - 400 K/uL   nRBC 0.0 0.0 - 0.2 %   Neutrophils Relative % 81 %   Neutro Abs 7.3 1.7 - 7.7 K/uL   Lymphocytes Relative 9 %   Lymphs Abs 0.8 0.7 - 4.0 K/uL   Monocytes Relative 8 %   Monocytes Absolute 0.7 0.1 - 1.0 K/uL   Eosinophils Relative 1 %   Eosinophils Absolute 0.1 0.0 - 0.5 K/uL   Basophils Relative 0 %   Basophils Absolute 0.0 0.0 - 0.1 K/uL   Immature Granulocytes 1 %   Abs Immature Granulocytes 0.06 0.00 - 0.07 K/uL  Basic Metabolic Panel - Cancer Center Only  Result Value Ref Range   Sodium 134 (L) 135 - 145 mmol/L   Potassium 3.6 3.5 - 5.1 mmol/L   Chloride 100 98 - 111 mmol/L   CO2 28 22 - 32 mmol/L   Glucose, Bld 110 (H) 70 - 99 mg/dL   BUN 18 8 - 23 mg/dL   Creatinine 8.83 9.38 - 1.24 mg/dL   Calcium  9.7 8.9 - 10.3 mg/dL   GFR, Estimated >39 >39 mL/min   Anion gap 6 5 - 15  Magnesium   Result Value Ref Range   Magnesium  2.0 1.7 - 2.4 mg/dL  Results for orders placed or performed in visit on 07/05/24  Rad Onc Aria Session Summary  Result  Value Ref Range   Course ID [id number]    Course Start Date 06/19/2024    Session Number 7    Course First Treatment Date 06/27/2024  2:31 PM    Course Last Treatment Date 07/05/2024  7:46 AM    Course Elapsed Days 8    Reference Point ID HN_Oroph dp    Reference Point Dosage Given to Date 14 Gy   Reference Point Session Dosage Given 2 Gy   Plan ID HN_Oroph    Plan Name HN_Oroph    Plan Fractions Treated to Date 7    Plan Total Fractions Prescribed 35    Plan Prescribed Dose Per Fraction 2 Gy   Plan Total Prescribed Dose 70.000000 Gy   Plan Primary Reference Point HN_Oroph dp       RADIOGRAPHIC STUDIES:  I have personally reviewed the radiological images as listed and agree with the findings in the report.  IR GASTROSTOMY TUBE MOD SED Result Date: 06/23/2024 INDICATION: he will receive chemo/radiation for head and neck cancer EXAM: Procedures: 1. FLUOROSCOPIC NASOGASTRIC TUBE PLACEMENT 2. PERCUTANEOUS GASTROSTOMY FEEDING TUBE PLACEMENT COMPARISON:  PET-CT, 04/26/2024 and 02/22/2023. CT chest, 04/06/2024 MEDICATIONS: Ancef  2 gm IV; Antibiotics were administered within 1 hour of the procedure.  Glucagon 1 mg IV CONTRAST:  15 mL of Isovue  300 administered into the gastric lumen. ANESTHESIA/SEDATION: Moderate (conscious) sedation was employed during this procedure. A total of Versed  1 mg and Fentanyl  50 mcg was administered intravenously. Moderate Sedation Time: 15 minutes. The patient's level of consciousness and vital signs were monitored continuously by radiology nursing throughout the procedure under my direct supervision. FLUOROSCOPY: Radiation Exposure Index and estimated peak skin dose (PSD); Reference air kerma (RAK), 7 mGy. COMPLICATIONS: None immediate. PROCEDURE: Informed written consent was obtained from the patient and/or patient's representative following explanation of the procedure, risks, benefits and alternatives. A time out was performed prior to the initiation of the procedure.  Maximal barrier sterile technique utilized including caps, mask, sterile gowns, sterile gloves, large sterile drape, hand hygiene and sterile prep. The LEFT upper quadrant was sterilely prepped and draped. A oral gastric catheter was inserted into the stomach under fluoroscopy. The LEFT costal margin and barium opacified transverse colon were identified and avoided. Air was injected into the stomach for insufflation and visualization under fluoroscopy. Under sterile conditions and local anesthesia, 2 T tacks were utilized to pexy the anterior aspect of the stomach against the ventral abdominal wall. Contrast injection confirmed appropriate positioning of each of the T tacks. An incision was made between the T tacks and a 17 gauge trocar needle was utilized to access the stomach. Needle position was confirmed within the stomach with aspiration of air and injection of a small amount of contrast. A stiff guidewire was advanced into the gastric lumen and under intermittent fluoroscopic guidance, the access needle was exchanged for a telescoping peel-away sheath, ultimately allowing placement of a 14 Fr balloon retention gastrostomy tube. The retention balloon was insufflated with a mixture of dilute saline and contrast and pulled taut against the anterior wall of the stomach. The external disc was cinched. Contrast injection confirms positioning within the stomach. Several spot radiographic images were obtained in various obliquities for documentation. The patient tolerated procedure well without immediate post procedural complication. FINDINGS: After successful fluoroscopic guided placement, the gastrostomy tube is appropriately positioned with internal retention balloon against the ventral aspect of the gastric lumen. IMPRESSION: Successful fluoroscopic insertion of a 14 Fr balloon retention gastrostomy tube. The gastrostomy may be used immediately for medication administration and in 4 hrs for the initiation of  feeds. RECOMMENDATIONS: The patient will return to V[facility]iology (VIR) for routine feeding tube evaluation and exchange in 6 months. Thom Hall, MD [facility] Radiology Electronically Signed   By: Thom Hall M.D.   On: 06/23/2024 14:41   IR IMAGING GUIDED PORT INSERTION Result Date: 06/23/2024 INDICATION: he will receive chemo/radiation for head and neck cancer EXAM: IMPLANTED PORT A CATH PLACEMENT WITH ULTRASOUND AND FLUOROSCOPIC GUIDANCE MEDICATIONS: None ANESTHESIA/SEDATION: Moderate (conscious) sedation was employed during this procedure. A total of Versed  3 mg and Fentanyl  150 mcg was administered intravenously. Moderate Sedation Time: 20 minutes. The patient's level of consciousness and vital signs were monitored continuously by radiology nursing throughout the procedure under my direct supervision. FLUOROSCOPY: Radiation Exposure Index and estimated peak skin dose (PSD); Reference air kerma (RAK), 0 mGy. COMPLICATIONS: None immediate. PROCEDURE: The procedure, risks, benefits, and alternatives were explained to the patient. Questions regarding the procedure were encouraged and answered. The patient understands and consents to the procedure. The RIGHT neck and chest were prepped with chlorhexidine  in a sterile fashion, and a sterile drape was applied covering the operative field. Maximum barrier sterile  technique with sterile gowns and gloves were used for the procedure. A timeout was performed prior to the initiation of the procedure. Local anesthesia was provided with 1% lidocaine  with epinephrine . After creating a small venotomy incision, a micropuncture kit was utilized to access the internal jugular vein under direct, real-time ultrasound guidance. Ultrasound image documentation was performed. The microwire was kinked to measure appropriate catheter length. A subcutaneous port pocket was then created along the upper chest wall  utilizing a combination of sharp and blunt dissection. The pocket was irrigated with sterile saline. A single lumen power injectable port was chosen for placement. The 8 Fr catheter was tunneled from the port pocket site to the venotomy incision. The port was placed in the pocket. The external catheter was trimmed to appropriate length. At the venotomy, an 8 Fr peel-away sheath was placed over a guidewire under fluoroscopic guidance. The catheter was then placed through the sheath and the sheath was removed. Final catheter positioning was confirmed and documented with a fluoroscopic spot radiograph. The port was accessed with a Huber needle, aspirated and flushed with heparinized saline. The port pocket incision was closed with interrupted 3-0 Vicryl suture then Dermabond was applied, including at the venotomy incision. Dressings were placed. The patient tolerated the procedure well without immediate post procedural complication. IMPRESSION: Successful placement of a RIGHT internal jugular approach power injectable Port-A-Cath. The tip of the catheter is positioned within the proximal RIGHT atrium. The catheter is ready for immediate use. Thom Hall, MD [facility] Radiology Electronically Signed   By: Thom Hall M.D.   On: 06/23/2024 13:41     CODE STATUS:  Code Status History     Date Active Date Inactive Code Status Order ID Comments User Context   06/23/2024 1257 06/24/2024 0510 Full Code [id number]  Hall Thom, MD HOV   06/23/2024 1257 06/23/2024 1257 Full Code [id number]  Hall Thom, MD HOV   04/29/2023 1830 04/30/2023 1811 Full Code [id number]  Renda Glance, MD Inpatient   06/05/2022 0846 06/05/2022 1634 Full Code [id number]  Claudene Victory ORN, MD Inpatient    Questions for Most Recent Historical Code Status (Order [id number])     Question Answer   By: Consent: discussion documented in EHR            No orders of the defined types were placed in  this encounter.    Future Appointments  Date Time Provider Department Center  07/06/2024  9:45 AM [facility] [id number] [facility] None  07/07/2024 11:00 AM [facility] LINAC 3 [facility] None  07/10/2024 11:45 AM [facility] [id number] [facility] None  07/10/2024 12:00 PM LINAC-SQUIRE [facility] None  07/11/2024  9:45 AM [facility] [id number] [facility] None  07/12/2024  7:30 AM [facility] LINAC 3 [facility] None  07/12/2024  8:30 AM [facility] FLUSH [facility] None  07/12/2024  9:00 AM Heru Montz, MD [facility] None  07/12/2024  9:30 AM [facility] INFUSION [facility] None  07/12/2024 12:00 PM Ivonne Harlene RAMAN, RD [facility] None  07/13/2024  9:45 AM [facility] [id number] [facility] None  07/14/2024  9:45 AM [facility] [id number] [facility] None  07/17/2024  9:45 AM [facility] [id number] [facility] None  07/18/2024  9:45 AM [facility] [id number] [facility] None  07/19/2024  8:45 AM [facility] FLUSH [facility] None  07/19/2024  9:15 AM Sheena Donegan, MD [facility] None  07/19/2024  9:45 AM [facility] LINAC 3 [facility] None  07/20/2024  7:30 AM [facility] INFUSION [facility] None  07/20/2024  2:30 PM Daryle Railing L, RD [facility] None  07/20/2024  3:15 PM [facility] [id number] [facility] None  07/21/2024  9:45 AM [facility] LINAC 3 [facility] None  07/24/2024 10:00 AM [facility] LINAC 3 [facility] None  07/25/2024 10:00 AM [facility] LINAC 3 [facility] None  07/26/2024  7:30 AM [facility] LINAC 3 [facility] None  07/26/2024  8:00 AM [facility] FLUSH [facility] None  07/26/2024  9:00 AM Demetric Parslow, MD [facility] None  07/26/2024  9:30 AM [facility] INFUSION [facility] None  07/26/2024  9:30 AM Ivonne Harlene RAMAN, RD [facility] None  07/27/2024 10:00 AM [facility] LINAC 3 [facility] None  07/28/2024 10:00 AM [facility] LINAC 3 [facility] None  07/31/2024 10:00 AM [facility] LINAC 3 [facility] None  08/01/2024 10:00 AM [facility] LINAC  3 [facility] None  08/02/2024  8:15 AM [facility] [id number] [facility] None  08/02/2024  8:45 AM [facility] FLUSH [facility] None  08/02/2024  9:15 AM Natiya Seelinger, MD [facility] None  08/02/2024 10:00 AM [facility] INFUSION [facility] None  08/02/2024 11:15 AM Ivonne Harlene RAMAN, RD [facility] None  08/03/2024 10:00 AM [facility] LINAC 3 [facility] None  08/04/2024 10:00 AM [facility] LINAC 3 [facility] None  08/07/2024 10:00 AM [facility] LINAC 3 [facility] None  08/08/2024 10:00 AM [facility] LINAC 3 [facility] None  08/09/2024 10:00 AM [facility] LINAC 3 [facility] None  08/10/2024 10:00 AM [facility] LINAC 3 [facility] None  08/11/2024 10:00 AM [facility] LINAC 3 [facility] None  08/14/2024 10:00 AM [facility] LINAC 3 [facility] None  09/06/2024 10:00 AM Breedlove Blue, Blaire L, PT [id number] None  12/22/2024  3:30 PM [facility] 1 [facility] [place]      This document was completed utilizing speech recognition software. Grammatical errors, random word insertions, pronoun errors, and incomplete sentences are an occasional consequence of this system due to software limitations, ambient noise, and hardware issues. Any formal questions or concerns about the content, text or information contained within the body of this dictation should be directly addressed to the provider for clarification.

## 2024-07-05 NOTE — Progress Notes (Signed)
 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  Patient receives concurrent chemoradiation therapy with weekly cisplatin.  He is followed by Dr. Izell and Dr. Autumn.  Status post PEG -October 3  Weight: 179 pounds 8 oz Oct 15 183 pounds Oct 8  Labs: Magnesium  2.0, sodium 134, glucose 110  Estimated nutrition needs:  2160-2330 cal, 99-116 g protein, greater than 2.1 L fluid.  Patient reports he is doing well.  He has not tried oral nutrition supplements yet.  He had a smoothie he the other day which he enjoyed.  States he is eating many of the same foods but needs to eat a bit slower.  He has been drinking increased water  and estimates approximately 100 ounces daily.  MD encouraged patient to add Gatorade or electrolyte drinks to some of his water .  He has constipation and has tried MiraLAX.  So far it has not been successful.  He is considering taking Dulcolax or Senokot.  He denies nausea, vomiting, and diarrhea.  He is cleaning around his feeding tube daily and flushes it with water  every day.  He reports there is some slight drainage.  States skin around his feeding tube is not red.  Reports Navigator is going to remove T tack.  Nutrition diagnosis: Predicted sub-optimal energy intake, ongoing  Intervention: 1-2 bottles of Ensure Plus or equivalent daily. Continue adding high-calorie, high-protein foods throughout the day. Adjust textures and temperatures as needed. Encouraged bowel regimen. Agree with Gatorade or equivalent added to water  to improve electrolytes.  Monitoring, evaluation, goals: Tolerate adequate calories and protein to minimize weight loss.  Next visit: Wednesday, October 22, with Elvie.  **Disclaimer: This note was dictated with voice recognition software. Similar sounding words can inadvertently be transcribed and this note may contain transcription errors which may not have been corrected upon publication of note.**

## 2024-07-06 ENCOUNTER — Ambulatory Visit
Admission: RE | Admit: 2024-07-06 | Discharge: 2024-07-06 | Disposition: A | Discharge status: Home / Self Care | Source: Ambulatory Visit | Attending: Radiation Oncology | Admitting: Radiation Oncology

## 2024-07-06 ENCOUNTER — Other Ambulatory Visit: Payer: Self-pay

## 2024-07-06 DIAGNOSIS — Z510 Encounter for antineoplastic radiation therapy: Secondary | ICD-10-CM | POA: Diagnosis not present

## 2024-07-06 LAB — RAD ONC ARIA SESSION SUMMARY
Course Elapsed Days: 9
Plan Fractions Treated to Date: 8
Plan Prescribed Dose Per Fraction: 2 Gy
Plan Total Fractions Prescribed: 35
Plan Total Prescribed Dose: 70 Gy
Reference Point Dosage Given to Date: 16 Gy
Reference Point Session Dosage Given: 2 Gy
Session Number: 8

## 2024-07-07 ENCOUNTER — Ambulatory Visit
Admission: RE | Admit: 2024-07-07 | Discharge: 2024-07-07 | Disposition: A | Discharge status: Home / Self Care | Source: Ambulatory Visit | Attending: Radiation Oncology | Admitting: Radiation Oncology

## 2024-07-07 ENCOUNTER — Other Ambulatory Visit: Payer: Self-pay

## 2024-07-07 ENCOUNTER — Ambulatory Visit: Admitting: Gastroenterology

## 2024-07-07 DIAGNOSIS — Z510 Encounter for antineoplastic radiation therapy: Secondary | ICD-10-CM | POA: Diagnosis not present

## 2024-07-07 LAB — RAD ONC ARIA SESSION SUMMARY
Course Elapsed Days: 10
Plan Fractions Treated to Date: 9
Plan Prescribed Dose Per Fraction: 2 Gy
Plan Total Fractions Prescribed: 35
Plan Total Prescribed Dose: 70 Gy
Reference Point Dosage Given to Date: 18 Gy
Reference Point Session Dosage Given: 2 Gy
Session Number: 9

## 2024-07-10 ENCOUNTER — Ambulatory Visit
Admission: RE | Admit: 2024-07-10 | Discharge: 2024-07-10 | Disposition: A | Discharge status: Home / Self Care | Source: Ambulatory Visit | Attending: Radiation Oncology | Admitting: Radiation Oncology

## 2024-07-10 ENCOUNTER — Other Ambulatory Visit: Payer: Self-pay

## 2024-07-10 DIAGNOSIS — Z510 Encounter for antineoplastic radiation therapy: Secondary | ICD-10-CM | POA: Diagnosis not present

## 2024-07-10 DIAGNOSIS — C090 Malignant neoplasm of tonsillar fossa: Secondary | ICD-10-CM | POA: Diagnosis not present

## 2024-07-10 LAB — RAD ONC ARIA SESSION SUMMARY
Course Elapsed Days: 13
Plan Fractions Treated to Date: 10
Plan Prescribed Dose Per Fraction: 2 Gy
Plan Total Fractions Prescribed: 35
Plan Total Prescribed Dose: 70 Gy
Reference Point Dosage Given to Date: 20 Gy
Reference Point Session Dosage Given: 2 Gy
Session Number: 10

## 2024-07-11 ENCOUNTER — Ambulatory Visit
Admission: RE | Admit: 2024-07-11 | Discharge: 2024-07-11 | Disposition: A | Source: Ambulatory Visit | Attending: Radiation Oncology

## 2024-07-11 ENCOUNTER — Other Ambulatory Visit: Payer: Self-pay

## 2024-07-11 DIAGNOSIS — Z510 Encounter for antineoplastic radiation therapy: Secondary | ICD-10-CM | POA: Diagnosis not present

## 2024-07-11 LAB — RAD ONC ARIA SESSION SUMMARY
Course Elapsed Days: 14
Plan Fractions Treated to Date: 11
Plan Prescribed Dose Per Fraction: 2 Gy
Plan Total Fractions Prescribed: 35
Plan Total Prescribed Dose: 70 Gy
Reference Point Dosage Given to Date: 22 Gy
Reference Point Session Dosage Given: 2 Gy
Session Number: 11

## 2024-07-12 ENCOUNTER — Ambulatory Visit
Admission: RE | Admit: 2024-07-12 | Discharge: 2024-07-12 | Disposition: A | Discharge status: Home / Self Care | Source: Ambulatory Visit | Attending: Radiation Oncology | Admitting: Radiation Oncology

## 2024-07-12 ENCOUNTER — Other Ambulatory Visit: Payer: Self-pay

## 2024-07-12 ENCOUNTER — Inpatient Hospital Stay

## 2024-07-12 ENCOUNTER — Inpatient Hospital Stay: Admitting: Dietician

## 2024-07-12 ENCOUNTER — Encounter: Payer: Self-pay | Admitting: Oncology

## 2024-07-12 ENCOUNTER — Inpatient Hospital Stay: Admitting: Oncology

## 2024-07-12 VITALS — BP 125/64 | HR 71 | Resp 18

## 2024-07-12 VITALS — BP 115/52 | HR 78 | Temp 97.2°F | Resp 16 | Ht 69.0 in | Wt 176.4 lb

## 2024-07-12 DIAGNOSIS — C109 Malignant neoplasm of oropharynx, unspecified: Secondary | ICD-10-CM

## 2024-07-12 DIAGNOSIS — Z510 Encounter for antineoplastic radiation therapy: Secondary | ICD-10-CM | POA: Diagnosis not present

## 2024-07-12 DIAGNOSIS — Z5111 Encounter for antineoplastic chemotherapy: Secondary | ICD-10-CM | POA: Diagnosis not present

## 2024-07-12 LAB — RAD ONC ARIA SESSION SUMMARY
Course Elapsed Days: 15
Plan Fractions Treated to Date: 12
Plan Prescribed Dose Per Fraction: 2 Gy
Plan Total Fractions Prescribed: 35
Plan Total Prescribed Dose: 70 Gy
Reference Point Dosage Given to Date: 24 Gy
Reference Point Session Dosage Given: 2 Gy
Session Number: 12

## 2024-07-12 LAB — BASIC METABOLIC PANEL - CANCER CENTER ONLY
Anion gap: 6 (ref 5–15)
BUN: 27 mg/dL — ABNORMAL HIGH (ref 8–23)
CO2: 28 mmol/L (ref 22–32)
Calcium: 9.9 mg/dL (ref 8.9–10.3)
Chloride: 101 mmol/L (ref 98–111)
Creatinine: 1.44 mg/dL — ABNORMAL HIGH (ref 0.61–1.24)
GFR, Estimated: 51 mL/min — ABNORMAL LOW (ref >60)
Glucose, Bld: 139 mg/dL — ABNORMAL HIGH (ref 70–99)
Potassium: 3.4 mmol/L — ABNORMAL LOW (ref 3.5–5.1)
Sodium: 135 mmol/L (ref 135–145)

## 2024-07-12 LAB — CBC WITH DIFFERENTIAL (CANCER CENTER ONLY)
Abs Immature Granulocytes: 0.03 K/uL (ref 0.00–0.07)
Basophils Absolute: 0 K/uL (ref 0.0–0.1)
Basophils Relative: 0 %
Eosinophils Absolute: 0 K/uL (ref 0.0–0.5)
Eosinophils Relative: 0 %
HCT: 38 % — ABNORMAL LOW (ref 39.0–52.0)
Hemoglobin: 13.7 g/dL (ref 13.0–17.0)
Immature Granulocytes: 0 %
Lymphocytes Relative: 3 %
Lymphs Abs: 0.3 K/uL — ABNORMAL LOW (ref 0.7–4.0)
MCH: 31.8 pg (ref 26.0–34.0)
MCHC: 36.1 g/dL — ABNORMAL HIGH (ref 30.0–36.0)
MCV: 88.2 fL (ref 80.0–100.0)
Monocytes Absolute: 0.3 K/uL (ref 0.1–1.0)
Monocytes Relative: 4 %
Neutro Abs: 7.7 K/uL (ref 1.7–7.7)
Neutrophils Relative %: 93 %
Platelet Count: 140 K/uL — ABNORMAL LOW (ref 150–400)
RBC: 4.31 MIL/uL (ref 4.22–5.81)
RDW: 12.3 % (ref 11.5–15.5)
WBC Count: 8.4 K/uL (ref 4.0–10.5)
nRBC: 0 % (ref 0.0–0.2)

## 2024-07-12 LAB — MAGNESIUM: Magnesium: 2 mg/dL (ref 1.7–2.4)

## 2024-07-12 MED ORDER — APREPITANT 130 MG/18ML IV EMUL
130.0000 mg | Freq: Once | INTRAVENOUS | Status: AC
  Administered 2024-07-12: 130 mg via INTRAVENOUS
  Filled 2024-07-12: qty 18

## 2024-07-12 MED ORDER — DEXAMETHASONE SOD PHOSPHATE PF 10 MG/ML IJ SOLN
10.0000 mg | Freq: Once | INTRAMUSCULAR | Status: AC
  Administered 2024-07-12: 10 mg via INTRAVENOUS

## 2024-07-12 MED ORDER — MAGNESIUM SULFATE 2 GM/50ML IV SOLN
2.0000 g | Freq: Once | INTRAVENOUS | Status: AC
  Administered 2024-07-12: 2 g via INTRAVENOUS
  Filled 2024-07-12: qty 50

## 2024-07-12 MED ORDER — SODIUM CHLORIDE 0.9 % IV SOLN: INTRAVENOUS | Status: DC

## 2024-07-12 MED ORDER — SODIUM CHLORIDE 0.9 % IV SOLN
20.0000 mg/m2 | Freq: Once | INTRAVENOUS | Status: AC
  Administered 2024-07-12: 40 mg via INTRAVENOUS
  Filled 2024-07-12: qty 40

## 2024-07-12 MED ORDER — POTASSIUM CHLORIDE IN NACL 20-0.9 MEQ/L-% IV SOLN
Freq: Once | INTRAVENOUS | Status: AC
  Filled 2024-07-12: qty 1000

## 2024-07-12 MED ORDER — PALONOSETRON HCL INJECTION 0.25 MG/5ML
0.2500 mg | Freq: Once | INTRAVENOUS | Status: AC
  Administered 2024-07-12: 0.25 mg via INTRAVENOUS
  Filled 2024-07-12: qty 5

## 2024-07-12 NOTE — Assessment & Plan Note (Addendum)
 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   CT scan from July 2025 showed lymph nodes with extranodal extension, indicating a higher risk of recurrence without appropriate treatment. The cancer is aggressive due to lymph node involvement but has not spread beyond the head and neck area.   cT2,cN1,cM0,p16+, Stage 1 disease.  Concern for extranodal extension based on imaging.  Previously I discussed staging, prognosis, plan of care, treatment options.  Reviewed NCCN guidelines.  We have discussed about role of cisplatin being a radiosensitizer in the treatment of head and neck cancer.  We have discussed about the curative intent of chemoradiation for this patient.  Patient is willing to proceed with weekly cisplatin.  Emphasized the importance of hydration to protect kidney function. The treatment plan includes seven weeks of radiation and weekly chemotherapy, with the possibility of holding chemotherapy towards the end if side effects become problematic. The role of chemotherapy is supportive, making radiation more effective.   Labs today revealed creatinine of 1.44.  CBCD showed platelet count of 140,000, otherwise unremarkable.    Because of elevated creatinine, we will dose reduce cisplatin by 50% and proceed with 20 mg/m dose with cycle 3 today.  We will arrange for IV fluids on Monday, Wednesday, Friday each week, going forward to maintain his creatinine at a safe level.   He is to continue radiation treatments as scheduled.    We again emphasized the importance of hydration with him.  He has been making a good effort and maintaining hydration.  - Monitor kidney function and electrolytes weekly during treatment - Previously referred to physical therapy, speech language pathology, and nutritionist for supportive care  RTC in 1 week for labs, follow-up and continuation of chemotherapy.

## 2024-07-12 NOTE — Progress Notes (Signed)
 [facility]  ONCOLOGY CLINIC PROGRESS NOTE   Patient Care Team: Windy Coy, MD as PCP - General (Family Medicine) Pietro, Redell RAMAN, MD as PCP - Cardiology (Cardiology) Vertell Pont, RN as Oncology Nurse Navigator Renda Glance, MD as Consulting Physician (Urology) Patrcia Cough, MD as Consulting Physician (Radiation Oncology) Starla Wendelyn BIRCH, RN as Registered Nurse Malmfelt, Delon CROME, RN as Oncology Nurse Navigator Izell Domino, MD as Consulting Physician (Radiation Oncology) Autumn Millman, MD as Consulting Physician (Oncology) Lauralee Chew, MD as Referring Physician (Otolaryngology)  PATIENT NAME: Gilbert Thompson   MR#: [id number] DOB: August 28, 1951  Date of visit: 07/12/2024   ASSESSMENT & PLAN:   Gilbert Thompson is a 73 y.o. 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  Concern for extranodal extension based on imaging.  Started concurrent chemoradiation with weekly cisplatin from 06/27/2024.  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   CT scan from July 2025 showed lymph nodes with extranodal extension, indicating a higher risk of recurrence without appropriate treatment. The cancer is aggressive due to lymph node involvement but has not spread beyond the head and neck area.   cT2,cN1,cM0,p16+, Stage 1 disease.  Concern for extranodal extension based on imaging.  Previously I discussed staging, prognosis, plan of care, treatment options.  Reviewed NCCN guidelines.  We have discussed about role of cisplatin being a radiosensitizer in the treatment of head and neck cancer.  We have discussed  about the curative intent of chemoradiation for this patient.  Patient is willing to proceed with weekly cisplatin.  Emphasized the importance of hydration to protect kidney function. The treatment plan includes seven weeks of radiation and weekly chemotherapy, with the possibility of holding chemotherapy towards the end if side effects become problematic. The role of chemotherapy is supportive, making radiation more effective.   Labs today revealed creatinine of 1.44.  CBCD showed platelet count of 140,000, otherwise unremarkable.    Because of elevated creatinine, we will dose reduce cisplatin by 50% and proceed with 20 mg/m dose with cycle 3 today.  We will arrange for IV fluids on Monday, Wednesday, Friday each week, going forward to maintain his creatinine at a safe level.   He is to continue radiation treatments as scheduled.    We again emphasized the importance of hydration with him.  He has been making a good effort and maintaining hydration.  - Monitor kidney function and electrolytes weekly during treatment - Previously referred to physical therapy, speech language pathology, and nutritionist for supportive care  RTC in 1 week for labs, follow-up and continuation of chemotherapy.  Chemotherapy-induced acute kidney injury Acute kidney injury likely secondary to chemotherapy, evidenced by increased creatinine from 1.16 to 1.44. - Administer IV fluids on Friday, Monday, and Wednesday to improve renal function. - Re-evaluate renal function weekly.  Hypokalemia Mild hypokalemia with potassium level at 3.4, slightly below normal. - Administer potassium supplementation through IV fluids.  Nocturia Nocturia with frequent urination at night, possibly exacerbated by fluid intake before bedtime. Related to hydration efforts and recent prostatectomy. - Advise limiting fluid intake 1-2 hours before bedtime to reduce nocturia. - Encourage continued hydration during the day.  I reviewed  lab results and  outside records for this visit and discussed relevant results with the patient. Diagnosis, plan of care and treatment options were also discussed in detail with the patient. Opportunity provided to ask questions and answers provided to his apparent satisfaction. Provided instructions to call our clinic with any problems, questions or concerns prior to return visit. I recommended to continue follow-up with PCP and sub-specialists. He verbalized understanding and agreed with the plan.   NCCN guidelines have been consulted in the planning of this patient's care.  I spent a total of 42 minutes during this encounter with the patient including review of chart and various tests results, discussions about plan of care and coordination of care plan.   [name], MD  07/12/2024 9:57 AM  [facility] - A DEPT OF [facility] [address] [place] [address] Dept: [phone] Dept Fax: [phone]    CHIEF COMPLAINT/ REASON FOR VISIT:   Squamous cell carcinoma of the left tonsil/base of tongue. cT2,cN1,cM0,p16+, Stage 1 disease.  Concern for extranodal extension based on imaging.    Current Treatment:  Started concurrent chemoradiation with weekly cisplatin from 06/27/2024.  INTERVAL HISTORY:    Discussed the use of AI scribe software for clinical note transcription with the patient, who gave verbal consent to proceed.  History of Present Illness Gilbert Thompson is a 73 year old male who presents with nocturia and hydration concerns.  He experiences nocturia, waking up multiple times at night to urinate with significant volume. He can return to sleep quickly after each episode, but this pattern occurs approximately every hour, impacting his restfulness. Despite having had his prostate removed approximately eighteen months ago, he continues to experience frequent nighttime urination.  He drinks water  all  through the day, approximately fifteen to sixteen ounces over a twelve-hour period, to maintain hydration. He keeps a glass of water  by the toilet and takes a big sip when he wakes up.  He is currently undergoing chemotherapy. His creatinine level has increased from 1.16 to 1.44. He has not yet used his feeding tube, as he is still consuming nutrition orally, although he could improve his eating habits.  No fevers, chills, night sweats, ringing in the ears, hearing loss, tingling, or numbness.   I have reviewed the past medical history, past surgical history, social history and family history with the patient and they are unchanged from previous note.  HISTORY OF PRESENT ILLNESS:   ONCOLOGY HISTORY:   He presented to his PCP with complains of chronic pharyngitis and a left submandibular mass that has persisted over the past year.   To further investigate his symptoms, he underwent a CT neck and CT chest on 04/06/24 showing a 3.0 x 2.4 x 2.5 cm soft tissue mass centered in the left oropharynx at the glossotonsillar sulcus with possible involvement of the adjacent tongue base. 7 mm low-attenuation left level IB lymph node likely reflecting a site of nodal metastatic disease. 1.7 x 1.7 cm irregular soft tissue focus at the left level II station, inseparable from the adjacent sternocleidomastoid muscle. This is suspicious for nodal metastatic disease (with findings concerning for extracapsular extension) or a tumor deposit. 9 mm short-axis low attenuation left level III lymph node likely reflecting a site of nodal metastatic disease. Scans also noted scattered tiny pulmonary nodules are unchanged and likely benign but with only 5 months of documented stability but with recommend attention on follow-up given his history of prostate  cancer.    Subsequently, the patient was referred to Dr. Graig on 04/24/24 who performed a FNA biopsy of left neck. However, biopsy was inconclusive. As a result, he then  underwent a laryngoscopy with biopsy on 05/15/24 under the care of Dr. Lauralee. 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 Tumor is moderately differentiated with foci of poor differentiation and invasion into the striated muscle. Immunohistochemical studies are p16, p40, and HRHPV are positive.    He had a cervical spine MRI performed on 05/09/24 revealing no evidence of metastatic disease to the cervical spine itself but did indicated degenerative spondylosis at C4-5 and C5-6. 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 PET scan performed on 04/26/24 showing FDG avid mass along the left tongue base/oropharynx with left cervical chain nodal metastatic disease with no suspicious pulmonary or mediastinal FDG uptake.   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    Prior cancers, if any: history of prostate cancer.   Given extracapsular extension with lymph node positive disease, plan made to proceed with concurrent chemoradiation.   Plan made for weekly cisplatin with radiation, if renal function allows.  Otherwise we will use either docetaxel or carboplatin/paclitaxel regimen.  Started concurrent chemoradiation with weekly cisplatin from 06/27/2024.  Creatinine went up to 1.44 on 07/12/2024.  Dose reduced cisplatin by 50% with cycle 3 as a result.  Oncology History  Malignant neoplasm of prostate (HCC)  01/19/2023 Cancer Staging   Staging form: Prostate, AJCC 8th Edition - Clinical stage from 01/19/2023: Stage IIC (cT2a, cN0, cM0, PSA: 6, Grade Group: 4) - Signed by Sherwood Rise, PA-C on 03/30/2023 Histopathologic type: Adenocarcinoma, NOS Stage prefix: Initial diagnosis Prostate specific antigen (PSA) range: Less than 10 Gleason primary pattern: 4 Gleason secondary pattern: 4 Gleason score: 8 Histologic grading system: 5  grade system Number of biopsy cores examined: 14 Number of biopsy cores positive: 5 Location of positive needle core biopsies: One side   02/24/2023 Initial Diagnosis   Malignant neoplasm of prostate (HCC)   Primary squamous cell carcinoma of oropharynx (HCC)  06/07/2024 Initial Diagnosis   Malignant neoplasm of base of tongue (HCC)   06/07/2024 Cancer Staging   Staging form: Pharynx - HPV-Mediated Oropharynx, AJCC 8th Edition - Clinical stage from 06/07/2024: Stage I (cT2, cN1, cM0, p16+) - Signed by Izell Domino, MD on 06/07/2024 Stage prefix: Initial diagnosis   06/28/2024 -  Chemotherapy   Patient is on Treatment Plan : HEAD/NECK Cisplatin (40) q7d         REVIEW OF SYSTEMS:   Review of Systems - Oncology  All other pertinent systems were reviewed with the patient and are negative.  ALLERGIES: He has no known allergies.  MEDICATIONS:  Current Outpatient Medications  Medication Sig Dispense Refill   albuterol  (VENTOLIN  HFA) 108 (90 Base) MCG/ACT inhaler Inhale 1-2 puffs into the lungs every 6 (six) hours as needed for wheezing or shortness of breath.     allopurinol  (ZYLOPRIM ) 300 MG tablet Take 300 mg by mouth daily.     ALPRAZolam  (XANAX ) 1 MG tablet Take 1 mg by mouth at bedtime as needed for anxiety or sleep.     aspirin  EC 81 MG tablet Take 81 mg by mouth daily. Swallow whole.     celecoxib (CELEBREX) 200 MG capsule  (Patient taking differently: Not taking at this time)  cholecalciferol (VITAMIN D3) 25 MCG (1000 UNIT) tablet Take 1,000 Units by mouth daily.     dexamethasone  (DECADRON ) 4 MG tablet Take 2 tablets (8 mg) by mouth daily x 3 days starting the day after cisplatin chemotherapy. Take with food. 30 tablet 1   ezetimibe  (ZETIA ) 10 MG tablet TAKE 1 TABLET BY MOUTH DAILY 90 tablet 3   finasteride  (PROSCAR ) 5 MG tablet Take 1.25 mg by mouth daily.     HYDROcodone -acetaminophen  (NORCO/VICODIN) 5-325 MG tablet Take 1 tablet by mouth every 6 (six) hours as needed for  severe pain.     ibuprofen (ADVIL) 200 MG tablet Take 600 mg by mouth daily as needed for moderate pain (pain score 4-6).     lidocaine  (XYLOCAINE ) 2 % solution Patient: Mix 1part 2% viscous lidocaine , 1part H20. Swish & swallow 10mL of diluted mixture, 30min before meals and at bedtime, up to QID 200 mL 3   lidocaine -prilocaine  (EMLA ) cream Apply to affected area once 30 g 3   losartan  (COZAAR ) 50 MG tablet Take 1 tablet (50 mg total) by mouth daily. 90 tablet 3   metaxalone (SKELAXIN) 800 MG tablet Take 400 mg by mouth 3 (three) times daily as needed for muscle spasms.     nitroGLYCERIN  (NITROSTAT ) 0.4 MG SL tablet Place 1 tablet (0.4 mg total) under the tongue every 5 (five) minutes as needed for chest pain. 90 tablet 3   ondansetron  (ZOFRAN ) 8 MG tablet Take 1 tablet (8 mg total) by mouth every 8 (eight) hours as needed for nausea or vomiting. Start on the third day after cisplatin. 30 tablet 1   oxyCODONE  (OXY IR/ROXICODONE ) 5 MG immediate release tablet Take 1 tablet (5 mg total) by mouth every 6 (six) hours as needed. 90 tablet 0   pantoprazole  (PROTONIX ) 40 MG tablet Take 1 tablet (40 mg total) by mouth 2 (two) times daily. 180 tablet 3   polyethylene glycol (MIRALAX / GLYCOLAX) 17 g packet Take 17 g by mouth daily as needed for moderate constipation.     prochlorperazine  (COMPAZINE ) 10 MG tablet Take 1 tablet (10 mg total) by mouth every 6 (six) hours as needed (Nausea or vomiting). 30 tablet 1   rosuvastatin  (CRESTOR ) 40 MG tablet Take 1 tablet (40 mg total) by mouth daily. 90 tablet 3   Tetrahydrozoline HCl (REDNESS RELIEVER EYE DROPS OP) Place 1 drop into both eyes daily as needed (redness).     TRELEGY ELLIPTA 100-62.5-25 MCG/ACT AEPB Inhale 1 puff into the lungs daily.     No current facility-administered medications for this visit.   Facility-Administered Medications Ordered in Other Visits  Medication Dose Route Frequency Provider Last Rate Last Admin   0.9 %  sodium chloride   infusion   Intravenous Continuous Jaimen Melone, MD 10 mL/hr at 07/12/24 0948 New Bag at 07/12/24 0948   aprepitant (CINVANTI) injection 130 mg  130 mg Intravenous Once Silvia Markuson, MD       CISplatin (PLATINOL) 40 mg in sodium chloride  0.9 % 250 mL chemo infusion  20 mg/m2 (Treatment Plan Recorded) Intravenous Once Matias Thurman, MD       dexamethasone  (DECADRON ) injection 10 mg  10 mg Intravenous Once Marianne Golightly, MD       magnesium  sulfate IVPB 2 g 50 mL  2 g Intravenous Once Tiegan Jambor, MD 50 mL/hr at 07/12/24 0952 2 g at 07/12/24 0952   palonosetron (ALOXI) injection 0.25 mg  0.25 mg Intravenous Once Yeimi Debnam, Chinita, MD  VITALS:   Blood pressure (!) 115/52, pulse 78, temperature (!) 97.2 F (36.2 C), temperature source Temporal, resp. rate 16, height 5' 9 (1.753 m), weight 176 lb 6.4 oz (80 kg), SpO2 99%.  Wt Readings from Last 3 Encounters:  07/12/24 176 lb 6.4 oz (80 kg)  07/05/24 179 lb 8 oz (81.4 kg)  06/28/24 183 lb (83 kg)    Body mass index is 26.05 kg/m.   Onc Performance Status - 07/12/24 0857       ECOG Perf Status   ECOG Perf Status Restricted in physically strenuous activity but ambulatory and able to carry out work of a light or sedentary nature, e.g., light house work, office work      KPS SCALE   KPS % SCORE Able to carry on normal activity, minor s/s of disease           PHYSICAL EXAM:   Physical Exam Constitutional:      General: He is not in acute distress.    Appearance: Normal appearance.  HENT:     Head: Normocephalic and atraumatic.     Mouth/Throat:     Comments:  Abnormal tissue noted in the left glossotonsillar sulcus Eyes:     Conjunctiva/sclera: Conjunctivae normal.  Neck:     Comments:  2 cm, relatively firm mass at the angle of left jaw Cardiovascular:     Rate and Rhythm: Normal rate and regular rhythm.  Pulmonary:     Effort: Pulmonary effort is normal. No respiratory distress.  Chest:     Comments:  Right-sided Port-A-Cath in place without any signs of infection Abdominal:     General: There is no distension.     Comments: Feeding tube in place  Neurological:     General: No focal deficit present.     Mental Status: He is alert and oriented to person, place, and time.  Psychiatric:        Mood and Affect: Mood normal.        Behavior: Behavior normal.       LABORATORY DATA:   I have reviewed the data as listed.  Results for orders placed or performed in visit on 07/12/24  Magnesium   Result Value Ref Range   Magnesium  2.0 1.7 - 2.4 mg/dL  Basic Metabolic Panel - Cancer Center Only  Result Value Ref Range   Sodium 135 135 - 145 mmol/L   Potassium 3.4 (L) 3.5 - 5.1 mmol/L   Chloride 101 98 - 111 mmol/L   CO2 28 22 - 32 mmol/L   Glucose, Bld 139 (H) 70 - 99 mg/dL   BUN 27 (H) 8 - 23 mg/dL   Creatinine 8.55 (H) 9.38 - 1.24 mg/dL   Calcium  9.9 8.9 - 10.3 mg/dL   GFR, Estimated 51 (L) >60 mL/min   Anion gap 6 5 - 15  CBC with Differential (Cancer Center Only)  Result Value Ref Range   WBC Count 8.4 4.0 - 10.5 K/uL   RBC 4.31 4.22 - 5.81 MIL/uL   Hemoglobin 13.7 13.0 - 17.0 g/dL   HCT 61.9 (L) 60.9 - 47.9 %   MCV 88.2 80.0 - 100.0 fL   MCH 31.8 26.0 - 34.0 pg   MCHC 36.1 (H) 30.0 - 36.0 g/dL   RDW 87.6 88.4 - 84.4 %   Platelet Count 140 (L) 150 - 400 K/uL   nRBC 0.0 0.0 - 0.2 %   Neutrophils Relative % 93 %   Neutro Abs 7.7 1.7 - 7.7  K/uL   Lymphocytes Relative 3 %   Lymphs Abs 0.3 (L) 0.7 - 4.0 K/uL   Monocytes Relative 4 %   Monocytes Absolute 0.3 0.1 - 1.0 K/uL   Eosinophils Relative 0 %   Eosinophils Absolute 0.0 0.0 - 0.5 K/uL   Basophils Relative 0 %   Basophils Absolute 0.0 0.0 - 0.1 K/uL   Immature Granulocytes 0 %   Abs Immature Granulocytes 0.03 0.00 - 0.07 K/uL  Results for orders placed or performed in visit on 07/12/24  Rad Onc Aria Session Summary  Result Value Ref Range   Course ID [id number]    Course Start Date 06/19/2024    Session Number 12     Course First Treatment Date 06/27/2024  2:31 PM    Course Last Treatment Date 07/12/2024  8:17 AM    Course Elapsed Days 15    Reference Point ID HN_Oroph dp    Reference Point Dosage Given to Date 24 Gy   Reference Point Session Dosage Given 2 Gy   Plan ID HN_Oroph    Plan Name HN_Oroph    Plan Fractions Treated to Date 12    Plan Total Fractions Prescribed 35    Plan Prescribed Dose Per Fraction 2 Gy   Plan Total Prescribed Dose 70.000000 Gy   Plan Primary Reference Point HN_Oroph dp       RADIOGRAPHIC STUDIES:  I have personally reviewed the radiological images as listed and agree with the findings in the report.  IR GASTROSTOMY TUBE MOD SED Result Date: 06/23/2024 INDICATION: he will receive chemo/radiation for head and neck cancer EXAM: Procedures: 1. FLUOROSCOPIC NASOGASTRIC TUBE PLACEMENT 2. PERCUTANEOUS GASTROSTOMY FEEDING TUBE PLACEMENT COMPARISON:  PET-CT, 04/26/2024 and 02/22/2023. CT chest, 04/06/2024 MEDICATIONS: Ancef  2 gm IV; Antibiotics were administered within 1 hour of the procedure. Glucagon 1 mg IV CONTRAST:  15 mL of Isovue  300 administered into the gastric lumen. ANESTHESIA/SEDATION: Moderate (conscious) sedation was employed during this procedure. A total of Versed  1 mg and Fentanyl  50 mcg was administered intravenously. Moderate Sedation Time: 15 minutes. The patient's level of consciousness and vital signs were monitored continuously by radiology nursing throughout the procedure under my direct supervision. FLUOROSCOPY: Radiation Exposure Index and estimated peak skin dose (PSD); Reference air kerma (RAK), 7 mGy. COMPLICATIONS: None immediate. PROCEDURE: Informed written consent was obtained from the patient and/or patient's representative following explanation of the procedure, risks, benefits and alternatives. A time out was performed prior to the initiation of the procedure. Maximal barrier sterile technique utilized including caps, mask, sterile gowns, sterile  gloves, large sterile drape, hand hygiene and sterile prep. The LEFT upper quadrant was sterilely prepped and draped. A oral gastric catheter was inserted into the stomach under fluoroscopy. The LEFT costal margin and barium opacified transverse colon were identified and avoided. Air was injected into the stomach for insufflation and visualization under fluoroscopy. Under sterile conditions and local anesthesia, 2 T tacks were utilized to pexy the anterior aspect of the stomach against the ventral abdominal wall. Contrast injection confirmed appropriate positioning of each of the T tacks. An incision was made between the T tacks and a 17 gauge trocar needle was utilized to access the stomach. Needle position was confirmed within the stomach with aspiration of air and injection of a small amount of contrast. A stiff guidewire was advanced into the gastric lumen and under intermittent fluoroscopic guidance, the access needle was exchanged for a telescoping peel-away sheath, ultimately allowing placement of a 14 Fr balloon retention  gastrostomy tube. The retention balloon was insufflated with a mixture of dilute saline and contrast and pulled taut against the anterior wall of the stomach. The external disc was cinched. Contrast injection confirms positioning within the stomach. Several spot radiographic images were obtained in various obliquities for documentation. The patient tolerated procedure well without immediate post procedural complication. FINDINGS: After successful fluoroscopic guided placement, the gastrostomy tube is appropriately positioned with internal retention balloon against the ventral aspect of the gastric lumen. IMPRESSION: Successful fluoroscopic insertion of a 14 Fr balloon retention gastrostomy tube. The gastrostomy may be used immediately for medication administration and in 4 hrs for the initiation of feeds. RECOMMENDATIONS: The patient will return to Vascular [facility]iology (VIR)  for routine feeding tube evaluation and exchange in 6 months. Thom Hall, MD [facility] Electronically Signed   By: Thom Hall M.D.   On: 06/23/2024 14:41   IR IMAGING GUIDED PORT INSERTION Result Date: 06/23/2024 INDICATION: he will receive chemo/radiation for head and neck cancer EXAM: IMPLANTED PORT A CATH PLACEMENT WITH ULTRASOUND AND FLUOROSCOPIC GUIDANCE MEDICATIONS: None ANESTHESIA/SEDATION: Moderate (conscious) sedation was employed during this procedure. A total of Versed  3 mg and Fentanyl  150 mcg was administered intravenously. Moderate Sedation Time: 20 minutes. The patient's level of consciousness and vital signs were monitored continuously by radiology nursing throughout the procedure under my direct supervision. FLUOROSCOPY: Radiation Exposure Index and estimated peak skin dose (PSD); Reference air kerma (RAK), 0 mGy. COMPLICATIONS: None immediate. PROCEDURE: The procedure, risks, benefits, and alternatives were explained to the patient. Questions regarding the procedure were encouraged and answered. The patient understands and consents to the procedure. The RIGHT neck and chest were prepped with chlorhexidine  in a sterile fashion, and a sterile drape was applied covering the operative field. Maximum barrier sterile technique with sterile gowns and gloves were used for the procedure. A timeout was performed prior to the initiation of the procedure. Local anesthesia was provided with 1% lidocaine  with epinephrine . After creating a small venotomy incision, a micropuncture kit was utilized to access the internal jugular vein under direct, real-time ultrasound guidance. Ultrasound image documentation was performed. The microwire was kinked to measure appropriate catheter length. A subcutaneous port pocket was then created along the upper chest wall utilizing a combination of sharp and blunt dissection. The pocket was irrigated with sterile  saline. A single lumen power injectable port was chosen for placement. The 8 Fr catheter was tunneled from the port pocket site to the venotomy incision. The port was placed in the pocket. The external catheter was trimmed to appropriate length. At the venotomy, an 8 Fr peel-away sheath was placed over a guidewire under fluoroscopic guidance. The catheter was then placed through the sheath and the sheath was removed. Final catheter positioning was confirmed and documented with a fluoroscopic spot radiograph. The port was accessed with a Huber needle, aspirated and flushed with heparinized saline. The port pocket incision was closed with interrupted 3-0 Vicryl suture then Dermabond was applied, including at the venotomy incision. Dressings were placed. The patient tolerated the procedure well without immediate post procedural complication. IMPRESSION: Successful placement of a RIGHT internal jugular approach power injectable Port-A-Cath. The tip of the catheter is positioned within the proximal RIGHT atrium. The catheter is ready for immediate use. Thom Hall, MD [facility] Radiology Electronically Signed   By: Thom Hall M.D.   On: 06/23/2024 13:41     CODE STATUS:  Code Status History  Date Active Date Inactive Code Status Order ID Comments User Context   06/23/2024 1257 06/24/2024 0510 Full Code [id number]  Hughes Simmonds, MD HOV   06/23/2024 1257 06/23/2024 1257 Full Code [id number]  Hughes Simmonds, MD HOV   04/29/2023 1830 04/30/2023 1811 Full Code [id number]  Renda Glance, MD Inpatient   06/05/2022 0846 06/05/2022 1634 Full Code [id number]  Claudene Victory ORN, MD Inpatient    Questions for Most Recent Historical Code Status (Order [id number])     Question Answer   By: Consent: discussion documented in EHR            No orders of the defined types were placed in this encounter.    Future Appointments  Date Time Provider Department Center  07/12/2024  12:00 PM Ivonne Harlene RAMAN, RD [facility] None  07/13/2024  9:45 AM [facility] [id number] [facility] None  07/14/2024  9:45 AM [facility] [id number] [facility] None  07/17/2024  9:45 AM [facility] [id number] [facility] None  07/17/2024 10:00 AM LINAC-SQUIRE [facility] None  07/18/2024  9:45 AM [facility] [id number] [facility] None  07/19/2024  8:45 AM [facility] FLUSH [facility] None  07/19/2024  9:15 AM Zosia Lucchese, MD [facility] None  07/19/2024  9:45 AM [facility] LINAC 3 [facility] None  07/20/2024  7:30 AM [facility] INFUSION [facility] None  07/20/2024  2:30 PM Neff, Barbara L, RD [facility] None  07/20/2024  3:15 PM [facility] [id number] [facility] None  07/21/2024  9:45 AM [facility] LINAC 3 [facility] None  07/24/2024 10:00 AM [facility] LINAC 3 [facility] None  07/25/2024 10:00 AM [facility] LINAC 3 [facility] None  07/26/2024  7:30 AM [facility] LINAC 3 [facility] None  07/26/2024  8:00 AM [facility] FLUSH [facility] None  07/26/2024  9:00 AM Shir Bergman, MD [facility] None  07/26/2024  9:30 AM [facility] INFUSION [facility] None  07/26/2024  9:30 AM Ivonne Harlene RAMAN, RD [facility] None  07/27/2024 10:00 AM [facility] LINAC 3 [facility] None  07/28/2024 10:00 AM [facility] LINAC 3 [facility] None  07/31/2024 10:00 AM [facility] LINAC 3 [facility] None  08/01/2024 10:00 AM [facility] LINAC 3 [facility] None  08/02/2024  8:15 AM [facility] [id number] [facility] None  08/02/2024  8:45 AM [facility] FLUSH [facility] None  08/02/2024  9:15 AM Toniette Devera, MD [facility] None  08/02/2024 10:00 AM [facility] INFUSION [facility] None  08/02/2024 11:15 AM Ivonne Harlene RAMAN, RD [facility] None  08/03/2024 10:00 AM [facility] LINAC 3 [facility] None  08/04/2024 10:00 AM [facility] LINAC 3 [facility] None  08/07/2024 10:00 AM [facility] LINAC 3 [facility] None  08/08/2024 10:00 AM [facility] LINAC 3 [facility] None  08/09/2024  10:00 AM [facility] LINAC 3 [facility] None  08/10/2024 10:00 AM [facility] LINAC 3 [facility] None  08/11/2024 10:00 AM [facility] LINAC 3 [facility] None  08/14/2024 10:00 AM [facility] LINAC 3 [facility] None  09/06/2024 10:00 AM Breedlove Blue, Blaire L, PT [facility] None  12/22/2024  3:30 PM [facility] 1 [facility] [place]      This document was completed utilizing speech recognition software. Grammatical errors, random word insertions, pronoun errors, and incomplete sentences are an occasional consequence of this system due to software limitations, ambient noise, and hardware issues. Any formal questions or concerns about the content, text or information contained within the body of this dictation should be directly addressed to the provider for clarification.

## 2024-07-12 NOTE — Progress Notes (Signed)
 Cisplatin reduced 50% and pt to receive IVF MWF d/t increase in Scr to 1.44 today per Dr. Autumn.  Candy Glatter, PharmD, [facility]

## 2024-07-12 NOTE — Progress Notes (Signed)
 Nutrition Follow-up:  Pt with primary SCC of oropharynx, p16+.  He is receiving concurrent chemoradiation with weekly cisplatin (start RT 10/7). Patient is under the care of Dr. Izell and Dr. Autumn.    S/p PEG - 10/3  Dose reduced today d/t labs   Met with patient in infusion. Reports tolerating regular diet and eating about the same as usual. Taste is off. Wife has been making smoothies and shakes (ice cream + Ensure). Wife brought lunch for patient. He had a couple bites of soup, half piece of banana bread, and trifle. Untouched half sandwich. He had blueberry yogurt for breakfast. Patient likes the Ensure. Usually has one daily. Patient reports drinking 80-100 ounces of water . Constipation resolved with miralax. He denies nausea or vomiting.   Patient providing daily PEG care. Giving FWF as instructed. He is agreeable to bolus feeding during infusion.   Medications: reviewed   Labs: K 3.4, glucose 139, BUN 27, Cr 1.44  Anthropometrics: Wt 176 lb 6.4 oz today decreased 3.8% in 2 weeks - this is severe  10/15 - 179 lb 8 oz 10/8 - 183 lb   Estimated Energy Needs  Kcals: 2160-2330 Protein: 99-116 Fluid: >2.1 L  NUTRITION DIAGNOSIS: Predicted sub-optimal intake continues - pt drinking ONS   INTERVENTION:  Bolus education provided. Pt gave 60 ml water  flush, 100 ml Osmolite 1.5 followed by 60 ml water  flush - tolerated well Pt agreeable to Ensure Complete x2 in between meals - samples of Ensure Complete + Boost VHC Encourage high calorie high protein foods to support metabolic demands - agreeable to supplement with TF if po intake declines Continue daily Gatorade - educated on bolus if not tolerating po Continue miralax for constipation  Additional hydration with IVF 2x/week per MD   Goal:  Osmolite 1.5 - 7 cartons/day provides 2485 kcal, 104.3 g, 1267 ml free water  120 ml QID + additional 474 ml water  po or tube to meet hydration (2221 ml total water )  MONITORING, EVALUATION,  GOAL: wt trends, intake   NEXT VISIT: Thursday October 30 during infusion with Heron

## 2024-07-13 ENCOUNTER — Other Ambulatory Visit: Payer: Self-pay

## 2024-07-13 ENCOUNTER — Ambulatory Visit
Admission: RE | Admit: 2024-07-13 | Discharge: 2024-07-13 | Disposition: A | Discharge status: Home / Self Care | Source: Ambulatory Visit | Attending: Radiation Oncology | Admitting: Radiation Oncology

## 2024-07-13 DIAGNOSIS — Z510 Encounter for antineoplastic radiation therapy: Secondary | ICD-10-CM | POA: Diagnosis not present

## 2024-07-13 LAB — RAD ONC ARIA SESSION SUMMARY
Course Elapsed Days: 16
Plan Fractions Treated to Date: 13
Plan Prescribed Dose Per Fraction: 2 Gy
Plan Total Fractions Prescribed: 35
Plan Total Prescribed Dose: 70 Gy
Reference Point Dosage Given to Date: 26 Gy
Reference Point Session Dosage Given: 2 Gy
Session Number: 13

## 2024-07-14 ENCOUNTER — Other Ambulatory Visit: Payer: Self-pay

## 2024-07-14 ENCOUNTER — Ambulatory Visit
Admission: RE | Admit: 2024-07-14 | Discharge: 2024-07-14 | Disposition: A | Discharge status: Home / Self Care | Source: Ambulatory Visit | Attending: Radiation Oncology | Admitting: Radiation Oncology

## 2024-07-14 DIAGNOSIS — Z510 Encounter for antineoplastic radiation therapy: Secondary | ICD-10-CM | POA: Diagnosis not present

## 2024-07-14 LAB — RAD ONC ARIA SESSION SUMMARY
Course Elapsed Days: 17
Plan Fractions Treated to Date: 14
Plan Prescribed Dose Per Fraction: 2 Gy
Plan Total Fractions Prescribed: 35
Plan Total Prescribed Dose: 70 Gy
Reference Point Dosage Given to Date: 28 Gy
Reference Point Session Dosage Given: 2 Gy
Session Number: 14

## 2024-07-17 ENCOUNTER — Ambulatory Visit
Admission: RE | Admit: 2024-07-17 | Discharge: 2024-07-17 | Disposition: A | Discharge status: Home / Self Care | Source: Ambulatory Visit | Attending: Radiation Oncology | Admitting: Radiation Oncology

## 2024-07-17 ENCOUNTER — Inpatient Hospital Stay

## 2024-07-17 ENCOUNTER — Ambulatory Visit
Admission: RE | Admit: 2024-07-17 | Discharge: 2024-07-17 | Disposition: A | Source: Ambulatory Visit | Attending: Radiation Oncology

## 2024-07-17 ENCOUNTER — Other Ambulatory Visit: Payer: Self-pay

## 2024-07-17 ENCOUNTER — Encounter: Payer: Self-pay | Admitting: Oncology

## 2024-07-17 VITALS — BP 112/63 | HR 117 | Temp 97.7°F | Resp 18

## 2024-07-17 DIAGNOSIS — C109 Malignant neoplasm of oropharynx, unspecified: Secondary | ICD-10-CM

## 2024-07-17 DIAGNOSIS — Z510 Encounter for antineoplastic radiation therapy: Secondary | ICD-10-CM | POA: Diagnosis not present

## 2024-07-17 DIAGNOSIS — C090 Malignant neoplasm of tonsillar fossa: Secondary | ICD-10-CM | POA: Diagnosis not present

## 2024-07-17 DIAGNOSIS — Z5111 Encounter for antineoplastic chemotherapy: Secondary | ICD-10-CM | POA: Diagnosis not present

## 2024-07-17 LAB — RAD ONC ARIA SESSION SUMMARY
Course Elapsed Days: 20
Plan Fractions Treated to Date: 15
Plan Prescribed Dose Per Fraction: 2 Gy
Plan Total Fractions Prescribed: 35
Plan Total Prescribed Dose: 70 Gy
Reference Point Dosage Given to Date: 30 Gy
Reference Point Session Dosage Given: 2 Gy
Session Number: 15

## 2024-07-17 MED ORDER — SODIUM CHLORIDE 0.9 % IV SOLN: Freq: Once | INTRAVENOUS | Status: AC

## 2024-07-17 NOTE — Patient Instructions (Signed)
 Fluids Given Through an IV (IV Infusion Therapy): What to Expect IV infusion therapy is a treatment to deliver a fluid, called an infusion, into a vein. You may have IV infusion to get: Fluids. Medicines. Nutrition. Chemotherapy. This is medicines to stop or slow down cancer cells. Blood or blood products. Dye that is given before an MRI or a CT scan. This is called contrast dye. Tell a health care provider about: Any allergies you have. This includes allergies to anesthesia or dyes. All medicines you take. These include vitamins, herbs, eye drops, and creams. Any bleeding problems you have. Any surgeries you've had, including if you've had lymph nodes taken out of your armpit or if you have a arteriovenous fistula for dialysis. Any medical problems you have. Whether you're pregnant or may be pregnant. Whether you've used IV drugs. What are the risks? Your health care provider will talk with you about risks. These may include: Pain, bruising, or bleeding. Infection. The IV leaking or moving out of place. Damage to blood vessels or nerves. Allergic reactions to medicines or dyes. A blood clot. An air bubble in the vein, also called an air embolism. What happens before the procedure? Eat and drink only as you've been told. Ask about changing or stopping: Any medicines you take. Any vitamins, herbs, or supplements you take. What happens during the procedure?     Placing the catheter Your skin at the IV site will be washed with fluid that kills germs. This will help prevent infection. IV infusion therapy starts with a procedure to place a soft tube called a catheter into a vein. An IV tube will be attached to the catheter to let the infusion flow into your blood. Your catheter may be placed: Into a vein that is usually in the bend of the elbow, forearm, or back of the hand. This is called a peripheral IV catheter. This may need to be put into a vein each time you get an  infusion. Into a vein near your elbow. This is called a midline catheter or a peripherally inserted central catheter (PICC). These types of catheters may stay in place for weeks or months at a time so you can receive repeated infusions through it. Into a vein near your neck that leads to your heart. This is called a non-tunneled catheter. This is only used for short amounts of time because it can cause infection. Through the skin of your chest and into a large vein that leads to your heart. This is called a tunneled catheter. This may stay in your body for months or years. Into an implanted port. An implanted port is a device that is surgically inserted under the skin of the chest to provide long-term IV access. The catheter will connect the port to a large vein in the chest or upper arm. A port may be kept in place for many months or years. Each time you have an infusion, a needle will be inserted through your skin to connect the catheter to the port. Doing the infusion To start the infusion, your provider will: Attach the IV tubing to your catheter. Use a tape or a bandage to hold the IV in place against your skin. An IV pump may be used to control the flow of the IV infusion. During the infusion, your provider will check the area to make sure: There is no bleeding, swelling, or pain. Your IV infusion is flowing correctly. After the infusion, your provider will: Take off the bandage  or tape. Disconnect the tubing from the catheter. Remove the catheter, if you have a peripheral IV. Apply pressure over the IV insertion site to stop bleeding, then cover the area with a bandage. If you have an implanted port, PICC, non-tunneled, or tunneled catheter, your catheter may remain in place. This depends on how many times you will need treatment, your medical condition, and what type of catheter you have. These steps may vary. Ask what you can expect. What can I expect after the procedure? You may be  watched closely until you leave. This includes checking your pain level, blood pressure, heart rate, and breathing rate. Your provider will check to make sure there are no signs of infection. Follow these instructions at home: Take your medicines only as told. Change or take off your bandage as told by your provider. Ask what things are safe for you to do at home. Ask when you can go back to work or school. Do not take baths, swim, or use a hot tub until you're told it's OK. Ask if you can shower. Check your IV insertion site every day for signs of infection. Check for: Redness, swelling, or pain. Fluid or blood. If fluid or blood drains from your IV site, use your hands to press down firmly on the area for a minute or two. Doing this should stop the bleeding. Warmth. Pus or a bad smell. Contact a health care provider if: You have signs of infection around your IV site. You have fluid or blood coming from your IV site that does not stop after you put pressure to the site. You have a rash or blisters. You have itchy, red, swollen areas of skin called hives. Get help right away if: You have a fever or chills. You have chest pain. You have trouble breathing. This information is not intended to replace advice given to you by your health care provider. Make sure you discuss any questions you have with your health care provider. Document Revised: 03/02/2023 Document Reviewed: 03/02/2023 Elsevier Patient Education  2024 [employer].

## 2024-07-18 ENCOUNTER — Ambulatory Visit
Admission: RE | Admit: 2024-07-18 | Discharge: 2024-07-18 | Disposition: A | Source: Ambulatory Visit | Attending: Radiation Oncology

## 2024-07-18 ENCOUNTER — Other Ambulatory Visit: Payer: Self-pay

## 2024-07-18 DIAGNOSIS — Z510 Encounter for antineoplastic radiation therapy: Secondary | ICD-10-CM | POA: Diagnosis not present

## 2024-07-18 LAB — RAD ONC ARIA SESSION SUMMARY
Course Elapsed Days: 21
Plan Fractions Treated to Date: 16
Plan Prescribed Dose Per Fraction: 2 Gy
Plan Total Fractions Prescribed: 35
Plan Total Prescribed Dose: 70 Gy
Reference Point Dosage Given to Date: 32 Gy
Reference Point Session Dosage Given: 2 Gy
Session Number: 16

## 2024-07-19 ENCOUNTER — Other Ambulatory Visit: Payer: Self-pay

## 2024-07-19 ENCOUNTER — Inpatient Hospital Stay

## 2024-07-19 ENCOUNTER — Ambulatory Visit
Admission: RE | Admit: 2024-07-19 | Discharge: 2024-07-19 | Disposition: A | Discharge status: Home / Self Care | Source: Ambulatory Visit | Attending: Radiation Oncology | Admitting: Radiation Oncology

## 2024-07-19 ENCOUNTER — Encounter: Payer: Self-pay | Admitting: Oncology

## 2024-07-19 ENCOUNTER — Inpatient Hospital Stay: Admitting: Oncology

## 2024-07-19 ENCOUNTER — Other Ambulatory Visit (HOSPITAL_COMMUNITY): Payer: Self-pay

## 2024-07-19 VITALS — BP 102/58 | HR 98 | Temp 97.7°F | Resp 17 | Wt 173.7 lb

## 2024-07-19 DIAGNOSIS — Z510 Encounter for antineoplastic radiation therapy: Secondary | ICD-10-CM | POA: Diagnosis not present

## 2024-07-19 DIAGNOSIS — Z5111 Encounter for antineoplastic chemotherapy: Secondary | ICD-10-CM | POA: Diagnosis not present

## 2024-07-19 DIAGNOSIS — C109 Malignant neoplasm of oropharynx, unspecified: Secondary | ICD-10-CM | POA: Diagnosis not present

## 2024-07-19 DIAGNOSIS — G893 Neoplasm related pain (acute) (chronic): Secondary | ICD-10-CM

## 2024-07-19 LAB — BASIC METABOLIC PANEL - CANCER CENTER ONLY
Anion gap: 6 (ref 5–15)
BUN: 17 mg/dL (ref 8–23)
CO2: 26 mmol/L (ref 22–32)
Calcium: 9.1 mg/dL (ref 8.9–10.3)
Chloride: 104 mmol/L (ref 98–111)
Creatinine: 1.2 mg/dL (ref 0.61–1.24)
GFR, Estimated: >60 mL/min (ref >60)
Glucose, Bld: 165 mg/dL — ABNORMAL HIGH (ref 70–99)
Potassium: 3.2 mmol/L — ABNORMAL LOW (ref 3.5–5.1)
Sodium: 136 mmol/L (ref 135–145)

## 2024-07-19 LAB — CBC WITH DIFFERENTIAL (CANCER CENTER ONLY)
Abs Immature Granulocytes: 0.01 K/uL (ref 0.00–0.07)
Basophils Absolute: 0 K/uL (ref 0.0–0.1)
Basophils Relative: 0 %
Eosinophils Absolute: 0 K/uL (ref 0.0–0.5)
Eosinophils Relative: 0 %
HCT: 36.8 % — ABNORMAL LOW (ref 39.0–52.0)
Hemoglobin: 13.2 g/dL (ref 13.0–17.0)
Immature Granulocytes: 0 %
Lymphocytes Relative: 11 %
Lymphs Abs: 0.4 K/uL — ABNORMAL LOW (ref 0.7–4.0)
MCH: 31.7 pg (ref 26.0–34.0)
MCHC: 35.9 g/dL (ref 30.0–36.0)
MCV: 88.5 fL (ref 80.0–100.0)
Monocytes Absolute: 0.1 K/uL (ref 0.1–1.0)
Monocytes Relative: 3 %
Neutro Abs: 3.1 K/uL (ref 1.7–7.7)
Neutrophils Relative %: 86 %
Platelet Count: 112 K/uL — ABNORMAL LOW (ref 150–400)
RBC: 4.16 MIL/uL — ABNORMAL LOW (ref 4.22–5.81)
RDW: 12.5 % (ref 11.5–15.5)
WBC Count: 3.6 K/uL — ABNORMAL LOW (ref 4.0–10.5)
nRBC: 0 % (ref 0.0–0.2)

## 2024-07-19 LAB — RAD ONC ARIA SESSION SUMMARY
Course Elapsed Days: 22
Plan Fractions Treated to Date: 17
Plan Prescribed Dose Per Fraction: 2 Gy
Plan Total Fractions Prescribed: 35
Plan Total Prescribed Dose: 70 Gy
Reference Point Dosage Given to Date: 34 Gy
Reference Point Session Dosage Given: 2 Gy
Session Number: 17

## 2024-07-19 LAB — MAGNESIUM: Magnesium: 1.7 mg/dL (ref 1.7–2.4)

## 2024-07-19 MED ORDER — OXYCODONE HCL 10 MG PO TABS: 10.0000 mg | ORAL_TABLET | Freq: Four times a day (QID) | ORAL | 0 refills | Status: DC | PRN

## 2024-07-19 MED ORDER — NYSTATIN 100000 UNIT/ML MT SUSP
5.0000 mL | Freq: Four times a day (QID) | OROMUCOSAL | 2 refills | Status: DC | PRN
  Filled 2024-07-19 – 2024-07-26 (×2): qty 140, 7d supply, fill #0
  Filled 2024-08-02: qty 140, 7d supply, fill #1
  Filled 2024-08-12: qty 140, 7d supply, fill #2

## 2024-07-19 NOTE — Assessment & Plan Note (Signed)
 Experiencing worsening pain, not adequately controlled with current oxycodone  regimen. Oxycodone  causing gastrointestinal side effects, including nausea and appetite suppression. - Increased oxycodone  to 10 mg tablets - Instructed to take two 5 mg tablets every 4-6 hours as needed until new prescription is filled - Sent new prescription for 10 mg oxycodone  to pharmacy

## 2024-07-19 NOTE — Progress Notes (Signed)
 [facility]  ONCOLOGY CLINIC PROGRESS NOTE   Patient Care Team: Windy Coy, MD as PCP - General (Family Medicine) Pietro, Redell RAMAN, MD as PCP - Cardiology (Cardiology) Vertell Pont, RN as Oncology Nurse Navigator Renda Glance, MD as Consulting Physician (Urology) Patrcia Cough, MD as Consulting Physician (Radiation Oncology) Starla Wendelyn BIRCH, RN as Registered Nurse Izell Domino, MD as Consulting Physician (Radiation Oncology) Autumn Millman, MD as Consulting Physician (Oncology) Lauralee Chew, MD as Referring Physician (Otolaryngology) Malmfelt, Delon CROME, RN as Oncology Nurse Navigator  PATIENT NAME: Gilbert Thompson   MR#: [id number] DOB: 27-Dec-1950  Date of visit: 07/19/2024   ASSESSMENT & PLAN:   Gilbert Thompson is a 73 y.o. 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  Concern for extranodal extension based on imaging.  Started concurrent chemoradiation with weekly cisplatin from 06/27/2024.  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   CT scan from July 2025 showed lymph nodes with extranodal extension, indicating a higher risk of recurrence without appropriate treatment. The cancer is aggressive due to lymph node involvement but has not spread beyond the head and neck area.   cT2,cN1,cM0,p16+, Stage 1 disease.  Concern for extranodal extension based on imaging.  Previously I discussed staging, prognosis, plan of care, treatment options.  Reviewed NCCN guidelines.  We have discussed about role of cisplatin being a radiosensitizer in the treatment of head and neck cancer.  We have discussed  about the curative intent of chemoradiation for this patient.  Patient is willing to proceed with weekly cisplatin.  Emphasized the importance of hydration to protect kidney function. The treatment plan includes seven weeks of radiation and weekly chemotherapy, with the possibility of holding chemotherapy towards the end if side effects become problematic. The role of chemotherapy is supportive, making radiation more effective.   Last week creatinine was increased at 1.44.  Hence we dose reduce cisplatin by 50% with cycle 3.  Labs today showed creatinine of 1.2, improved with normal eGFR.  Hence we will proceed with standard dose of cisplatin at 40 mg/m with cycle 4 tomorrow.  White count 3600 but ANC is 3100.  Platelet 112,000.  Overall no dose-limiting toxicities.  We will arrange for IV fluids on Monday, Wednesday, Friday each week, going forward to maintain his creatinine at a safe level.   He is to continue radiation treatments as scheduled.    We again emphasized the importance of hydration with him.  He has been making a good effort and maintaining hydration.  - Monitor kidney function and electrolytes weekly during treatment - Previously referred to physical therapy, speech language pathology, and nutritionist for supportive care  RTC in 1 week for labs, follow-up and continuation of chemotherapy.  Chemotherapy-induced thrombocytopenia and leukopenia Platelet count is trending down but remains above the threshold for treatment interruption. White blood cell count is slightly below normal but neutrophil count is sufficient for treatment continuation. - Continue to monitor blood counts - Will consider booster injection if white count drops too low  Chemotherapy-induced hypokalemia Potassium levels are low at 3.2, likely due to chemotherapy-induced urinary loss. - Administered potassium through IV fluids - Provided oral potassium supplementation - Encouraged dietary intake of  potassium-rich foods such as bananas and orange juice  Oral mucositis due to chemotherapy and radiation Experiencing oral mucositis with redness in the throat. Using baking soda and salt mouthwash for relief. - Prescribed magic mouthwash with lidocaine  for oral mucositis - Continue using baking soda and salt mouthwash  Epistaxis Experiencing epistaxis, likely due to dry air and low humidity rather than thrombocytopenia. - Use saline spray to moisturize nostrils - Maintain humidity in home environment  I reviewed lab results and outside records for this visit and discussed relevant results with the patient. Diagnosis, plan of care and treatment options were also discussed in detail with the patient. Opportunity provided to ask questions and answers provided to his apparent satisfaction. Provided instructions to call our clinic with any problems, questions or concerns prior to return visit. I recommended to continue follow-up with PCP and sub-specialists. He verbalized understanding and agreed with the plan.   NCCN guidelines have been consulted in the planning of this patient's care.  I spent a total of 42 minutes during this encounter with the patient including review of chart and various tests results, discussions about plan of care and coordination of care plan.   [name], MD  07/19/2024 2:23 PM  [facility] - [facility] DE[facility] [address] [place] [address] Dept: [phone] Dept Fax: [phone]    CHIEF COMPLAINT/ REASON FOR VISIT:   Squamous cell carcinoma of the left tonsil/base of tongue. cT2,cN1,cM0,p16+, Stage 1 disease.  Concern for extranodal extension based on imaging.    Current Treatment:  Started concurrent chemoradiation with weekly cisplatin from 06/27/2024.  INTERVAL HISTORY:    Discussed the use of AI scribe software for clinical note transcription with the patient, who gave  verbal consent to proceed.  History of Present Illness  Gilbert Thompson is a 73 year old male with cancer who presents with worsening pain and side effects from treatment.  He is undergoing cancer treatment and experiencing worsening pain. Oxycodone  5 mg is no longer effective, even when he takes multiple tablets at a time. He is concerned about running out of medication due to limited supply.  He experienced epistaxis (nosebleed) last night, and uses a humidifier and saline spray to manage nasal dryness. He uses a humidifier and saline spray to manage nasal dryness.  His appetite is poor, which he believes may be due to the side effects of pain medication and cancer treatments. He is concerned about the impact of these medications on his stomach and appetite.  He receives regular hydration and drinks over 100 ounces of water  daily, and receives IV fluids on treatment days. He drinks over 100 ounces of water  daily and receives IV fluids on treatment days.  His blood counts are monitored closely. His white blood cell count is slightly below normal, but his neutrophil count remains safe for treatment. His platelet count is trending down but is still at a safe level for continuing chemotherapy.  He is experiencing low potassium levels, likely due to chemotherapy, and consumes potassium-rich foods like bananas and orange juice. He also receives potassium supplementation through IV fluids and oral pills.  He uses a lidocaine  mouthwash to manage oral discomfort and sores, which have been exacerbated by treatment. He also uses a baking soda and salt rinse and a non-alcoholic mouthwash to maintain oral hygiene.   I have reviewed the past medical history, past surgical history, social history and family  history with the patient and they are unchanged from previous note.  HISTORY OF PRESENT ILLNESS:   ONCOLOGY HISTORY:   He presented to his PCP with complains of chronic pharyngitis and a left  submandibular mass that has persisted over the past year.   To further investigate his symptoms, he underwent a CT neck and CT chest on 04/06/24 showing a 3.0 x 2.4 x 2.5 cm soft tissue mass centered in the left oropharynx at the glossotonsillar sulcus with possible involvement of the adjacent tongue base. 7 mm low-attenuation left level IB lymph node likely reflecting a site of nodal metastatic disease. 1.7 x 1.7 cm irregular soft tissue focus at the left level II station, inseparable from the adjacent sternocleidomastoid muscle. This is suspicious for nodal metastatic disease (with findings concerning for extracapsular extension) or a tumor deposit. 9 mm short-axis low attenuation left level III lymph node likely reflecting a site of nodal metastatic disease. Scans also noted scattered tiny pulmonary nodules are unchanged and likely benign but with only 5 months of documented stability but with recommend attention on follow-up given his history of prostate cancer.    Subsequently, the patient was referred to Dr. Graig on 04/24/24 who performed a FNA biopsy of left neck. However, biopsy was inconclusive. As a result, he then underwent a laryngoscopy with biopsy on 05/15/24 under the care of Dr. Lauralee. 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 Tumor is moderately differentiated with foci of poor differentiation and invasion into the striated muscle. Immunohistochemical studies are p16, p40, and HRHPV are positive.    He had a cervical spine MRI performed on 05/09/24 revealing no evidence of metastatic disease to the cervical spine itself but did indicated degenerative spondylosis at C4-5 and C5-6. 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 PET scan performed on 04/26/24 showing FDG avid mass along the left tongue base/oropharynx with left cervical chain nodal metastatic disease with no suspicious pulmonary or mediastinal  FDG uptake.   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    Prior cancers, if any: history of prostate cancer.   Given extracapsular extension with lymph node positive disease, plan made to proceed with concurrent chemoradiation.   Plan made for weekly cisplatin with radiation, if renal function allows.  Otherwise we will use either docetaxel or carboplatin/paclitaxel regimen.  Started concurrent chemoradiation with weekly cisplatin from 06/27/2024.  Creatinine went up to 1.44 on 07/12/2024.  Dose reduced cisplatin by 50% with cycle 3 as a result.  With normalization of creatinine, cisplatin was resumed at standard dose from cycle 4 onwards.  Oncology History  Malignant neoplasm of prostate (HCC)  01/19/2023 Cancer Staging   Staging form: Prostate, AJCC 8th Edition - Clinical stage from 01/19/2023: Stage IIC (cT2a, cN0, cM0, PSA: 6, Grade Group: 4) - Signed by Sherwood Rise, PA-C on 03/30/2023 Histopathologic type: Adenocarcinoma, NOS Stage prefix: Initial diagnosis Prostate specific antigen (PSA) range: Less than 10 Gleason primary pattern: 4 Gleason secondary pattern: 4 Gleason score: 8 Histologic grading system: 5 grade system Number of biopsy cores examined: 14 Number of biopsy cores positive: 5 Location of positive needle core biopsies: One side   02/24/2023 Initial Diagnosis   Malignant neoplasm of prostate (HCC)   Primary squamous cell carcinoma of oropharynx (HCC)  06/07/2024 Initial Diagnosis   Malignant neoplasm of base of tongue (HCC)  06/07/2024 Cancer Staging   Staging form: Pharynx - HPV-Mediated Oropharynx, AJCC 8th Edition - Clinical stage from 06/07/2024: Stage I (cT2, cN1, cM0, p16+) - Signed by Izell Domino, MD on 06/07/2024 Stage prefix: Initial diagnosis   06/28/2024 -  Chemotherapy   Patient is on Treatment Plan : HEAD/NECK Cisplatin (40) q7d         REVIEW OF SYSTEMS:    Review of Systems - Oncology  All other pertinent systems were reviewed with the patient and are negative.  ALLERGIES: He has no known allergies.  MEDICATIONS:  Current Outpatient Medications  Medication Sig Dispense Refill   albuterol  (VENTOLIN  HFA) 108 (90 Base) MCG/ACT inhaler Inhale 1-2 puffs into the lungs every 6 (six) hours as needed for wheezing or shortness of breath.     allopurinol  (ZYLOPRIM ) 300 MG tablet Take 300 mg by mouth daily.     ALPRAZolam  (XANAX ) 1 MG tablet Take 1 mg by mouth at bedtime as needed for anxiety or sleep.     aspirin  EC 81 MG tablet Take 81 mg by mouth daily. Swallow whole.     celecoxib (CELEBREX) 200 MG capsule  (Patient taking differently: Not taking at this time)     cholecalciferol (VITAMIN D3) 25 MCG (1000 UNIT) tablet Take 1,000 Units by mouth daily.     dexamethasone  (DECADRON ) 4 MG tablet Take 2 tablets (8 mg) by mouth daily x 3 days starting the day after cisplatin chemotherapy. Take with food. 30 tablet 1   ezetimibe  (ZETIA ) 10 MG tablet TAKE 1 TABLET BY MOUTH DAILY 90 tablet 3   finasteride  (PROSCAR ) 5 MG tablet Take 1.25 mg by mouth daily.     HYDROcodone -acetaminophen  (NORCO/VICODIN) 5-325 MG tablet Take 1 tablet by mouth every 6 (six) hours as needed for severe pain.     ibuprofen (ADVIL) 200 MG tablet Take 600 mg by mouth daily as needed for moderate pain (pain score 4-6).     lidocaine  (XYLOCAINE ) 2 % solution Patient: Mix 1part 2% viscous lidocaine , 1part H20. Swish & swallow 10mL of diluted mixture, 30min before meals and at bedtime, up to QID 200 mL 3   lidocaine -prilocaine  (EMLA ) cream Apply to affected area once 30 g 3   losartan  (COZAAR ) 50 MG tablet Take 1 tablet (50 mg total) by mouth daily. 90 tablet 3   magic mouthwash w/lidocaine  SOLN Take 5 mLs by mouth 4 (four) times daily as needed for mouth pain. Suspension contains equal amounts of Maalox Extra Strength, nystatin, diphenhydramine  and lidocaine . Swish and Swallow 140 mL 2    metaxalone (SKELAXIN) 800 MG tablet Take 400 mg by mouth 3 (three) times daily as needed for muscle spasms.     nitroGLYCERIN  (NITROSTAT ) 0.4 MG SL tablet Place 1 tablet (0.4 mg total) under the tongue every 5 (five) minutes as needed for chest pain. 90 tablet 3   ondansetron  (ZOFRAN ) 8 MG tablet Take 1 tablet (8 mg total) by mouth every 8 (eight) hours as needed for nausea or vomiting. Start on the third day after cisplatin. 30 tablet 1   oxyCODONE  10 MG TABS Take 1 tablet (10 mg total) by mouth every 6 (six) hours as needed. 120 tablet 0   pantoprazole  (PROTONIX ) 40 MG tablet Take 1 tablet (40 mg total) by mouth 2 (two) times daily. 180 tablet 3   polyethylene glycol (MIRALAX / GLYCOLAX) 17 g packet Take 17 g by mouth daily as needed for moderate constipation.     prochlorperazine  (COMPAZINE ) 10 MG tablet Take  1 tablet (10 mg total) by mouth every 6 (six) hours as needed (Nausea or vomiting). 30 tablet 1   rosuvastatin  (CRESTOR ) 40 MG tablet Take 1 tablet (40 mg total) by mouth daily. 90 tablet 3   Tetrahydrozoline HCl (REDNESS RELIEVER EYE DROPS OP) Place 1 drop into both eyes daily as needed (redness).     TRELEGY ELLIPTA 100-62.5-25 MCG/ACT AEPB Inhale 1 puff into the lungs daily.     No current facility-administered medications for this visit.     VITALS:   Blood pressure (!) 102/58, pulse 98, temperature 97.7 F (36.5 C), resp. rate 17, weight 173 lb 11.2 oz (78.8 kg), SpO2 99%.  Wt Readings from Last 3 Encounters:  07/19/24 173 lb 11.2 oz (78.8 kg)  07/12/24 176 lb 6.4 oz (80 kg)  07/05/24 179 lb 8 oz (81.4 kg)    Body mass index is 25.65 kg/m.   Onc Performance Status - 07/19/24 0955       ECOG Perf Status   ECOG Perf Status Restricted in physically strenuous activity but ambulatory and able to carry out work of a light or sedentary nature, e.g., light house work, office work      KPS SCALE   KPS % SCORE Able to carry on normal activity, minor s/s of disease             PHYSICAL EXAM:   Physical Exam Constitutional:      General: He is not in acute distress.    Appearance: Normal appearance.  HENT:     Head: Normocephalic and atraumatic.     Mouth/Throat:     Comments: Signs of mild mucositis with erythema in the back of the throat.  No definite thrush. Eyes:     Conjunctiva/sclera: Conjunctivae normal.  Neck:     Comments:  2 cm, relatively firm mass at the angle of left jaw Cardiovascular:     Rate and Rhythm: Normal rate and regular rhythm.  Pulmonary:     Effort: Pulmonary effort is normal. No respiratory distress.  Chest:     Comments: Right-sided Port-A-Cath in place without any signs of infection Abdominal:     General: There is no distension.     Comments: Feeding tube in place  Neurological:     General: No focal deficit present.     Mental Status: He is alert and oriented to person, place, and time.  Psychiatric:        Mood and Affect: Mood normal.        Behavior: Behavior normal.       LABORATORY DATA:   I have reviewed the data as listed.  Results for orders placed or performed in visit on 07/19/24  Rad Onc Aria Session Summary  Result Value Ref Range   Course ID [id number]    Course Start Date 06/19/2024    Session Number 17    Course First Treatment Date 06/27/2024  2:31 PM    Course Last Treatment Date 07/19/2024 10:14 AM    Course Elapsed Days 22    Reference Point ID HN_Oroph dp    Reference Point Dosage Given to Date 34 Gy   Reference Point Session Dosage Given 2 Gy   Plan ID HN_Oroph    Plan Name HN_Oroph    Plan Fractions Treated to Date 17    Plan Total Fractions Prescribed 35    Plan Prescribed Dose Per Fraction 2 Gy   Plan Total Prescribed Dose 70.000000 Gy   Plan Primary Reference  Point HN_Oroph dp   Results for orders placed or performed in visit on 07/19/24  Magnesium   Result Value Ref Range   Magnesium  1.7 1.7 - 2.4 mg/dL  Basic Metabolic Panel - Cancer Center Only  Result Value Ref Range    Sodium 136 135 - 145 mmol/L   Potassium 3.2 (L) 3.5 - 5.1 mmol/L   Chloride 104 98 - 111 mmol/L   CO2 26 22 - 32 mmol/L   Glucose, Bld 165 (H) 70 - 99 mg/dL   BUN 17 8 - 23 mg/dL   Creatinine 8.79 9.38 - 1.24 mg/dL   Calcium  9.1 8.9 - 10.3 mg/dL   GFR, Estimated >39 >39 mL/min   Anion gap 6 5 - 15  CBC with Differential (Cancer Center Only)  Result Value Ref Range   WBC Count 3.6 (L) 4.0 - 10.5 K/uL   RBC 4.16 (L) 4.22 - 5.81 MIL/uL   Hemoglobin 13.2 13.0 - 17.0 g/dL   HCT 63.1 (L) 60.9 - 47.9 %   MCV 88.5 80.0 - 100.0 fL   MCH 31.7 26.0 - 34.0 pg   MCHC 35.9 30.0 - 36.0 g/dL   RDW 87.4 88.4 - 84.4 %   Platelet Count 112 (L) 150 - 400 K/uL   nRBC 0.0 0.0 - 0.2 %   Neutrophils Relative % 86 %   Neutro Abs 3.1 1.7 - 7.7 K/uL   Lymphocytes Relative 11 %   Lymphs Abs 0.4 (L) 0.7 - 4.0 K/uL   Monocytes Relative 3 %   Monocytes Absolute 0.1 0.1 - 1.0 K/uL   Eosinophils Relative 0 %   Eosinophils Absolute 0.0 0.0 - 0.5 K/uL   Basophils Relative 0 %   Basophils Absolute 0.0 0.0 - 0.1 K/uL   Immature Granulocytes 0 %   Abs Immature Granulocytes 0.01 0.00 - 0.07 K/uL      RADIOGRAPHIC STUDIES:  I have personally reviewed the radiological images as listed and agree with the findings in the report.  IR GASTROSTOMY TUBE MOD SED Result Date: 06/23/2024 INDICATION: he will receive chemo/radiation for head and neck cancer EXAM: Procedures: 1. FLUOROSCOPIC NASOGASTRIC TUBE PLACEMENT 2. PERCUTANEOUS GASTROSTOMY FEEDING TUBE PLACEMENT COMPARISON:  PET-CT, 04/26/2024 and 02/22/2023. CT chest, 04/06/2024 MEDICATIONS: Ancef  2 gm IV; Antibiotics were administered within 1 hour of the procedure. Glucagon 1 mg IV CONTRAST:  15 mL of Isovue  300 administered into the gastric lumen. ANESTHESIA/SEDATION: Moderate (conscious) sedation was employed during this procedure. A total of Versed  1 mg and Fentanyl  50 mcg was administered intravenously. Moderate Sedation Time: 15 minutes. The patient's level  of consciousness and vital signs were monitored continuously by radiology nursing throughout the procedure under my direct supervision. FLUOROSCOPY: Radiation Exposure Index and estimated peak skin dose (PSD); Reference air kerma (RAK), 7 mGy. COMPLICATIONS: None immediate. PROCEDURE: Informed written consent was obtained from the patient and/or patient's representative following explanation of the procedure, risks, benefits and alternatives. A time out was performed prior to the initiation of the procedure. Maximal barrier sterile technique utilized including caps, mask, sterile gowns, sterile gloves, large sterile drape, hand hygiene and sterile prep. The LEFT upper quadrant was sterilely prepped and draped. A oral gastric catheter was inserted into the stomach under fluoroscopy. The LEFT costal margin and barium opacified transverse colon were identified and avoided. Air was injected into the stomach for insufflation and visualization under fluoroscopy. Under sterile conditions and local anesthesia, 2 T tacks were utilized to pexy the anterior aspect of the stomach against the  ventral abdominal wall. Contrast injection confirmed appropriate positioning of each of the T tacks. An incision was made between the T tacks and a 17 gauge trocar needle was utilized to access the stomach. Needle position was confirmed within the stomach with aspiration of air and injection of a small amount of contrast. A stiff guidewire was advanced into the gastric lumen and under intermittent fluoroscopic guidance, the access needle was exchanged for a telescoping peel-away sheath, ultimately allowing placement of a 14 Fr balloon retention gastrostomy tube. The retention balloon was insufflated with a mixture of dilute saline and contrast and pulled taut against the anterior wall of the stomach. The external disc was cinched. Contrast injection confirms positioning within the stomach. Several spot radiographic images were obtained in  various obliquities for documentation. The patient tolerated procedure well without immediate post procedural complication. FINDINGS: After successful fluoroscopic guided placement, the gastrostomy tube is appropriately positioned with internal retention balloon against the ventral aspect of the gastric lumen. IMPRESSION: Successful fluoroscopic insertion of a 14 Fr balloon retention gastrostomy tube. The gastrostomy may be used immediately for medication administration and in 4 hrs for the initiation of feeds. RECOMMENDATIONS: The patient will return to [facility] ([facility]) for routine feeding tube evaluation and exchange in 6 months. Thom Hall, MD [facility] Electronically Signed   By: Thom Hall M.D.   On: 06/23/2024 14:41   IR IMAGING GUIDED PORT INSERTION Result Date: 06/23/2024 INDICATION: he will receive chemo/radiation for head and neck cancer EXAM: IMPLANTED PORT A CATH PLACEMENT WITH ULTRASOUND AND FLUOROSCOPIC GUIDANCE MEDICATIONS: None ANESTHESIA/SEDATION: Moderate (conscious) sedation was employed during this procedure. A total of Versed  3 mg and Fentanyl  150 mcg was administered intravenously. Moderate Sedation Time: 20 minutes. The patient's level of consciousness and vital signs were monitored continuously by radiology nursing throughout the procedure under my direct supervision. FLUOROSCOPY: Radiation Exposure Index and estimated peak skin dose (PSD); Reference air kerma (RAK), 0 mGy. COMPLICATIONS: None immediate. PROCEDURE: The procedure, risks, benefits, and alternatives were explained to the patient. Questions regarding the procedure were encouraged and answered. The patient understands and consents to the procedure. The RIGHT neck and chest were prepped with chlorhexidine  in a sterile fashion, and a sterile drape was applied covering the operative field. Maximum barrier sterile technique with sterile gowns and  gloves were used for the procedure. A timeout was performed prior to the initiation of the procedure. Local anesthesia was provided with 1% lidocaine  with epinephrine . After creating a small venotomy incision, a micropuncture kit was utilized to access the internal jugular vein under direct, real-time ultrasound guidance. Ultrasound image documentation was performed. The microwire was kinked to measure appropriate catheter length. A subcutaneous port pocket was then created along the upper chest wall utilizing a combination of sharp and blunt dissection. The pocket was irrigated with sterile saline. A single lumen power injectable port was chosen for placement. The 8 Fr catheter was tunneled from the port pocket site to the venotomy incision. The port was placed in the pocket. The external catheter was trimmed to appropriate length. At the venotomy, an 8 Fr peel-away sheath was placed over a guidewire under fluoroscopic guidance. The catheter was then placed through the sheath and the sheath was removed. Final catheter positioning was confirmed and documented with a fluoroscopic spot radiograph. The port was accessed with a Huber needle, aspirated and flushed with heparinized saline. The port pocket incision was closed with interrupted 3-0 Vicryl suture then Dermabond  was applied, including at the venotomy incision. Dressings were placed. The patient tolerated the procedure well without immediate post procedural complication. IMPRESSION: Successful placement of a RIGHT internal jugular approach power injectable Port-A-Cath. The tip of the catheter is positioned within the proximal RIGHT atrium. The catheter is ready for immediate use. Thom Hall, MD [facility] Electronically Signed   By: Thom Hall M.D.   On: 06/23/2024 13:41     CODE STATUS:  Code Status History     Date Active Date Inactive Code Status Order ID Comments User Context   06/23/2024  1257 06/24/2024 0510 Full Code [id number]  Hall Thom, MD H[facility]   06/23/2024 1257 06/23/2024 1257 Full Code [id number]  Hall Thom, MD HOV   04/29/2023 1830 04/30/2023 1811 Full Code [id number]  Renda Glance, MD Inpatient   06/05/2022 0846 06/05/2022 1634 Full Code [id number]  Claudene Victory ORN, MD Inpatient    Questions for Most Recent Historical Code Status (Order [id number])     Question Answer   By: Consent: discussion documented in EHR            No orders of the defined types were placed in this encounter.    Future Appointments  Date Time Provider Department Center  07/20/2024  7:30 AM [facility] INFUSION [facility] None  07/20/2024  2:30 PM Daryle Heron CROME, RD [facility] None  07/20/2024  3:15 PM [facility] [id number] [facility] None  07/21/2024  8:00 AM [facility] INFUSION [facility] None  07/21/2024  9:45 AM [facility] LINAC 3 [facility] None  07/24/2024 10:00 AM [facility] LINAC 3 [facility] None  07/24/2024 10:20 AM LINAC-SQUIRE [facility] None  07/24/2024  3:30 PM [facility] INFUSION [facility] None  07/25/2024 10:00 AM [facility] LINAC 3 [facility] None  07/26/2024  7:30 AM [facility] LINAC 3 [facility] None  07/26/2024  8:00 AM [facility] FLUSH [facility] None  07/26/2024  9:00 AM Maanvi Lecompte, MD [facility] None  07/26/2024  9:30 AM [facility] INFUSION [facility] None  07/26/2024  9:30 AM Ivonne Harlene RAMAN, RD [facility] None  07/27/2024 10:00 AM [facility] LINAC 3 [facility] None  07/28/2024 10:00 AM [facility] LINAC 3 [facility] None  07/28/2024  1:45 PM [facility] INFUSION [facility] None  07/31/2024 10:00 AM [facility] LINAC 3 [facility] None  08/01/2024 10:00 AM [facility] LINAC 3 [facility] None  08/02/2024  8:15 AM [facility] [id number] [facility] None  08/02/2024  8:45 AM [facility] FLUSH [facility] None  08/02/2024  9:15 AM Kailana Benninger, MD [facility] None  08/02/2024 10:00 AM [facility] INFUSION [facility] None  08/02/2024 11:15  AM Ivonne Harlene RAMAN, RD [facility] None  08/03/2024 10:00 AM [facility] LINAC 3 [facility] None  08/04/2024 10:00 AM [facility] LINAC 3 [facility] None  08/07/2024 10:00 AM [facility] LINAC 3 [facility] None  08/08/2024 10:00 AM [facility] LINAC 3 [facility] None  08/09/2024 10:00 AM [facility] LINAC 3 [facility] None  08/10/2024 10:00 AM [facility] LINAC 3 [facility] None  08/11/2024 10:00 AM [facility] LINAC 3 [facility] None  08/14/2024 10:00 AM [facility] LINAC 3 [facility] None  09/06/2024 10:00 AM Breedlove Blue, Blaire L, PT [facility] None  12/22/2024  3:30 PM [facility] 1 [facility] [place]      This document was completed utilizing speech recognition software. Grammatical errors, random word insertions, pronoun errors, and incomplete sentences are an occasional consequence of this system due to software limitations, ambient noise, and hardware issues. Any formal questions or concerns about the content, text or information contained within the body of this dictation should be directly addressed to the provider  for clarification.

## 2024-07-19 NOTE — Assessment & Plan Note (Signed)
 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   CT scan from July 2025 showed lymph nodes with extranodal extension, indicating a higher risk of recurrence without appropriate treatment. The cancer is aggressive due to lymph node involvement but has not spread beyond the head and neck area.   cT2,cN1,cM0,p16+, Stage 1 disease.  Concern for extranodal extension based on imaging.  Previously I discussed staging, prognosis, plan of care, treatment options.  Reviewed NCCN guidelines.  We have discussed about role of cisplatin being a radiosensitizer in the treatment of head and neck cancer.  We have discussed about the curative intent of chemoradiation for this patient.  Patient is willing to proceed with weekly cisplatin.  Emphasized the importance of hydration to protect kidney function. The treatment plan includes seven weeks of radiation and weekly chemotherapy, with the possibility of holding chemotherapy towards the end if side effects become problematic. The role of chemotherapy is supportive, making radiation more effective.   Last week creatinine was increased at 1.44.  Hence we dose reduce cisplatin by 50% with cycle 3.  Labs today showed creatinine of 1.2, improved with normal eGFR.  Hence we will proceed with standard dose of cisplatin at 40 mg/m with cycle 4 tomorrow.  White count 3600 but ANC is 3100.  Platelet 112,000.  Overall no dose-limiting toxicities.  We will arrange for IV fluids on Monday, Wednesday, Friday each week, going forward to maintain his creatinine at a safe level.   He is to continue radiation treatments as scheduled.    We again emphasized the importance of hydration with him.  He has been making a good effort and maintaining hydration.  - Monitor kidney function and electrolytes weekly during treatment - Previously referred to physical therapy, speech language  pathology, and nutritionist for supportive care  RTC in 1 week for labs, follow-up and continuation of chemotherapy.

## 2024-07-20 ENCOUNTER — Other Ambulatory Visit: Payer: Self-pay

## 2024-07-20 ENCOUNTER — Encounter: Payer: Self-pay | Admitting: Oncology

## 2024-07-20 ENCOUNTER — Inpatient Hospital Stay

## 2024-07-20 ENCOUNTER — Inpatient Hospital Stay: Admitting: Nutrition

## 2024-07-20 ENCOUNTER — Ambulatory Visit
Admission: RE | Admit: 2024-07-20 | Discharge: 2024-07-20 | Disposition: A | Discharge status: Home / Self Care | Source: Ambulatory Visit | Attending: Radiation Oncology | Admitting: Radiation Oncology

## 2024-07-20 VITALS — BP 98/50 | HR 100 | Temp 98.0°F | Resp 16

## 2024-07-20 DIAGNOSIS — Z510 Encounter for antineoplastic radiation therapy: Secondary | ICD-10-CM | POA: Diagnosis not present

## 2024-07-20 DIAGNOSIS — Z5111 Encounter for antineoplastic chemotherapy: Secondary | ICD-10-CM | POA: Diagnosis not present

## 2024-07-20 DIAGNOSIS — C109 Malignant neoplasm of oropharynx, unspecified: Secondary | ICD-10-CM

## 2024-07-20 LAB — RAD ONC ARIA SESSION SUMMARY
Course Elapsed Days: 23
Plan Fractions Treated to Date: 18
Plan Prescribed Dose Per Fraction: 2 Gy
Plan Total Fractions Prescribed: 35
Plan Total Prescribed Dose: 70 Gy
Reference Point Dosage Given to Date: 36 Gy
Reference Point Session Dosage Given: 2 Gy
Session Number: 18

## 2024-07-20 MED ORDER — POTASSIUM CHLORIDE CRYS ER 20 MEQ PO TBCR
20.0000 meq | EXTENDED_RELEASE_TABLET | Freq: Once | ORAL | Status: AC
  Administered 2024-07-20: 20 meq via ORAL
  Filled 2024-07-20: qty 1

## 2024-07-20 MED ORDER — APREPITANT 130 MG/18ML IV EMUL
130.0000 mg | Freq: Once | INTRAVENOUS | Status: AC
  Administered 2024-07-20: 130 mg via INTRAVENOUS
  Filled 2024-07-20: qty 18

## 2024-07-20 MED ORDER — SODIUM CHLORIDE 0.9 % IV SOLN
40.0000 mg/m2 | Freq: Once | INTRAVENOUS | Status: AC
  Administered 2024-07-20: 81 mg via INTRAVENOUS
  Filled 2024-07-20: qty 81

## 2024-07-20 MED ORDER — MAGNESIUM SULFATE 2 GM/50ML IV SOLN
2.0000 g | Freq: Once | INTRAVENOUS | Status: AC
  Administered 2024-07-20: 2 g via INTRAVENOUS
  Filled 2024-07-20: qty 50

## 2024-07-20 MED ORDER — PALONOSETRON HCL INJECTION 0.25 MG/5ML
0.2500 mg | Freq: Once | INTRAVENOUS | Status: AC
  Administered 2024-07-20: 0.25 mg via INTRAVENOUS
  Filled 2024-07-20: qty 5

## 2024-07-20 MED ORDER — POTASSIUM CHLORIDE IN NACL 20-0.9 MEQ/L-% IV SOLN
Freq: Once | INTRAVENOUS | Status: AC
  Filled 2024-07-20: qty 1000

## 2024-07-20 MED ORDER — SODIUM CHLORIDE 0.9 % IV SOLN: INTRAVENOUS | Status: DC

## 2024-07-20 MED ORDER — DEXAMETHASONE SOD PHOSPHATE PF 10 MG/ML IJ SOLN
10.0000 mg | Freq: Once | INTRAMUSCULAR | Status: AC
  Administered 2024-07-20: 10 mg via INTRAVENOUS

## 2024-07-20 NOTE — Patient Instructions (Signed)
 [facility] - A DEPT OF [facility]  Discharge Instructions: Thank you for choosing [facility] to provide your oncology and hematology care.   If you have a lab appointment with the Cancer Center, please go directly to the Cancer Center and check in at the registration area.   Wear comfortable clothing and clothing appropriate for easy access to any Portacath or PICC line.   We strive to give you quality time with your provider. You may need to reschedule your appointment if you arrive late (15 or more minutes).  Arriving late affects you and other patients whose appointments are after yours.  Also, if you miss three or more appointments without notifying the office, you may be dismissed from the clinic at the provider's discretion.      For prescription refill requests, have your pharmacy contact our office and allow 72 hours for refills to be completed.    Today you received the following chemotherapy and/or immunotherapy agents: CISplatin (PLATINOL)     To help prevent nausea and vomiting after your treatment, we encourage you to take your nausea medication as directed.  BELOW ARE SYMPTOMS THAT SHOULD BE REPORTED IMMEDIATELY: *FEVER GREATER THAN 100.4 F (38 C) OR HIGHER *CHILLS OR SWEATING *NAUSEA AND VOMITING THAT IS NOT CONTROLLED WITH YOUR NAUSEA MEDICATION *UNUSUAL SHORTNESS OF BREATH *UNUSUAL BRUISING OR BLEEDING *URINARY PROBLEMS (pain or burning when urinating, or frequent urination) *BOWEL PROBLEMS (unusual diarrhea, constipation, pain near the anus) TENDERNESS IN MOUTH AND THROAT WITH OR WITHOUT PRESENCE OF ULCERS (sore throat, sores in mouth, or a toothache) UNUSUAL RASH, SWELLING OR PAIN  UNUSUAL VAGINAL DISCHARGE OR ITCHING   Items with * indicate a potential emergency and should be followed up as soon as possible or go to the Emergency Department if any problems should occur.  Please show the CHEMOTHERAPY ALERT CARD or  IMMUNOTHERAPY ALERT CARD at check-in to the Emergency Department and triage nurse.  Should you have questions after your visit or need to cancel or reschedule your appointment, please contact [facility] - A DEPT OF [facility]  Dept: [phone]  and follow the prompts.  Office hours are 8:00 a.m. to 4:30 p.m. Monday - Friday. Please note that voicemails left after 4:00 p.m. may not be returned until the following business day.  We are closed weekends and major holidays. You have access to a nurse at all times for urgent questions. Please call the main number to the clinic Dept: [phone] and follow the prompts.   For any non-urgent questions, you may also contact your provider using MyChart. We now offer e-Visits for anyone 41 and older to request care online for non-urgent symptoms. For details visit [email or web address].   Also download the MyChart app! Go to the app store, search "MyChart", open the app, select [place], and log in with your MyChart username and password.

## 2024-07-20 NOTE — Progress Notes (Signed)
 Per Pasam, MD, okay to run IVF 1L over 1hr. Supportive Therapy plan updated to reflect change. Appointments updated.

## 2024-07-20 NOTE — Progress Notes (Signed)
 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  He is receiving concurrent chemoradiation therapy with weekly cisplatin.  He is being followed by Dr. Izell and Dr. Autumn.  Final radiation therapy is scheduled for Monday, November 24.  Status post PEG on October 3.  Weight documented as 173 pounds 11.2 ounces on October 29.  This is decreased from 183 pounds on October 8.  This is 5% loss over 3 weeks.  Labs include potassium 3.2 and glucose 165.  Estimated nutrition needs: 2160-2330 cal, 99-116 g protein, greater than 2.1 L fluid.  Reports drinking 2 boost very high-calorie and 2 Ensure complete every day.  Also drinks a milkshake, a smoothie, and eats pudding and yogurt.  He is no longer trying to eat solid foods.  He continues to have some dry mouth.  He is exercising in some form every day and drinking 100 ounces of water  every day.  He has been scheduled for IV fluids 3 days weekly.  He is willing to increase oral nutrition supplements to avoid using his feeding tube for nutrition.  He still flushes his tube with water  and cares for it daily.  Denies problems with his feeding tube.  Denies constipation.  Nutrition diagnosis: Predicted sub-optimal energy intake has evolved into inadequate oral intake related to cancer and associated treatments as evidenced by 5% weight loss over 3 weeks.  Intervention: Increase boost VHC 3 times daily.  Each carton provides 530 cal and 22 g protein/total of 1590 cal and 66 g protein. Continue drinking 2 Ensure complete daily.  Each carton provides 350 cal and 30 g protein/700 cal and 60 g protein total. Continue increased water  intake. IV fluids 3 times a week per MD. Add cool-mist humidifier to bedroom to improve dry mouth. Continue activity as tolerated.  Monitoring, evaluation, goals: Tolerate adequate calories and protein to minimize further weight loss.  Next visit: Wednesday, November 5, with  Elvie.  **Disclaimer: This note was dictated with voice recognition software. Similar sounding words can inadvertently be transcribed and this note may contain transcription errors which may not have been corrected upon publication of note.**

## 2024-07-21 ENCOUNTER — Inpatient Hospital Stay

## 2024-07-21 ENCOUNTER — Other Ambulatory Visit: Payer: Self-pay

## 2024-07-21 ENCOUNTER — Ambulatory Visit
Admission: RE | Admit: 2024-07-21 | Discharge: 2024-07-21 | Disposition: A | Discharge status: Home / Self Care | Source: Ambulatory Visit | Attending: Radiation Oncology | Admitting: Radiation Oncology

## 2024-07-21 VITALS — BP 111/62 | HR 82 | Temp 97.4°F | Resp 16 | Ht 69.0 in | Wt 179.1 lb

## 2024-07-21 DIAGNOSIS — Z510 Encounter for antineoplastic radiation therapy: Secondary | ICD-10-CM | POA: Diagnosis not present

## 2024-07-21 DIAGNOSIS — C109 Malignant neoplasm of oropharynx, unspecified: Secondary | ICD-10-CM

## 2024-07-21 DIAGNOSIS — Z5111 Encounter for antineoplastic chemotherapy: Secondary | ICD-10-CM | POA: Diagnosis not present

## 2024-07-21 LAB — RAD ONC ARIA SESSION SUMMARY
Course Elapsed Days: 24
Plan Fractions Treated to Date: 19
Plan Prescribed Dose Per Fraction: 2 Gy
Plan Total Fractions Prescribed: 35
Plan Total Prescribed Dose: 70 Gy
Reference Point Dosage Given to Date: 38 Gy
Reference Point Session Dosage Given: 2 Gy
Session Number: 19

## 2024-07-21 MED ORDER — SODIUM CHLORIDE 0.9 % IV SOLN: Freq: Once | INTRAVENOUS | Status: AC

## 2024-07-21 NOTE — Patient Instructions (Signed)

## 2024-07-24 ENCOUNTER — Other Ambulatory Visit: Payer: Self-pay

## 2024-07-24 ENCOUNTER — Ambulatory Visit
Admission: RE | Admit: 2024-07-24 | Discharge: 2024-07-24 | Disposition: A | Discharge status: Home / Self Care | Source: Ambulatory Visit | Attending: Radiation Oncology | Admitting: Radiation Oncology

## 2024-07-24 ENCOUNTER — Ambulatory Visit
Admission: RE | Admit: 2024-07-24 | Discharge: 2024-07-24 | Disposition: A | Source: Ambulatory Visit | Attending: Radiation Oncology

## 2024-07-24 ENCOUNTER — Inpatient Hospital Stay: Attending: Oncology

## 2024-07-24 VITALS — BP 90/65 | HR 91 | Temp 97.6°F | Resp 16

## 2024-07-24 DIAGNOSIS — C109 Malignant neoplasm of oropharynx, unspecified: Secondary | ICD-10-CM | POA: Insufficient documentation

## 2024-07-24 DIAGNOSIS — C779 Secondary and unspecified malignant neoplasm of lymph node, unspecified: Secondary | ICD-10-CM | POA: Insufficient documentation

## 2024-07-24 DIAGNOSIS — C773 Secondary and unspecified malignant neoplasm of axilla and upper limb lymph nodes: Secondary | ICD-10-CM | POA: Insufficient documentation

## 2024-07-24 DIAGNOSIS — Z5111 Encounter for antineoplastic chemotherapy: Secondary | ICD-10-CM | POA: Insufficient documentation

## 2024-07-24 DIAGNOSIS — I959 Hypotension, unspecified: Secondary | ICD-10-CM | POA: Insufficient documentation

## 2024-07-24 DIAGNOSIS — Z510 Encounter for antineoplastic radiation therapy: Secondary | ICD-10-CM | POA: Diagnosis not present

## 2024-07-24 DIAGNOSIS — C090 Malignant neoplasm of tonsillar fossa: Secondary | ICD-10-CM | POA: Diagnosis not present

## 2024-07-24 DIAGNOSIS — R112 Nausea with vomiting, unspecified: Secondary | ICD-10-CM | POA: Insufficient documentation

## 2024-07-24 DIAGNOSIS — D701 Agranulocytosis secondary to cancer chemotherapy: Secondary | ICD-10-CM | POA: Insufficient documentation

## 2024-07-24 DIAGNOSIS — T451X5A Adverse effect of antineoplastic and immunosuppressive drugs, initial encounter: Secondary | ICD-10-CM | POA: Insufficient documentation

## 2024-07-24 LAB — RAD ONC ARIA SESSION SUMMARY
Course Elapsed Days: 27
Plan Fractions Treated to Date: 20
Plan Prescribed Dose Per Fraction: 2 Gy
Plan Total Fractions Prescribed: 35
Plan Total Prescribed Dose: 70 Gy
Reference Point Dosage Given to Date: 40 Gy
Reference Point Session Dosage Given: 2 Gy
Session Number: 20

## 2024-07-24 MED ORDER — SODIUM CHLORIDE 0.9 % IV SOLN: Freq: Once | INTRAVENOUS | Status: AC

## 2024-07-24 NOTE — Patient Instructions (Signed)

## 2024-07-25 ENCOUNTER — Other Ambulatory Visit (HOSPITAL_COMMUNITY): Payer: Self-pay

## 2024-07-25 ENCOUNTER — Other Ambulatory Visit: Payer: Self-pay

## 2024-07-25 ENCOUNTER — Ambulatory Visit
Admission: RE | Admit: 2024-07-25 | Discharge: 2024-07-25 | Disposition: A | Discharge status: Home / Self Care | Source: Ambulatory Visit | Attending: Radiation Oncology | Admitting: Radiation Oncology

## 2024-07-25 LAB — RAD ONC ARIA SESSION SUMMARY
Course Elapsed Days: 28
Plan Fractions Treated to Date: 21
Plan Prescribed Dose Per Fraction: 2 Gy
Plan Total Fractions Prescribed: 35
Plan Total Prescribed Dose: 70 Gy
Reference Point Dosage Given to Date: 42 Gy
Reference Point Session Dosage Given: 2 Gy
Session Number: 21

## 2024-07-26 ENCOUNTER — Other Ambulatory Visit: Payer: Self-pay

## 2024-07-26 ENCOUNTER — Inpatient Hospital Stay: Attending: Oncology | Admitting: Dietician

## 2024-07-26 ENCOUNTER — Inpatient Hospital Stay: Admitting: Oncology

## 2024-07-26 ENCOUNTER — Other Ambulatory Visit (HOSPITAL_COMMUNITY): Payer: Self-pay

## 2024-07-26 ENCOUNTER — Ambulatory Visit
Admission: RE | Admit: 2024-07-26 | Discharge: 2024-07-26 | Disposition: A | Source: Ambulatory Visit | Attending: Radiation Oncology

## 2024-07-26 ENCOUNTER — Encounter: Payer: Self-pay | Admitting: Oncology

## 2024-07-26 ENCOUNTER — Inpatient Hospital Stay

## 2024-07-26 VITALS — BP 90/48 | HR 97 | Temp 97.3°F | Resp 16 | Ht 69.0 in | Wt 172.3 lb

## 2024-07-26 DIAGNOSIS — T451X5A Adverse effect of antineoplastic and immunosuppressive drugs, initial encounter: Secondary | ICD-10-CM | POA: Diagnosis not present

## 2024-07-26 DIAGNOSIS — C109 Malignant neoplasm of oropharynx, unspecified: Secondary | ICD-10-CM

## 2024-07-26 DIAGNOSIS — D701 Agranulocytosis secondary to cancer chemotherapy: Secondary | ICD-10-CM | POA: Diagnosis not present

## 2024-07-26 DIAGNOSIS — G893 Neoplasm related pain (acute) (chronic): Secondary | ICD-10-CM

## 2024-07-26 LAB — CBC WITH DIFFERENTIAL (CANCER CENTER ONLY)
Abs Immature Granulocytes: 0.01 K/uL (ref 0.00–0.07)
Basophils Absolute: 0 K/uL (ref 0.0–0.1)
Basophils Relative: 1 %
Eosinophils Absolute: 0 K/uL (ref 0.0–0.5)
Eosinophils Relative: 0 %
HCT: 34.3 % — ABNORMAL LOW (ref 39.0–52.0)
Hemoglobin: 12.2 g/dL — ABNORMAL LOW (ref 13.0–17.0)
Immature Granulocytes: 1 %
Lymphocytes Relative: 14 %
Lymphs Abs: 0.3 K/uL — ABNORMAL LOW (ref 0.7–4.0)
MCH: 31.4 pg (ref 26.0–34.0)
MCHC: 35.6 g/dL (ref 30.0–36.0)
MCV: 88.4 fL (ref 80.0–100.0)
Monocytes Absolute: 0.2 K/uL (ref 0.1–1.0)
Monocytes Relative: 12 %
Neutro Abs: 1.4 K/uL — ABNORMAL LOW (ref 1.7–7.7)
Neutrophils Relative %: 72 %
Platelet Count: 120 K/uL — ABNORMAL LOW (ref 150–400)
RBC: 3.88 MIL/uL — ABNORMAL LOW (ref 4.22–5.81)
RDW: 12.6 % (ref 11.5–15.5)
Smear Review: NORMAL
WBC Count: 1.9 K/uL — ABNORMAL LOW (ref 4.0–10.5)
nRBC: 0 % (ref 0.0–0.2)

## 2024-07-26 LAB — RAD ONC ARIA SESSION SUMMARY
Course Elapsed Days: 29
Plan Fractions Treated to Date: 22
Plan Prescribed Dose Per Fraction: 2 Gy
Plan Total Fractions Prescribed: 35
Plan Total Prescribed Dose: 70 Gy
Reference Point Dosage Given to Date: 44 Gy
Reference Point Session Dosage Given: 2 Gy
Session Number: 22

## 2024-07-26 LAB — MAGNESIUM: Magnesium: 1.4 mg/dL — ABNORMAL LOW (ref 1.7–2.4)

## 2024-07-26 LAB — BASIC METABOLIC PANEL - CANCER CENTER ONLY
Anion gap: 8 (ref 5–15)
BUN: 26 mg/dL — ABNORMAL HIGH (ref 8–23)
CO2: 26 mmol/L (ref 22–32)
Calcium: 9.1 mg/dL (ref 8.9–10.3)
Chloride: 98 mmol/L (ref 98–111)
Creatinine: 1.15 mg/dL (ref 0.61–1.24)
GFR, Estimated: >60 mL/min (ref >60)
Glucose, Bld: 116 mg/dL — ABNORMAL HIGH (ref 70–99)
Potassium: 3.5 mmol/L (ref 3.5–5.1)
Sodium: 132 mmol/L — ABNORMAL LOW (ref 135–145)

## 2024-07-26 MED ORDER — MAGNESIUM SULFATE 2 GM/50ML IV SOLN
2.0000 g | Freq: Once | INTRAVENOUS | Status: AC
  Administered 2024-07-26: 2 g via INTRAVENOUS

## 2024-07-26 MED ORDER — SODIUM CHLORIDE 0.9 % IV SOLN
40.0000 mg/m2 | Freq: Once | INTRAVENOUS | Status: AC
  Administered 2024-07-26: 81 mg via INTRAVENOUS
  Filled 2024-07-26: qty 81

## 2024-07-26 MED ORDER — PALONOSETRON HCL INJECTION 0.25 MG/5ML
0.2500 mg | Freq: Once | INTRAVENOUS | Status: AC
  Administered 2024-07-26: 0.25 mg via INTRAVENOUS
  Filled 2024-07-26: qty 5

## 2024-07-26 MED ORDER — POTASSIUM CHLORIDE IN NACL 20-0.9 MEQ/L-% IV SOLN
Freq: Once | INTRAVENOUS | Status: AC
  Filled 2024-07-26: qty 1000

## 2024-07-26 MED ORDER — MAGNESIUM SULFATE 2 GM/50ML IV SOLN
2.0000 g | Freq: Once | INTRAVENOUS | Status: DC
  Filled 2024-07-26: qty 50

## 2024-07-26 MED ORDER — DEXAMETHASONE SOD PHOSPHATE PF 10 MG/ML IJ SOLN
10.0000 mg | Freq: Once | INTRAMUSCULAR | Status: AC
  Administered 2024-07-26: 10 mg via INTRAVENOUS

## 2024-07-26 MED ORDER — APREPITANT 130 MG/18ML IV EMUL
130.0000 mg | Freq: Once | INTRAVENOUS | Status: AC
  Administered 2024-07-26: 130 mg via INTRAVENOUS
  Filled 2024-07-26: qty 18

## 2024-07-26 MED ORDER — SODIUM CHLORIDE 0.9 % IV SOLN: INTRAVENOUS | Status: DC

## 2024-07-26 MED ORDER — MAGNESIUM SULFATE 2 GM/50ML IV SOLN
2.0000 g | Freq: Once | INTRAVENOUS | Status: AC
  Administered 2024-07-26: 2 g via INTRAVENOUS
  Filled 2024-07-26: qty 50

## 2024-07-26 MED ORDER — MORPHINE SULFATE ER 15 MG PO TBCR
15.0000 mg | EXTENDED_RELEASE_TABLET | Freq: Two times a day (BID) | ORAL | 0 refills | Status: DC
  Filled 2024-07-26: qty 60, 30d supply, fill #0

## 2024-07-26 NOTE — Assessment & Plan Note (Signed)
 White blood cell count decreased from 3600 to 1900, with a neutrophil count of 1400, a common chemotherapy side effect. Plan to administer Zarxio to support white count, allowing treatment continuation as long as neutrophil count remains above 1000. - Request approval for Zarxio for 2 days starting tomorrow - Administer booster injection on Thursday and Friday, timed with radiation therapy sessions.

## 2024-07-26 NOTE — Patient Instructions (Signed)

## 2024-07-26 NOTE — Progress Notes (Signed)
 [facility]  ONCOLOGY CLINIC PROGRESS NOTE   Patient Care Team: Windy Coy, MD as PCP - General (Family Medicine) Pietro, Redell RAMAN, MD as PCP - Cardiology (Cardiology) Vertell Pont, RN as Oncology Nurse Navigator Renda Glance, MD as Consulting Physician (Urology) Patrcia Cough, MD as Consulting Physician (Radiation Oncology) Starla Wendelyn BIRCH, RN as Registered Nurse Izell Domino, MD as Consulting Physician (Radiation Oncology) Autumn Millman, MD as Consulting Physician (Oncology) Lauralee Chew, MD as Referring Physician (Otolaryngology) Malmfelt, Delon CROME, RN as Oncology Nurse Navigator  PATIENT NAME: Gilbert Thompson   MR#: [id number] DOB: 08/17/1951  Date of visit: 07/26/2024   ASSESSMENT & PLAN:   Gilbert Thompson is a 73 y.o. 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  Concern for extranodal extension based on imaging.  Started concurrent chemoradiation with weekly cisplatin from 06/27/2024.  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   CT scan from July 2025 showed lymph nodes with extranodal extension, indicating a higher risk of recurrence without appropriate treatment. The cancer is aggressive due to lymph node involvement but has not spread beyond the head and neck area.   cT2,cN1,cM0,p16+, Stage 1 disease.  Concern for extranodal extension based on imaging.  Previously I discussed staging, prognosis, plan of care, treatment options.  Reviewed NCCN guidelines.  We have discussed about role of cisplatin being a radiosensitizer in the treatment of head and neck cancer.  We have discussed  about the curative intent of chemoradiation for this patient.  Patient is willing to proceed with weekly cisplatin.  Emphasized the importance of hydration to protect kidney function. The treatment plan includes seven weeks of radiation and weekly chemotherapy, with the possibility of holding chemotherapy towards the end if side effects become problematic. The role of chemotherapy is supportive, making radiation more effective.   Labs today showed creatinine of 1.15, improved with normal eGFR.  Leukopenia with white count of 1900 but ANC is 1400.  Platelet count 120,000. We will proceed with standard dose of cisplatin at 40 mg/m with cycle 5 today and arrange for G-CSF support with Zarxio 300 mcg subcutaneously daily for 2 days starting tomorrow, pending authorization.  We will arrange for IV fluids on Monday, Wednesday, Friday each week, going forward to maintain his creatinine at a safe level.   He is to continue radiation treatments as scheduled.    We again emphasized the importance of hydration with him.  He has been making a good effort and maintaining hydration.  - Monitor kidney function and electrolytes weekly during treatment - Previously referred to physical therapy, speech language pathology, and nutritionist for supportive care  RTC in 1 week for labs, follow-up and continuation of chemotherapy.  Cancer related pain He experiences significant pain, with oxycodone  10 mg every 4-6 hours proving ineffective. Discussed transitioning to long-acting analgesics, opting for morphine  due to its immediate effect and dosage flexibility, as he can still swallow pills. Fentanyl  patch was considered but deemed less suitable due to delayed onset and limited adjustability. - Prescribe morphine  15 mg twice daily, with the option to double the dose if needed. - Continue oxycodone  10 mg for breakthrough pain as needed.  Leukopenia due to antineoplastic chemotherapy  White blood cell count decreased  from 3600 to 1900, with a neutrophil count of 1400, a common chemotherapy side effect. Plan to administer Zarxio to support white count, allowing treatment continuation as long as neutrophil count remains above 1000. - Request approval for Zarxio for 2 days starting tomorrow - Administer booster injection on Thursday and Friday, timed with radiation therapy sessions.  Oral mucositis He has oral irritation and dryness, likely from radiation therapy, with observed redness but no infection. Current use of biotin and mouthwash is insufficient. - Encourage use of non-alcoholic mouthwash and baking soda/salt rinses every couple of hours. - Use Magic Mouthwash as prescribed.  Epistaxis Frequent epistaxis likely due to nasal irritation from chemotherapy. Platelet count is 120, not low enough to cause bleeding. Nasal passages likely affected similarly to throat irritation. - Continue using saline spray and humidifier to maintain nasal hydration.  Hypomagnesemia Magnesium  level is low at 1.4, first occurrence, requiring supplementation through fluids. - Administer magnesium  supplementation through IV fluids. - Provide additional oral magnesium  supplementation.  Constipation No bowel movement for a week. Current stool softener may be insufficient. - Consider using a stronger laxative like Miralax if needed.  Follow-up Ongoing cancer treatment necessitates regular monitoring and treatment plan adjustments. - Plan to see him next week for follow-up. - Ensure booster injection is approved and administered before the next treatment.  I reviewed lab results and outside records for this visit and discussed relevant results with the patient. Diagnosis, plan of care and treatment options were also discussed in detail with the patient. Opportunity provided to ask questions and answers provided to his apparent satisfaction. Provided instructions to call our clinic with any problems, questions or concerns prior  to return visit. I recommended to continue follow-up with PCP and sub-specialists. He verbalized understanding and agreed with the plan.   NCCN guidelines have been consulted in the planning of this patient's care.  I spent a total of 42 minutes during this encounter with the patient including review of chart and various tests results, discussions about plan of care and coordination of care plan.   [name], MD  07/26/2024 5:00 PM  [facility] - A DEPT OF [facility] [address] [place] [address] Dept: [phone] Dept Fax: [phone]    CHIEF COMPLAINT/ REASON FOR VISIT:   Squamous cell carcinoma of the left tonsil/base of tongue. cT2,cN1,cM0,p16+, Stage 1 disease.  Concern for extranodal extension based on imaging.    Current Treatment:  Started concurrent chemoradiation with weekly cisplatin from 06/27/2024.  INTERVAL HISTORY:    Discussed the use of AI scribe software for clinical note transcription with the patient, who gave verbal consent to proceed.  History of Present Illness  JAZPER NIKOLAI is a 73 year old male undergoing radiation therapy who presents with increased pain and voice loss.  He is currently in week five of his radiation treatment and is experiencing increased soreness and has lost his voice. His current pain medication, oxycodone  10 mg taken every four to six hours, is no longer effective. He is experiencing dry, thick saliva and dryness in his mouth, which has not improved with biotin.  He experiences frequent epistaxis, particularly at night, and is using saline and a humidifier to manage this. His platelet count is 120.  His creatinine level is stable at 1.15, but his magnesium  level has dropped to 1.4. He has not had a bowel movement for a  week.  His white blood cell count has decreased from 3600 to 1900, with a neutrophil count of 1400.   I have reviewed the past  medical history, past surgical history, social history and family history with the patient and they are unchanged from previous note.  HISTORY OF PRESENT ILLNESS:   ONCOLOGY HISTORY:   He presented to his PCP with complains of chronic pharyngitis and a left submandibular mass that has persisted over the past year.   To further investigate his symptoms, he underwent a CT neck and CT chest on 04/06/24 showing a 3.0 x 2.4 x 2.5 cm soft tissue mass centered in the left oropharynx at the glossotonsillar sulcus with possible involvement of the adjacent tongue base. 7 mm low-attenuation left level IB lymph node likely reflecting a site of nodal metastatic disease. 1.7 x 1.7 cm irregular soft tissue focus at the left level II station, inseparable from the adjacent sternocleidomastoid muscle. This is suspicious for nodal metastatic disease (with findings concerning for extracapsular extension) or a tumor deposit. 9 mm short-axis low attenuation left level III lymph node likely reflecting a site of nodal metastatic disease. Scans also noted scattered tiny pulmonary nodules are unchanged and likely benign but with only 5 months of documented stability but with recommend attention on follow-up given his history of prostate cancer.    Subsequently, the patient was referred to Dr. Graig on 04/24/24 who performed a FNA biopsy of left neck. However, biopsy was inconclusive. As a result, he then underwent a laryngoscopy with biopsy on 05/15/24 under the care of Dr. Lauralee. 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 Tumor is moderately differentiated with foci of poor differentiation and invasion into the striated muscle. Immunohistochemical studies are p16, p40, and HRHPV are positive.    He had a cervical spine MRI performed on 05/09/24 revealing no evidence of metastatic disease to the cervical spine itself but did indicated degenerative spondylosis  at C4-5 and C5-6. 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 PET scan performed on 04/26/24 showing FDG avid mass along the left tongue base/oropharynx with left cervical chain nodal metastatic disease with no suspicious pulmonary or mediastinal FDG uptake.   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    Prior cancers, if any: history of prostate cancer.   Given extracapsular extension with lymph node positive disease, plan made to proceed with concurrent chemoradiation.   Plan made for weekly cisplatin with radiation, if renal function allows.  Otherwise we will use either docetaxel or carboplatin/paclitaxel regimen.  Started concurrent chemoradiation with weekly cisplatin from 06/27/2024.  Creatinine went up to 1.44 on 07/12/2024.  Dose reduced cisplatin by 50% with cycle 3 as a result.  With normalization of creatinine, cisplatin was resumed at standard dose from cycle 4 onwards.  Oncology History  Malignant neoplasm of prostate (HCC)  01/19/2023 Cancer Staging   Staging form: Prostate, AJCC 8th Edition - Clinical stage from 01/19/2023: Stage IIC (cT2a, cN0, cM0, PSA: 6, Grade Group: 4) - Signed by Sherwood Rise, PA-C on 03/30/2023 Histopathologic type: Adenocarcinoma, NOS Stage prefix: Initial diagnosis Prostate specific antigen (PSA) range: Less than 10 Gleason primary pattern: 4 Gleason secondary pattern: 4 Gleason score: 8 Histologic grading system: 5 grade system Number of biopsy cores examined: 14 Number of biopsy cores positive: 5 Location of positive needle core biopsies: One  side   02/24/2023 Initial Diagnosis   Malignant neoplasm of prostate (HCC)   Primary squamous cell carcinoma of oropharynx (HCC)  06/07/2024 Initial Diagnosis   Malignant neoplasm of base of tongue (HCC)   06/07/2024 Cancer Staging   Staging form: Pharynx - HPV-Mediated Oropharynx, AJCC 8th  Edition - Clinical stage from 06/07/2024: Stage I (cT2, cN1, cM0, p16+) - Signed by Izell Domino, MD on 06/07/2024 Stage prefix: Initial diagnosis   06/28/2024 -  Chemotherapy   Patient is on Treatment Plan : HEAD/NECK Cisplatin (40) q7d         REVIEW OF SYSTEMS:   Review of Systems - Oncology  All other pertinent systems were reviewed with the patient and are negative.  ALLERGIES: He has no known allergies.  MEDICATIONS:  Current Outpatient Medications  Medication Sig Dispense Refill   albuterol  (VENTOLIN  HFA) 108 (90 Base) MCG/ACT inhaler Inhale 1-2 puffs into the lungs every 6 (six) hours as needed for wheezing or shortness of breath.     allopurinol  (ZYLOPRIM ) 300 MG tablet Take 300 mg by mouth daily.     ALPRAZolam  (XANAX ) 1 MG tablet Take 1 mg by mouth at bedtime as needed for anxiety or sleep.     aspirin  EC 81 MG tablet Take 81 mg by mouth daily. Swallow whole.     celecoxib (CELEBREX) 200 MG capsule  (Patient taking differently: Take 200 mg by mouth 2 (two) times daily. Not taking at this time)     cholecalciferol (VITAMIN D3) 25 MCG (1000 UNIT) tablet Take 1,000 Units by mouth daily.     dexamethasone  (DECADRON ) 4 MG tablet Take 2 tablets (8 mg) by mouth daily x 3 days starting the day after cisplatin chemotherapy. Take with food. 30 tablet 1   ezetimibe  (ZETIA ) 10 MG tablet TAKE 1 TABLET BY MOUTH DAILY 90 tablet 3   finasteride  (PROSCAR ) 5 MG tablet Take 1.25 mg by mouth daily.     HYDROcodone -acetaminophen  (NORCO/VICODIN) 5-325 MG tablet Take 1 tablet by mouth every 6 (six) hours as needed for severe pain.     ibuprofen (ADVIL) 200 MG tablet Take 600 mg by mouth daily as needed for moderate pain (pain score 4-6).     lidocaine  (XYLOCAINE ) 2 % solution Patient: Mix 1part 2% viscous lidocaine , 1part H20. Swish & swallow 10mL of diluted mixture, 30min before meals and at bedtime, up to QID 200 mL 3   lidocaine -prilocaine  (EMLA ) cream Apply to affected area once 30 g 3    losartan  (COZAAR ) 50 MG tablet Take 1 tablet (50 mg total) by mouth daily. 90 tablet 3   magic mouthwash (nystatin, lidocaine , diphenhydrAMINE , alum & mag hydroxide) suspension Swish and swallow 5 mLs by mouth 4 (four) times daily as needed for mouth pain. Suspension contains equal amounts of Maalox Extra Strength, nystatin, diphenhydramine  and lidocaine . Swish and Swallow 140 mL 2   metaxalone (SKELAXIN) 800 MG tablet Take 400 mg by mouth 3 (three) times daily as needed for muscle spasms.     morphine  (MS CONTIN ) 15 MG 12 hr tablet Take 1 tablet (15 mg total) by mouth every 12 (twelve) hours. 60 tablet 0   nitroGLYCERIN  (NITROSTAT ) 0.4 MG SL tablet Place 1 tablet (0.4 mg total) under the tongue every 5 (five) minutes as needed for chest pain. 90 tablet 3   ondansetron  (ZOFRAN ) 8 MG tablet Take 1 tablet (8 mg total) by mouth every 8 (eight) hours as needed for nausea or vomiting. Start on the third day after cisplatin.  30 tablet 1   oxyCODONE  10 MG TABS Take 1 tablet (10 mg total) by mouth every 6 (six) hours as needed. 120 tablet 0   pantoprazole  (PROTONIX ) 40 MG tablet Take 1 tablet (40 mg total) by mouth 2 (two) times daily. 180 tablet 3   polyethylene glycol (MIRALAX / GLYCOLAX) 17 g packet Take 17 g by mouth daily as needed for moderate constipation.     prochlorperazine  (COMPAZINE ) 10 MG tablet Take 1 tablet (10 mg total) by mouth every 6 (six) hours as needed (Nausea or vomiting). 30 tablet 1   rosuvastatin  (CRESTOR ) 40 MG tablet Take 1 tablet (40 mg total) by mouth daily. 90 tablet 3   Tetrahydrozoline HCl (REDNESS RELIEVER EYE DROPS OP) Place 1 drop into both eyes daily as needed (redness).     TRELEGY ELLIPTA 100-62.5-25 MCG/ACT AEPB Inhale 1 puff into the lungs daily.     No current facility-administered medications for this visit.   Facility-Administered Medications Ordered in Other Visits  Medication Dose Route Frequency Provider Last Rate Last Admin   0.9 %  sodium chloride  infusion    Intravenous Continuous Gavon Majano, MD   Stopped at 07/26/24 1601     VITALS:   Blood pressure (!) 90/48, pulse 97, temperature (!) 97.3 F (36.3 C), temperature source Temporal, resp. rate 16, height 5' 9 (1.753 m), weight 172 lb 4.8 oz (78.2 kg), SpO2 100%.  Wt Readings from Last 3 Encounters:  07/26/24 172 lb 4.8 oz (78.2 kg)  07/21/24 179 lb 2 oz (81.3 kg)  07/19/24 173 lb 11.2 oz (78.8 kg)    Body mass index is 25.44 kg/m.   Onc Performance Status - 07/26/24 0920       ECOG Perf Status   ECOG Perf Status Restricted in physically strenuous activity but ambulatory and able to carry out work of a light or sedentary nature, e.g., light house work, office work      KPS SCALE   KPS % SCORE Normal activity with effort, some s/s of disease             PHYSICAL EXAM:   Physical Exam Constitutional:      General: He is not in acute distress.    Appearance: Normal appearance.  HENT:     Head: Normocephalic and atraumatic.     Mouth/Throat:     Comments: Signs of mild mucositis with erythema in the back of the throat.  No definite thrush. Eyes:     Conjunctiva/sclera: Conjunctivae normal.  Neck:     Comments:  2 cm, relatively firm mass at the angle of left jaw Cardiovascular:     Rate and Rhythm: Normal rate and regular rhythm.  Pulmonary:     Effort: Pulmonary effort is normal. No respiratory distress.  Chest:     Comments: Right-sided Port-A-Cath in place without any signs of infection Abdominal:     General: There is no distension.     Comments: Feeding tube in place  Neurological:     General: No focal deficit present.     Mental Status: He is alert and oriented to person, place, and time.  Psychiatric:        Mood and Affect: Mood normal.        Behavior: Behavior normal.       LABORATORY DATA:   I have reviewed the data as listed.  Results for orders placed or performed in visit on 07/26/24  CBC with Differential (Cancer Center Only)  Result  Value Ref Range   WBC Count 1.9 (L) 4.0 - 10.5 K/uL   RBC 3.88 (L) 4.22 - 5.81 MIL/uL   Hemoglobin 12.2 (L) 13.0 - 17.0 g/dL   HCT 65.6 (L) 60.9 - 47.9 %   MCV 88.4 80.0 - 100.0 fL   MCH 31.4 26.0 - 34.0 pg   MCHC 35.6 30.0 - 36.0 g/dL   RDW 87.3 88.4 - 84.4 %   Platelet Count 120 (L) 150 - 400 K/uL   nRBC 0.0 0.0 - 0.2 %   Neutrophils Relative % 72 %   Neutro Abs 1.4 (L) 1.7 - 7.7 K/uL   Lymphocytes Relative 14 %   Lymphs Abs 0.3 (L) 0.7 - 4.0 K/uL   Monocytes Relative 12 %   Monocytes Absolute 0.2 0.1 - 1.0 K/uL   Eosinophils Relative 0 %   Eosinophils Absolute 0.0 0.0 - 0.5 K/uL   Basophils Relative 1 %   Basophils Absolute 0.0 0.0 - 0.1 K/uL   WBC Morphology See Note    RBC Morphology MORPHOLOGY UNREMARKABLE    Smear Review Normal platelet morphology    Immature Granulocytes 1 %   Abs Immature Granulocytes 0.01 0.00 - 0.07 K/uL  Basic Metabolic Panel - Cancer Center Only  Result Value Ref Range   Sodium 132 (L) 135 - 145 mmol/L   Potassium 3.5 3.5 - 5.1 mmol/L   Chloride 98 98 - 111 mmol/L   CO2 26 22 - 32 mmol/L   Glucose, Bld 116 (H) 70 - 99 mg/dL   BUN 26 (H) 8 - 23 mg/dL   Creatinine 8.84 9.38 - 1.24 mg/dL   Calcium  9.1 8.9 - 10.3 mg/dL   GFR, Estimated >39 >39 mL/min   Anion gap 8 5 - 15  Magnesium   Result Value Ref Range   Magnesium  1.4 (L) 1.7 - 2.4 mg/dL  Results for orders placed or performed in visit on 07/26/24  Rad Onc Aria Session Summary  Result Value Ref Range   Course ID [id number]    Course Start Date 06/19/2024    Session Number 22    Course First Treatment Date 06/27/2024  2:31 PM    Course Last Treatment Date 07/26/2024  7:51 AM    Course Elapsed Days 29    Reference Point ID HN_Oroph dp    Reference Point Dosage Given to Date 44 Gy   Reference Point Session Dosage Given 2 Gy   Plan ID HN_Oroph    Plan Name HN_Oroph    Plan Fractions Treated to Date 22    Plan Total Fractions Prescribed 35    Plan Prescribed Dose Per Fraction 2 Gy   Plan Total  Prescribed Dose 70.000000 Gy   Plan Primary Reference Point HN_Oroph dp       RADIOGRAPHIC STUDIES:  No recent pertinent imaging available to review.   CODE STATUS:  Code Status History     Date Active Date Inactive Code Status Order ID Comments User Context   06/23/2024 1257 06/24/2024 0510 Full Code [id number]  Hughes Simmonds, MD HOV   06/23/2024 1257 06/23/2024 1257 Full Code [id number]  Hughes Simmonds, MD HOV   04/29/2023 1830 04/30/2023 1811 Full Code [id number]  Renda Glance, MD Inpatient   06/05/2022 0846 06/05/2022 1634 Full Code [id number]  Claudene Victory ORN, MD Inpatient    Questions for Most Recent Historical Code Status (Order [id number])     Question Answer   By: Consent: discussion documented in EHR  No orders of the defined types were placed in this encounter.    Future Appointments  Date Time Provider Department Center  07/27/2024  1:30 PM Jacelyn Lupita NOVAK, CCC-SLP [facility]  07/27/2024  3:00 PM [facility] LINAC 3 [facility] None  07/28/2024 10:00 AM [facility] LINAC 3 [facility] None  07/28/2024  1:45 PM [facility] INFUSION [facility] None  07/29/2024  9:30 AM [facility] FLUSH [facility] None  07/31/2024 10:00 AM [facility] LINAC 3 [facility] None  07/31/2024 10:15 AM LINAC-SQUIRE [facility] None  08/01/2024 10:00 AM [facility] LINAC 3 [facility] None  08/02/2024  8:15 AM [facility] [id number] [facility] None  08/02/2024  8:45 AM [facility] FLUSH [facility] None  08/02/2024  9:15 AM Elycia Woodside, MD [facility] None  08/02/2024 10:00 AM [facility] INFUSION [facility] None  08/02/2024 11:15 AM Ivonne Harlene RAMAN, RD [facility] None  08/03/2024 10:00 AM [facility] LINAC 3 [facility] None  08/04/2024 10:00 AM [facility] LINAC 3 [facility] None  08/07/2024 10:00 AM [facility] LINAC 3 [facility] None  08/08/2024 10:00 AM [facility] LINAC 3 [facility] None  08/09/2024  7:30 AM [facility] FLUSH [facility] None  08/09/2024  8:00 AM  [facility] INFUSION [facility] None  08/09/2024  8:45 AM Alonso Gapinski, MD [facility] None  08/09/2024 10:00 AM [facility] LINAC 3 [facility] None  08/10/2024 10:00 AM [facility] LINAC 3 [facility] None  08/11/2024 10:00 AM [facility] LINAC 3 [facility] None  08/14/2024 10:00 AM [facility] LINAC 3 [facility] None  09/06/2024 10:00 AM Breedlove Blue, Blaire L, PT [facility] None  12/22/2024  3:30 PM [facility] 1 [facility] [place]      This document was completed utilizing speech recognition software. Grammatical errors, random word insertions, pronoun errors, and incomplete sentences are an occasional consequence of this system due to software limitations, ambient noise, and hardware issues. Any formal questions or concerns about the content, text or information contained within the body of this dictation should be directly addressed to the provider for clarification.

## 2024-07-26 NOTE — Assessment & Plan Note (Signed)
 He experiences significant pain, with oxycodone  10 mg every 4-6 hours proving ineffective. Discussed transitioning to long-acting analgesics, opting for morphine  due to its immediate effect and dosage flexibility, as he can still swallow pills. Fentanyl  patch was considered but deemed less suitable due to delayed onset and limited adjustability. - Prescribe morphine  15 mg twice daily, with the option to double the dose if needed. - Continue oxycodone  10 mg for breakthrough pain as needed.

## 2024-07-26 NOTE — Assessment & Plan Note (Signed)
 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   CT scan from July 2025 showed lymph nodes with extranodal extension, indicating a higher risk of recurrence without appropriate treatment. The cancer is aggressive due to lymph node involvement but has not spread beyond the head and neck area.   cT2,cN1,cM0,p16+, Stage 1 disease.  Concern for extranodal extension based on imaging.  Previously I discussed staging, prognosis, plan of care, treatment options.  Reviewed NCCN guidelines.  We have discussed about role of cisplatin being a radiosensitizer in the treatment of head and neck cancer.  We have discussed about the curative intent of chemoradiation for this patient.  Patient is willing to proceed with weekly cisplatin.  Emphasized the importance of hydration to protect kidney function. The treatment plan includes seven weeks of radiation and weekly chemotherapy, with the possibility of holding chemotherapy towards the end if side effects become problematic. The role of chemotherapy is supportive, making radiation more effective.   Labs today showed creatinine of 1.15, improved with normal eGFR.  Leukopenia with white count of 1900 but ANC is 1400.  Platelet count 120,000. We will proceed with standard dose of cisplatin at 40 mg/m with cycle 5 today and arrange for G-CSF support with Zarxio 300 mcg subcutaneously daily for 2 days starting tomorrow, pending authorization.  We will arrange for IV fluids on Monday, Wednesday, Friday each week, going forward to maintain his creatinine at a safe level.   He is to continue radiation treatments as scheduled.    We again emphasized the importance of hydration with him.  He has been making a good effort and maintaining hydration.  - Monitor kidney function and electrolytes weekly during treatment - Previously referred to physical therapy, speech language  pathology, and nutritionist for supportive care  RTC in 1 week for labs, follow-up and continuation of chemotherapy.

## 2024-07-26 NOTE — Progress Notes (Signed)
 Nutrition Follow-up:  Pt with primary SCC of oropharynx, p16+.  He is receiving concurrent chemoradiation with weekly cisplatin (start RT 10/7). Patient is under the care of Dr. Izell and Dr. Autumn.    S/p PEG - 10/3  Met with patient in infusion. He has lost his voice and reports throat is dry, raw, and bloody. Speaking and swallowing are painful. Patient is utilizing pain medication which helps some. Patient is not using tube other than FWF. Reports drinking 4 Boost VHC. Patient is constipated. Taking miralax daily. Last BM was 5 days ago. Denies nausea or vomiting. He was able to play golf over the weekend. Says that was his last game for a while.   Medications: morphine  (today)  Labs: Mg 1.4, Na 132, glucose 116, BUN 26  Anthropometrics: Wt 172 lb 4.8 oz  today   10/29 173 lb 11.2 oz  Estimated Energy Needs  Kcals: 2160-2330 Protein: 99-116 Fluid: >2.1 L  NUTRITION DIAGNOSIS: Predicted sub-optimal intake continues - addressing with ONS   INTERVENTION:  Continue Boost VHC - one carton QID provides 2120 kcal, 92 g protein Suggested Mg citrate for constipation. Educated to give half bottle via tube followed by water  flush. If no BM in 30 minutes, give other half  Continue miralax - may need to increase dose given scheduled pain medications  IVF 3x/week per MD Support and encouragement     MONITORING, EVALUATION, GOAL: wt trends, intake, TF, BM    NEXT VISIT: Wednesday November 12 during infusion

## 2024-07-27 ENCOUNTER — Other Ambulatory Visit: Payer: Self-pay

## 2024-07-27 ENCOUNTER — Ambulatory Visit: Attending: Radiation Oncology

## 2024-07-27 ENCOUNTER — Ambulatory Visit
Admission: RE | Admit: 2024-07-27 | Discharge: 2024-07-27 | Disposition: A | Source: Ambulatory Visit | Attending: Radiation Oncology

## 2024-07-27 DIAGNOSIS — R1310 Dysphagia, unspecified: Secondary | ICD-10-CM | POA: Diagnosis not present

## 2024-07-27 DIAGNOSIS — R490 Dysphonia: Secondary | ICD-10-CM | POA: Insufficient documentation

## 2024-07-27 LAB — RAD ONC ARIA SESSION SUMMARY
Course Elapsed Days: 30
Plan Fractions Treated to Date: 23
Plan Prescribed Dose Per Fraction: 2 Gy
Plan Total Fractions Prescribed: 35
Plan Total Prescribed Dose: 70 Gy
Reference Point Dosage Given to Date: 46 Gy
Reference Point Session Dosage Given: 2 Gy
Session Number: 23

## 2024-07-27 NOTE — Therapy (Signed)
 OUTPATIENT SPEECH LANGUAGE PATHOLOGY ONCOLOGY TREATMENT   Patient Name: Gilbert Thompson MRN: [id number] DOB:Dec 18, 1950, 73 y.o., male Today's Date: 07/27/2024  PCP: Windy Coy, MD REFERRING PROVIDER: Izell Domino, MD  END OF SESSION:  End of Session - 07/27/24 1415     Visit Number 2    Number of Visits 3    Date for Recertification  09/20/24    SLP Start Time 1334    SLP Stop Time  1407    SLP Time Calculation (min) 33 min    Activity Tolerance Patient tolerated treatment well           Past Medical History:  Diagnosis Date   Asthma    Cancer (HCC)    prostate cancer   Coronary artery disease    pt states they were unable to place a stent at that time   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, follwed by [facility]   Dyspnea    with exertion   GERD (gastroesophageal reflux disease)    Hyperlipidemia    Hypertension    Pneumonia    Past Surgical History:  Procedure Laterality Date   CATARACT EXTRACTION Bilateral    IR GASTROSTOMY TUBE MOD SED  06/23/2024   IR IMAGING GUIDED PORT INSERTION  06/23/2024   LEFT HEART CATH AND CORONARY ANGIOGRAPHY N/A 06/05/2022   Procedure: LEFT HEART CATH AND CORONARY ANGIOGRAPHY;  Surgeon: Claudene Victory ORN, MD;  Location: [facility];  Service: Cardiovascular;  Laterality: N/A;   LYMPHADENECTOMY Bilateral 04/29/2023   Procedure: BILATERAL PELVIC LYMPHADENECTOMY;  Surgeon: Renda Glance, MD;  Location: [facility];  Service: Urology;  Laterality: Bilateral;   PROSTATE BIOPSY     ROBOT ASSISTED LAPAROSCOPIC RADICAL PROSTATECTOMY N/A 04/29/2023   Procedure: XI ROBOTIC ASSISTED LAPAROSCOPIC RADICAL PROSTATECTOMY LEVEL 2;  Surgeon: Renda Glance, MD;  Location: [facility];  Service: Urology;  Laterality: N/A;  210 MINUTES NEEDED FOR CASE   UPPER GASTROINTESTINAL ENDOSCOPY     VENTRAL HERNIA REPAIR N/A 10/05/2023   Procedure: LAPAROSCOPIC VENTRAL HERNIA REPAAIR WITH MESH;  Surgeon: Rubin Calamity, MD;  Location: [facility] OR;  Service:  General;  Laterality: N/A;   Patient Active Problem List   Diagnosis Date Noted   Leukopenia due to antineoplastic chemotherapy 07/26/2024   Cancer related pain 06/08/2024   Primary squamous cell carcinoma of oropharynx (HCC) 06/07/2024   Genetic testing 04/23/2023   Malignant neoplasm of prostate (HCC) 02/24/2023   Coronary artery disease involving native coronary artery of native heart without angina pectoris    DOE (dyspnea on exertion)     ONSET DATE: See pertinent history below   REFERRING DIAG:  C10.9 (ICD-10-CM) - Primary squamous cell carcinoma of oropharynx (HCC)    THERAPY DIAG:  Dysphagia, unspecified type  Voice hoarseness  Rationale for Evaluation and Treatment: Rehabilitation  SUBJECTIVE:   SUBJECTIVE STATEMENT: I know they (HEP) is important.  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 Hx: He presented to his PCP with complaints of chronic pharyngitis and a left submandibular mass that has persisted over the past year. CT scans were ordered. 04/06/24 CT neck/chest showed a 3.0 x 2.4 x 2.5 cm soft tissue mass centered in the left oropharynx at the glossotonsillar sulcus with possible involvement of the adjacent tongue base. Scans also noted scattered tiny pulmonary nodules are unchanged and likely benign but with only 5 months of documented stability but with  recommend attention on follow-up given his history of prostate cancer. 04/24/24 He saw Dr. Graig who performed a FNA biopsy of left neck. However, biopsy was inconclusive. As a result, he then underwent a laryngoscopy with biopsy on 05/15/24 under the care of Dr. Lauralee. 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 Tumor is moderately differentiated with foci of poor differentiation and invasion into the striated muscle. Immunohistochemical studies are p16, p40, and HRHPV are positive.  05/09/24 MRI cervical spine revealing no evidence of metastatic disease. 04/26/24 PET showing FDG avid mass along the left tongue base/oropharynx with left cervical chain nodal metastatic disease with no suspicious pulmonary or mediastinal FDG uptake  Consult with Dr. Izell and Dr. Autumn 06/07/24. He will receive chemotherapy and radiation. Treatment plan: He will receive 35 fractions of radiation to his Oropharynx and bilateral neck with weekly chemotherapy. He will start on 06/27/24 and complete 08/14/24. Pretreatment procedures:06/23/24 PEG/PAC  PAIN:  Are you having pain? Yes: NPRS scale: 10/10, 10/10 Pain location: neck and lt throat  Pain description: burning Aggravating factors: swallowing Relieving factors: meds  FALLS: Has patient fallen in last 6 months?  No   PATIENT GOALS: Have normal swallowing  OBJECTIVE:  Note: Objective measures were completed at Evaluation unless otherwise noted.                                                                                                                            TREATMENT DATE:   07/27/24: Pt having milkshakes, pudding, and Boost VHC for nutrition. Today with applesauce and water , pt did not exhibit oral deficits, nor overt s/sx of pharyngeal deficits, with small bites of applesauce. SLP shared about food journal with pt and he expressed he may or may not use the food journal depending on how his recovery is going. Right now cannot watch people eat. With HEP procedure, pt was mod independent. Asked if one exercise was recommended which one would it be and SLP shared this was challenging as exercises target certain muscle groups. Pt has performed HEP at suboptimal frequency however at this point in the tx schedule this can be expected. SLP reminded pt about cycling through the exercises at this point in ChRT schedule.   06/22/24: Research states the risk for dysphagia increases due to radiation and/or chemotherapy treatment due to a variety of  factors, so SLP educated the pt about the possibility of reduced/limited ability for PO intake during rad tx. SLP also educated pt regarding possible changes to swallowing musculature after rad tx, and why adherence to dysphagia HEP provided today and PO consumption was necessary to inhibit muscle fibrosis following rad tx and to mitigate muscle disuse atrophy. SLP informed pt why this would be detrimental to their swallowing status and to their pulmonary health. Pt demonstrated understanding of these things to SLP. SLP encouraged pt to safely eat and drink as deep into their radiation/chemotherapy as possible to provide the best  possible long-term swallowing outcome for pt.    SLP then developed an individualized HEP for pt involving oral and pharyngeal strengthening and ROM and pt was instructed how to perform these exercises, including SLP demonstration. After SLP demonstration, pt return demonstrated each exercise. SLP ensured pt performance was correct prior to educating pt on next exercise. Pt required usual min-mod cues faded to modified independent to perform HEP. Pt was instructed to complete this program 6-7 days/week, at least 2 times a day until 6 months after his or her last day of rad tx, and then x2 a week after that, indefinitely. Among other modifications for days when pt cannot functionally swallow, SLP also suggested pt to perform only non-swallowing tasks on the handout/HEP, and if necessary to cycle through the swallowing portion so the full program of exercises can be completed instead of fatiguing on one of the swallowing exercises and being unable to perform the other swallowing exercises. SLP instructed that swallowing exercises should then be added back into the regimen as pt is able to do so.   PATIENT EDUCATION: Education details: late effects head/neck radiation on swallow function, HEP procedure, and modification to HEP when difficulty experienced with swallowing during and after  radiation course Person educated: Patient Education method: Explanation, Demonstration, Verbal cues, and Handouts Education comprehension: verbalized understanding, returned demonstration, verbal cues required, and needs further education   ASSESSMENT:  CLINICAL IMPRESSION: Patient is a 73 y.o. M who was seen today for treatment of swallowing as they undergo radiation/chemoradiation therapy. Today pt ate applesauce and drank thin liquids without overt s/s oral or pharyngeal difficulty. At this time pt swallowing is deemed WNL/WFL with these POs. No oral or overt s/sx pharyngeal deficits, including aspiration were observed. There are no overt s/s aspiration PNA observed by SLP nor any reported by pt at this time. Data indicate that pt's swallow ability will likely decrease over the course of radiation/chemoradiation therapy and could very well decline over time following the conclusion of that therapy due to muscle disuse atrophy and/or muscle fibrosis. Pt will cont to need to be seen by SLP in order to assess safety of PO intake, assess the need for recommending any objective swallow assessment, and ensuring pt is correctly completing the individualized HEP.  OBJECTIVE IMPAIRMENTS: include dysphagia. These impairments are limiting patient from safety when swallowing. Factors affecting potential to achieve goals and functional outcome are none noted today. Patient will benefit from skilled SLP services to address above impairments and improve overall function.   REHAB POTENTIAL: Good     GOALS: Goals reviewed with patient? No   SHORT TERM GOALS: Target: 3rd total session   1. Pt will complete HEP with modified independence in 2 sessions Baseline: 07/27/24 Goal status: INITIAL   2.  pt will tell SLP why pt is completing HEP with modified independence Baseline:  Goal status: met   3.  pt will describe 3 overt s/s aspiration PNA with modified independence Baseline:  Goal status: INITIAL    4.  pt will tell SLP how a food journal could hasten return to a more normalized diet Baseline:  Goal status: met     LONG TERM GOALS: Target: 7th total session   1.  pt will complete HEP with independence over two visits Baseline:  Goal status: INITIAL   2.  pt will describe how to modify HEP over time, and the timeline associated with reduction in HEP frequency with modified independence over two sessions Baseline:  Goal status:  INITIAL     PLAN:   SLP FREQUENCY:  once approx every 4 weeks   SLP DURATION:  7 sessions   PLANNED INTERVENTIONS: Aspiration precaution training, Pharyngeal strengthening exercises, Diet toleration management , Trials of upgraded texture/liquids, SLP instruction and feedback, Compensatory strategies, and Patient/family education, [phone] (treatment of swallowing dysfunction and/or oral function for feeding)   Mahad Newstrom, CCC-SLP 07/27/2024, 2:15 PM

## 2024-07-28 ENCOUNTER — Other Ambulatory Visit: Payer: Self-pay

## 2024-07-28 ENCOUNTER — Inpatient Hospital Stay

## 2024-07-28 ENCOUNTER — Ambulatory Visit
Admission: RE | Admit: 2024-07-28 | Discharge: 2024-07-28 | Disposition: A | Discharge status: Home / Self Care | Source: Ambulatory Visit | Attending: Radiation Oncology | Admitting: Radiation Oncology

## 2024-07-28 VITALS — BP 126/94 | HR 85 | Temp 98.3°F | Resp 16

## 2024-07-28 DIAGNOSIS — C109 Malignant neoplasm of oropharynx, unspecified: Secondary | ICD-10-CM

## 2024-07-28 LAB — RAD ONC ARIA SESSION SUMMARY
Course Elapsed Days: 31
Plan Fractions Treated to Date: 24
Plan Prescribed Dose Per Fraction: 2 Gy
Plan Total Fractions Prescribed: 35
Plan Total Prescribed Dose: 70 Gy
Reference Point Dosage Given to Date: 48 Gy
Reference Point Session Dosage Given: 2 Gy
Session Number: 24

## 2024-07-28 MED ORDER — FILGRASTIM-SNDZ 300 MCG/0.5ML IJ SOSY
300.0000 ug | PREFILLED_SYRINGE | Freq: Once | INTRAMUSCULAR | Status: AC
  Administered 2024-07-28: 300 ug via SUBCUTANEOUS
  Filled 2024-07-28: qty 0.5

## 2024-07-28 MED ORDER — SODIUM CHLORIDE 0.9 % IV SOLN: Freq: Once | INTRAVENOUS | Status: AC

## 2024-07-28 NOTE — Patient Instructions (Signed)
 Sodium Chloride  Solution What is this medication? SODIUM CHLORIDE  (SOE dee um KLOOR ide) prevents and treats low levels of sodium in your body. Sodium plays an important role in your hydration level and the health of your muscles and nervous system. This medicine may be used for other purposes; ask your health care provider or pharmacist if you have questions. What should I tell my care team before I take this medication? They need to know if you have any of these conditions: Heart disease High blood pressure High levels of sodium in the blood Kidney disease Liver disease Low salt or sodium diet An unusual or allergic reaction to any medications, foods, dyes, or preservatives Pregnant or trying to get pregnant Breast-feeding How should I use this medication? Take this medication by mouth. Take it as directed on the prescription label at the same time every day. You can take it with or without food. If it upsets your stomach, take it with food. You may mix this medication with water or juice. Take the dose right away after mixing. If giving this medication to an infant, you may mix it with formula or breast milk. Give the dose right away after mixing. Use a specially marked oral syringe, spoon, or dropper to measure each dose. Ask your pharmacist if you do not have one. Household spoons are not accurate. Talk to your care team about the use of this medication in children. While it may be given to children for selected conditions, precautions do apply. Overdosage: If you think you have taken too much of this medicine contact a poison control center or emergency room at once. NOTE: This medicine is only for you. Do not share this medicine with others. What if I miss a dose? If you take this medication on a regular basis, take it as soon as you can. If it is almost time for your next dose, take only that dose. Do not take double or extra doses. What may interact with this  medication? Lithium Steroid medications, such as prednisone or cortisone This list may not describe all possible interactions. Give your health care provider a list of all the medicines, herbs, non-prescription drugs, or dietary supplements you use. Also tell them if you smoke, drink alcohol , or use illegal drugs. Some items may interact with your medicine. What should I watch for while using this medication? Visit your care team for regular checks on your progress. Tell your care team if your symptoms do not start to get better or if they get worse. You may need bloodwork while taking this medication. What side effects may I notice from receiving this medication? Side effects that you should report to your care team as soon as possible: Allergic reactions--skin rash, itching, hives, swelling of the face, lips, tongue, or throat High sodium level--confusion, increased thirst, muscle weakness, unusual weakness or fatigue, twitching muscles Side effects that usually do not require medical attention (report these to your care team if they continue or are bothersome): Dizziness Fatigue Headache Nausea Vomiting This list may not describe all possible side effects. Call your doctor for medical advice about side effects. You may report side effects to FDA at 1-800-FDA-1088. Where should I keep my medication? Keep out of the reach of children and pets. Store at room temperature between 20 and 25 degrees C (68 and 77 degrees F). Get rid of any unused medication after it expires or 90 days after opening, whichever is first. To get rid of medications that are no  longer needed or have expired: Take the medication to a medication take-back program. Check with your pharmacy or law enforcement to find a location. If you cannot return the medication, check the label or package insert to see if the medication should be thrown out in the garbage or flushed down the toilet. If you are not sure, ask your care  team. If it is safe to put it in the trash, pour the medication out of the container. Mix the medication with cat litter, dirt, coffee grounds, or other unwanted substance. Seal the mixture in a bag or container. Put it in the trash. NOTE: This sheet is a summary. It may not cover all possible information. If you have questions about this medicine, talk to your doctor, pharmacist, or health care provider.  2024 Elsevier/Gold Standard (2021-07-17 00:00:00)

## 2024-07-29 ENCOUNTER — Inpatient Hospital Stay

## 2024-07-29 VITALS — BP 90/56 | HR 107 | Temp 97.9°F | Resp 17

## 2024-07-29 DIAGNOSIS — C109 Malignant neoplasm of oropharynx, unspecified: Secondary | ICD-10-CM

## 2024-07-29 MED ORDER — FILGRASTIM-SNDZ 300 MCG/0.5ML IJ SOSY
300.0000 ug | PREFILLED_SYRINGE | Freq: Once | INTRAMUSCULAR | Status: AC
  Administered 2024-07-29: 300 ug via SUBCUTANEOUS
  Filled 2024-07-29: qty 0.5

## 2024-07-29 NOTE — Progress Notes (Signed)
 Inbasket message sent to Dr. Pietro in [facility] regarding patient's low BP readings.

## 2024-07-29 NOTE — Progress Notes (Signed)
 Patient presented for injection appointment with BP 90/56 .  Denied lightheadedness, dizziness, SOB, falls.  RN instructed patient to follow up with PCP to reevaluate losartan  dose.  Patient advised to seek emergent medical assistance if symptomatic.  Patient verbalized understanding.  Patient verbalized that he will continue to measure blood pressure at home.

## 2024-07-31 ENCOUNTER — Telehealth: Payer: Self-pay | Admitting: *Deleted

## 2024-07-31 ENCOUNTER — Ambulatory Visit
Admission: RE | Admit: 2024-07-31 | Discharge: 2024-07-31 | Disposition: A | Discharge status: Home / Self Care | Source: Ambulatory Visit | Attending: Radiation Oncology | Admitting: Radiation Oncology

## 2024-07-31 ENCOUNTER — Other Ambulatory Visit: Payer: Self-pay

## 2024-07-31 DIAGNOSIS — C109 Malignant neoplasm of oropharynx, unspecified: Secondary | ICD-10-CM

## 2024-07-31 DIAGNOSIS — Z510 Encounter for antineoplastic radiation therapy: Secondary | ICD-10-CM | POA: Diagnosis not present

## 2024-07-31 DIAGNOSIS — C090 Malignant neoplasm of tonsillar fossa: Secondary | ICD-10-CM | POA: Diagnosis not present

## 2024-07-31 LAB — BASIC METABOLIC PANEL - CANCER CENTER ONLY
Anion gap: 10 (ref 5–15)
BUN: 32 mg/dL — ABNORMAL HIGH (ref 8–23)
CO2: 26 mmol/L (ref 22–32)
Calcium: 8.9 mg/dL (ref 8.9–10.3)
Chloride: 96 mmol/L — ABNORMAL LOW (ref 98–111)
Creatinine: 1.36 mg/dL — ABNORMAL HIGH (ref 0.61–1.24)
GFR, Estimated: 55 mL/min — ABNORMAL LOW (ref >60)
Glucose, Bld: 128 mg/dL — ABNORMAL HIGH (ref 70–99)
Potassium: 3.6 mmol/L (ref 3.5–5.1)
Sodium: 132 mmol/L — ABNORMAL LOW (ref 135–145)

## 2024-07-31 LAB — RAD ONC ARIA SESSION SUMMARY
Course Elapsed Days: 34
Plan Fractions Treated to Date: 25
Plan Prescribed Dose Per Fraction: 2 Gy
Plan Total Fractions Prescribed: 35
Plan Total Prescribed Dose: 70 Gy
Reference Point Dosage Given to Date: 50 Gy
Reference Point Session Dosage Given: 2 Gy
Session Number: 25

## 2024-07-31 NOTE — Telephone Encounter (Signed)
 My chart message sent to patient with dr pietro recommendations.

## 2024-07-31 NOTE — Telephone Encounter (Signed)
-----   Message from Redell Shallow sent at 07/29/2024 10:15 AM EST ----- Regarding: FW: Discontinue losartan  and follow blood pressure. BC ----- Message ----- From: Amison, Alyssa M, LPN Sent: [id number]   9:59 AM EST To: Redell GORMAN Shallow, MD  Good morning,  I am a nurse at the cancer center. We just saw Gilbert Thompson today for his GCSM injection. His BP readings were low: 81/49, 84/50 via the machine. His manual reading was 95/60. He reported that he took his BP medication this morning but he doesn't check his BP beforehand. You can look at his previous BP readings in his chart and see that they have been low also. We advised him to notify you but he said he felt fine. I just wanted to make sure you were aware. Thank you.

## 2024-08-01 ENCOUNTER — Other Ambulatory Visit: Payer: Self-pay

## 2024-08-01 ENCOUNTER — Inpatient Hospital Stay

## 2024-08-01 ENCOUNTER — Ambulatory Visit
Admission: RE | Admit: 2024-08-01 | Discharge: 2024-08-01 | Disposition: A | Discharge status: Home / Self Care | Source: Ambulatory Visit | Attending: Radiation Oncology | Admitting: Radiation Oncology

## 2024-08-01 VITALS — BP 124/55 | HR 89 | Temp 97.2°F | Resp 16 | Wt 171.5 lb

## 2024-08-01 DIAGNOSIS — C109 Malignant neoplasm of oropharynx, unspecified: Secondary | ICD-10-CM

## 2024-08-01 LAB — RAD ONC ARIA SESSION SUMMARY
Course Elapsed Days: 35
Plan Fractions Treated to Date: 26
Plan Prescribed Dose Per Fraction: 2 Gy
Plan Total Fractions Prescribed: 35
Plan Total Prescribed Dose: 70 Gy
Reference Point Dosage Given to Date: 52 Gy
Reference Point Session Dosage Given: 2 Gy
Session Number: 26

## 2024-08-01 MED ORDER — SODIUM CHLORIDE 0.9 % IV SOLN: Freq: Once | INTRAVENOUS | Status: AC

## 2024-08-01 NOTE — Patient Instructions (Signed)

## 2024-08-02 ENCOUNTER — Other Ambulatory Visit (HOSPITAL_COMMUNITY): Payer: Self-pay

## 2024-08-02 ENCOUNTER — Inpatient Hospital Stay

## 2024-08-02 ENCOUNTER — Inpatient Hospital Stay: Admitting: Dietician

## 2024-08-02 ENCOUNTER — Encounter: Payer: Self-pay | Admitting: Oncology

## 2024-08-02 ENCOUNTER — Ambulatory Visit
Admission: RE | Admit: 2024-08-02 | Discharge: 2024-08-02 | Disposition: A | Discharge status: Home / Self Care | Source: Ambulatory Visit | Attending: Radiation Oncology | Admitting: Radiation Oncology

## 2024-08-02 ENCOUNTER — Inpatient Hospital Stay: Admitting: Oncology

## 2024-08-02 ENCOUNTER — Other Ambulatory Visit: Payer: Self-pay

## 2024-08-02 VITALS — BP 90/60 | HR 106 | Temp 97.2°F | Resp 16 | Ht 69.0 in | Wt 172.0 lb

## 2024-08-02 DIAGNOSIS — C109 Malignant neoplasm of oropharynx, unspecified: Secondary | ICD-10-CM

## 2024-08-02 DIAGNOSIS — C090 Malignant neoplasm of tonsillar fossa: Secondary | ICD-10-CM

## 2024-08-02 LAB — CBC WITH DIFFERENTIAL (CANCER CENTER ONLY)
Abs Immature Granulocytes: 0.02 K/uL (ref 0.00–0.07)
Basophils Absolute: 0 K/uL (ref 0.0–0.1)
Basophils Relative: 1 %
Eosinophils Absolute: 0 K/uL (ref 0.0–0.5)
Eosinophils Relative: 0 %
HCT: 29.3 % — ABNORMAL LOW (ref 39.0–52.0)
Hemoglobin: 10.6 g/dL — ABNORMAL LOW (ref 13.0–17.0)
Immature Granulocytes: 2 %
Lymphocytes Relative: 12 %
Lymphs Abs: 0.1 K/uL — ABNORMAL LOW (ref 0.7–4.0)
MCH: 31.5 pg (ref 26.0–34.0)
MCHC: 36.2 g/dL — ABNORMAL HIGH (ref 30.0–36.0)
MCV: 86.9 fL (ref 80.0–100.0)
Monocytes Absolute: 0.1 K/uL (ref 0.1–1.0)
Monocytes Relative: 7 %
Neutro Abs: 0.9 K/uL — ABNORMAL LOW (ref 1.7–7.7)
Neutrophils Relative %: 78 %
Platelet Count: 52 K/uL — ABNORMAL LOW (ref 150–400)
RBC: 3.37 MIL/uL — ABNORMAL LOW (ref 4.22–5.81)
RDW: 12.7 % (ref 11.5–15.5)
WBC Count: 1.1 K/uL — ABNORMAL LOW (ref 4.0–10.5)
nRBC: 0 % (ref 0.0–0.2)

## 2024-08-02 LAB — RAD ONC ARIA SESSION SUMMARY
Course Elapsed Days: 36
Plan Fractions Treated to Date: 27
Plan Prescribed Dose Per Fraction: 2 Gy
Plan Total Fractions Prescribed: 35
Plan Total Prescribed Dose: 70 Gy
Reference Point Dosage Given to Date: 54 Gy
Reference Point Session Dosage Given: 2 Gy
Session Number: 27

## 2024-08-02 LAB — BASIC METABOLIC PANEL - CANCER CENTER ONLY
Anion gap: 7 (ref 5–15)
BUN: 21 mg/dL (ref 8–23)
CO2: 27 mmol/L (ref 22–32)
Calcium: 8.4 mg/dL — ABNORMAL LOW (ref 8.9–10.3)
Chloride: 95 mmol/L — ABNORMAL LOW (ref 98–111)
Creatinine: 1.11 mg/dL (ref 0.61–1.24)
GFR, Estimated: >60 mL/min (ref >60)
Glucose, Bld: 144 mg/dL — ABNORMAL HIGH (ref 70–99)
Potassium: 3.1 mmol/L — ABNORMAL LOW (ref 3.5–5.1)
Sodium: 129 mmol/L — ABNORMAL LOW (ref 135–145)

## 2024-08-02 LAB — MAGNESIUM: Magnesium: 1.2 mg/dL — ABNORMAL LOW (ref 1.7–2.4)

## 2024-08-02 MED ORDER — POTASSIUM CHLORIDE IN NACL 20-0.9 MEQ/L-% IV SOLN
Freq: Once | INTRAVENOUS | Status: AC
  Filled 2024-08-02: qty 1000

## 2024-08-02 MED ORDER — MAGNESIUM SULFATE 4 GM/100ML IV SOLN
4.0000 g | Freq: Once | INTRAVENOUS | Status: AC
  Administered 2024-08-02: 4 g via INTRAVENOUS
  Filled 2024-08-02: qty 100

## 2024-08-02 MED ORDER — POTASSIUM CHLORIDE 10 MEQ/100ML IV SOLN
10.0000 meq | INTRAVENOUS | Status: AC
  Administered 2024-08-02 (×2): 10 meq via INTRAVENOUS
  Filled 2024-08-02 (×2): qty 100

## 2024-08-02 NOTE — Progress Notes (Signed)
 Nutrition Follow-up:  Pt with primary SCC of oropharynx, p16+.  He is receiving concurrent chemoradiation with weekly cisplatin (start RT 10/7). Patient is under the care of Dr. Izell and Dr. Autumn.    S/p PEG - 10/3  Treatment held today d/t labs  Met with patient and wife in infusion. He does not have much of a voice and reports feeling poorly at visit. Mornings are the most difficult as he wakes up dry and bloody. Patient pushing some nutrition orally (soup, ice cream, yogurt). Had cup of ice cream this morning in infusion. He started using tube over the weekend due to nausea. Wife giving one carton Osmolite 1.5 2x/day which he is tolerating well. Patient is constipated (no BM x4 days). Magnesium  citrate worked well over 24 hours last week. He will do this again when he gets home today. Patient will start daily bowel regimen.    Medications: reviewed   Labs: glucose 144, Na 129, K 3.1, Mg 1.6  Anthropometrics: Wt 172 lb today - stable   11/5 - 172 lb 4.8 oz  10/29 - 173 lb 11.2 oz   Estimated Energy Needs  Kcals: 2160-2330 Protein: 99-116 Fluid: >2.1 L  NUTRITION DIAGNOSIS: Predicted sub-optimal intake continues - addressing with ONS   INTERVENTION:  Encourage oral intake as tolerated Continue supplementing with TF - 2 cartons osmolite 1.5 + 2 Boost VHC Bowel regimen per MD  Support and encouragement     MONITORING, EVALUATION, GOAL: wt trends, intake, TF   NEXT VISIT: Wednesday November 19 during infusion

## 2024-08-02 NOTE — Progress Notes (Signed)
 Orders entered for Mg 4g, Kcl 10mEq/100mL x 2, and NS/56mEq K per Dr. Pasam.  Stephanne Greeley, PharmD, MBA

## 2024-08-02 NOTE — Patient Instructions (Signed)
 Fluids Given Through an IV (IV Infusion Therapy): What to Expect IV infusion therapy is a treatment to deliver a fluid, called an infusion, into a vein. You may have IV infusion to get: Fluids. Medicines. Nutrition. Chemotherapy. This is medicines to stop or slow down cancer cells. Blood or blood products. Dye that is given before an MRI or a CT scan. This is called contrast dye. Tell a health care provider about: Any allergies you have. This includes allergies to anesthesia or dyes. All medicines you take. These include vitamins, herbs, eye drops, and creams. Any bleeding problems you have. Any surgeries you've had, including if you've had lymph nodes taken out of your armpit or if you have a arteriovenous fistula for dialysis. Any medical problems you have. Whether you're pregnant or may be pregnant. Whether you've used IV drugs. What are the risks? Your health care provider will talk with you about risks. These may include: Pain, bruising, or bleeding. Infection. The IV leaking or moving out of place. Damage to blood vessels or nerves. Allergic reactions to medicines or dyes. A blood clot. An air bubble in the vein, also called an air embolism. What happens before the procedure? Eat and drink only as you've been told. Ask about changing or stopping: Any medicines you take. Any vitamins, herbs, or supplements you take. What happens during the procedure?     Placing the catheter Your skin at the IV site will be washed with fluid that kills germs. This will help prevent infection. IV infusion therapy starts with a procedure to place a soft tube called a catheter into a vein. An IV tube will be attached to the catheter to let the infusion flow into your blood. Your catheter may be placed: Into a vein that is usually in the bend of the elbow, forearm, or back of the hand. This is called a peripheral IV catheter. This may need to be put into a vein each time you get an  infusion. Into a vein near your elbow. This is called a midline catheter or a peripherally inserted central catheter (PICC). These types of catheters may stay in place for weeks or months at a time so you can receive repeated infusions through it. Into a vein near your neck that leads to your heart. This is called a non-tunneled catheter. This is only used for short amounts of time because it can cause infection. Through the skin of your chest and into a large vein that leads to your heart. This is called a tunneled catheter. This may stay in your body for months or years. Into an implanted port. An implanted port is a device that is surgically inserted under the skin of the chest to provide long-term IV access. The catheter will connect the port to a large vein in the chest or upper arm. A port may be kept in place for many months or years. Each time you have an infusion, a needle will be inserted through your skin to connect the catheter to the port. Doing the infusion To start the infusion, your provider will: Attach the IV tubing to your catheter. Use a tape or a bandage to hold the IV in place against your skin. An IV pump may be used to control the flow of the IV infusion. During the infusion, your provider will check the area to make sure: There is no bleeding, swelling, or pain. Your IV infusion is flowing correctly. After the infusion, your provider will: Take off the bandage  or tape. Disconnect the tubing from the catheter. Remove the catheter, if you have a peripheral IV. Apply pressure over the IV insertion site to stop bleeding, then cover the area with a bandage. If you have an implanted port, PICC, non-tunneled, or tunneled catheter, your catheter may remain in place. This depends on how many times you will need treatment, your medical condition, and what type of catheter you have. These steps may vary. Ask what you can expect. What can I expect after the procedure? You may be  watched closely until you leave. This includes checking your pain level, blood pressure, heart rate, and breathing rate. Your provider will check to make sure there are no signs of infection. Follow these instructions at home: Take your medicines only as told. Change or take off your bandage as told by your provider. Ask what things are safe for you to do at home. Ask when you can go back to work or school. Do not take baths, swim, or use a hot tub until you're told it's OK. Ask if you can shower. Check your IV insertion site every day for signs of infection. Check for: Redness, swelling, or pain. Fluid or blood. If fluid or blood drains from your IV site, use your hands to press down firmly on the area for a minute or two. Doing this should stop the bleeding. Warmth. Pus or a bad smell. Contact a health care provider if: You have signs of infection around your IV site. You have fluid or blood coming from your IV site that does not stop after you put pressure to the site. You have a rash or blisters. You have itchy, red, swollen areas of skin called hives. Get help right away if: You have a fever or chills. You have chest pain. You have trouble breathing. This information is not intended to replace advice given to you by your health care provider. Make sure you discuss any questions you have with your health care provider. Document Revised: 03/02/2023 Document Reviewed: 03/02/2023 Elsevier Patient Education  2024 [employer].

## 2024-08-02 NOTE — Progress Notes (Signed)
 [facility]  ONCOLOGY CLINIC PROGRESS NOTE   Patient Care Team: Windy Coy, MD as PCP - General (Family Medicine) Pietro, Redell RAMAN, MD as PCP - Cardiology (Cardiology) Vertell Pont, RN as Oncology Nurse Navigator Renda Glance, MD as Consulting Physician (Urology) Patrcia Cough, MD as Consulting Physician (Radiation Oncology) Starla Wendelyn BIRCH, RN as Registered Nurse Izell Domino, MD as Consulting Physician (Radiation Oncology) Autumn Millman, MD as Consulting Physician (Oncology) Lauralee Chew, MD as Referring Physician (Otolaryngology) Malmfelt, Delon CROME, RN as Oncology Nurse Navigator  PATIENT NAME: Gilbert Thompson   MR#: [id number] DOB: 1950/10/12  Date of visit: 08/02/2024   ASSESSMENT & PLAN:   KHRISTOPHER KAPAUN is a 73 y.o. 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  Concern for extranodal extension based on imaging.  Started concurrent chemoradiation with weekly cisplatin from 06/27/2024.  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   CT scan from July 2025 showed lymph nodes with extranodal extension, indicating a higher risk of recurrence without appropriate treatment. The cancer is aggressive due to lymph node involvement but has not spread beyond the head and neck area.   cT2,cN1,cM0,p16+, Stage 1 disease.  Concern for extranodal extension based on imaging.  Previously I discussed staging, prognosis, plan of care, treatment options.  Reviewed NCCN guidelines.  We have discussed about role of cisplatin being a radiosensitizer in the treatment of head and neck cancer.  We have discussed  about the curative intent of chemoradiation for this patient.  Patient is willing to proceed with weekly cisplatin.  Emphasized the importance of hydration to protect kidney function. The treatment plan includes seven weeks of radiation and weekly chemotherapy, with the possibility of holding chemotherapy towards the end if side effects become problematic. The role of chemotherapy is supportive, making radiation more effective.   Last week he received Zarxio for 2 days for leukopenia.  However labs today reveal persistent leukopenia with white count of 1100, ANC of 900.  Platelet count also low at 52,000.  Will hold chemotherapy this week and proceed with Zarxio for 3 days starting today.  We will arrange for IV fluids on Monday, Wednesday, Friday each week, going forward to maintain his creatinine at a safe level.   He is to continue radiation treatments as scheduled.    We again emphasized the importance of hydration with him.  He has been making a good effort and maintaining hydration.  - Monitor kidney function and electrolytes weekly during treatment - Previously referred to physical therapy, speech language pathology, and nutritionist for supportive care  RTC in 1 week for labs, follow-up and possible resumption of chemotherapy.  That would be the last of the planned chemotherapy.   Chemotherapy-induced pancytopenia Experiencing chemotherapy-induced cytopenias with neutropenia (WBC count at 900), thrombocytopenia (platelets at 50), and anemia (hemoglobin at 10.6). These cytopenias are causing fatigue and lightheadedness. Decision made to skip chemotherapy this week to allow blood counts to recover. - Administered Zarxio for 3 days to help white count recovery - Scheduled IV fluids with electrolytes today and arranged for additional fluids on Friday and Monday. - Monitor blood counts and adjust treatment plan as needed.  Chemotherapy-induced nausea and vomiting Experiencing significant  nausea, likely exacerbated by chemotherapy. Current regimen includes Zofran  and Compazine , but nausea persists. - Continue Zofran  three times a day and Compazine  four times a day for nausea control. - Arranged for IV fluids to help with hydration and potentially reduce nausea.  Chemotherapy-induced hypomagnesemia Magnesium  levels are low at 1.2, below the desired level of 1.8. This is likely due to chemotherapy. - Administered magnesium  supplementation during IV fluid administration. - Monitor magnesium  levels and adjust supplementation as needed.  Cancer-related pain Experiencing cancer-related pain, currently managed with morphine  and oxycodone . Pain management needs adjustment due to increased discomfort. - Increased morphine  extended release dose to 30 mg p.o. twice daily - Continue oxycodone  as needed for breakthrough pain.  I reviewed lab results and outside records for this visit and discussed relevant results with the patient. Diagnosis, plan of care and treatment options were also discussed in detail with the patient. Opportunity provided to ask questions and answers provided to his apparent satisfaction. Provided instructions to call our clinic with any problems, questions or concerns prior to return visit. I recommended to continue follow-up with PCP and sub-specialists. He verbalized understanding and agreed with the plan.   NCCN guidelines have been consulted in the planning of this patient's care.  I spent a total of 42 minutes during this encounter with the patient including review of chart and various tests results, discussions about plan of care and coordination of care plan.   [name], MD  08/02/2024 5:08 PM  [facility] - A DEPT OF [facility] [address] [place] [address] Dept: [phone] Dept Fax: [phone]    CHIEF COMPLAINT/ REASON FOR VISIT:   Squamous cell carcinoma of the left  tonsil/base of tongue. cT2,cN1,cM0,p16+, Stage 1 disease.  Concern for extranodal extension based on imaging.    Current Treatment:  Started concurrent chemoradiation with weekly cisplatin from 06/27/2024.  INTERVAL HISTORY:    Discussed the use of AI scribe software for clinical note transcription with the patient, who gave verbal consent to proceed.  History of Present Illness  DIJUAN SLEETH is a 73 year old male undergoing chemotherapy and radiation therapy who presents with low blood counts and nausea. He is accompanied by a caregiver, Ginnie.  He is nearing the end of his treatment cycle, with the last chemotherapy session scheduled for August 09, 2024, and the final radiation treatment on August 14, 2024. Due to low blood counts, including a white blood cell count of 900 and platelets at 50,000, chemotherapy was held this week. His kidney function remains stable with a creatinine level of 1.11, similar to last week's 1.15.  He experiences significant nausea, which is challenging to manage. He is prescribed Zofran  8 mg every eight hours, with a maximum of 24 mg per day, and Compazine , which he can take four times a day. Despite this regimen, nausea persists, and he has recently started alternating Zofran  with Compazine . He also feels lightheaded, possibly related to anemia, as his hemoglobin has decreased from 13 two weeks ago to 10.6 currently.  His electrolyte levels are low, with magnesium  notably at 1.2, below the desired level of 1.6. He is also trying to maintain hydration by drinking 80 ounces of water  daily, which contributes to a feeling of fullness and discomfort.  For pain management, he takes morphine  15 mg, which he can double to two tablets twice a day, and has oxycodone  available for breakthrough pain. He has  concerns about the effectiveness of his current pain management regimen.   I have reviewed the past medical history, past surgical history, social history and  family history with the patient and they are unchanged from previous note.  HISTORY OF PRESENT ILLNESS:   ONCOLOGY HISTORY:   He presented to his PCP with complains of chronic pharyngitis and a left submandibular mass that has persisted over the past year.   To further investigate his symptoms, he underwent a CT neck and CT chest on 04/06/24 showing a 3.0 x 2.4 x 2.5 cm soft tissue mass centered in the left oropharynx at the glossotonsillar sulcus with possible involvement of the adjacent tongue base. 7 mm low-attenuation left level IB lymph node likely reflecting a site of nodal metastatic disease. 1.7 x 1.7 cm irregular soft tissue focus at the left level II station, inseparable from the adjacent sternocleidomastoid muscle. This is suspicious for nodal metastatic disease (with findings concerning for extracapsular extension) or a tumor deposit. 9 mm short-axis low attenuation left level III lymph node likely reflecting a site of nodal metastatic disease. Scans also noted scattered tiny pulmonary nodules are unchanged and likely benign but with only 5 months of documented stability but with recommend attention on follow-up given his history of prostate cancer.    Subsequently, the patient was referred to Dr. Graig on 04/24/24 who performed a FNA biopsy of left neck. However, biopsy was inconclusive. As a result, he then underwent a laryngoscopy with biopsy on 05/15/24 under the care of Dr. Lauralee. 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 Tumor is moderately differentiated with foci of poor differentiation and invasion into the striated muscle. Immunohistochemical studies are p16, p40, and HRHPV are positive.    He had a cervical spine MRI performed on 05/09/24 revealing no evidence of metastatic disease to the cervical spine itself but did indicated degenerative spondylosis at C4-5 and C5-6. Spinal stenosis at C5-6 with AP diameter  of the canal 6.9 mm. PET scan performed on 04/26/24 showing FDG avid mass along the left tongue base/oropharynx with left cervical chain nodal metastatic disease with no suspicious pulmonary or mediastinal FDG uptake.   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    Prior cancers, if any: history of prostate cancer.   Given extracapsular extension with lymph node positive disease, plan made to proceed with concurrent chemoradiation.   Plan made for weekly cisplatin with radiation, if renal function allows.  Otherwise we will use either docetaxel or carboplatin/paclitaxel regimen.  Started concurrent chemoradiation with weekly cisplatin from 06/27/2024.  Creatinine went up to 1.44 on 07/12/2024.  Dose reduced cisplatin by 50% with cycle 3 as a result.  With normalization of creatinine, cisplatin was resumed at standard dose from cycle 4 onwards.  Oncology History  Malignant neoplasm of prostate (HCC)  01/19/2023 Cancer Staging   Staging form: Prostate, AJCC 8th Edition - Clinical stage from 01/19/2023: Stage IIC (cT2a, cN0, cM0, PSA: 6, Grade Group: 4) - Signed by Sherwood Rise, PA-C on 03/30/2023 Histopathologic type: Adenocarcinoma, NOS Stage prefix: Initial diagnosis Prostate specific antigen (PSA) range: Less than 10 Gleason primary pattern: 4 Gleason secondary pattern: 4 Gleason score: 8 Histologic grading system: 5 grade system Number of biopsy cores examined: 14 Number of biopsy cores positive: 5 Location of positive needle core biopsies: One side   02/24/2023 Initial Diagnosis   Malignant  neoplasm of prostate (HCC)   Primary squamous cell carcinoma of oropharynx (HCC)  06/07/2024 Initial Diagnosis   Malignant neoplasm of base of tongue (HCC)   06/07/2024 Cancer Staging   Staging form: Pharynx - HPV-Mediated Oropharynx, AJCC 8th Edition - Clinical stage from 06/07/2024: Stage I (cT2, cN1, cM0,  p16+) - Signed by Izell Domino, MD on 06/07/2024 Stage prefix: Initial diagnosis   06/28/2024 -  Chemotherapy   Patient is on Treatment Plan : HEAD/NECK Cisplatin (40) q7d         REVIEW OF SYSTEMS:   Review of Systems - Oncology  All other pertinent systems were reviewed with the patient and are negative.  ALLERGIES: He has no known allergies.  MEDICATIONS:  Current Outpatient Medications  Medication Sig Dispense Refill   albuterol  (VENTOLIN  HFA) 108 (90 Base) MCG/ACT inhaler Inhale 1-2 puffs into the lungs every 6 (six) hours as needed for wheezing or shortness of breath.     allopurinol  (ZYLOPRIM ) 300 MG tablet Take 300 mg by mouth daily.     ALPRAZolam  (XANAX ) 1 MG tablet Take 1 mg by mouth at bedtime as needed for anxiety or sleep.     aspirin  EC 81 MG tablet Take 81 mg by mouth daily. Swallow whole.     celecoxib (CELEBREX) 200 MG capsule  (Patient taking differently: Take 200 mg by mouth 2 (two) times daily. Not taking at this time)     cholecalciferol (VITAMIN D3) 25 MCG (1000 UNIT) tablet Take 1,000 Units by mouth daily.     dexamethasone  (DECADRON ) 4 MG tablet Take 2 tablets (8 mg) by mouth daily x 3 days starting the day after cisplatin chemotherapy. Take with food. 30 tablet 1   ezetimibe  (ZETIA ) 10 MG tablet TAKE 1 TABLET BY MOUTH DAILY 90 tablet 3   finasteride  (PROSCAR ) 5 MG tablet Take 1.25 mg by mouth daily.     HYDROcodone -acetaminophen  (NORCO/VICODIN) 5-325 MG tablet Take 1 tablet by mouth every 6 (six) hours as needed for severe pain.     ibuprofen (ADVIL) 200 MG tablet Take 600 mg by mouth daily as needed for moderate pain (pain score 4-6).     lidocaine  (XYLOCAINE ) 2 % solution Patient: Mix 1part 2% viscous lidocaine , 1part H20. Swish & swallow 10mL of diluted mixture, 30min before meals and at bedtime, up to QID 200 mL 3   lidocaine -prilocaine  (EMLA ) cream Apply to affected area once 30 g 3   magic mouthwash (nystatin, lidocaine , diphenhydrAMINE , alum & mag  hydroxide) suspension Swish and swallow 5 mLs by mouth 4 (four) times daily as needed for mouth pain. Suspension contains equal amounts of Maalox Extra Strength, nystatin, diphenhydramine  and lidocaine . Swish and Swallow 140 mL 2   metaxalone (SKELAXIN) 800 MG tablet Take 400 mg by mouth 3 (three) times daily as needed for muscle spasms.     morphine  (MS CONTIN ) 15 MG 12 hr tablet Take 1 tablet (15 mg total) by mouth every 12 (twelve) hours. 60 tablet 0   nitroGLYCERIN  (NITROSTAT ) 0.4 MG SL tablet Place 1 tablet (0.4 mg total) under the tongue every 5 (five) minutes as needed for chest pain. 90 tablet 3   ondansetron  (ZOFRAN ) 8 MG tablet Take 1 tablet (8 mg total) by mouth every 8 (eight) hours as needed for nausea or vomiting. Start on the third day after cisplatin. 30 tablet 1   oxyCODONE  10 MG TABS Take 1 tablet (10 mg total) by mouth every 6 (six) hours as needed. 120 tablet 0  pantoprazole  (PROTONIX ) 40 MG tablet Take 1 tablet (40 mg total) by mouth 2 (two) times daily. 180 tablet 3   polyethylene glycol (MIRALAX / GLYCOLAX) 17 g packet Take 17 g by mouth daily as needed for moderate constipation.     prochlorperazine  (COMPAZINE ) 10 MG tablet Take 1 tablet (10 mg total) by mouth every 6 (six) hours as needed (Nausea or vomiting). 30 tablet 1   rosuvastatin  (CRESTOR ) 40 MG tablet Take 1 tablet (40 mg total) by mouth daily. 90 tablet 3   Tetrahydrozoline HCl (REDNESS RELIEVER EYE DROPS OP) Place 1 drop into both eyes daily as needed (redness).     TRELEGY ELLIPTA 100-62.5-25 MCG/ACT AEPB Inhale 1 puff into the lungs daily.     No current facility-administered medications for this visit.     VITALS:   Blood pressure 90/60, pulse (!) 106, temperature (!) 97.2 F (36.2 C), temperature source Temporal, resp. rate 16, height 5' 9 (1.753 m), weight 172 lb (78 kg), SpO2 100%.  Wt Readings from Last 3 Encounters:  08/02/24 172 lb (78 kg)  08/01/24 171 lb 8 oz (77.8 kg)  07/26/24 172 lb 4.8 oz  (78.2 kg)    Body mass index is 25.4 kg/m.   Onc Performance Status - 08/02/24 0932       ECOG Perf Status   ECOG Perf Status Restricted in physically strenuous activity but ambulatory and able to carry out work of a light or sedentary nature, e.g., light house work, office work      KPS SCALE   KPS % SCORE Normal activity with effort, some s/s of disease             PHYSICAL EXAM:   Physical Exam Constitutional:      General: He is not in acute distress.    Appearance: Normal appearance.  HENT:     Head: Normocephalic and atraumatic.     Mouth/Throat:     Comments: Signs of mild mucositis with erythema in the back of the throat.  No definite thrush. Eyes:     Conjunctiva/sclera: Conjunctivae normal.  Neck:     Comments:  2 cm, relatively firm mass at the angle of left jaw Cardiovascular:     Rate and Rhythm: Normal rate and regular rhythm.  Pulmonary:     Effort: Pulmonary effort is normal. No respiratory distress.  Chest:     Comments: Right-sided Port-A-Cath in place without any signs of infection Abdominal:     General: There is no distension.     Comments: Feeding tube in place  Neurological:     General: No focal deficit present.     Mental Status: He is alert and oriented to person, place, and time.  Psychiatric:        Mood and Affect: Mood normal.        Behavior: Behavior normal.       LABORATORY DATA:   I have reviewed the data as listed.  Results for orders placed or performed in visit on 08/02/24  Magnesium   Result Value Ref Range   Magnesium  1.2 (L) 1.7 - 2.4 mg/dL  Basic Metabolic Panel - Cancer Center Only  Result Value Ref Range   Sodium 129 (L) 135 - 145 mmol/L   Potassium 3.1 (L) 3.5 - 5.1 mmol/L   Chloride 95 (L) 98 - 111 mmol/L   CO2 27 22 - 32 mmol/L   Glucose, Bld 144 (H) 70 - 99 mg/dL   BUN 21 8 - 23  mg/dL   Creatinine 8.88 9.38 - 1.24 mg/dL   Calcium  8.4 (L) 8.9 - 10.3 mg/dL   GFR, Estimated >39 >39 mL/min   Anion gap 7  5 - 15  CBC with Differential (Cancer Center Only)  Result Value Ref Range   WBC Count 1.1 (L) 4.0 - 10.5 K/uL   RBC 3.37 (L) 4.22 - 5.81 MIL/uL   Hemoglobin 10.6 (L) 13.0 - 17.0 g/dL   HCT 70.6 (L) 60.9 - 47.9 %   MCV 86.9 80.0 - 100.0 fL   MCH 31.5 26.0 - 34.0 pg   MCHC 36.2 (H) 30.0 - 36.0 g/dL   RDW 87.2 88.4 - 84.4 %   Platelet Count 52 (L) 150 - 400 K/uL   nRBC 0.0 0.0 - 0.2 %   Neutrophils Relative % 78 %   Neutro Abs 0.9 (L) 1.7 - 7.7 K/uL   Lymphocytes Relative 12 %   Lymphs Abs 0.1 (L) 0.7 - 4.0 K/uL   Monocytes Relative 7 %   Monocytes Absolute 0.1 0.1 - 1.0 K/uL   Eosinophils Relative 0 %   Eosinophils Absolute 0.0 0.0 - 0.5 K/uL   Basophils Relative 1 %   Basophils Absolute 0.0 0.0 - 0.1 K/uL   WBC Morphology See Note    Smear Review See Note    Immature Granulocytes 2 %   Abs Immature Granulocytes 0.02 0.00 - 0.07 K/uL   Ovalocytes PRESENT   Results for orders placed or performed in visit on 08/02/24  Rad Onc Aria Session Summary  Result Value Ref Range   Course ID [id number]    Course Start Date 06/19/2024    Session Number 27    Course First Treatment Date 06/27/2024  2:31 PM    Course Last Treatment Date 08/02/2024  8:30 AM    Course Elapsed Days 36    Reference Point ID HN_Oroph dp    Reference Point Dosage Given to Date 54 Gy   Reference Point Session Dosage Given 2 Gy   Plan ID HN_Oroph    Plan Name HN_Oroph    Plan Fractions Treated to Date 27    Plan Total Fractions Prescribed 35    Plan Prescribed Dose Per Fraction 2 Gy   Plan Total Prescribed Dose 70.000000 Gy   Plan Primary Reference Point HN_Oroph dp       RADIOGRAPHIC STUDIES:  No recent pertinent imaging available to review.   CODE STATUS:  Code Status History     Date Active Date Inactive Code Status Order ID Comments User Context   06/23/2024 1257 06/24/2024 0510 Full Code [id number]  Hughes Simmonds, MD HOV   06/23/2024 1257 06/23/2024 1257 Full Code [id number]  Hughes Simmonds, MD HOV    04/29/2023 1830 04/30/2023 1811 Full Code [id number]  Renda Glance, MD Inpatient   06/05/2022 0846 06/05/2022 1634 Full Code [id number]  Claudene Victory ORN, MD Inpatient    Questions for Most Recent Historical Code Status (Order [id number])     Question Answer   By: Consent: discussion documented in EHR            No orders of the defined types were placed in this encounter.    Future Appointments  Date Time Provider Department Center  08/03/2024 10:00 AM [facility] LINAC 3 [facility] None  08/04/2024 10:00 AM [facility] LINAC 3 [facility] None  08/07/2024 10:00 AM [facility] LINAC 3 [facility] None  08/07/2024 10:15 AM LINAC-SQUIRE [facility] None  08/08/2024 10:00 AM [facility] LINAC 3 [facility] None  08/09/2024  7:30 AM [facility] FLUSH [facility] None  08/09/2024  8:00 AM [facility] INFUSION [facility] None  08/09/2024  8:45 AM Coni Homesley, MD [facility] None  08/09/2024 10:00 AM [facility] LINAC 3 [facility] None  08/09/2024 10:30 AM Ivonne Harlene RAMAN, RD [facility] None  08/10/2024 10:00 AM [facility] LINAC 3 [facility] None  08/11/2024 10:00 AM [facility] LINAC 3 [facility] None  08/14/2024 10:00 AM [facility] LINAC 3 [facility] None  09/06/2024 10:00 AM Breedlove Blue, Blaire L, PT [facility] None  09/06/2024 11:00 AM Jacelyn Lupita NOVAK, CCC-SLP [facility]  12/22/2024  3:30 PM [facility] 1 [facility] [place]     This document was completed utilizing speech recognition software. Grammatical errors, random word insertions, pronoun errors, and incomplete sentences are an occasional consequence of this system due to software limitations, ambient noise, and hardware issues. Any formal questions or concerns about the content, text or information contained within the body of this dictation should be directly addressed to the provider for clarification.

## 2024-08-02 NOTE — Assessment & Plan Note (Addendum)
 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   CT scan from July 2025 showed lymph nodes with extranodal extension, indicating a higher risk of recurrence without appropriate treatment. The cancer is aggressive due to lymph node involvement but has not spread beyond the head and neck area.   cT2,cN1,cM0,p16+, Stage 1 disease.  Concern for extranodal extension based on imaging.  Previously I discussed staging, prognosis, plan of care, treatment options.  Reviewed NCCN guidelines.  We have discussed about role of cisplatin being a radiosensitizer in the treatment of head and neck cancer.  We have discussed about the curative intent of chemoradiation for this patient.  Patient is willing to proceed with weekly cisplatin.  Emphasized the importance of hydration to protect kidney function. The treatment plan includes seven weeks of radiation and weekly chemotherapy, with the possibility of holding chemotherapy towards the end if side effects become problematic. The role of chemotherapy is supportive, making radiation more effective.   Last week he received Zarxio for 2 days for leukopenia.  However labs today reveal persistent leukopenia with white count of 1100, ANC of 900.  Platelet count also low at 52,000.  Will hold chemotherapy this week and proceed with Zarxio for 3 days starting today.  We will arrange for IV fluids on Monday, Wednesday, Friday each week, going forward to maintain his creatinine at a safe level.   He is to continue radiation treatments as scheduled.    We again emphasized the importance of hydration with him.  He has been making a good effort and maintaining hydration.  - Monitor kidney function and electrolytes weekly during treatment - Previously referred to physical therapy, speech language pathology, and nutritionist for supportive care  RTC in 1 week for labs, follow-up and  possible resumption of chemotherapy.  That would be the last of the planned chemotherapy.

## 2024-08-03 ENCOUNTER — Ambulatory Visit
Admission: RE | Admit: 2024-08-03 | Discharge: 2024-08-03 | Disposition: A | Discharge status: Home / Self Care | Source: Ambulatory Visit | Attending: Radiation Oncology | Admitting: Radiation Oncology

## 2024-08-03 ENCOUNTER — Inpatient Hospital Stay

## 2024-08-03 ENCOUNTER — Other Ambulatory Visit: Payer: Self-pay

## 2024-08-03 VITALS — BP 86/51 | HR 99 | Temp 97.9°F | Resp 17

## 2024-08-03 DIAGNOSIS — C109 Malignant neoplasm of oropharynx, unspecified: Secondary | ICD-10-CM

## 2024-08-03 LAB — RAD ONC ARIA SESSION SUMMARY
Course Elapsed Days: 37
Plan Fractions Treated to Date: 28
Plan Prescribed Dose Per Fraction: 2 Gy
Plan Total Fractions Prescribed: 35
Plan Total Prescribed Dose: 70 Gy
Reference Point Dosage Given to Date: 56 Gy
Reference Point Session Dosage Given: 2 Gy
Session Number: 28

## 2024-08-03 MED ORDER — FILGRASTIM-SNDZ 300 MCG/0.5ML IJ SOSY
300.0000 ug | PREFILLED_SYRINGE | Freq: Every day | INTRAMUSCULAR | Status: DC
  Administered 2024-08-03: 300 ug via SUBCUTANEOUS
  Filled 2024-08-03: qty 0.5

## 2024-08-04 ENCOUNTER — Ambulatory Visit
Admission: RE | Admit: 2024-08-04 | Discharge: 2024-08-04 | Disposition: A | Discharge status: Home / Self Care | Source: Ambulatory Visit | Attending: Radiation Oncology | Admitting: Radiation Oncology

## 2024-08-04 ENCOUNTER — Inpatient Hospital Stay

## 2024-08-04 ENCOUNTER — Other Ambulatory Visit: Payer: Self-pay

## 2024-08-04 VITALS — BP 101/51 | HR 89 | Temp 98.4°F | Resp 17

## 2024-08-04 DIAGNOSIS — C109 Malignant neoplasm of oropharynx, unspecified: Secondary | ICD-10-CM

## 2024-08-04 LAB — RAD ONC ARIA SESSION SUMMARY
Course Elapsed Days: 38
Plan Fractions Treated to Date: 29
Plan Prescribed Dose Per Fraction: 2 Gy
Plan Total Fractions Prescribed: 35
Plan Total Prescribed Dose: 70 Gy
Reference Point Dosage Given to Date: 58 Gy
Reference Point Session Dosage Given: 2 Gy
Session Number: 29

## 2024-08-04 MED ORDER — FILGRASTIM-SNDZ 300 MCG/0.5ML IJ SOSY
300.0000 ug | PREFILLED_SYRINGE | Freq: Every day | INTRAMUSCULAR | Status: DC
  Administered 2024-08-04: 300 ug via SUBCUTANEOUS

## 2024-08-04 MED ORDER — SODIUM CHLORIDE 0.9 % IV SOLN: Freq: Once | INTRAVENOUS | Status: AC

## 2024-08-04 MED ORDER — FILGRASTIM-SNDZ 300 MCG/0.5ML IJ SOSY
300.0000 ug | PREFILLED_SYRINGE | Freq: Every day | INTRAMUSCULAR | Status: DC
  Filled 2024-08-04: qty 0.5

## 2024-08-04 NOTE — Patient Instructions (Signed)

## 2024-08-05 ENCOUNTER — Inpatient Hospital Stay

## 2024-08-05 VITALS — BP 113/55 | HR 102 | Temp 97.5°F | Resp 18

## 2024-08-05 DIAGNOSIS — C109 Malignant neoplasm of oropharynx, unspecified: Secondary | ICD-10-CM

## 2024-08-05 MED ORDER — FILGRASTIM-SNDZ 300 MCG/0.5ML IJ SOSY
300.0000 ug | PREFILLED_SYRINGE | Freq: Once | INTRAMUSCULAR | Status: AC
  Administered 2024-08-05: 300 ug via SUBCUTANEOUS
  Filled 2024-08-05: qty 0.5

## 2024-08-07 ENCOUNTER — Inpatient Hospital Stay (HOSPITAL_BASED_OUTPATIENT_CLINIC_OR_DEPARTMENT_OTHER): Admitting: Physician Assistant

## 2024-08-07 ENCOUNTER — Inpatient Hospital Stay (HOSPITAL_COMMUNITY)

## 2024-08-07 ENCOUNTER — Emergency Department (HOSPITAL_COMMUNITY)

## 2024-08-07 ENCOUNTER — Ambulatory Visit

## 2024-08-07 ENCOUNTER — Inpatient Hospital Stay

## 2024-08-07 ENCOUNTER — Other Ambulatory Visit: Payer: Self-pay

## 2024-08-07 ENCOUNTER — Encounter (HOSPITAL_COMMUNITY): Payer: Self-pay | Admitting: Emergency Medicine

## 2024-08-07 ENCOUNTER — Other Ambulatory Visit (HOSPITAL_COMMUNITY): Payer: Self-pay

## 2024-08-07 ENCOUNTER — Other Ambulatory Visit: Payer: Self-pay | Admitting: Oncology

## 2024-08-07 ENCOUNTER — Inpatient Hospital Stay (HOSPITAL_COMMUNITY)
Admission: EM | Admit: 2024-08-07 | Discharge: 2024-08-12 | DRG: 871 | Disposition: A | Discharge status: Home Health | Source: Ambulatory Visit | Attending: Internal Medicine | Admitting: Internal Medicine

## 2024-08-07 ENCOUNTER — Encounter: Payer: Self-pay | Admitting: Oncology

## 2024-08-07 VITALS — BP 78/48 | HR 100 | Temp 98.5°F | Resp 19

## 2024-08-07 VITALS — BP 78/48 | HR 100 | Resp 19

## 2024-08-07 DIAGNOSIS — R6520 Severe sepsis without septic shock: Secondary | ICD-10-CM | POA: Diagnosis not present

## 2024-08-07 DIAGNOSIS — E876 Hypokalemia: Secondary | ICD-10-CM | POA: Diagnosis present

## 2024-08-07 DIAGNOSIS — C109 Malignant neoplasm of oropharynx, unspecified: Secondary | ICD-10-CM

## 2024-08-07 DIAGNOSIS — J188 Other pneumonia, unspecified organism: Secondary | ICD-10-CM | POA: Diagnosis not present

## 2024-08-07 DIAGNOSIS — Z8546 Personal history of malignant neoplasm of prostate: Secondary | ICD-10-CM

## 2024-08-07 DIAGNOSIS — R509 Fever, unspecified: Secondary | ICD-10-CM

## 2024-08-07 DIAGNOSIS — I959 Hypotension, unspecified: Secondary | ICD-10-CM

## 2024-08-07 DIAGNOSIS — D709 Neutropenia, unspecified: Secondary | ICD-10-CM | POA: Diagnosis not present

## 2024-08-07 DIAGNOSIS — D701 Agranulocytosis secondary to cancer chemotherapy: Secondary | ICD-10-CM | POA: Diagnosis not present

## 2024-08-07 DIAGNOSIS — Z806 Family history of leukemia: Secondary | ICD-10-CM

## 2024-08-07 DIAGNOSIS — I2510 Atherosclerotic heart disease of native coronary artery without angina pectoris: Secondary | ICD-10-CM | POA: Diagnosis not present

## 2024-08-07 DIAGNOSIS — N400 Enlarged prostate without lower urinary tract symptoms: Secondary | ICD-10-CM | POA: Diagnosis not present

## 2024-08-07 DIAGNOSIS — E785 Hyperlipidemia, unspecified: Secondary | ICD-10-CM | POA: Diagnosis present

## 2024-08-07 DIAGNOSIS — Z931 Gastrostomy status: Secondary | ICD-10-CM | POA: Diagnosis not present

## 2024-08-07 DIAGNOSIS — C760 Malignant neoplasm of head, face and neck: Secondary | ICD-10-CM | POA: Diagnosis not present

## 2024-08-07 DIAGNOSIS — R7401 Elevation of levels of liver transaminase levels: Secondary | ICD-10-CM | POA: Diagnosis present

## 2024-08-07 DIAGNOSIS — Z800 Family history of malignant neoplasm of digestive organs: Secondary | ICD-10-CM

## 2024-08-07 DIAGNOSIS — T451X5A Adverse effect of antineoplastic and immunosuppressive drugs, initial encounter: Secondary | ICD-10-CM | POA: Diagnosis not present

## 2024-08-07 DIAGNOSIS — E440 Moderate protein-calorie malnutrition: Secondary | ICD-10-CM | POA: Diagnosis not present

## 2024-08-07 DIAGNOSIS — E43 Unspecified severe protein-calorie malnutrition: Secondary | ICD-10-CM | POA: Diagnosis not present

## 2024-08-07 DIAGNOSIS — G893 Neoplasm related pain (acute) (chronic): Secondary | ICD-10-CM

## 2024-08-07 DIAGNOSIS — A419 Sepsis, unspecified organism: Secondary | ICD-10-CM | POA: Diagnosis not present

## 2024-08-07 DIAGNOSIS — Z8042 Family history of malignant neoplasm of prostate: Secondary | ICD-10-CM

## 2024-08-07 DIAGNOSIS — E8721 Acute metabolic acidosis: Secondary | ICD-10-CM | POA: Diagnosis present

## 2024-08-07 DIAGNOSIS — I4510 Unspecified right bundle-branch block: Secondary | ICD-10-CM | POA: Diagnosis present

## 2024-08-07 DIAGNOSIS — Z7982 Long term (current) use of aspirin: Secondary | ICD-10-CM

## 2024-08-07 DIAGNOSIS — J159 Unspecified bacterial pneumonia: Secondary | ICD-10-CM | POA: Diagnosis not present

## 2024-08-07 DIAGNOSIS — Z79899 Other long term (current) drug therapy: Secondary | ICD-10-CM

## 2024-08-07 DIAGNOSIS — I10 Essential (primary) hypertension: Secondary | ICD-10-CM | POA: Diagnosis present

## 2024-08-07 DIAGNOSIS — B370 Candidal stomatitis: Secondary | ICD-10-CM | POA: Diagnosis present

## 2024-08-07 DIAGNOSIS — K1230 Oral mucositis (ulcerative), unspecified: Secondary | ICD-10-CM | POA: Diagnosis present

## 2024-08-07 DIAGNOSIS — T378X5A Adverse effect of other specified systemic anti-infectives and antiparasitics, initial encounter: Secondary | ICD-10-CM | POA: Diagnosis present

## 2024-08-07 DIAGNOSIS — Z1152 Encounter for screening for COVID-19: Secondary | ICD-10-CM | POA: Diagnosis not present

## 2024-08-07 DIAGNOSIS — E871 Hypo-osmolality and hyponatremia: Secondary | ICD-10-CM | POA: Diagnosis present

## 2024-08-07 DIAGNOSIS — Z9079 Acquired absence of other genital organ(s): Secondary | ICD-10-CM

## 2024-08-07 DIAGNOSIS — Z923 Personal history of irradiation: Secondary | ICD-10-CM

## 2024-08-07 DIAGNOSIS — D649 Anemia, unspecified: Secondary | ICD-10-CM | POA: Diagnosis present

## 2024-08-07 DIAGNOSIS — K219 Gastro-esophageal reflux disease without esophagitis: Secondary | ICD-10-CM | POA: Diagnosis present

## 2024-08-07 DIAGNOSIS — Z8249 Family history of ischemic heart disease and other diseases of the circulatory system: Secondary | ICD-10-CM | POA: Diagnosis not present

## 2024-08-07 DIAGNOSIS — R0902 Hypoxemia: Secondary | ICD-10-CM | POA: Diagnosis present

## 2024-08-07 DIAGNOSIS — D61810 Antineoplastic chemotherapy induced pancytopenia: Secondary | ICD-10-CM | POA: Diagnosis present

## 2024-08-07 DIAGNOSIS — R000 Tachycardia, unspecified: Secondary | ICD-10-CM | POA: Diagnosis present

## 2024-08-07 DIAGNOSIS — R5081 Fever presenting with conditions classified elsewhere: Secondary | ICD-10-CM | POA: Diagnosis not present

## 2024-08-07 DIAGNOSIS — C090 Malignant neoplasm of tonsillar fossa: Secondary | ICD-10-CM | POA: Diagnosis not present

## 2024-08-07 DIAGNOSIS — Z510 Encounter for antineoplastic radiation therapy: Secondary | ICD-10-CM | POA: Diagnosis not present

## 2024-08-07 DIAGNOSIS — Z6826 Body mass index (BMI) 26.0-26.9, adult: Secondary | ICD-10-CM

## 2024-08-07 DIAGNOSIS — D61818 Other pancytopenia: Secondary | ICD-10-CM | POA: Diagnosis present

## 2024-08-07 DIAGNOSIS — Z803 Family history of malignant neoplasm of breast: Secondary | ICD-10-CM

## 2024-08-07 LAB — BASIC METABOLIC PANEL WITH GFR
Anion gap: 14 (ref 5–15)
BUN: 9 mg/dL (ref 8–23)
CO2: 14 mmol/L — ABNORMAL LOW (ref 22–32)
Calcium: 6.7 mg/dL — ABNORMAL LOW (ref 8.9–10.3)
Chloride: 98 mmol/L (ref 98–111)
Creatinine, Ser: 0.48 mg/dL — ABNORMAL LOW (ref 0.61–1.24)
GFR, Estimated: >60 mL/min (ref >60)
Glucose, Bld: 81 mg/dL (ref 70–99)
Potassium: 3.8 mmol/L (ref 3.5–5.1)
Sodium: 127 mmol/L — ABNORMAL LOW (ref 135–145)

## 2024-08-07 LAB — I-STAT CG4 LACTIC ACID, ED
Lactic Acid, Venous: 2.3 mmol/L (ref 0.5–1.9)
Lactic Acid, Venous: 4 mmol/L (ref 0.5–1.9)
Lactic Acid, Venous: 7.2 mmol/L (ref 0.5–1.9)

## 2024-08-07 LAB — CBC WITH DIFFERENTIAL/PLATELET
Abs Immature Granulocytes: 0.01 K/uL (ref 0.00–0.07)
Basophils Absolute: 0 K/uL (ref 0.0–0.1)
Basophils Relative: 1 %
Eosinophils Absolute: 0 K/uL (ref 0.0–0.5)
Eosinophils Relative: 0 %
HCT: 23.6 % — ABNORMAL LOW (ref 39.0–52.0)
Hemoglobin: 8.4 g/dL — ABNORMAL LOW (ref 13.0–17.0)
Immature Granulocytes: 1 %
Lymphocytes Relative: 49 %
Lymphs Abs: 0.5 K/uL — ABNORMAL LOW (ref 0.7–4.0)
MCH: 31.8 pg (ref 26.0–34.0)
MCHC: 35.6 g/dL (ref 30.0–36.0)
MCV: 89.4 fL (ref 80.0–100.0)
Monocytes Absolute: 0.1 K/uL (ref 0.1–1.0)
Monocytes Relative: 7 %
Neutro Abs: 0.4 K/uL — CL (ref 1.7–7.7)
Neutrophils Relative %: 42 %
Platelets: 41 K/uL — ABNORMAL LOW (ref 150–400)
RBC: 2.64 MIL/uL — ABNORMAL LOW (ref 4.22–5.81)
RDW: 13 % (ref 11.5–15.5)
Smear Review: NORMAL
WBC: 0.9 K/uL — CL (ref 4.0–10.5)
nRBC: 0 % (ref 0.0–0.2)

## 2024-08-07 LAB — URINALYSIS, W/ REFLEX TO CULTURE (INFECTION SUSPECTED)
Bilirubin Urine: NEGATIVE
Glucose, UA: 50 mg/dL — AB
Hgb urine dipstick: NEGATIVE
Ketones, ur: NEGATIVE mg/dL
Leukocytes,Ua: NEGATIVE
Nitrite: NEGATIVE
Protein, ur: 100 mg/dL — AB
Specific Gravity, Urine: 1.028 (ref 1.005–1.030)
pH: 6 (ref 5.0–8.0)

## 2024-08-07 LAB — MRSA NEXT GEN BY PCR, NASAL: MRSA by PCR Next Gen: NOT DETECTED

## 2024-08-07 LAB — LACTIC ACID, PLASMA
Lactic Acid, Venous: 6.4 mmol/L (ref 0.5–1.9)
Lactic Acid, Venous: 1.3 mmol/L (ref 0.5–1.9)

## 2024-08-07 LAB — COMPREHENSIVE METABOLIC PANEL WITH GFR
ALT: 77 U/L — ABNORMAL HIGH (ref 0–44)
AST: 72 U/L — ABNORMAL HIGH (ref 15–41)
Albumin: 2.9 g/dL — ABNORMAL LOW (ref 3.5–5.0)
Alkaline Phosphatase: 64 U/L (ref 38–126)
Anion gap: 9 (ref 5–15)
BUN: 15 mg/dL (ref 8–23)
CO2: 25 mmol/L (ref 22–32)
Calcium: 8.6 mg/dL — ABNORMAL LOW (ref 8.9–10.3)
Chloride: 90 mmol/L — ABNORMAL LOW (ref 98–111)
Creatinine, Ser: 1.21 mg/dL (ref 0.61–1.24)
GFR, Estimated: >60 mL/min (ref >60)
Glucose, Bld: 147 mg/dL — ABNORMAL HIGH (ref 70–99)
Potassium: 4.2 mmol/L (ref 3.5–5.1)
Sodium: 124 mmol/L — ABNORMAL LOW (ref 135–145)
Total Bilirubin: 0.6 mg/dL (ref 0.0–1.2)
Total Protein: 6.2 g/dL — ABNORMAL LOW (ref 6.5–8.1)

## 2024-08-07 LAB — PROTIME-INR
INR: 1.1 (ref 0.8–1.2)
Prothrombin Time: 15 s (ref 11.4–15.2)

## 2024-08-07 LAB — RESP PANEL BY RT-PCR (RSV, FLU A&B, COVID)  RVPGX2
Influenza A by PCR: NEGATIVE
Influenza B by PCR: NEGATIVE
Resp Syncytial Virus by PCR: NEGATIVE
SARS Coronavirus 2 by RT PCR: NEGATIVE

## 2024-08-07 LAB — MAGNESIUM: Magnesium: 0.7 mg/dL — CL (ref 1.7–2.4)

## 2024-08-07 LAB — PHOSPHORUS: Phosphorus: <1 mg/dL — CL (ref 2.5–4.6)

## 2024-08-07 LAB — HEMOGLOBIN AND HEMATOCRIT, BLOOD
HCT: 19.2 % — ABNORMAL LOW (ref 39.0–52.0)
Hemoglobin: 6.9 g/dL — CL (ref 13.0–17.0)

## 2024-08-07 LAB — PROCALCITONIN: Procalcitonin: 4.47 ng/mL

## 2024-08-07 LAB — SODIUM, URINE, RANDOM: Sodium, Ur: <30 mmol/L

## 2024-08-07 MED ORDER — MORPHINE SULFATE ER 30 MG PO TBCR
30.0000 mg | EXTENDED_RELEASE_TABLET | Freq: Two times a day (BID) | ORAL | 0 refills | Status: DC
  Filled 2024-08-07: qty 60, 30d supply, fill #0

## 2024-08-07 MED ORDER — NYSTATIN 100000 UNIT/ML MT SUSP
5.0000 mL | Freq: Four times a day (QID) | OROMUCOSAL | Status: DC
  Administered 2024-08-07 – 2024-08-10 (×10): 500000 [IU] via ORAL
  Filled 2024-08-07 (×17): qty 5

## 2024-08-07 MED ORDER — PANTOPRAZOLE SODIUM 40 MG IV SOLR
40.0000 mg | Freq: Two times a day (BID) | INTRAVENOUS | Status: DC
  Administered 2024-08-07 – 2024-08-12 (×10): 40 mg via INTRAVENOUS
  Filled 2024-08-07 (×10): qty 10

## 2024-08-07 MED ORDER — HYDROMORPHONE HCL 1 MG/ML IJ SOLN
1.0000 mg | Freq: Once | INTRAMUSCULAR | Status: AC
  Administered 2024-08-07: 1 mg via INTRAVENOUS
  Filled 2024-08-07: qty 1

## 2024-08-07 MED ORDER — MORPHINE SULFATE ER 15 MG PO TBCR
30.0000 mg | EXTENDED_RELEASE_TABLET | Freq: Two times a day (BID) | ORAL | Status: DC
Start: 2024-08-07 — End: 2024-08-09
  Filled 2024-08-07 (×3): qty 2

## 2024-08-07 MED ORDER — EZETIMIBE 10 MG PO TABS
10.0000 mg | ORAL_TABLET | Freq: Every day | ORAL | Status: DC
  Administered 2024-08-08: 10 mg via ORAL
  Filled 2024-08-07: qty 1

## 2024-08-07 MED ORDER — FILGRASTIM-AAFI 300 MCG/0.5ML IJ SOSY
300.0000 ug | PREFILLED_SYRINGE | Freq: Every day | INTRAMUSCULAR | Status: DC
  Administered 2024-08-08 – 2024-08-09 (×2): 300 ug via SUBCUTANEOUS
  Filled 2024-08-07 (×2): qty 0.5

## 2024-08-07 MED ORDER — VANCOMYCIN HCL IN DEXTROSE 1-5 GM/200ML-% IV SOLN
1000.0000 mg | Freq: Once | INTRAVENOUS | Status: AC
  Administered 2024-08-07: 1000 mg via INTRAVENOUS
  Filled 2024-08-07: qty 200

## 2024-08-07 MED ORDER — ALPRAZOLAM 0.5 MG PO TABS: 1.0000 mg | ORAL_TABLET | Freq: Every day | ORAL | Status: DC | PRN

## 2024-08-07 MED ORDER — LEVALBUTEROL HCL 0.63 MG/3ML IN NEBU
0.6300 mg | INHALATION_SOLUTION | Freq: Three times a day (TID) | RESPIRATORY_TRACT | Status: DC
  Administered 2024-08-07 – 2024-08-11 (×10): 0.63 mg via RESPIRATORY_TRACT
  Filled 2024-08-07 (×12): qty 3

## 2024-08-07 MED ORDER — SODIUM CHLORIDE 0.9 % IV SOLN
2.0000 g | Freq: Once | INTRAVENOUS | Status: AC
  Administered 2024-08-07: 2 g via INTRAVENOUS
  Filled 2024-08-07: qty 12.5

## 2024-08-07 MED ORDER — SODIUM CHLORIDE 0.9 % IV SOLN
500.0000 mg | INTRAVENOUS | Status: AC
  Administered 2024-08-07 – 2024-08-11 (×5): 500 mg via INTRAVENOUS
  Filled 2024-08-07 (×5): qty 5

## 2024-08-07 MED ORDER — IOHEXOL 350 MG/ML SOLN
75.0000 mL | Freq: Once | INTRAVENOUS | Status: AC | PRN
  Administered 2024-08-07: 75 mL via INTRAVENOUS

## 2024-08-07 MED ORDER — ENSURE PLUS HIGH PROTEIN PO LIQD: 237.0000 mL | Freq: Three times a day (TID) | ORAL | Status: DC

## 2024-08-07 MED ORDER — HYDROMORPHONE HCL 1 MG/ML IJ SOLN: 0.7500 mg | INTRAMUSCULAR | Status: DC | PRN

## 2024-08-07 MED ORDER — CHLORHEXIDINE GLUCONATE CLOTH 2 % EX PADS
6.0000 | MEDICATED_PAD | Freq: Every day | CUTANEOUS | Status: DC
  Administered 2024-08-07 – 2024-08-12 (×5): 6 via TOPICAL

## 2024-08-07 MED ORDER — OXYCODONE HCL 5 MG PO TABS
5.0000 mg | ORAL_TABLET | ORAL | Status: DC | PRN
Start: 2024-08-07 — End: 2024-08-09

## 2024-08-07 MED ORDER — ONDANSETRON HCL 4 MG PO TABS: 4.0000 mg | ORAL_TABLET | Freq: Four times a day (QID) | ORAL | Status: DC | PRN

## 2024-08-07 MED ORDER — ACETAMINOPHEN 650 MG RE SUPP
650.0000 mg | Freq: Four times a day (QID) | RECTAL | Status: DC | PRN
Start: 2024-08-07 — End: 2024-08-12

## 2024-08-07 MED ORDER — LACTATED RINGERS IV BOLUS
1000.0000 mL | Freq: Once | INTRAVENOUS | Status: AC
  Administered 2024-08-07: 1000 mL via INTRAVENOUS

## 2024-08-07 MED ORDER — MAGIC MOUTHWASH W/LIDOCAINE
5.0000 mL | Freq: Four times a day (QID) | ORAL | Status: DC | PRN
  Administered 2024-08-07 – 2024-08-11 (×15): 5 mL via ORAL
  Filled 2024-08-07 (×22): qty 5

## 2024-08-07 MED ORDER — MAGNESIUM SULFATE 2 GM/50ML IV SOLN
2.0000 g | Freq: Once | INTRAVENOUS | Status: AC
  Administered 2024-08-07: 2 g via INTRAVENOUS
  Filled 2024-08-07: qty 50

## 2024-08-07 MED ORDER — ASPIRIN 81 MG PO TBEC: 81.0000 mg | DELAYED_RELEASE_TABLET | Freq: Every day | ORAL | Status: DC

## 2024-08-07 MED ORDER — ACETAMINOPHEN 325 MG PO TABS
650.0000 mg | ORAL_TABLET | Freq: Four times a day (QID) | ORAL | Status: DC | PRN
  Administered 2024-08-09 – 2024-08-12 (×3): 650 mg via ORAL
  Filled 2024-08-07 (×3): qty 2

## 2024-08-07 MED ORDER — ONDANSETRON HCL 4 MG/2ML IJ SOLN
8.0000 mg | Freq: Once | INTRAMUSCULAR | Status: AC
  Administered 2024-08-07: 8 mg via INTRAVENOUS
  Filled 2024-08-07: qty 4

## 2024-08-07 MED ORDER — HYDROMORPHONE HCL 1 MG/ML IJ SOLN
1.0000 mg | INTRAMUSCULAR | Status: AC | PRN
  Administered 2024-08-07 – 2024-08-08 (×3): 1 mg via INTRAVENOUS
  Filled 2024-08-07 (×3): qty 1

## 2024-08-07 MED ORDER — ONDANSETRON HCL 4 MG/2ML IJ SOLN
4.0000 mg | Freq: Once | INTRAMUSCULAR | Status: AC
  Administered 2024-08-07: 4 mg via INTRAVENOUS
  Filled 2024-08-07: qty 2

## 2024-08-07 MED ORDER — ORAL CARE MOUTH RINSE
15.0000 mL | OROMUCOSAL | Status: DC
  Administered 2024-08-08 – 2024-08-12 (×18): 15 mL via OROMUCOSAL

## 2024-08-07 MED ORDER — SODIUM CHLORIDE 0.9 % IV SOLN: Freq: Once | INTRAVENOUS | Status: AC

## 2024-08-07 MED ORDER — NYSTATIN 100000 UNIT/ML MT SUSP: 5.0000 mL | Freq: Four times a day (QID) | OROMUCOSAL | Status: DC | PRN

## 2024-08-07 MED ORDER — POTASSIUM PHOSPHATES 15 MMOLE/5ML IV SOLN
30.0000 mmol | Freq: Once | INTRAVENOUS | Status: AC
  Administered 2024-08-07: 30 mmol via INTRAVENOUS
  Filled 2024-08-07: qty 10

## 2024-08-07 MED ORDER — LACTATED RINGERS IV BOLUS (SEPSIS)
1000.0000 mL | Freq: Once | INTRAVENOUS | Status: AC
  Administered 2024-08-07: 1000 mL via INTRAVENOUS

## 2024-08-07 MED ORDER — LACTATED RINGERS IV BOLUS (SEPSIS)
500.0000 mL | Freq: Once | INTRAVENOUS | Status: AC
  Administered 2024-08-07: 500 mL via INTRAVENOUS

## 2024-08-07 MED ORDER — LACTATED RINGERS IV SOLN: INTRAVENOUS | Status: AC

## 2024-08-07 MED ORDER — SODIUM CHLORIDE 0.9% IV SOLUTION: Freq: Once | INTRAVENOUS | Status: AC

## 2024-08-07 MED ORDER — BUDESON-GLYCOPYRROL-FORMOTEROL 160-9-4.8 MCG/ACT IN AERO
2.0000 | INHALATION_SPRAY | Freq: Two times a day (BID) | RESPIRATORY_TRACT | Status: DC
  Administered 2024-08-08 – 2024-08-12 (×7): 2 via RESPIRATORY_TRACT
  Filled 2024-08-07: qty 5.9

## 2024-08-07 MED ORDER — NITROGLYCERIN 0.4 MG SL SUBL
0.4000 mg | SUBLINGUAL_TABLET | SUBLINGUAL | Status: DC | PRN
Start: 2024-08-07 — End: 2024-08-12

## 2024-08-07 MED ORDER — SODIUM CHLORIDE 0.9 % IV SOLN
2.0000 g | Freq: Two times a day (BID) | INTRAVENOUS | Status: DC
  Administered 2024-08-07 – 2024-08-10 (×6): 2 g via INTRAVENOUS
  Filled 2024-08-07 (×6): qty 12.5

## 2024-08-07 MED ORDER — PANTOPRAZOLE SODIUM 40 MG PO TBEC: 40.0000 mg | DELAYED_RELEASE_TABLET | Freq: Two times a day (BID) | ORAL | Status: DC

## 2024-08-07 MED ORDER — ALLOPURINOL 100 MG PO TABS
300.0000 mg | ORAL_TABLET | Freq: Every day | ORAL | Status: DC
  Administered 2024-08-08: 300 mg via ORAL
  Filled 2024-08-07: qty 3

## 2024-08-07 MED ORDER — FINASTERIDE 5 MG PO TABS
5.0000 mg | ORAL_TABLET | Freq: Every day | ORAL | Status: DC
  Administered 2024-08-07 – 2024-08-12 (×6): 5 mg via ORAL
  Filled 2024-08-07 (×6): qty 1

## 2024-08-07 MED ORDER — METRONIDAZOLE 500 MG/100ML IV SOLN
500.0000 mg | Freq: Once | INTRAVENOUS | Status: AC
  Administered 2024-08-07: 500 mg via INTRAVENOUS
  Filled 2024-08-07: qty 100

## 2024-08-07 MED ORDER — ORAL CARE MOUTH RINSE: 15.0000 mL | OROMUCOSAL | Status: DC | PRN

## 2024-08-07 MED ORDER — ONDANSETRON HCL 4 MG/2ML IJ SOLN: 4.0000 mg | Freq: Four times a day (QID) | INTRAMUSCULAR | Status: DC | PRN

## 2024-08-07 MED ORDER — HYDROMORPHONE HCL 1 MG/ML IJ SOLN: 0.7500 mg | Freq: Once | INTRAMUSCULAR | Status: DC

## 2024-08-07 MED ORDER — METHYLPREDNISOLONE SODIUM SUCC 40 MG IJ SOLR
40.0000 mg | Freq: Once | INTRAMUSCULAR | Status: AC
  Administered 2024-08-07: 40 mg via INTRAVENOUS
  Filled 2024-08-07: qty 1

## 2024-08-07 MED ORDER — POLYETHYLENE GLYCOL 3350 17 G PO PACK
17.0000 g | PACK | Freq: Every day | ORAL | Status: DC | PRN
  Administered 2024-08-12: 17 g via ORAL
  Filled 2024-08-07: qty 1

## 2024-08-07 NOTE — Progress Notes (Signed)
 Symptom Management Consult Note  [facility]    Patient Care Team: Pietro Redell RAMAN, MD as PCP - Cardiology (Cardiology) Vertell Pont, RN as Oncology Nurse Navigator Renda Glance, MD as Consulting Physician (Urology) Patrcia Cough, MD as Consulting Physician (Radiation Oncology) Starla Wendelyn BIRCH, RN as Registered Nurse Izell Domino, MD as Consulting Physician (Radiation Oncology) Autumn Millman, MD as Consulting Physician (Oncology) Lauralee Chew, MD as Referring Physician (Otolaryngology) Malmfelt, Delon CROME, RN as Oncology Nurse Navigator    Name / MRN / DOBBETHA Razi Thompson  [id number]  04-Mar-1951   Date of visit: 08/07/2024   Chief Complaint/Reason for visit: hypotension   Current Therapy: Cisplatin with concurrent radiation  Last treatment:  Day 1   Cycle 5 on 07/26/24    ASSESSMENT AND PLAN Patient is a 73 y.o. 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 Autumn.  I have viewed most recent oncology note and lab work.  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  Per documentation patient received 3 days of G-CSF injection Zarzio to help counts recover (11/13-11/15) -Will hold radiation today as patient is unstable. - Next appointment with oncologist is 08/09/24  #Hypotension and hypoxia - Patient ill-appearing.  Hypotensive on arrival.  Received 1 L normal saline and still hypotensive.  Patient also noted to be tachycardic and hypoxic.  Does not have previous oxygen requirement. Patient has coarse lung sounds throughout.  Currently on 2 L nasal cannula with oxygen saturation of 97%.  Patient is unable to ambulate secondary to weakness which is also new.  He was in clinic last week receiving IV fluids and ambulated without difficulty. - Patient did not have labs collected  today as he was here for a fluid only appointment.  I became involved in his care near the completion of his IV fluids.  Reviewing his chart shows that labs x 5 days ago showed WBC 1.1, hemoglobin 10.6, platelets 52K with an ANC of 0.9. - Patient will need further evaluation in the emergency department, concern for sepsis vs infection, ?aspiration. - Patient did take some medications prior to arrival today, unsure if he took blood pressure medication.  He does admit to taking some food by mouth including ensures, his medications and things like ice cream.  Cannot recall an event over the weekend of possible aspiration.  Also uses his G-tube and had no issues with feeds this weekend.  Report given to accepting ED RN.  Will update oncologist as well.  HEME/ONC HISTORY Oncology History  Malignant neoplasm of prostate (HCC)  01/19/2023 Cancer Staging   Staging form: Prostate, AJCC 8th Edition - Clinical stage from 01/19/2023: Stage IIC (cT2a, cN0, cM0, PSA: 6, Grade Group: 4) - Signed by Sherwood Rise, PA-C on 03/30/2023 Histopathologic type: Adenocarcinoma, NOS Stage prefix: Initial diagnosis Prostate specific antigen (PSA) range: Less than 10 Gleason primary pattern: 4 Gleason secondary pattern: 4 Gleason score: 8 Histologic grading system: 5 grade system Number of biopsy cores examined: 14 Number of biopsy cores positive: 5 Location of positive needle core biopsies: One side   02/24/2023 Initial Diagnosis   Malignant neoplasm of prostate (HCC)   Primary squamous cell carcinoma of oropharynx (HCC)  06/07/2024 Initial Diagnosis   Malignant neoplasm of base of tongue (HCC)   06/07/2024 Cancer Staging   Staging form: Pharynx - HPV-Mediated Oropharynx, AJCC 8th Edition -  Clinical stage from 06/07/2024: Stage I (cT2, cN1, cM0, p16+) - Signed by Izell Domino, MD on 06/07/2024 Stage prefix: Initial diagnosis   06/28/2024 -  Chemotherapy   Patient is on Treatment Plan : HEAD/NECK Cisplatin (40) q7d          INTERVAL HISTORY  Discussed the use of AI scribe software for clinical note transcription with the patient, who gave verbal consent to proceed.    Gilbert Thompson is a 73 y.o. male with oncologic history as above presenting to [facility] today with chief complaint of hypotension.  Patient presents unaccompanied today.   He has been feeling unwell since Saturday of this weekend.  Reports feeling weak, dizzy and had a worsening productive cough.  He has had a cough ongoing through radiation events however is producing more phlegm now.  He did not have a fever this weekend. T max 99.3.  No falls.  He was unable to ambulate by himself which is new, never has had to use a cane or walker before.  Patient states he has been using his G-tube without difficulty.  Had 5 cartons yesterday.  He does still take food by mouth including medications and liquids such as ice cream or Ensure.  He has not had any vomiting.  He does not recall episode of aspiration over the weekend. He denies any sick contacts. No symptoms of UTI. Patient was here for scheduled IV fluid appointment today.  No labs were collected as this was supposed to be an infusion only visit.  Patient received 1 L of normal saline and remained hypotensive.    ROS  All other systems are reviewed and are negative for acute change except as noted in the HPI.    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, follwed by [facility]   Dyspnea    with exertion   GERD (gastroesophageal reflux disease)    Hyperlipidemia    Hypertension    Pneumonia      Past Surgical History:  Procedure Laterality Date   CATARACT EXTRACTION Bilateral    IR GASTROSTOMY TUBE MOD SED  06/23/2024   IR IMAGING GUIDED PORT INSERTION  06/23/2024   LEFT HEART CATH AND  CORONARY ANGIOGRAPHY N/A 06/05/2022   Procedure: LEFT HEART CATH AND CORONARY ANGIOGRAPHY;  Surgeon: Claudene Victory ORN, MD;  Location: [facility];  Service: Cardiovascular;  Laterality: N/A;   LYMPHADENECTOMY Bilateral 04/29/2023   Procedure: BILATERAL PELVIC LYMPHADENECTOMY;  Surgeon: Renda Glance, MD;  Location: [facility];  Service: Urology;  Laterality: Bilateral;   PROSTATE BIOPSY     ROBOT ASSISTED LAPAROSCOPIC RADICAL PROSTATECTOMY N/A 04/29/2023   Procedure: XI ROBOTIC ASSISTED LAPAROSCOPIC RADICAL PROSTATECTOMY LEVEL 2;  Surgeon: Renda Glance, MD;  Location: [facility];  Service: Urology;  Laterality: N/A;  210 MINUTES NEEDED FOR CASE   UPPER GASTROINTESTINAL ENDOSCOPY     VENTRAL HERNIA REPAIR N/A 10/05/2023   Procedure: LAPAROSCOPIC VENTRAL HERNIA REPAAIR WITH MESH;  Surgeon: Rubin Calamity, MD;  Location: [facility] OR;  Service: General;  Laterality: N/A;    Social History   Socioeconomic History   Marital status: Married    Spouse name: Not on file   Number of children: 1   Years of  education: Not on file   Highest education level: Not on file  Occupational History   Not on file  Tobacco Use   Smoking status: Never   Smokeless tobacco: Never  Vaping Use   Vaping status: Never Used  Substance and Sexual Activity   Alcohol use: Yes    Alcohol/week: 14.0 standard drinks of alcohol    Types: 14 Standard drinks or equivalent per week    Comment: 2 drinks per day   Drug use: Never   Sexual activity: Not on file  Other Topics Concern   Not on file  Social History Narrative   Not on file   Social Drivers of Health   Financial Resource Strain: Not on file  Food Insecurity: No Food Insecurity (06/07/2024)   Hunger Vital Sign    Worried About Running Out of Food in the Last Year: Never true    Ran Out of Food in the Last Year: Never true  Transportation Needs: No Transportation Needs (06/07/2024)   PRAPARE - Administrator, Civil Service (Medical): No    Lack  of Transportation (Non-Medical): No  Physical Activity: Not on file  Stress: Not on file  Social Connections: Not on file  Intimate Partner Violence: Not At Risk (06/07/2024)   Humiliation, Afraid, Rape, and Kick questionnaire    Fear of Current or Ex-Partner: No    Emotionally Abused: No    Physically Abused: No    Sexually Abused: No    Family History  Problem Relation Age of Onset   Coronary artery disease Father    Prostate cancer Father 81 - 2   Leukemia Father 47   Coronary artery disease Brother    Prostate cancer Brother 38 - 59   Colon cancer Brother 47   Pancreatic cancer Cousin        maternal first cousin   Breast cancer Daughter 19       negative genetic testing   Esophageal cancer Neg Hx    Rectal cancer Neg Hx    Stomach cancer Neg Hx      Current Outpatient Medications:    albuterol  (VENTOLIN  HFA) 108 (90 Base) MCG/ACT inhaler, Inhale 1-2 puffs into the lungs every 6 (six) hours as needed for wheezing or shortness of breath., Disp: , Rfl:    allopurinol  (ZYLOPRIM ) 300 MG tablet, Take 300 mg by mouth daily., Disp: , Rfl:    ALPRAZolam  (XANAX ) 1 MG tablet, Take 1 mg by mouth at bedtime as needed for anxiety or sleep., Disp: , Rfl:    aspirin  EC 81 MG tablet, Take 81 mg by mouth daily. Swallow whole., Disp: , Rfl:    celecoxib (CELEBREX) 200 MG capsule, , Disp: , Rfl:    cholecalciferol (VITAMIN D3) 25 MCG (1000 UNIT) tablet, Take 1,000 Units by mouth daily., Disp: , Rfl:    dexamethasone  (DECADRON ) 4 MG tablet, Take 2 tablets (8 mg) by mouth daily x 3 days starting the day after cisplatin chemotherapy. Take with food., Disp: 30 tablet, Rfl: 1   ezetimibe  (ZETIA ) 10 MG tablet, TAKE 1 TABLET BY MOUTH DAILY, Disp: 90 tablet, Rfl: 3   finasteride  (PROSCAR ) 5 MG tablet, Take 1.25 mg by mouth daily., Disp: , Rfl:    HYDROcodone -acetaminophen  (NORCO/VICODIN) 5-325 MG tablet, Take 1 tablet by mouth every 6 (six) hours as needed for severe pain., Disp: , Rfl:     ibuprofen (ADVIL) 200 MG tablet, Take 600 mg by mouth daily as needed for moderate pain (pain  score 4-6)., Disp: , Rfl:    lidocaine  (XYLOCAINE ) 2 % solution, Patient: Mix 1part 2% viscous lidocaine , 1part H20. Swish & swallow 10mL of diluted mixture, before meals and at bedtime, up to QID, Disp: 200 mL, Rfl: 3   lidocaine -prilocaine  (EMLA ) cream, Apply to affected area once, Disp: 30 g, Rfl: 3   magic mouthwash (nystatin, lidocaine , diphenhydrAMINE , alum & mag hydroxide) suspension, Swish and swallow 5 mLs by mouth 4 (four) times daily as needed for mouth pain. Suspension contains equal amounts of Maalox Extra Strength, nystatin, diphenhydramine  and lidocaine . Swish and Swallow, Disp: 140 mL, Rfl: 2   metaxalone (SKELAXIN) 800 MG tablet, Take 400 mg by mouth 3 (three) times daily as needed for muscle spasms., Disp: , Rfl:    morphine  (MS CONTIN ) 30 MG 12 hr tablet, Take 1 tablet (30 mg total) by mouth every 12 (twelve) hours., Disp: 60 tablet, Rfl: 0   nitroGLYCERIN  (NITROSTAT ) 0.4 MG SL tablet, Place 1 tablet (0.4 mg total) under the tongue every 5 (five) minutes as needed for chest pain., Disp: 90 tablet, Rfl: 3   ondansetron  (ZOFRAN ) 8 MG tablet, Take 1 tablet (8 mg total) by mouth every 8 (eight) hours as needed for nausea or vomiting. Start on the third day after cisplatin., Disp: 30 tablet, Rfl: 1   oxyCODONE  10 MG TABS, Take 1 tablet (10 mg total) by mouth every 6 (six) hours as needed., Disp: 120 tablet, Rfl: 0   pantoprazole  (PROTONIX ) 40 MG tablet, Take 1 tablet (40 mg total) by mouth 2 (two) times daily., Disp: 180 tablet, Rfl: 3   polyethylene glycol (MIRALAX / GLYCOLAX) 17 g packet, Take 17 g by mouth daily as needed for moderate constipation., Disp: , Rfl:    prochlorperazine  (COMPAZINE ) 10 MG tablet, Take 1 tablet (10 mg total) by mouth every 6 (six) hours as needed (Nausea or vomiting)., Disp: 30 tablet, Rfl: 1   rosuvastatin  (CRESTOR ) 40 MG tablet, Take 1 tablet (40 mg total) by  mouth daily., Disp: 90 tablet, Rfl: 3   Tetrahydrozoline HCl (REDNESS RELIEVER EYE DROPS OP), Place 1 drop into both eyes daily as needed (redness)., Disp: , Rfl:    TRELEGY ELLIPTA 100-62.5-25 MCG/ACT AEPB, Inhale 1 puff into the lungs daily., Disp: , Rfl:   PHYSICAL EXAM ECOG FS:2 - Symptomatic, <50% confined to bed    Vitals:   08/07/24 0852 08/07/24 0855  BP: (!) 87/45 (!) 78/48  Pulse: 99 100  Resp: 19 19  SpO2: 95% 97%   Physical Exam Vitals and nursing note reviewed.  Constitutional:      Appearance: He is ill-appearing. He is not toxic-appearing.  HENT:     Head: Normocephalic.     Mouth/Throat:     Mouth: Mucous membranes are dry.  Eyes:     Conjunctiva/sclera: Conjunctivae normal.  Neck:     Comments: Red skin on left lateral neck Cardiovascular:     Rate and Rhythm: Normal rate and regular rhythm.     Pulses: Normal pulses.     Heart sounds: Normal heart sounds.  Pulmonary:     Comments: Tachypnea.  Coarse lung sounds throughout Abdominal:     General: There is no distension.  Musculoskeletal:     Cervical back: Normal range of motion.  Skin:    General: Skin is warm and dry.  Neurological:     Mental Status: He is alert.        LABORATORY DATA I have reviewed the data as listed  Latest Ref Rng & Units 08/02/2024    9:03 AM 07/26/2024    8:03 AM 07/19/2024    8:49 AM  CBC  WBC 4.0 - 10.5 K/uL 1.1  1.9  3.6   Hemoglobin 13.0 - 17.0 g/dL 89.3  87.7  86.7   Hematocrit 39.0 - 52.0 % 29.3  34.3  36.8   Platelets 150 - 400 K/uL 52  120  112         Latest Ref Rng & Units 08/02/2024    9:03 AM 07/31/2024   11:45 AM 07/26/2024    8:03 AM  CMP  Glucose 70 - 99 mg/dL 855  871  883   BUN 8 - 23 mg/dL 21  32  26   Creatinine 0.61 - 1.24 mg/dL 8.88  8.63  8.84   Sodium 135 - 145 mmol/L 129  132  132   Potassium 3.5 - 5.1 mmol/L 3.1  3.6  3.5   Chloride 98 - 111 mmol/L 95  96  98   CO2 22 - 32 mmol/L 27  26  26    Calcium  8.9 - 10.3 mg/dL 8.4  8.9   9.1        RADIOGRAPHIC STUDIES (from last 24 hours if applicable) I have personally reviewed the radiological images as listed and agreed with the findings in the report. No results found.      Visit Diagnosis: 1. Primary squamous cell carcinoma of oropharynx (HCC)   2. Hypotension, unspecified hypotension type      No orders of the defined types were placed in this encounter.   All questions were answered. The patient knows to call the clinic with any problems, questions or concerns. No barriers to learning was detected.  A total of more than 40 minutes were spent on this encounter with face-to-face time and non-face-to-face time, including preparing to see the patient, ordering tests and/or medications, counseling the patient and coordination of care as outlined above.    Thank you for allowing me to participate in the care of this patient.    Yianna Tersigni E  Walisiewicz, PA-C Department of Hematology/Oncology [facility] Phone: [phone]  [phone]    08/07/2024 10:04 AM

## 2024-08-07 NOTE — H&P (Addendum)
 History and Physical    Patient: Gilbert Thompson FMW:[id number] DOB: 1951/04/23 DOA: 08/07/2024 DOS: the patient was seen and examined on 08/07/2024 PCP: Windy Coy, MD  Patient coming from: Home  Chief Complaint:  Chief Complaint  Patient presents with   Weakness   Hypotension   HPI: Gilbert Thompson is a 73 y.o. 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  Over there he had blood pressure 78/48 mmHg and was hypoxic at 89%.  His last radiation therapy was 3 days ago on Friday and last chemo 12 days ago.  The patient was found to be febrile in the emergency department.  He has been having increased sore throat, productive cough, dyspnea and pleuritic chest pain. He denied rhinorrhea or hemoptysis.  No palpitations, diaphoresis, PND, orthopnea or pitting edema of the lower extremities.  No abdominal pain, emesis, diarrhea, constipation, melena or hematochezia.  No flank pain, dysuria, frequency or hematuria.  No polyuria, polydipsia, polyphagia or blurred vision.   Lab work: Coronavirus, influenza and RSV PCR test was negative.  CBC showed a white count of 8.9, hemoglobin 8.4 g/dL and platelets 41.  Normal PT and INR.  CMP showed sodium 124, potassium 4.2, chloride 90 and CO2 25 mmol/L with a normal anion gap.  Renal function was normal.  Glucose 147 mg/dL, total protein 6.2 and albumin 2.9 g/dL, AST 72 and ALT 77 units/L.  Normal alkaline phosphatase and total bilirubin level.  Lactic acid level was 2.3 mmol/L.  Imaging: Portable 1 view chest radiograph with no acute findings.  CTA chest with no pulmonary embolism.  There are patchy airspace opacities, especially in the lower lobes, but also in the right  upper lobe and right middle lobe potentially from atypical or multilobar pneumonia.  Right high lung lymph node has increased from 0.7 to 1.3 cm (likely reactive).  Mild cardiomegaly.  Thoracic aortic and coronary artery atherosclerosis.  Bilateral airway thickening.  Thoracic spondylosis.   ED course: Initial vital signs were temperature 100.5 F, pulse 95, respiration 19, BP 119/51 mmHg and O2 sat 100% on nasal cannula 2 LPM.  The patient received LR 2500 mL liter bolus followed by LR at 150 mL/h x 20 hours, cefepime 2 g IVPB, metronidazole 500 mg IVP and vancomycin 1000 mg IVP.  Review of Systems: As mentioned in the history of present illness. All other systems reviewed and are negative. 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, follwed by [facility]   Dyspnea    with exertion   GERD (gastroesophageal reflux disease)    Hyperlipidemia    Hypertension    Pneumonia    Past Surgical History:  Procedure Laterality Date   CATARACT EXTRACTION Bilateral    IR GASTROSTOMY TUBE MOD SED  06/23/2024   IR IMAGING GUIDED PORT INSERTION  06/23/2024   LEFT HEART CATH AND CORONARY ANGIOGRAPHY N/A 06/05/2022   Procedure: LEFT HEART CATH AND CORONARY ANGIOGRAPHY;  Surgeon: Claudene Victory ORN, MD;  Location: [facility] IN[facility];  Service: Cardiovascular;  Laterality: N/A;  LYMPHADENECTOMY Bilateral 04/29/2023   Procedure: BILATERAL PELVIC LYMPHADENECTOMY;  Surgeon: Renda Glance, MD;  Location: [facility] ORS;  Service: Urology;  Laterality: Bilateral;   PROSTATE BIOPSY     ROBOT ASSISTED LAPAROSCOPIC RADICAL PROSTATECTOMY N/A 04/29/2023   Procedure: XI ROBOTIC ASSISTED LAPAROSCOPIC RADICAL PROSTATECTOMY LEVEL 2;  Surgeon: Renda Glance, MD;  Location: [facility] OR[facility];  Service: Urology;  Laterality: N/A;  210 MINUTES NEEDED FOR CASE   UPPER  GASTROINTESTINAL ENDOSCOPY     VENTRAL HERNIA REPAIR N/A 10/05/2023   Procedure: LAPAROSCOPIC VENTRAL HERNIA REPAAIR WITH MESH;  Surgeon: Rubin Calamity, MD;  Location: [facility];  Service: General;  Laterality: N/A;   Social History:  reports that he has never smoked. He has never used smokeless tobacco. He reports current alcohol use of about 14.0 standard drinks of alcohol per week. He reports that he does not use drugs.  No Known Allergies  Family History  Problem Relation Age of Onset   Coronary artery disease Father    Prostate cancer Father 63 - 59   Leukemia Father 62   Coronary artery disease Brother    Prostate cancer Brother 76 - 59   Colon cancer Brother 12   Pancreatic cancer Cousin        maternal first cousin   Breast cancer Daughter 23       negative genetic testing   Esophageal cancer Neg Hx    Rectal cancer Neg Hx    Stomach cancer Neg Hx     Prior to Admission medications   Medication Sig Start Date End Date Taking? Authorizing Provider  albuterol  (VENTOLIN  HFA) 108 (90 Base) MCG/ACT inhaler Inhale 1-2 puffs into the lungs every 6 (six) hours as needed for wheezing or shortness of breath. 03/03/22   [provider]  allopurinol  (ZYLOPRIM ) 300 MG tablet Take 300 mg by mouth daily. 03/03/22   [provider]  ALPRAZolam  (XANAX ) 1 MG tablet Take 1 mg by mouth at bedtime as needed for anxiety or sleep. 03/05/22   [provider]  aspirin  EC 81 MG tablet Take 81 mg by mouth daily. Swallow whole.    [provider]  celecoxib (CELEBREX) 200 MG capsule  03/07/24   [provider]  cholecalciferol (VITAMIN D3) 25 MCG (1000 UNIT) tablet Take 1,000 Units by mouth daily.    [provider]  dexamethasone  (DECADRON ) 4 MG tablet Take 2 tablets (8 mg) by mouth daily x 3 days starting the day after cisplatin chemotherapy. Take with food. 06/07/24   Pasam, Chinita, MD  ezetimibe  (ZETIA ) 10 MG tablet TAKE 1 TABLET BY MOUTH DAILY  05/18/24   Pietro Redell RAMAN, MD  finasteride  (PROSCAR ) 5 MG tablet Take 1.25 mg by mouth daily.    [provider]  HYDROcodone -acetaminophen  (NORCO/VICODIN) 5-325 MG tablet Take 1 tablet by mouth every 6 (six) hours as needed for severe pain. 01/28/22   [provider]  ibuprofen (ADVIL) 200 MG tablet Take 600 mg by mouth daily as needed for moderate pain (pain score 4-6).    [provider]  lidocaine  (XYLOCAINE ) 2 % solution Patient: Mix 1part 2% viscous lidocaine , 1part H20. Swish & swallow 10mL of diluted mixture, 30min before meals and at bedtime, up to QID 07/03/24   Izell Domino, MD  lidocaine -prilocaine  (EMLA ) cream Apply to affected area once 06/07/24   Pasam, Chinita, MD  magic mouthwash (nystatin, lidocaine , diphenhydrAMINE , alum & mag hydroxide) suspension Swish and swallow 5 mLs by mouth 4 (four) times daily as needed for  mouth pain. Suspension contains equal amounts of Maalox Extra Strength, nystatin, diphenhydramine  and lidocaine . Swish and Swallow 07/19/24   Pasam, Avinash, MD  metaxalone (SKELAXIN) 800 MG tablet Take 400 mg by mouth 3 (three) times daily as needed for muscle spasms.    [provider]  morphine  (MS CONTIN ) 30 MG 12 hr tablet Take 1 tablet (30 mg total) by mouth every 12 (twelve) hours. 08/07/24   Pasam, Chinita, MD  nitroGLYCERIN  (NITROSTAT ) 0.4 MG SL tablet Place 1 tablet (0.4 mg total) under the tongue every 5 (five) minutes as needed for chest pain. 05/15/22 08/02/24  Daneen Damien BROCKS, NP  ondansetron  (ZOFRAN ) 8 MG tablet Take 1 tablet (8 mg total) by mouth every 8 (eight) hours as needed for nausea or vomiting. Start on the third day after cisplatin. 06/07/24   Pasam, Chinita, MD  oxyCODONE  10 MG TABS Take 1 tablet (10 mg total) by mouth every 6 (six) hours as needed. 07/19/24   Pasam, Chinita, MD  pantoprazole  (PROTONIX ) 40 MG tablet Take 1 tablet (40 mg total) by mouth 2 (two) times daily. 03/19/23   Jerilynn Lamarr HERO, NP   polyethylene glycol (MIRALAX / GLYCOLAX) 17 g packet Take 17 g by mouth daily as needed for moderate constipation.    [provider]  prochlorperazine  (COMPAZINE ) 10 MG tablet Take 1 tablet (10 mg total) by mouth every 6 (six) hours as needed (Nausea or vomiting). 06/07/24   Pasam, Chinita, MD  rosuvastatin  (CRESTOR ) 40 MG tablet Take 1 tablet (40 mg total) by mouth daily. 03/19/23   Jerilynn Lamarr HERO, NP  Tetrahydrozoline HCl (REDNESS RELIEVER EYE DROPS OP) Place 1 drop into both eyes daily as needed (redness).    [provider]  TRELEGY ELLIPTA 100-62.5-25 MCG/ACT AEPB Inhale 1 puff into the lungs daily. 04/04/22   [provider]    Physical Exam: Vitals:   08/07/24 0958 08/07/24 1004  BP:  (!) 119/51  Pulse: 95   Temp: (!) 100.5 F (38.1 C)   TempSrc: Oral   SpO2:  100%   Physical Exam Vitals reviewed.  Constitutional:      Appearance: Normal appearance. He is ill-appearing.  HENT:     Head: Normocephalic.     Nose: No rhinorrhea.     Mouth/Throat:     Mouth: Mucous membranes are dry.  Eyes:     General: No scleral icterus.    Pupils: Pupils are equal, round, and reactive to light.  Cardiovascular:     Rate and Rhythm: Normal rate and regular rhythm.  Pulmonary:     Effort: Pulmonary effort is normal.     Breath sounds: Normal breath sounds.  Abdominal:     General: Bowel sounds are normal. There is no distension.     Palpations: Abdomen is soft.     Tenderness: There is no abdominal tenderness. There is no right CVA tenderness or left CVA tenderness.  Musculoskeletal:     Cervical back: Neck supple.     Right lower leg: No edema.     Left lower leg: No edema.  Skin:    General: Skin is warm and dry.  Neurological:     General: No focal deficit present.     Mental Status: He is alert and oriented to person, place, and time.  Psychiatric:        Mood and Affect: Mood normal.        Behavior: Behavior normal.     Data  Reviewed:  Results are  pending, will review when available.  Assessment and Plan: Principal Problem:   Neutropenia with fever In the setting of:   Multifocal pneumonia Meeting criteria for:   Sepsis due to undetermined organism (HCC) Admit to stepdown/inpatient. Continue supplemental oxygen. Scheduled bronchodilators. Solu-Medrol  40 mg IVP x 1 dose. Continue cefepime 2 g IVPB twice daily. Continue azithromycin 500 mg IVPB daily. Check strep pneumoniae urinary antigen. Check sputum Gram stain, culture and sensitivity. Follow-up blood culture and sensitivity. Follow-up CBC and chemistry in the morning.  Active Problems:   Hyponatremia Free water  restriction.   Continue IV fluids. Follow-up sodium level.    Oral thrush Magic mouthwash as needed. Nystatin swish and swallow 4 times a day.    Primary squamous cell carcinoma of oropharynx [facility]) Follow-up with oncology as an outpatient.    Leukopenia due to antineoplastic chemotherapy He did receive Zarxio for 3 days. Oncology my repeat another Zarzio course.    Sinus tachycardia In the setting of sepsis/anemia. Monitor heart rate.    Coronary artery disease involving native  coronary artery of native heart without angina pectoris On EC ASA 81 mg daily. Off rosuvastatin  now.    Pancytopenia (HCC) Check type and screen. Monitor CBC.   Transfuse blood products as needed.    Moderate protein malnutrition In the setting of anemia and malignancy. May benefit from protein supplementation. Consider nutritional services evaluation. Follow-up albumin level.    Transaminitis Follow-up LFTs. Further workup depending on results. The patient has been off statin.    Hypomagnesemia Replacing.    Hypophosphatemia Replacement ordered.    Advance Care Planning:   Code Status: Full Code   Consults:   Family Communication:   Severity of Illness: The appropriate patient status for this patient is INPATIENT. Inpatient  status is judged to be reasonable and necessary in order to provide the required intensity of service to ensure the patient's safety. The patient's presenting symptoms, physical exam findings, and initial radiographic and laboratory data in the context of their chronic comorbidities is felt to place them at high risk for further clinical deterioration. Furthermore, it is not anticipated that the patient will be medically stable for discharge from the hospital within 2 midnights of admission.   * I certify that at the point of admission it is my clinical judgment that the patient will require inpatient hospital care spanning beyond 2 midnights from the point of admission due to high intensity of service, high risk for further deterioration and high frequency of surveillance required.*  Author: Alm Dorn Castor, MD 08/07/2024 1:04 PM  For on call review [email or web address].   This document was prepared using Dragon voice recognition software and may contain some unintended transcription errors.

## 2024-08-07 NOTE — Patient Instructions (Signed)
 Dehydration, Adult Dehydration is a condition in which there is not enough water or other fluids in the body. This happens when a person loses more fluids than they take in. Important organs cannot work right without the right amount of fluids. Any loss of fluids from the body can cause dehydration. Dehydration can be mild, worse, or very bad. It should be treated right away to keep it from getting very bad. What are the causes? Conditions that cause loss of water in the body. They include: Watery poop (diarrhea). Vomiting. Sweating a lot. Fever. Infection. Peeing (urinating) a lot. Not drinking enough fluids. Certain medicines, such as medicines that take extra fluid out of the body (diuretics). Lack of safe drinking water. Not being able to get enough water and food. What increases the risk? Having a long-term (chronic) illness that has not been treated the right way, such as: Diabetes. Heart disease. Kidney disease. Being 25 years of age or older. Having a disability. Living in a place that is high above the ground or sea (high in altitude). The thinner, drier air causes more fluid loss. Doing exercises that put stress on your body for a long time. Being active when in hot places. What are the signs or symptoms? Symptoms of dehydration depend on how bad it is. Mild or worse dehydration Thirst. Dry lips or dry mouth. Feeling dizzy or light-headed. Muscle cramps. Passing little pee or dark pee. Pee may be the color of tea. Headache. Very bad dehydration Changes in skin. Skin may: Be cold to the touch (clammy). Be blotchy or pale. Not go back to normal right after you pinch it and let it go. Little or no tears, pee, or sweat. Fast breathing. Low blood pressure. Weak pulse. Pulse that is more than 100 beats a minute when you are sitting still. Other changes, such as: Feeling very thirsty. Eyes that look hollow (sunken). Cold hands and feet. Being confused. Being very  tired (lethargic) or having trouble waking from sleep. Losing weight. Loss of consciousness. How is this treated? Treatment for this condition depends on how bad your dehydration is. Treatment should start right away. Do not wait until your condition gets very bad. Very bad dehydration is an emergency. You will need to go to a hospital. Mild or worse dehydration can be treated at home. You may be asked to: Drink more fluids. Drink an oral rehydration solution (ORS). This drink gives you the right amount of fluids, salts, and minerals (electrolytes). Very bad dehydration can be treated: With fluids through an IV tube. By correcting low levels of electrolytes in the body. By treating the problem that caused your dehydration. Follow these instructions at home: Oral rehydration solution If told by your doctor, drink an ORS: Make an ORS. Use instructions on the package. Start by drinking small amounts, about  cup (120 mL) every 5-10 minutes. Slowly drink more until you have had the amount that your doctor said to have.  Eating and drinking  Drink enough clear fluid to keep your pee pale yellow. If you were told to drink an ORS, finish the ORS first. Then, start slowly drinking other clear fluids. Drink fluids such as: Water. Do not drink only water. Doing that can make the salt (sodium) level in your body get too low. Water from ice chips you suck on. Fruit juice that you have added water to (diluted). Low-calorie sports drinks. Eat foods that have the right amounts of salts and minerals, such as bananas, oranges, potatoes,  tomatoes, or spinach. Do not drink alcohol. Avoid drinks that have caffeine or sugar. These include:: High-calorie sports drinks. Fruit juice that you did not add water to. Soda. Coffee or energy drinks. Avoid foods that are greasy or have a lot of fat or sugar. General instructions Take over-the-counter and prescription medicines only as told by your doctor. Do  not take sodium tablets. Doing that can make the salt level in your body get too high. Return to your normal activities as told by your doctor. Ask your doctor what activities are safe for you. Keep all follow-up visits. Your doctor may check and change your treatment. Contact a doctor if: You have pain in your belly (abdomen) and the pain: Gets worse. Stays in one place. You have a rash. You have a stiff neck. You get angry or annoyed more easily than normal. You are more tired or have a harder time waking than normal. You feel weak or dizzy. You feel very thirsty. Get help right away if: You have any symptoms of very bad dehydration. You vomit every time you eat or drink. Your vomiting gets worse, does not go away, or you vomit blood or green stuff. You are getting treatment, but symptoms are getting worse. You have a fever. You have a very bad headache. You have: Diarrhea that gets worse or does not go away. Blood in your poop (stool). This may cause poop to look black and tarry. No pee in 6-8 hours. Only a small amount of pee in 6-8 hours, and the pee is very dark. You have trouble breathing. These symptoms may be an emergency. Get help right away. Call 911. Do not wait to see if the symptoms will go away. Do not drive yourself to the hospital. This information is not intended to replace advice given to you by your health care provider. Make sure you discuss any questions you have with your health care provider. Document Revised: 04/06/2022 Document Reviewed: 04/06/2022 Elsevier Patient Education  2024 [employer].

## 2024-08-07 NOTE — Progress Notes (Signed)
 Patient presented to clinic for IVF. Patient found to be hypotensive, tachycardic, and low oxygen sats. Patient noted new cough and malaise starting Saturday. Patient assessed by Mallie Combes, PA-C at chairside.  Decision made to bring patient to the ED for further evaluation.

## 2024-08-07 NOTE — ED Triage Notes (Signed)
 Pt brought from cancer center, has head and neck cancer, come in for fluids, chemo 11/5 and radiation 11/14. Hypotensive 89/50 upon arrival the 78/48 and noted to be hypoxic RA 89%. Has access with regular port access (not power port). ? Aspiration since he is still taking po's and has a g-tube.

## 2024-08-07 NOTE — Sepsis Progress Note (Signed)
 Sepsis protocol monitored by eLink

## 2024-08-07 NOTE — ED Provider Notes (Addendum)
 [place] EMERGENCY DEPARTMENT AT [facility] ([facility]) Provider Note   CSN: [id number] Arrival date & time: 08/07/24  9058     Patient presents with: Weakness and Hypotension   Gilbert Thompson is a 73 y.o. male.   Patient sent over from cancer center.  Normally comes in Monday Wednesdays and Fridays and gets IV fluids prior to radiation treatment.  Patient was noted to be significantly hypotensive today with a blood pressure 78/48 which is much lower than usual.  Had some hypoxia with room air 89%.  Has access with his regular port.  Patient also has a G-tube.  He has concerns for about aspiration possibly.  Patient was given 1 L of fluid there blood pressure did improve here temp 100.5 pulse 95 and blood pressure was 110 systolic.  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  Concern for extranodal extension based on imaging.  Started concurrent chemoradiation with weekly cisplatin on October 7.  Patient currently receiving radiation treatment Monday Wednesdays and Fridays.  Patient is followed by Pasam from oncology.       Prior to Admission medications   Medication Sig Start Date End Date Taking? Authorizing Provider  albuterol  (VENTOLIN  HFA) 108 (90 Base) MCG/ACT inhaler Inhale 1-2 puffs into the lungs every 6 (six) hours as needed for wheezing or shortness of breath. 03/03/22   [provider]  allopurinol  (ZYLOPRIM ) 300 MG tablet Take 300 mg by mouth daily. 03/03/22   [provider]  ALPRAZolam  (XANAX ) 1 MG tablet Take 1 mg by mouth at bedtime as needed for anxiety or sleep. 03/05/22   [provider]  aspirin  EC 81 MG tablet Take 81 mg by mouth daily. Swallow whole.    [provider]  celecoxib (CELEBREX) 200 MG capsule  03/07/24    [provider]  cholecalciferol (VITAMIN D3) 25 MCG (1000 UNIT) tablet Take 1,000 Units by mouth daily.    [provider]  dexamethasone  (DECADRON ) 4 MG tablet Take 2 tablets (8 mg) by mouth daily x 3 days starting the day after cisplatin chemotherapy. Take with food. 06/07/24   Pasam, Chinita, MD  ezetimibe  (ZETIA ) 10 MG tablet TAKE 1 TABLET BY MOUTH DAILY 05/18/24   Pietro Redell RAMAN, MD  finasteride  (PROSCAR ) 5 MG tablet Take 1.25 mg by mouth daily.    [provider]  HYDROcodone -acetaminophen  (NORCO/VICODIN) 5-325 MG tablet Take 1 tablet by mouth every 6 (six) hours as needed for severe pain. 01/28/22   [provider]  ibuprofen (ADVIL) 200 MG tablet Take 600 mg by mouth daily as needed for moderate pain (pain score 4-6).    [provider]  lidocaine  (XYLOCAINE ) 2 % solution Patient: Mix 1part 2% viscous lidocaine , 1part H20. Swish & swallow 10mL of diluted mixture, 30min before meals and at bedtime, up to QID 07/03/24   Izell Domino, MD  lidocaine -prilocaine  (EMLA ) cream Apply to affected area once 06/07/24   Pasam, Chinita, MD  magic mouthwash (nystatin, lidocaine , diphenhydrAMINE , alum & mag hydroxide) suspension Swish and swallow 5 mLs by mouth 4 (four) times daily as needed for mouth pain. Suspension contains equal amounts of Maalox Extra Strength, nystatin, diphenhydramine  and lidocaine . Swish and Swallow 07/19/24   Pasam, Avinash, MD  metaxalone (SKELAXIN) 800 MG tablet Take 400 mg by mouth 3 (three) times daily as needed for  muscle spasms.    [provider]  morphine  (MS CONTIN ) 30 MG 12 hr tablet Take 1 tablet (30 mg total) by mouth every 12 (twelve) hours. 08/07/24   Pasam, Chinita, MD  nitroGLYCERIN  (NITROSTAT ) 0.4 MG SL tablet Place 1 tablet (0.4 mg total) under the tongue every 5 (five) minutes as needed for chest pain. 05/15/22 08/02/24  Daneen Damien BROCKS, NP  ondansetron  (ZOFRAN ) 8 MG tablet Take 1 tablet (8 mg total) by mouth every  8 (eight) hours as needed for nausea or vomiting. Start on the third day after cisplatin. 06/07/24   Pasam, Chinita, MD  oxyCODONE  10 MG TABS Take 1 tablet (10 mg total) by mouth every 6 (six) hours as needed. 07/19/24   Pasam, Chinita, MD  pantoprazole  (PROTONIX ) 40 MG tablet Take 1 tablet (40 mg total) by mouth 2 (two) times daily. 03/19/23   Jerilynn Lamarr HERO, NP  polyethylene glycol (MIRALAX / GLYCOLAX) 17 g packet Take 17 g by mouth daily as needed for moderate constipation.    [provider]  prochlorperazine  (COMPAZINE ) 10 MG tablet Take 1 tablet (10 mg total) by mouth every 6 (six) hours as needed (Nausea or vomiting). 06/07/24   Pasam, Chinita, MD  rosuvastatin  (CRESTOR ) 40 MG tablet Take 1 tablet (40 mg total) by mouth daily. 03/19/23   Jerilynn Lamarr HERO, NP  Tetrahydrozoline HCl (REDNESS RELIEVER EYE DROPS OP) Place 1 drop into both eyes daily as needed (redness).    [provider]  TRELEGY ELLIPTA 100-62.5-25 MCG/ACT AEPB Inhale 1 puff into the lungs daily. 04/04/22   [provider]    Allergies: Patient has no known allergies.    Review of Systems  Constitutional:  Positive for appetite change and fever. Negative for chills.  HENT:  Negative for ear pain and sore throat.   Eyes:  Negative for pain and visual disturbance.  Respiratory:  Negative for cough and shortness of breath.   Cardiovascular:  Negative for chest pain and palpitations.  Gastrointestinal:  Negative for abdominal pain and vomiting.  Genitourinary:  Negative for dysuria and hematuria.  Musculoskeletal:  Negative for arthralgias and back pain.  Skin:  Negative for color change and rash.  Neurological:  Negative for seizures and syncope.  Psychiatric/Behavioral:  Positive for confusion.   All other systems reviewed and are negative.   Updated Vital Signs BP (!) 119/51 (BP Location: Left Arm)   Pulse 95   Temp (!) 100.5 F (38.1 C) (Oral)   SpO2 100%   Physical Exam Vitals and  nursing note reviewed.  Constitutional:      General: He is not in acute distress.    Appearance: Normal appearance. He is well-developed.  HENT:     Head: Normocephalic and atraumatic.     Mouth/Throat:     Mouth: Mucous membranes are dry.  Eyes:     Extraocular Movements: Extraocular movements intact.     Conjunctiva/sclera: Conjunctivae normal.     Pupils: Pupils are equal, round, and reactive to light.  Neck:     Comments: Radiation skin changes to the left lateral neck. Cardiovascular:     Rate and Rhythm: Normal rate and regular rhythm.     Heart sounds: No murmur heard. Pulmonary:     Effort: Pulmonary effort is normal. No respiratory distress.     Breath sounds: Normal breath sounds. No wheezing, rhonchi or rales.     Comments: Patient has a port right anterior chest Abdominal:     Palpations: Abdomen  is soft.     Tenderness: There is no abdominal tenderness.     Comments: G-tube left anterior abdomen  Musculoskeletal:        General: No swelling.     Cervical back: Normal range of motion and neck supple.  Skin:    General: Skin is warm and dry.     Capillary Refill: Capillary refill takes less than 2 seconds.  Neurological:     General: No focal deficit present.     Mental Status: He is alert and oriented to person, place, and time.  Psychiatric:        Mood and Affect: Mood normal.     (all labs ordered are listed, but only abnormal results are displayed) Labs Reviewed  COMPREHENSIVE METABOLIC PANEL WITH GFR - Abnormal; Notable for the following components:      Result Value   Sodium 124 (*)    Chloride 90 (*)    Glucose, Bld 147 (*)    Calcium  8.6 (*)    Total Protein 6.2 (*)    Albumin 2.9 (*)    AST 72 (*)    ALT 77 (*)    All other components within normal limits  CBC WITH DIFFERENTIAL/PLATELET - Abnormal; Notable for the following components:   WBC 0.9 (*)    RBC 2.64 (*)    Hemoglobin 8.4 (*)    HCT 23.6 (*)    Platelets 41 (*)    Neutro Abs  0.4 (*)    Lymphs Abs 0.5 (*)    All other components within normal limits  I-STAT CG4 LACTIC ACID, ED - Abnormal; Notable for the following components:   Lactic Acid, Venous 2.3 (*)    All other components within normal limits  RESP PANEL BY RT-PCR (RSV, FLU A&B, COVID)  [id number]  CULTURE, BLOOD (ROUTINE X 2)  CULTURE, BLOOD (ROUTINE X 2)  PROTIME-INR  URINALYSIS, W/ REFLEX TO CULTURE (INFECTION SUSPECTED)  I-STAT CG4 LACTIC ACID, ED    EKG: None  Radiology: DG Chest Port 1 View Result Date: 08/07/2024 CLINICAL DATA:  Questionable sepsis. Head and neck cancer, hypotension with decreased O2 sats. EXAM: PORTABLE CHEST 1 VIEW COMPARISON:  09/28/2022.  CT chest 04/06/2024. FINDINGS: Right IJ Port-A-Cath tip is at the SVC RA junction. Lungs are clear. No pleural fluid. IMPRESSION: No acute findings. Electronically Signed   By: Newell Eke M.D.   On: 08/07/2024 10:59     Procedures   Medications Ordered in the ED  lactated ringers  infusion (has no administration in time range)  vancomycin (VANCOCIN) IVPB 1000 mg/200 mL premix (1,000 mg Intravenous New Bag/Given 08/07/24 1146)  lactated ringers  bolus 1,000 mL (0 mLs Intravenous Stopped 08/07/24 1114)    And  lactated ringers  bolus 1,000 mL (0 mLs Intravenous Stopped 08/07/24 1123)    And  lactated ringers  bolus 500 mL (500 mLs Intravenous New Bag/Given 08/07/24 1147)  ceFEPIme (MAXIPIME) 2 g in sodium chloride  0.9 % 100 mL IVPB (0 g Intravenous Stopped 08/07/24 1107)  metroNIDAZOLE (FLAGYL) IVPB 500 mg (0 mg Intravenous Stopped 08/07/24 1144)                                    Medical Decision Making Amount and/or Complexity of Data Reviewed Labs: ordered. Radiology: ordered.  Risk Prescription drug management.   CRITICAL CARE Performed by: Hartman Minahan Total critical care time: 45 minutes Critical care time was exclusive of  separately billable procedures and treating other patients. Critical care was necessary to  treat or prevent imminent or life-threatening deterioration. Critical care was time spent personally by me on the following activities: development of treatment plan with patient and/or surrogate as well as nursing, discussions with consultants, evaluation of patient's response to treatment, examination of patient, obtaining history from patient or surrogate, ordering and performing treatments and interventions, ordering and review of laboratory studies, ordering and review of radiographic studies, pulse oximetry and re-evaluation of patient's condition.  EKG did not crossover.  Sinus rhythm with a rate of 95 prolonged PR interval right bundle branch block minimal ST segment elevation.  Patient here with fever hypotension.  Sepsis protocol initiated.  Initial lactic acid is 2.3.  Complete metabolic panel sodium 124 glucose 147 albumin 2.9 LFTs total bili alk phos normal AST 72 ALT 77 GFR is greater than 60 which is reassuring.  INR 1.1.  White blood cell count is 0.9 absolute neutrophils 0.4.  Which is significant.  Portable chest x-ray no acute findings.  Patient is going to need admission was started on broad-spectrum antibiotics.  Will going get CT angio chest just to rule out PE.  Get better view of his chest with the concerns with aspiration and the fever.  Will send a text to his oncologist.  And go forward with getting him admitted.   Final diagnoses:  Neutropenia, unspecified type  Fever, unspecified fever cause  Sepsis, due to unspecified organism, unspecified whether acute organ dysfunction present [facility])  Hyponatremia    ED Discharge Orders     None          Geraldene Hamilton, MD 08/07/24 1213    Geraldene Hamilton, MD 08/07/24 1236

## 2024-08-07 NOTE — ED Notes (Signed)
 Pt attempted to give a urine specimen but wasn't able to at this time !!

## 2024-08-07 NOTE — ED Notes (Signed)
 Wife states that the pt may have taken too much of his daily medications. She states that his pill organizer was missing a day. Pt does not remember if he took too much or not.

## 2024-08-07 NOTE — Progress Notes (Signed)
 [facility]  HEMATOLOGY/ONCOLOGY IN-PATIENT PROGRESS NOTE   PATIENT NAME: Gilbert Thompson   MR#: [id number] DOB: 1950-11-18 CSN#: [id number]   DATE OF SERVICE: 08/07/2024  ASSESSMENT & PLAN:   Gilbert Thompson is a pleasant 73 y.o. gentleman who is well-known to our service for his primary squamous cell carcinoma of the oropharynx, stage I disease with extranodal extension, currently received concurrent chemoradiation, last chemotherapy on 07/26/2024, further chemotherapy held because of neutropenia despite G-CSF support.  He presented to the clinic today for IV fluids that he has been receiving at least 3 days a week.  He was noted to be hypotensive and given generalized weakness, subjective fevers, he was directed to the ED for further evaluation.  Clinical picture concerning for sepsis, likely from pneumonia of right upper and middle lobes, in the setting of neutropenia.  Neutropenic fever/ bacterial pneumonia of right upper and middle lobes in the setting of neutropenia Pneumonia in the right upper and middle lobes, likely bacterial, given negative respiratory panel for flu, COVID, and RSV. Neutropenia complicates the infection. White blood cell count remains low.  - High lactic acid levels consistent with septic picture - Continue aggressive sepsis management with broad-spectrum antibiotics and IV fluids - Started him on Zarxio 300 mcg subcutaneously daily and will continue this as long as ANC is below 1000.  Malignant neoplasm under active management with chemotherapy and radiation -Last chemotherapy with cisplatin cycle 5 was on 07/26/2024.  Further chemotherapy was withheld because of neutropenia and has been receiving Zarxio support in our clinic.   - Currently undergoing radiation therapy. Radiation therapy to be completed as planned. - Continue radiation therapy - We will hold further chemotherapy.  Lactic acidosis Elevated lactic acid levels, likely secondary to  infection. Monitoring ongoing to assess response to treatment.  Rest of the management is as per primary team.  We will continue to follow.  Please call us  with any questions or concerns.   [name], MD 08/07/2024 5:23 PM  ONCOLOGY HISTORY:    He presented to his PCP with complains of chronic pharyngitis and a left submandibular mass that has persisted over the past year.   To further investigate his symptoms, he underwent a CT neck and CT chest on 04/06/24 showing a 3.0 x 2.4 x 2.5 cm soft tissue mass centered in the left oropharynx at the glossotonsillar sulcus with possible involvement of the adjacent tongue base. 7 mm low-attenuation left level IB lymph node likely reflecting a site of nodal metastatic disease. 1.7 x 1.7 cm irregular soft tissue focus at the left level II station, inseparable from the adjacent sternocleidomastoid muscle. This is suspicious for nodal metastatic disease (with findings concerning for extracapsular extension) or a tumor deposit. 9 mm short-axis low attenuation left level III lymph node likely reflecting a site of nodal metastatic disease. Scans also noted scattered tiny pulmonary nodules are unchanged and likely benign but with only 5 months of documented stability but with recommend attention on follow-up given his history of prostate cancer.    Subsequently, the patient was referred to Dr. Graig on 04/24/24 who performed a FNA biopsy of left neck. However, biopsy was inconclusive. As a result, he then underwent a laryngoscopy with biopsy on 05/15/24 under the care of Dr. Lauralee. 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 Tumor is moderately differentiated with foci of poor differentiation and invasion into the striated muscle. Immunohistochemical studies are p16, p40, and HRHPV  are positive.    He had a cervical spine MRI performed on 05/09/24 revealing no evidence of metastatic disease to  the cervical spine itself but did indicated degenerative spondylosis at C4-5 and C5-6. 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 PET scan performed on 04/26/24 showing FDG avid mass along the left tongue base/oropharynx with left cervical chain nodal metastatic disease with no suspicious pulmonary or mediastinal FDG uptake.   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    Prior cancers, if any: history of prostate cancer.   Given extracapsular extension with lymph node positive disease, plan made to proceed with concurrent chemoradiation.   Plan made for weekly cisplatin with radiation, if renal function allows.  Otherwise we will use either docetaxel or carboplatin/paclitaxel regimen.   Started concurrent chemoradiation with weekly cisplatin from 06/27/2024.   Creatinine went up to 1.44 on 07/12/2024.  Dose reduced cisplatin by 50% with cycle 3 as a result.  With normalization of creatinine, cisplatin was resumed at standard dose from cycle 4 onwards.  Cycle 5 of chemotherapy was given on 07/26/2024.  Further chemotherapy held because of neutropenia.  SUBJECTIVE:   Patient seen and evaluated.  His wife was by the bedside.  He has been having some tremors.  He could not contribute much to the history because of speech difficulty.  His wife contributed to the history.  She reports that he has been severely fatigued especially over the weekend.  Today we tried to give him IV fluids in the clinic but he remained hypotensive and hence was directed to the ED.  Had subjective fever, generalized weakness and hypotension.  Once in the ED, he was found to be septic, from presumed aspiration pneumonia in the right upper and right middle lobes.  On broad-spectrum antibiotics.  OBJECTIVE:  Vitals:   08/07/24 1405 08/07/24 1611  BP: 125/86 110/60  Pulse: (!) 118 100  Resp:  18  Temp: 98.2 F (36.8  C) 98.6 F (37 C)  SpO2: 95% 99%     Intake/Output Summary (Last 24 hours) at 08/07/2024 1723 Last data filed at 08/07/2024 1346 Gross per 24 hour  Intake 3403.5 ml  Output --  Net 3403.5 ml    Physical Exam Constitutional:      Comments: Appears to be in distress, with tremors.  Ill-appearing.  HENT:     Head: Normocephalic and atraumatic.  Eyes:     Conjunctiva/sclera: Conjunctivae normal.  Cardiovascular:     Rate and Rhythm: Tachycardia present.  Pulmonary:     Effort: Pulmonary effort is normal. No respiratory distress.  Abdominal:     General: There is no distension.    LABS:   Results for orders placed or performed during the hospital encounter of 08/07/24 (from the past 24 hours)  Comprehensive metabolic panel     Status: Abnormal   Collection Time: 08/07/24 10:35 AM  Result Value Ref Range   Sodium 124 (L) 135 - 145 mmol/L   Potassium 4.2 3.5 - 5.1 mmol/L   Chloride 90 (L) 98 - 111 mmol/L   CO2 25 22 - 32 mmol/L   Glucose, Bld 147 (H) 70 - 99 mg/dL   BUN 15 8 - 23 mg/dL   Creatinine, Ser 8.78 0.61 - 1.24 mg/dL   Calcium  8.6 (L) 8.9 - 10.3 mg/dL   Total Protein 6.2 (L) 6.5 - 8.1 g/dL  Albumin 2.9 (L) 3.5 - 5.0 g/dL   AST 72 (H) 15 - 41 U/L   ALT 77 (H) 0 - 44 U/L   Alkaline Phosphatase 64 38 - 126 U/L   Total Bilirubin 0.6 0.0 - 1.2 mg/dL   GFR, Estimated >39 >39 mL/min   Anion gap 9 5 - 15  CBC with Differential     Status: Abnormal   Collection Time: 08/07/24 10:35 AM  Result Value Ref Range   WBC 0.9 (LL) 4.0 - 10.5 K/uL   RBC 2.64 (L) 4.22 - 5.81 MIL/uL   Hemoglobin 8.4 (L) 13.0 - 17.0 g/dL   HCT 76.3 (L) 60.9 - 47.9 %   MCV 89.4 80.0 - 100.0 fL   MCH 31.8 26.0 - 34.0 pg   MCHC 35.6 30.0 - 36.0 g/dL   RDW 86.9 88.4 - 84.4 %   Platelets 41 (L) 150 - 400 K/uL   nRBC 0.0 0.0 - 0.2 %   Neutrophils Relative % 42 %   Neutro Abs 0.4 (LL) 1.7 - 7.7 K/uL   Lymphocytes Relative 49 %   Lymphs Abs 0.5 (L) 0.7 - 4.0 K/uL   Monocytes Relative 7 %    Monocytes Absolute 0.1 0.1 - 1.0 K/uL   Eosinophils Relative 0 %   Eosinophils Absolute 0.0 0.0 - 0.5 K/uL   Basophils Relative 1 %   Basophils Absolute 0.0 0.0 - 0.1 K/uL   WBC Morphology See Note    RBC Morphology MORPHOLOGY UNREMARKABLE    Smear Review Normal platelet morphology    Immature Granulocytes 1 %   Abs Immature Granulocytes 0.01 0.00 - 0.07 K/uL  Blood Culture (routine x 2)     Status: None (Preliminary result)   Collection Time: 08/07/24 10:36 AM   Specimen: BLOOD  Result Value Ref Range   Specimen Description      BLOOD RIGHT ANTECUBITAL Performed at [facility], [address] [place], [place] [address]    Special Requests      BOTTLES DRAWN AEROBIC AND ANAEROBIC Blood Culture results may not be optimal due to an inadequate volume of blood received in culture bottles Performed at [facility], [address], [place], [place] [address]    Culture      NO GROWTH < 12 HOURS Performed at [facility], [address], [place], [place] [address]    Report Status PENDING   Blood Culture (routine x 2)     Status: None (Preliminary result)   Collection Time: 08/07/24 10:36 AM   Specimen: BLOOD  Result Value Ref Range   Specimen Description      BLOOD BLOOD RIGHT FOREARM Performed at [facility], [address], [place], [place] [address]    Special Requests      BOTTLES DRAWN AEROBIC ONLY Blood Culture results may not be optimal due to an inadequate volume of blood received in culture bottles Performed at [facility], [address] [place], [place] [address]    Culture      NO GROWTH < 12 HOURS Performed at [facility], [address] [place], [place] [address]    Report Status PENDING   Protime-INR     Status: None   Collection Time: 08/07/24 10:37 AM  Result Value Ref Range   Prothrombin Time 15.0 11.4 - 15.2 seconds   INR 1.1 0.8 - 1.2  I-Stat Lactic Acid, ED      Status: Abnormal  Collection Time: 08/07/24 10:53 AM  Result Value Ref Range   Lactic Acid, Venous 2.3 (HH) 0.5 - 1.9 mmol/L   Comment NOTIFIED PHYSICIAN   Resp panel by RT-PCR (RSV, Flu A&B, Covid) Anterior Nasal Swab     Status: None   Collection Time: 08/07/24 11:40 AM   Specimen: Anterior Nasal Swab  Result Value Ref Range   SARS Coronavirus 2 by RT PCR NEGATIVE NEGATIVE   Influenza A by PCR NEGATIVE NEGATIVE   Influenza B by PCR NEGATIVE NEGATIVE   Resp Syncytial Virus by PCR NEGATIVE NEGATIVE  I-Stat Lactic Acid, ED     Status: Abnormal   Collection Time: 08/07/24  2:00 PM  Result Value Ref Range   Lactic Acid, Venous 4.0 (HH) 0.5 - 1.9 mmol/L   Comment NOTIFIED PHYSICIAN   I-Stat CG4 Lactic Acid     Status: Abnormal   Collection Time: 08/07/24  2:16 PM  Result Value Ref Range   Lactic Acid, Venous 7.2 (HH) 0.5 - 1.9 mmol/L   Comment NOTIFIED PHYSICIAN   Basic metabolic panel     Status: Abnormal   Collection Time: 08/07/24  3:06 PM  Result Value Ref Range   Sodium 127 (L) 135 - 145 mmol/L   Potassium 3.8 3.5 - 5.1 mmol/L   Chloride 98 98 - 111 mmol/L   CO2 14 (L) 22 - 32 mmol/L   Glucose, Bld 81 70 - 99 mg/dL   BUN 9 8 - 23 mg/dL   Creatinine, Ser 9.51 (L) 0.61 - 1.24 mg/dL   Calcium  6.7 (L) 8.9 - 10.3 mg/dL   GFR, Estimated >39 >39 mL/min   Anion gap 14 5 - 15  Magnesium      Status: Abnormal   Collection Time: 08/07/24  3:06 PM  Result Value Ref Range   Magnesium  0.7 (LL) 1.7 - 2.4 mg/dL  Phosphorus     Status: Abnormal   Collection Time: 08/07/24  3:06 PM  Result Value Ref Range   Phosphorus <1.0 (LL) 2.5 - 4.6 mg/dL  Lactic acid, plasma     Status: Abnormal   Collection Time: 08/07/24  3:09 PM  Result Value Ref Range   Lactic Acid, Venous 6.4 (HH) 0.5 - 1.9 mmol/L  Urinalysis, w/ Reflex to Culture (Infection Suspected) -Urine, Clean Catch     Status: Abnormal   Collection Time: 08/07/24  3:15 PM  Result Value Ref Range   Specimen Source URINE, CLEAN  CATCH    Color, Urine YELLOW YELLOW   APPearance CLEAR CLEAR   Specific Gravity, Urine 1.028 1.005 - 1.030   pH 6.0 5.0 - 8.0   Glucose, UA 50 (A) NEGATIVE mg/dL   Hgb urine dipstick NEGATIVE NEGATIVE   Bilirubin Urine NEGATIVE NEGATIVE   Ketones, ur NEGATIVE NEGATIVE mg/dL   Protein, ur 899 (A) NEGATIVE mg/dL   Nitrite NEGATIVE NEGATIVE   Leukocytes,Ua NEGATIVE NEGATIVE   RBC / HPF 0-5 0 - 5 RBC/hpf   WBC, UA 0-5 0 - 5 WBC/hpf   Bacteria, UA RARE (A) NONE SEEN   Squamous Epithelial / HPF 0-5 0 - 5 /HPF   Hyaline Casts, UA PRESENT      IMAGING STUDIES:   CT Angio Chest PE W/Cm &/Or Wo Cm Result Date: 08/07/2024 EXAM: CTA CHEST 08/07/2024 01:30:55 PM TECHNIQUE: CTA of the chest was performed after the administration of intravenous contrast. Multiplanar reformatted images are provided for review. MIP images are provided for review. Automated exposure control, iterative reconstruction, and/or weight based adjustment of  the mA/kV was utilized to reduce the radiation dose to as low as reasonably achievable. COMPARISON: None available. 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 * Tracking Code: BO * right port a cath tip colon SVC. FINDINGS: PULMONARY ARTERIES: Pulmonary arteries are adequately opacified for evaluation. No acute pulmonary embolus. Main pulmonary artery is normal in caliber. MEDIASTINUM: Mild cardiomegaly. Thoracic aortic, coronary artery, and branch vessel atheromatous vascular calcifications. There is no acute abnormality of the thoracic aorta. LYMPH NODES: Right high lung node 1.3 cm in short axis on image 62 series 5, formerly 0.7 cm. No mediastinal or axillary lymphadenopathy. LUNGS AND PLEURA: Bilateral airway thickening noted. Patchy and nodular regions of airspace opacity in the right upper lobe, right middle lobe, and confluent in both lower lobes especially  dependently raising the possibility of multilobar pneumonia. There is also some faint ground glass opacity posteriorly in the lingula. No evidence of pleural effusion or pneumothorax. UPPER ABDOMEN: Limited images of the upper abdomen are unremarkable. Peg tube noted. SOFT TISSUES AND BONES: Thoracic spondylosis. No acute bone or soft tissue abnormality. IMPRESSION: 1. No pulmonary embolism. 2. Patchy airspace opacities, especially in the lower lobes, but also in the right upper lobe and right middle lobe potentially from atypical or multilobar pneumonia. 3. Enlarged right high lung lymph node measuring 1.3 cm short axis, increased from 0.7 cm. This is likely reactive given the findings in the lung, although surveillance is suggested. 4. Mild cardiomegaly. 5. Thoracic aortic and coronary artery atherosclerosis. 6. Bilateral airway thickening. 7. Thoracic spondylosis. Electronically signed by: Ryan Salvage MD 08/07/2024 02:26 PM EST RP Workstation: [id number]   DG Chest Port 1 View Result Date: 08/07/2024 CLINICAL DATA:  Questionable sepsis. Head and neck cancer, hypotension with decreased O2 sats. EXAM: PORTABLE CHEST 1 VIEW COMPARISON:  09/28/2022.  CT chest 04/06/2024. FINDINGS: Right IJ Port-A-Cath tip is at the SVC RA junction. Lungs are clear. No pleural fluid. IMPRESSION: No acute findings. Electronically Signed   By: Newell Eke M.D.   On: 08/07/2024 10:59

## 2024-08-08 ENCOUNTER — Ambulatory Visit

## 2024-08-08 ENCOUNTER — Other Ambulatory Visit: Payer: Self-pay

## 2024-08-08 DIAGNOSIS — Z510 Encounter for antineoplastic radiation therapy: Secondary | ICD-10-CM | POA: Diagnosis not present

## 2024-08-08 DIAGNOSIS — A419 Sepsis, unspecified organism: Secondary | ICD-10-CM | POA: Diagnosis not present

## 2024-08-08 DIAGNOSIS — D709 Neutropenia, unspecified: Secondary | ICD-10-CM | POA: Diagnosis not present

## 2024-08-08 DIAGNOSIS — C090 Malignant neoplasm of tonsillar fossa: Secondary | ICD-10-CM | POA: Diagnosis not present

## 2024-08-08 DIAGNOSIS — E871 Hypo-osmolality and hyponatremia: Secondary | ICD-10-CM | POA: Diagnosis not present

## 2024-08-08 DIAGNOSIS — D701 Agranulocytosis secondary to cancer chemotherapy: Secondary | ICD-10-CM | POA: Diagnosis not present

## 2024-08-08 LAB — COMPREHENSIVE METABOLIC PANEL WITH GFR
ALT: 68 U/L — ABNORMAL HIGH (ref 0–44)
AST: 65 U/L — ABNORMAL HIGH (ref 15–41)
Albumin: 2.4 g/dL — ABNORMAL LOW (ref 3.5–5.0)
Alkaline Phosphatase: 51 U/L (ref 38–126)
Anion gap: 9 (ref 5–15)
BUN: 15 mg/dL (ref 8–23)
CO2: 24 mmol/L (ref 22–32)
Calcium: 8.4 mg/dL — ABNORMAL LOW (ref 8.9–10.3)
Chloride: 98 mmol/L (ref 98–111)
Creatinine, Ser: 0.93 mg/dL (ref 0.61–1.24)
GFR, Estimated: >60 mL/min (ref >60)
Glucose, Bld: 190 mg/dL — ABNORMAL HIGH (ref 70–99)
Potassium: 4.1 mmol/L (ref 3.5–5.1)
Sodium: 131 mmol/L — ABNORMAL LOW (ref 135–145)
Total Bilirubin: 0.5 mg/dL (ref 0.0–1.2)
Total Protein: 5.5 g/dL — ABNORMAL LOW (ref 6.5–8.1)

## 2024-08-08 LAB — CBC WITH DIFFERENTIAL/PLATELET
Abs Immature Granulocytes: 0 K/uL (ref 0.00–0.07)
Basophils Absolute: 0 K/uL (ref 0.0–0.1)
Basophils Relative: 1 %
Eosinophils Absolute: 0 K/uL (ref 0.0–0.5)
Eosinophils Relative: 0 %
HCT: 22 % — ABNORMAL LOW (ref 39.0–52.0)
Hemoglobin: 7.7 g/dL — ABNORMAL LOW (ref 13.0–17.0)
Immature Granulocytes: 0 %
Lymphocytes Relative: 33 %
Lymphs Abs: 0.3 K/uL — ABNORMAL LOW (ref 0.7–4.0)
MCH: 30.3 pg (ref 26.0–34.0)
MCHC: 35 g/dL (ref 30.0–36.0)
MCV: 86.6 fL (ref 80.0–100.0)
Monocytes Absolute: 0 K/uL — ABNORMAL LOW (ref 0.1–1.0)
Monocytes Relative: 5 %
Neutro Abs: 0.5 K/uL — ABNORMAL LOW (ref 1.7–7.7)
Neutrophils Relative %: 61 %
Platelets: 39 K/uL — ABNORMAL LOW (ref 150–400)
RBC: 2.54 MIL/uL — ABNORMAL LOW (ref 4.22–5.81)
RDW: 15 % (ref 11.5–15.5)
Smear Review: NORMAL
WBC: 0.8 K/uL — CL (ref 4.0–10.5)
nRBC: 0 % (ref 0.0–0.2)

## 2024-08-08 LAB — RAD ONC ARIA SESSION SUMMARY
Course Elapsed Days: 42
Plan Fractions Treated to Date: 30
Plan Prescribed Dose Per Fraction: 2 Gy
Plan Total Fractions Prescribed: 35
Plan Total Prescribed Dose: 70 Gy
Reference Point Dosage Given to Date: 60 Gy
Reference Point Session Dosage Given: 2 Gy
Session Number: 30

## 2024-08-08 LAB — STREP PNEUMONIAE URINARY ANTIGEN: Strep Pneumo Urinary Antigen: NEGATIVE

## 2024-08-08 LAB — EXPECTORATED SPUTUM ASSESSMENT W GRAM STAIN, RFLX TO RESP C

## 2024-08-08 LAB — GLUCOSE, CAPILLARY
Glucose-Capillary: 123 mg/dL — ABNORMAL HIGH (ref 70–99)
Glucose-Capillary: 124 mg/dL — ABNORMAL HIGH (ref 70–99)

## 2024-08-08 LAB — HEMOGLOBIN AND HEMATOCRIT, BLOOD
HCT: 23.2 % — ABNORMAL LOW (ref 39.0–52.0)
Hemoglobin: 8.5 g/dL — ABNORMAL LOW (ref 13.0–17.0)

## 2024-08-08 LAB — PHOSPHORUS
Phosphorus: 4.2 mg/dL (ref 2.5–4.6)
Phosphorus: 3 mg/dL (ref 2.5–4.6)

## 2024-08-08 LAB — MAGNESIUM
Magnesium: 2.2 mg/dL (ref 1.7–2.4)
Magnesium: 1.9 mg/dL (ref 1.7–2.4)

## 2024-08-08 LAB — PREPARE RBC (CROSSMATCH)

## 2024-08-08 MED ORDER — THIAMINE MONONITRATE 100 MG PO TABS
100.0000 mg | ORAL_TABLET | Freq: Every day | ORAL | Status: DC
  Administered 2024-08-08 – 2024-08-12 (×5): 100 mg
  Filled 2024-08-08 (×5): qty 1

## 2024-08-08 MED ORDER — FLUCONAZOLE 100 MG PO TABS
200.0000 mg | ORAL_TABLET | Freq: Once | ORAL | Status: AC
  Administered 2024-08-08: 200 mg via ORAL
  Filled 2024-08-08: qty 2

## 2024-08-08 MED ORDER — PROSOURCE TF20 ENFIT COMPATIBL EN LIQD
60.0000 mL | Freq: Every day | ENTERAL | Status: DC
  Administered 2024-08-08 – 2024-08-12 (×5): 60 mL
  Filled 2024-08-08 (×5): qty 60

## 2024-08-08 MED ORDER — FLUCONAZOLE IN SODIUM CHLORIDE 200-0.9 MG/100ML-% IV SOLN
200.0000 mg | Freq: Once | INTRAVENOUS | Status: AC
  Administered 2024-08-08: 200 mg via INTRAVENOUS
  Filled 2024-08-08: qty 100

## 2024-08-08 MED ORDER — OSMOLITE 1.5 CAL PO LIQD
1000.0000 mL | ORAL | Status: DC
  Administered 2024-08-08: 1000 mL
  Filled 2024-08-08: qty 1000

## 2024-08-08 MED ORDER — OSMOLITE 1.5 CAL PO LIQD
1000.0000 mL | ORAL | Status: DC
  Administered 2024-08-08 – 2024-08-12 (×4): 1000 mL
  Filled 2024-08-08 (×7): qty 1000

## 2024-08-08 MED ORDER — FLUCONAZOLE 100 MG PO TABS: 100.0000 mg | ORAL_TABLET | Freq: Every day | ORAL | Status: DC

## 2024-08-08 MED ORDER — LACTATED RINGERS IV SOLN: INTRAVENOUS | Status: DC

## 2024-08-08 NOTE — Plan of Care (Signed)
  Problem: Health Behavior/Discharge Planning: Goal: Ability to manage health-related needs will improve Outcome: Progressing   Problem: Clinical Measurements: Goal: Ability to maintain clinical measurements within normal limits will improve Outcome: Progressing Goal: Diagnostic test results will improve Outcome: Progressing Goal: Respiratory complications will improve Outcome: Progressing Goal: Cardiovascular complication will be avoided Outcome: Progressing   Problem: Activity: Goal: Risk for activity intolerance will decrease Outcome: Progressing   Problem: Nutrition: Goal: Adequate nutrition will be maintained Outcome: Progressing   Problem: Elimination: Goal: Will not experience complications related to urinary retention Outcome: Progressing

## 2024-08-08 NOTE — Progress Notes (Signed)
 PROGRESS NOTE    Gilbert Thompson  FMW:[id number] DOB: [birth year] DOA: 08/07/2024 PCP: Windy Coy, MD   Brief Narrative:  73 y.o. male with medical history significant of asthma, prostate cancer, CAD, GERD, hyperlipidemia, hypertension and primary squamous cell carcinoma of the oropharynx currently on radiation and chemotherapy (last chemotherapy 12 days prior to presentation) presented with weakness, fever and hypotension to the cancer center with blood pressure of 78/48.  In the ED, he was febrile, tachycardic.  COVID/influenza/RSV PCR negative.  WBC of 0.9, hemoglobin 8.4 and platelets of 41.  Sodium of 124 with normal renal function.  Lactic acid of 2.3.  Portable chest x-ray showed no acute findings.  CTA chest showed no pulmonary embolism but showed patchy airspace opacities suggestive of multilobar pneumonia.  He was started on IV fluids and antibiotics.  Oncology was consulted.  Assessment & Plan:   Severe sepsis: Present on admission Multifocal community-acquired bacterial pneumonia: Bacterial unspecified Neutropenic fever -Presented with fever, tachycardia, hypotension with pancytopenia and lactic acidosis possibly from multilobar pneumonia. -Imaging as above -Follow cultures.  Continue broad-spectrum antibiotics for now  Primary squamous cell carcinoma of oropharynx Pancytopenia - Patient currently undergoing radiation and chemotherapy (last chemotherapy was on 07/26/2024).  Oncology evaluation appreciated.  Continue filgrastim as per oncology. - Follow CBC: Labs pending for today - 1 unit packed red cell transfusion ordered on admission.  Monitor H&H. -resume tube feeding. Consult dietician  Hyponatremia - Treated with IV fluids on admission along with free water  restriction.  Sodium level pending this morning.  Monitor  Lactic acidosis - Resolved  Acute metabolic acidosis - Labs pending today.  Monitor  Hypomagnesemia -labs pending today.   Monitor  Hypophosphatemia -Labs pending today.  Monitor  Hypocalcemia - Labs pending today.  Monitor  Elevated LFTs - Questionable cause.  Labs pending today.  Monitor  CAD Hyperlipidemia - Stable.  No chest pain.  Continue ezetimibe .  Will hold aspirin  because of thrombocytopenia  BPH -Continue finasteride   Oral thrush - Continue nystatin.  Patient/daughter stated that Dr. Loyd oncology suggested Diflucan.  Will hold off on Diflucan for now because of slightly elevated LFTs: If LFTs do not trend upwards, might start Diflucan.  DVT prophylaxis: SCDs Code Status: Full Family Communication: Daughter at bedside Disposition Plan: Status is: Inpatient Remains inpatient appropriate because: Of severity of illness  Consultants: Oncology  Procedures: None  Antimicrobials: Cefepime and Zithromax from 08/07/2024 onwards.  Received a dose of Flagyl and vancomycin on admission as well   Subjective: Patient seen and examined at bedside.  Feels weak and tired but feels slightly better.  No overnight fever, vomiting, worsening abdominal pain reported.  Objective: Vitals:   08/08/24 0300 08/08/24 0400 08/08/24 0426 08/08/24 0500  BP: 134/63 116/87 130/63 134/65  Pulse: 81 70 76 85  Resp: 19 14 17 16   Temp:   (!) 97.4 F (36.3 C)   TempSrc:   Oral   SpO2: 95% 96% 96% 98%  Weight:      Height:        Intake/Output Summary (Last 24 hours) at 08/08/2024 9367 Last data filed at 08/08/2024 0435 Gross per 24 hour  Intake 7930.63 ml  Output 1025 ml  Net 6905.63 ml   Filed Weights   08/07/24 2035  Weight: 82.4 kg    Examination:  General exam: Appears calm and comfortable  Respiratory system: Bilateral decreased breath sounds at bases, with scattered crackles Cardiovascular system: S1 & S2 heard, Rate controlled Gastrointestinal system: Abdomen is nondistended,  soft and nontender. Normal bowel sounds heard.  Gastrostomy tube present. Extremities: No cyanosis,  clubbing, edema  Central nervous system: Alert and oriented. No focal neurological deficits. Moving extremities Skin: No rashes, lesions or ulcers Psychiatry: Flat affect.  Not agitated   Data Reviewed: I have personally reviewed following labs and imaging studies  CBC: Recent Labs  Lab 08/02/24 0903 08/07/24 1035 08/07/24 2142  WBC 1.1* 0.9*  --   NEUTROABS 0.9* 0.4*  --   HGB 10.6* 8.4* 6.9*  HCT 29.3* 23.6* 19.2*  MCV 86.9 89.4  --   PLT 52* 41*  --    Basic Metabolic Panel: Recent Labs  Lab 08/02/24 0903 08/07/24 1035 08/07/24 1506  NA 129* 124* 127*  K 3.1* 4.2 3.8  CL 95* 90* 98  CO2 27 25 14*  GLUCOSE 144* 147* 81  BUN 21 15 9   CREATININE 1.11 1.21 0.48*  CALCIUM  8.4* 8.6* 6.7*  MG 1.2*  --  0.7*  PHOS  --   --  <1.0*   GFR: Estimated Creatinine Clearance: 81 mL/min (A) (by C-G formula based on SCr of 0.48 mg/dL (L)). Liver Function Tests: Recent Labs  Lab 08/07/24 1035  AST 72*  ALT 77*  ALKPHOS 64  BILITOT 0.6  PROT 6.2*  ALBUMIN 2.9*   No results for input(s): LIPASE, AMYLASE in the last 168 hours. No results for input(s): AMMONIA in the last 168 hours. Coagulation Profile: Recent Labs  Lab 08/07/24 1037  INR 1.1   Cardiac Enzymes: No results for input(s): CKTOTAL, CKMB, CKMBINDEX, TROPONINI in the last 168 hours. BNP (last 3 results) No results for input(s): PROBNP in the last 8760 hours. HbA1C: No results for input(s): HGBA1C in the last 72 hours. CBG: No results for input(s): GLUCAP in the last 168 hours. Lipid Profile: No results for input(s): CHOL, HDL, LDLCALC, TRIG, CHOLHDL, LDLDIRECT in the last 72 hours. Thyroid  Function Tests: No results for input(s): TSH, T4TOTAL, FREET4, T3FREE, THYROIDAB in the last 72 hours. Anemia Panel: No results for input(s): VITAMINB12, FOLATE, FERRITIN, TIBC, IRON, RETICCTPCT in the last 72 hours. Sepsis Labs: Recent Labs  Lab 08/07/24 1400  08/07/24 1416 08/07/24 1509 08/07/24 2142  PROCALCITON  --   --   --  4.47  LATICACIDVEN 4.0* 7.2* 6.4* 1.3    Recent Results (from the past 240 hours)  Blood Culture (routine x 2)     Status: None (Preliminary result)   Collection Time: 08/07/24 10:36 AM   Specimen: BLOOD  Result Value Ref Range Status   Specimen Description   Final    BLOOD RIGHT ANTECUBITAL Performed at [facility], [address], [place], [place] [address]    Special Requests   Final    BOTTLES DRAWN AEROBIC AND ANAEROBIC Blood Culture results may not be optimal due to an inadequate volume of blood received in culture bottles Performed at [facility], [address] [place], [place] [address]    Culture   Final    NO GROWTH < 12 HOURS Performed at [facility], [address] [place], [place] [address]    Report Status PENDING  Incomplete  Blood Culture (routine x 2)     Status: None (Preliminary result)   Collection Time: 08/07/24 10:36 AM   Specimen: BLOOD  Result Value Ref Range Status   Specimen Description   Final    BLOOD BLOOD RIGHT FOREARM Performed at [facility], [address] [place], [place]  [address]    Special Requests   Final    BOTTLES DRAWN AEROBIC ONLY Blood Culture results may not be optimal due to an inadequate volume of blood received in culture bottles Performed at [facility], [address], [place], [place] [address]    Culture   Final    NO GROWTH < 12 HOURS Performed at [facility], [address] [place], [place] [address]    Report Status PENDING  Incomplete  Resp panel by RT-PCR (RSV, Flu A&B, Covid) Anterior Nasal Swab     Status: None   Collection Time: 08/07/24 11:40 AM   Specimen: Anterior Nasal Swab  Result Value Ref Range Status   SARS Coronavirus 2 by RT PCR NEGATIVE NEGATIVE Final    Comment: (NOTE) SARS-CoV-2 target nucleic acids are NOT DETECTED.  The  SARS-CoV-2 RNA is generally detectable in upper respiratory specimens during the acute phase of infection. The lowest concentration of SARS-CoV-2 viral copies this assay can detect is 138 copies/mL. A negative result does not preclude SARS-Cov-2 infection and should not be used as the sole basis for treatment or other patient management decisions. A negative result may occur with  improper specimen collection/handling, submission of specimen other than nasopharyngeal swab, presence of viral mutation(s) within the areas targeted by this assay, and inadequate number of viral copies(<138 copies/mL). A negative result must be combined with clinical observations, patient history, and epidemiological information. The expected result is Negative.  Fact Sheet for Patients:  bloggercourse.com  Fact Sheet for Healthcare Providers:  seriousbroker.it  This test is no t yet approved or cleared by the [place]  FDA and  has been authorized for detection and/or diagnosis of SARS-CoV-2 by FDA under an Emergency Use Authorization (EUA). This EUA will remain  in effect (meaning this test can be used) for the duration of the COVID-19 declaration under Section 564(b)(1) of the Act, 21 U.S.C.section 360bbb-3(b)(1), unless the authorization is terminated  or revoked sooner.       Influenza A by PCR NEGATIVE NEGATIVE Final   Influenza B by PCR NEGATIVE NEGATIVE Final    Comment: (NOTE) The Xpert Xpress SARS-CoV-2/FLU/RSV plus assay is intended as an aid in the diagnosis of influenza from Nasopharyngeal swab specimens and should not be used as a sole basis for treatment. Nasal washings and aspirates are unacceptable for Xpert Xpress SARS-CoV-2/FLU/RSV testing.  Fact Sheet for Patients: bloggercourse.com  Fact Sheet for Healthcare Providers: seriousbroker.it  This test is not yet approved or  cleared by the [place]  FDA and has been authorized for detection and/or diagnosis of SARS-CoV-2 by FDA under an Emergency Use Authorization (EUA). This EUA will remain in effect (meaning this test can be used) for the duration of the COVID-19 declaration under Section 564(b)(1) of the Act, 21 U.S.C. section 360bbb-3(b)(1), unless the authorization is terminated or revoked.     Resp Syncytial Virus by PCR NEGATIVE NEGATIVE Final    Comment: (NOTE) Fact Sheet for Patients: bloggercourse.com  Fact Sheet for Healthcare Providers: seriousbroker.it  This test is not yet approved or cleared by the [place]  FDA and has been authorized for detection and/or diagnosis of SARS-CoV-2 by FDA under an Emergency Use Authorization (EUA). This EUA will remain in effect (meaning this test can be used) for the duration of the COVID-19 declaration under Section 564(b)(1) of the Act, 21 U.S.C. section 360bbb-3(b)(1), unless the authorization is terminated or revoked.  Performed at [facility], [address] [place],  [place] [address]   MRSA Next Gen by PCR, Nasal     Status: None   Collection Time: 08/07/24  8:31 PM   Specimen: Nasal Mucosa; Nasal Swab  Result Value Ref Range Status   MRSA by PCR Next Gen NOT DETECTED NOT DETECTED Final    Comment: (NOTE) The GeneXpert MRSA Assay (FDA approved for NASAL specimens only), is one component of a comprehensive MRSA colonization surveillance program. It is not intended to diagnose MRSA infection nor to guide or monitor treatment for MRSA infections. Test performance is not FDA approved in patients less than 90 years old. Performed at [facility], [address], [place], [place] [address]   Expectorated Sputum Assessment w Gram Stain, Rflx to Resp Cult     Status: None   Collection Time: 08/08/24  2:25 AM   Specimen: Expectorated Sputum  Result  Value Ref Range Status   Specimen Description EXPECTORATED SPUTUM  Final   Special Requests NONE  Final   Sputum evaluation   Final    Sputum specimen not acceptable for testing.  Please recollect.   NOTIFIED MATHEW HERO RN OF RECOLLECT @ [phone] ON 08/08/2024 BY MTA Performed at [facility], [address], [place], [place] [address]    Report Status 08/08/2024 FINAL  Final         Radiology Studies: CT Angio Chest PE W/Cm &/Or Wo Cm Result Date: 08/07/2024 EXAM: CTA CHEST 08/07/2024 01:30:55 PM TECHNIQUE: CTA of the chest was performed after the administration of intravenous contrast. Multiplanar reformatted images are provided for review. MIP images are provided for review. Automated exposure control, iterative reconstruction, and/or weight based adjustment of the mA/kV was utilized to reduce the radiation dose to as low as reasonably achievable. COMPARISON: None available. 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 * Tracking Code: BO * right port a cath tip colon SVC. FINDINGS: PULMONARY ARTERIES: Pulmonary arteries are adequately opacified for evaluation. No acute pulmonary embolus. Main pulmonary artery is normal in caliber. MEDIASTINUM: Mild cardiomegaly. Thoracic aortic, coronary artery, and branch vessel atheromatous vascular calcifications. There is no acute abnormality of the thoracic aorta. LYMPH NODES: Right high lung node 1.3 cm in short axis on image 62 series 5, formerly 0.7 cm. No mediastinal or axillary lymphadenopathy. LUNGS AND PLEURA: Bilateral airway thickening noted. Patchy and nodular regions of airspace opacity in the right upper lobe, right middle lobe, and confluent in both lower lobes especially dependently raising the possibility of multilobar pneumonia. There is also some faint ground glass opacity posteriorly in the lingula. No evidence  of pleural effusion or pneumothorax. UPPER ABDOMEN: Limited images of the upper abdomen are unremarkable. Peg tube noted. SOFT TISSUES AND BONES: Thoracic spondylosis. No acute bone or soft tissue abnormality. IMPRESSION: 1. No pulmonary embolism. 2. Patchy airspace opacities, especially in the lower lobes, but also in the right upper lobe and right middle lobe potentially from atypical or multilobar pneumonia. 3. Enlarged right high lung lymph node measuring 1.3 cm short axis, increased from 0.7 cm. This is likely reactive given the findings in the lung, although surveillance is suggested. 4. Mild cardiomegaly. 5. Thoracic aortic and coronary artery atherosclerosis. 6. Bilateral airway thickening. 7. Thoracic spondylosis. Electronically signed by: Ryan Salvage MD 08/07/2024 02:26 PM EST RP Workstation: [id number]   DG Chest Port 1 View Result Date: 08/07/2024 CLINICAL DATA:  Questionable sepsis. Head and neck cancer, hypotension with  decreased O2 sats. EXAM: PORTABLE CHEST 1 VIEW COMPARISON:  09/28/2022.  CT chest 04/06/2024. FINDINGS: Right IJ Port-A-Cath tip is at the SVC RA junction. Lungs are clear. No pleural fluid. IMPRESSION: No acute findings. Electronically Signed   By: Newell Eke M.D.   On: 08/07/2024 10:59        Scheduled Meds:  allopurinol   300 mg Oral Daily   aspirin  EC  81 mg Oral Daily   budesonide-glycopyrrolate-formoterol  2 puff Inhalation BID   Chlorhexidine  Gluconate Cloth  6 each Topical Daily   ezetimibe   10 mg Oral Daily   feeding supplement  237 mL Oral TID BM   filgrastim (NIVESTYM) SQ  300 mcg Subcutaneous Q1200   finasteride   5 mg Oral Daily   levalbuterol  0.63 mg Nebulization TID   morphine   30 mg Oral Q12H   nystatin  5 mL Oral QID   mouth rinse  15 mL Mouth Rinse 4 times per day   pantoprazole  (PROTONIX ) IV  40 mg Intravenous Q12H   Continuous Infusions:  azithromycin Stopped (08/07/24 1745)   ceFEPime (MAXIPIME) IV Stopped (08/07/24 2229)           Sophie Mao, MD [facility] 08/08/2024, 6:32 AM

## 2024-08-08 NOTE — Progress Notes (Signed)
 Initial Nutrition Assessment  DOCUMENTATION CODES:   Severe malnutrition in context of acute illness/injury  INTERVENTION:  - Initiate tube feeding via G-tube: Osmolite 1.5 at 65 ml/h (1560 ml per day) *Start at 41mL/hr and advance by 10mL Q12H  Prosource TF20 60 ml daily Provides 2420 kcal, 118 gm protein, 1189 ml free water  daily  - Monitor magnesium , potassium, and phosphorus daily for at least 3 days, MD to replete as needed, as pt is at risk for refeeding syndrome given malnutrition with significant weight loss and inadequate oral intake. - Add 100mg  thiamine x7 days due to refeeding risk.  - FWF per MD. Consider 175mL Q4H ( ) once hyponatremia improves.  - In addition to TF, would provide a total of 2211mL/day.  - Monitor weight trends.    NUTRITION DIAGNOSIS:   Severe Malnutrition related to acute illness as evidenced by mild fat depletion, mild muscle depletion, energy intake < or equal to 50% for > or equal to 5 days, percent weight loss (7.5% in 1.5 months).  GOAL:   Patient will meet greater than or equal to 90% of their needs  MONITOR:   Labs, Weight trends, TF tolerance  REASON FOR ASSESSMENT:   Consult Enteral/tube feeding initiation and management  ASSESSMENT:   73 y.o. male with PMH significant for primary squamous cell carcinoma of the oropharynx currently on radiation and chemotherapy, asthma, Hx prostate cancer, CAD, GERD, HLD, HTN who presented with weakness, fever and hypotension to the cancer center. Admitted for sepsis.   11/17 Admit 11/18 SLP eval -> rec'd NPO  Met with patient and wife at bedside to obtain nutrition history.   UBW reported to be 185# and both endorse he has lost weight over the past several weeks. Per EMR, patient weighed at 185# on 9/29 and weighed on 11/11 at 171#. This is a 14# or 7.5% weight loss in 1.5 months, which is significant for the time frame.  Wife reports the patient was previously eating orally well enough  that he wasn't really using his G-tube (which was placed on 10/3). Was eating full meals and taking in some ONS (Ensure, Boost VHC) as well. Since the end of October, he stopped eating solid food and was mostly doing liquids or very soft foods like pudding and yogurt.  However, wife and patient report that over the past 2 weeks he has not been able to eat barely anything orally and so the started using his G-tube. For the past two weeks, he was getting 2 cartons of Osmolite 1.5 (or Boost VHC) and at once point got up to 3 cartons. Was reportedly tolerating but patient reports he would feel uncomfortably full afterwards.  Patient had SLP eval today and recommended NPO due to concern for aspiration pneumonia. Plan to start tube feeds today via G-tube.  Patient concerned about volume of bolus tube feeds (will need 7 cartons/day) due to reported fullness with only 2-3 cartons a day. Dicussed can trial continuous TF for now to get up to goal and meeting nutritional needs. Can plan to transition to bolus tube feeds at later time. Patient very happy with this plan and wife in agreement.    Medications reviewed and include: Protonix   Labs reviewed:  Na 131  Latest Reference Range & Units 08/07/24 15:06 08/08/24 04:05  Potassium 3.5 - 5.1 mmol/L 3.8 4.1  Phosphorus 2.5 - 4.6 mg/dL <8.9 (LL) 4.2  Magnesium  1.7 - 2.4 mg/dL 0.7 (LL) 2.2  (LL): Data is critically low (L): Data is  abnormally low (H): Data is abnormally high  NUTRITION - FOCUSED PHYSICAL EXAM:  Flowsheet Row Most Recent Value  Orbital Region Mild depletion  Upper Arm Region Mild depletion  Thoracic and Lumbar Region No depletion  Buccal Region No depletion  Temple Region Mild depletion  Clavicle Bone Region No depletion  Clavicle and Acromion Bone Region Mild depletion  Scapular Bone Region Unable to assess  Dorsal Hand No depletion  Patellar Region No depletion  Anterior Thigh Region No depletion  Posterior Calf Region No  depletion  Edema (RD Assessment) None  Hair Reviewed  Eyes Reviewed  Mouth Reviewed  Skin Reviewed  Nails Reviewed    Diet Order:   Diet Order             Diet NPO time specified Except for: Sips with Meds, Ice Chips  Diet effective now                   EDUCATION NEEDS:  Education needs have been addressed  Skin:  Skin Assessment: Reviewed RN Assessment  Last BM:  11/17  Height:  Ht Readings from Last 1 Encounters:  08/07/24 5' 9 (1.753 m)   Weight:  Wt Readings from Last 1 Encounters:  08/07/24 82.4 kg    BMI:  Body mass index is 26.83 kg/m.  Estimated Nutritional Needs:  Kcal:  2200-2450 kcals Protein:  100-125 grams Fluid:  >/= 2.2L    Trude Ned RD, LDN Contact via Secure Chat.

## 2024-08-08 NOTE — Progress Notes (Addendum)
 ADDENDUM:  Patient was personally and independently interviewed, examined and relevant elements of the history of present illness were reviewed in details and an assessment and plan was created. All elements of the patient's history of present illness, assessment and plan were discussed in detail with Olam JINNY Brunner, NP. The above documentation reflects our combined findings assessment and plan.   Briefly, 73 y.o. gentleman who is well-known to our service for his primary squamous cell carcinoma of the oropharynx, stage I disease with extranodal extension, currently received concurrent chemoradiation, last chemotherapy on 07/26/2024, further chemotherapy held because of neutropenia despite G-CSF support.  He presented to the clinic on 08/07/2024 for IV fluids that he has been receiving at least 3 days a week.  He was noted to be hypotensive and given generalized weakness, subjective fevers, he was directed to the ED for further evaluation.  Clinical picture concerning for sepsis, likely from pneumonia of right upper and middle lobes, in the setting of neutropenia.   Subjectively he is feeling a lot better today.  We continued Zarxio injections with plan to continue it until [facility] is above 1000.  He is on broad-spectrum antibiotics.  Continue to monitor CBCD daily and transfuse per intermittent hemoglobin closer to 8.  Goal is to maintain platelet count about 20,000.  Lactic acid levels improved compared to the time of admission.  He will continue to receive radiation treatments daily.  No further chemotherapy planned.  Will continue to follow.  Please call with any questions.  Gilbert Thompson   DOB:05/28/1951   FM#:[id number]      ASSESSMENT & PLAN:  Gilbert Thompson is a 73 year old male patient admitted on 08/07/2024 with complaints of neutropenic fever, pneumonia, weakness and hypotension.  Oncologic history significant for oropharyngeal carcinoma.  Medical oncology following.  Neutropenic  fever Generalized weakness Cancer related pain - Secondary to recent chemotherapy - Continue pain medication as ordered.  Patient reports that pain meds are working. - Monitor fever curve, afebrile at this time. - Continue supportive care  Chemotherapy-induced pancytopenia Leukopenia/neutropenia - WBC low 0.8.  ANC 0.5 - Patient has previously received Zarzio x 3 days.  Now on G-CSF/Nivestim x 4 days beginning today. Anemia - Hemoglobin low 7.7 - Recommend PRBC transfusion for hemoglobin <7.0 Thrombocytopenia - Platelets low 39K - Recommend transfusion for platelets <20 K or <50 K with bleeding -Continue to monitor CBC with differential closely  Primary squamous cell carcinoma of oropharynx,  cT2,cN1,cM0,p16+, Stage 1  - Diagnosed 04/2024. Invasive squamous cell carcinoma.  - Last chemotherapy with cisplatin cycle 5 was on 07/26/2024. Further chemotherapy was withheld because of neutropenia and has been receiving Zarxio support in our clinic  -- We will hold further chemotherapy.  - Currently undergoing radiation therapy. Radiation therapy to be completed as planned.  - Medical oncology/Dr. Bacilio Abascal managing  Lactic acidosis -- Elevated lactic acid levels, likely secondary to infection.  -- Continue close monitoring to assess response to treatment.  History of prostate adenocarcinoma - Diagnosed 04/29/2023 - Status post radical prostatectomy   Code Status Full   Subjective:  Patient seen awake and alert laying in bed in ICU.  States that he is feeling a lot better and pain meds are working.  Family member at bedside.  No acute complaints offered.      Objective:   Intake/Output Summary (Last 24 hours) at 08/08/2024 1004 Last data filed at 08/08/2024 0945 Gross per 24 hour  Intake 7930.63 ml  Output 1225 ml  Net 6705.63 ml     PHYSICAL EXAMINATION: ECOG PERFORMANCE STATUS: 3 - Symptomatic, >50% confined to bed  Vitals:   08/08/24 0700 08/08/24 0725  BP: 133/63    Pulse: 77   Resp: 15   Temp:  (!) 97.5 F (36.4 C)  SpO2: 95%    Filed Weights   08/07/24 2035  Weight: 181 lb 10.5 oz (82.4 kg)    GENERAL: alert, no distress and comfortable +chronically ill-appearing SKIN: + discoloration skin color around neck/throat s/p RT EYES: normal, conjunctiva are pink and non-injected, sclera clear OROPHARYNX: no exudate, no erythema and lips, buccal mucosa, and tongue normal  NECK: supple, thyroid  normal size, non-tender, without nodularity LYMPH: no palpable lymphadenopathy in the cervical, axillary or inguinal LUNGS: clear to auscultation and percussion with normal breathing effort HEART: regular rate & rhythm and no murmurs and no lower extremity edema ABDOMEN: abdomen soft, non-tender and normal bowel sounds MUSCULOSKELETAL: no cyanosis of digits and no clubbing  PSYCH: alert & oriented x 3 with fluent speech NEURO: no focal motor/sensory deficits   All questions were answered. The patient knows to call the clinic with any problems, questions or concerns.   The total time spent in the appointment was 40 minutes encounter with patient including review of chart and various tests results, discussions about plan of care and coordination of care plan  Olam JINNY Brunner, NP 08/08/2024 10:04 AM    Labs Reviewed:  Lab Results  Component Value Date   WBC 0.8 (LL) 08/08/2024   HGB 8.5 (L) 08/08/2024   HCT 23.2 (L) 08/08/2024   MCV 86.6 08/08/2024   PLT 39 (L) 08/08/2024   Recent Labs    06/07/24 1402 06/14/24 1502 08/07/24 1035 08/07/24 1506 08/08/24 0405  NA 136   < > 124* 127* 131*  K 4.0   < > 4.2 3.8 4.1  CL 102   < > 90* 98 98  CO2 28   < > 25 14* 24  GLUCOSE 108*   < > 147* 81 190*  BUN 21   < > 15 9 15   CREATININE 1.48*   < > 1.21 0.48* 0.93  CALCIUM  9.7   < > 8.6* 6.7* 8.4*  GFRNONAA 49*   < > >60 >60 >60  PROT 7.3  --  6.2*  --  5.5*  ALBUMIN 4.3  --  2.9*  --  2.4*  AST 26  --  72*  --  65*  ALT 17  --  77*  --  68*   ALKPHOS 61  --  64  --  51  BILITOT 0.6  --  0.6  --  0.5   < > = values in this interval not displayed.    Studies Reviewed:  CT Angio Chest PE W/Cm &/Or Wo Cm Result Date: 08/07/2024 EXAM: CTA CHEST 08/07/2024 01:30:55 PM TECHNIQUE: CTA of the chest was performed after the administration of intravenous contrast. Multiplanar reformatted images are provided for review. MIP images are provided for review. Automated exposure control, iterative reconstruction, and/or weight based adjustment of the mA/kV was utilized to reduce the radiation dose to as low as reasonably achievable. COMPARISON: None available. 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 * Tracking Code: BO * right port a cath tip colon SVC. FINDINGS: PULMONARY ARTERIES: Pulmonary arteries are adequately opacified for evaluation. No acute pulmonary embolus. Main pulmonary artery is normal in caliber. MEDIASTINUM:  Mild cardiomegaly. Thoracic aortic, coronary artery, and branch vessel atheromatous vascular calcifications. There is no acute abnormality of the thoracic aorta. LYMPH NODES: Right high lung node 1.3 cm in short axis on image 62 series 5, formerly 0.7 cm. No mediastinal or axillary lymphadenopathy. LUNGS AND PLEURA: Bilateral airway thickening noted. Patchy and nodular regions of airspace opacity in the right upper lobe, right middle lobe, and confluent in both lower lobes especially dependently raising the possibility of multilobar pneumonia. There is also some faint ground glass opacity posteriorly in the lingula. No evidence of pleural effusion or pneumothorax. UPPER ABDOMEN: Limited images of the upper abdomen are unremarkable. Peg tube noted. SOFT TISSUES AND BONES: Thoracic spondylosis. No acute bone or soft tissue abnormality. IMPRESSION: 1. No pulmonary embolism. 2. Patchy airspace opacities, especially in the lower  lobes, but also in the right upper lobe and right middle lobe potentially from atypical or multilobar pneumonia. 3. Enlarged right high lung lymph node measuring 1.3 cm short axis, increased from 0.7 cm. This is likely reactive given the findings in the lung, although surveillance is suggested. 4. Mild cardiomegaly. 5. Thoracic aortic and coronary artery atherosclerosis. 6. Bilateral airway thickening. 7. Thoracic spondylosis. Electronically signed by: Ryan Salvage MD 08/07/2024 02:26 PM EST RP Workstation: [id number]   DG Chest Port 1 View Result Date: 08/07/2024 CLINICAL DATA:  Questionable sepsis. Head and neck cancer, hypotension with decreased O2 sats. EXAM: PORTABLE CHEST 1 VIEW COMPARISON:  09/28/2022.  CT chest 04/06/2024. FINDINGS: Right IJ Port-A-Cath tip is at the SVC RA junction. Lungs are clear. No pleural fluid. IMPRESSION: No acute findings. Electronically Signed   By: Newell Eke M.D.   On: 08/07/2024 10:59

## 2024-08-08 NOTE — Evaluation (Addendum)
 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, follwed by [facility]   Dyspnea    with exertion   GERD (gastroesophageal reflux disease)    Hyperlipidemia    Hypertension    Pneumonia    Past Surgical History:  Past Surgical History:  Procedure Laterality Date   CATARACT EXTRACTION Bilateral    IR GASTROSTOMY TUBE MOD SED  06/23/2024   IR IMAGING GUIDED PORT INSERTION  06/23/2024   LEFT HEART CATH AND CORONARY ANGIOGRAPHY N/A 06/05/2022   Procedure: LEFT HEART CATH AND CORONARY ANGIOGRAPHY;  Surgeon: Claudene Victory ORN, MD;  Location: [facility] INVASIVE [facility];  Service: Cardiovascular;  Laterality: N/A;   LYMPHADENECTOMY Bilateral 04/29/2023   Procedure: BILATERAL PELVIC LYMPHADENECTOMY;  Surgeon: Renda Glance, MD;  Location: [facility] ORS;  Service: Urology;  Laterality: Bilateral;   PROSTATE BIOPSY     ROBOT ASSISTED LAPAROSCOPIC RADICAL PROSTATECTOMY N/A 04/29/2023   Procedure: XI ROBOTIC ASSISTED LAPAROSCOPIC RADICAL PROSTATECTOMY LEVEL 2;  Surgeon: Renda Glance, MD;  Location: [facility] ORS;  Service: Urology;  Laterality: N/A;  210 MINUTES NEEDED FOR CASE   UPPER GASTROINTESTINAL ENDOSCOPY     VENTRAL HERNIA REPAIR N/A 10/05/2023   Procedure: LAPAROSCOPIC VENTRAL HERNIA REPAAIR WITH MESH;  Surgeon: Rubin Calamity, MD;  Location: [facility] OR;  Service: General;  Laterality: N/A;   HPI:  pt is a 73 yo male adm to [facility] with hypotension, poor intake.  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 He is undergoing concurrent chemoradiation. Last chemotherapy with cisplatin cycle 5 was on 07/26/2024. Further chemotherapy was withheld because of neutropenia and has been receiving Zarxio support in our clinic. Pt received a PEG tube for nutrition and reports having consumed po and using feeding tube for nutrition. 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  He admits odynophagia and dysphagia are equal parts responsible for his poor intake. Pt CXR 08/07/2024: Patchy airspace opacities, especially in the lower lobes, but also in the right upper lobe and right middle lobe potentially from atypical or multilobar pneumonia.    Assessment / Plan / Recommendation  Clinical Impression  Mr Gilbert Thompson currently has oral candidiasis, limited lingual ROM due to his tongue base cancer and dysphagia due from iatrogenic effects of chemoradiation.  His voice is hoarse - which he reports has worsened since his cancer diagnosis and treatment.  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  He has been using his feeding tube for nutrition - and having some po intake - but within the last few days - has solely relied on tube feeding.    At this time, recommend he continue po of ice chips and sips of water  only after  mouth care and receive nutrition and medications via his PEG.   Given his Right lung involvement, concern for aspiration pna is present - Would advise to conduct MBS prior to pt discharging hospital due to multiple risk factors for dysphagia and aspiration PNA.  Pt agreeable currently for PO ice and water  in small amounts to decrease disuse muscle atrophy.  Will follow up for MBS readiness.    Messaged RD re: pt needing nutritional  support via PEG tube, and she returned message back.    Aspiration Risk  Severe - moderate aspiration risk    Diet Recommendation NPO;Ice chips PRN after oral care;Free water  protocol after oral care    Liquid Administration via: Cup;Straw Medication Administration: Via alternative means Supervision: Full supervision/cueing for compensatory strategies Compensations: Slow rate;Small sips/bites Postural Changes: Seated upright at 90 degrees;Remain upright for at least 30 minutes after po intake    Other  Recommendations Oral Care Recommendations: Oral care prior to ice chip/H20     Assistance Recommended at Discharge  N/a  Functional Status Assessment Patient has had a recent decline in their functional status and demonstrates the ability to make significant improvements in function in a reasonable and predictable amount of time.  Frequency and Duration min 1 x/week  1 week       Prognosis Prognosis for improved oropharyngeal function: Fair Barriers to Reach Goals: Time post onset      Swallow Study   General Date of Onset: 08/08/24 HPI: pt is a 73 yo male adm to [facility] with hypotension, poor intake.  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 He is undergoing concurrent chemoradiation. Last chemotherapy with cisplatin cycle 5 was on 07/26/2024. Further chemotherapy was withheld because of neutropenia and has been receiving Zarxio support in our clinic. Pt received a PEG tube for nutrition and reports having consumed po and using feeding tube for nutrition. 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  He admits odynophagia and dysphagia are equal parts responsible for his poor intake. Pt CXR 08/07/2024: Patchy airspace  opacities, especially in the lower lobes, but also in the right upper lobe and right middle lobe potentially from atypical or multilobar pneumonia. Type of Study: Bedside Swallow Evaluation Diet Prior to this Study: NPO Temperature Spikes Noted: No Respiratory Status: Room air History of Recent Intubation: No Behavior/Cognition: Alert;Cooperative;Pleasant mood Oral Cavity Assessment: Lesions Oral Care Completed by SLP: No Oral Cavity - Dentition: Adequate natural dentition Vision: Functional for self-feeding Self-Feeding Abilities: Able to feed self Patient Positioning: Upright in bed Baseline Vocal Quality: Normal Volitional Cough: Weak Volitional Swallow: Able to elicit    Oral/Motor/Sensory Function Overall Oral Motor/Sensory Function: Other (comment) (decreased ROM of tongue on left due to his cancer)   Ice Chips Ice chips: Impaired Presentation: Self Fed Pharyngeal Phase Impairments: Multiple swallows   Thin Liquid Thin Liquid: Not tested    Nectar Thick Nectar Thick Liquid: Not tested   Honey Thick Honey Thick Liquid: Not tested   Puree Puree: Not tested   Solid     Solid: Not tested      [name] 08/08/2024,3:00 PM [name], MS [facility] SLP Acute Rehab Services Office [phone]

## 2024-08-09 ENCOUNTER — Inpatient Hospital Stay

## 2024-08-09 ENCOUNTER — Other Ambulatory Visit (HOSPITAL_COMMUNITY): Payer: Self-pay

## 2024-08-09 ENCOUNTER — Other Ambulatory Visit: Payer: Self-pay

## 2024-08-09 ENCOUNTER — Inpatient Hospital Stay: Admitting: Dietician

## 2024-08-09 ENCOUNTER — Telehealth (HOSPITAL_COMMUNITY): Payer: Self-pay | Admitting: Pharmacy Technician

## 2024-08-09 ENCOUNTER — Inpatient Hospital Stay (HOSPITAL_COMMUNITY)

## 2024-08-09 ENCOUNTER — Ambulatory Visit

## 2024-08-09 ENCOUNTER — Inpatient Hospital Stay: Admitting: Oncology

## 2024-08-09 DIAGNOSIS — A419 Sepsis, unspecified organism: Secondary | ICD-10-CM | POA: Diagnosis not present

## 2024-08-09 DIAGNOSIS — B370 Candidal stomatitis: Secondary | ICD-10-CM

## 2024-08-09 DIAGNOSIS — D709 Neutropenia, unspecified: Secondary | ICD-10-CM | POA: Diagnosis not present

## 2024-08-09 DIAGNOSIS — I2510 Atherosclerotic heart disease of native coronary artery without angina pectoris: Secondary | ICD-10-CM

## 2024-08-09 DIAGNOSIS — R7401 Elevation of levels of liver transaminase levels: Secondary | ICD-10-CM

## 2024-08-09 DIAGNOSIS — D61818 Other pancytopenia: Secondary | ICD-10-CM

## 2024-08-09 DIAGNOSIS — E871 Hypo-osmolality and hyponatremia: Secondary | ICD-10-CM | POA: Diagnosis not present

## 2024-08-09 DIAGNOSIS — E43 Unspecified severe protein-calorie malnutrition: Secondary | ICD-10-CM

## 2024-08-09 DIAGNOSIS — J188 Other pneumonia, unspecified organism: Secondary | ICD-10-CM

## 2024-08-09 DIAGNOSIS — C109 Malignant neoplasm of oropharynx, unspecified: Secondary | ICD-10-CM | POA: Diagnosis not present

## 2024-08-09 LAB — CBC WITH DIFFERENTIAL/PLATELET
Abs Immature Granulocytes: 0 K/uL (ref 0.00–0.07)
Band Neutrophils: 14 %
Basophils Absolute: 0 K/uL (ref 0.0–0.1)
Basophils Relative: 0 %
Eosinophils Absolute: 0 K/uL (ref 0.0–0.5)
Eosinophils Relative: 0 %
HCT: 25.3 % — ABNORMAL LOW (ref 39.0–52.0)
Hemoglobin: 8.9 g/dL — ABNORMAL LOW (ref 13.0–17.0)
Lymphocytes Relative: 22 %
Lymphs Abs: 0.3 K/uL — ABNORMAL LOW (ref 0.7–4.0)
MCH: 29.9 pg (ref 26.0–34.0)
MCHC: 35.2 g/dL (ref 30.0–36.0)
MCV: 84.9 fL (ref 80.0–100.0)
Metamyelocytes Relative: 1 %
Monocytes Absolute: 0 K/uL — ABNORMAL LOW (ref 0.1–1.0)
Monocytes Relative: 3 %
Neutro Abs: 1.1 K/uL — ABNORMAL LOW (ref 1.7–7.7)
Neutrophils Relative %: 60 %
Platelets: 58 K/uL — ABNORMAL LOW (ref 150–400)
RBC: 2.98 MIL/uL — ABNORMAL LOW (ref 4.22–5.81)
RDW: 16 % — ABNORMAL HIGH (ref 11.5–15.5)
Smear Review: NORMAL
WBC: 1.5 K/uL — ABNORMAL LOW (ref 4.0–10.5)
nRBC: 0 % (ref 0.0–0.2)

## 2024-08-09 LAB — BPAM RBC
Blood Product Expiration Date: 202512192359
ISSUE DATE / TIME: 202511180233
Unit Type and Rh: 5100

## 2024-08-09 LAB — COMPREHENSIVE METABOLIC PANEL WITH GFR
ALT: 103 U/L — ABNORMAL HIGH (ref 0–44)
AST: 127 U/L — ABNORMAL HIGH (ref 15–41)
Albumin: 2.6 g/dL — ABNORMAL LOW (ref 3.5–5.0)
Alkaline Phosphatase: 57 U/L (ref 38–126)
Anion gap: 14 (ref 5–15)
BUN: 22 mg/dL (ref 8–23)
CO2: 21 mmol/L — ABNORMAL LOW (ref 22–32)
Calcium: 9.2 mg/dL (ref 8.9–10.3)
Chloride: 100 mmol/L (ref 98–111)
Creatinine, Ser: 1.09 mg/dL (ref 0.61–1.24)
GFR, Estimated: >60 mL/min (ref >60)
Glucose, Bld: 111 mg/dL — ABNORMAL HIGH (ref 70–99)
Potassium: 3.5 mmol/L (ref 3.5–5.1)
Sodium: 135 mmol/L (ref 135–145)
Total Bilirubin: 0.5 mg/dL (ref 0.0–1.2)
Total Protein: 6.1 g/dL — ABNORMAL LOW (ref 6.5–8.1)

## 2024-08-09 LAB — RAD ONC ARIA SESSION SUMMARY
Course Elapsed Days: 43
Plan Fractions Treated to Date: 31
Plan Prescribed Dose Per Fraction: 2 Gy
Plan Total Fractions Prescribed: 35
Plan Total Prescribed Dose: 70 Gy
Reference Point Dosage Given to Date: 62 Gy
Reference Point Session Dosage Given: 2 Gy
Session Number: 31

## 2024-08-09 LAB — TYPE AND SCREEN
ABO/RH(D): O POS
Antibody Screen: NEGATIVE
Unit division: 0

## 2024-08-09 LAB — GLUCOSE, CAPILLARY
Glucose-Capillary: 113 mg/dL — ABNORMAL HIGH (ref 70–99)
Glucose-Capillary: 113 mg/dL — ABNORMAL HIGH (ref 70–99)
Glucose-Capillary: 116 mg/dL — ABNORMAL HIGH (ref 70–99)
Glucose-Capillary: 118 mg/dL — ABNORMAL HIGH (ref 70–99)
Glucose-Capillary: 129 mg/dL — ABNORMAL HIGH (ref 70–99)
Glucose-Capillary: 130 mg/dL — ABNORMAL HIGH (ref 70–99)

## 2024-08-09 LAB — MAGNESIUM
Magnesium: 2 mg/dL (ref 1.7–2.4)
Magnesium: 1.7 mg/dL (ref 1.7–2.4)

## 2024-08-09 LAB — PHOSPHORUS
Phosphorus: 2.4 mg/dL — ABNORMAL LOW (ref 2.5–4.6)
Phosphorus: 1.8 mg/dL — ABNORMAL LOW (ref 2.5–4.6)

## 2024-08-09 MED ORDER — FLUTICASONE PROPIONATE 50 MCG/ACT NA SUSP
2.0000 | Freq: Every day | NASAL | Status: DC
  Administered 2024-08-09 – 2024-08-12 (×4): 2 via NASAL
  Filled 2024-08-09: qty 16

## 2024-08-09 MED ORDER — HYDROMORPHONE HCL 1 MG/ML IJ SOLN
1.0000 mg | INTRAMUSCULAR | Status: AC | PRN
  Administered 2024-08-09 (×2): 1 mg via INTRAVENOUS
  Filled 2024-08-09 (×3): qty 1

## 2024-08-09 MED ORDER — PNEUMOCOCCAL 20-VAL CONJ VACC 0.5 ML IM SUSY
0.5000 mL | PREFILLED_SYRINGE | INTRAMUSCULAR | Status: DC
  Filled 2024-08-09: qty 0.5

## 2024-08-09 MED ORDER — HYDRALAZINE HCL 20 MG/ML IJ SOLN: 5.0000 mg | Freq: Four times a day (QID) | INTRAMUSCULAR | Status: DC | PRN

## 2024-08-09 MED ORDER — VANCOMYCIN HCL 1500 MG/300ML IV SOLN
1500.0000 mg | INTRAVENOUS | Status: DC
  Administered 2024-08-09 – 2024-08-10 (×2): 1500 mg via INTRAVENOUS
  Filled 2024-08-09 (×3): qty 300

## 2024-08-09 MED ORDER — PROCHLORPERAZINE MALEATE 10 MG PO TABS: 10.0000 mg | ORAL_TABLET | Freq: Four times a day (QID) | ORAL | Status: DC | PRN

## 2024-08-09 MED ORDER — OXYCODONE HCL 5 MG PO TABS
5.0000 mg | ORAL_TABLET | ORAL | Status: AC | PRN
Start: 2024-08-09 — End: 2024-08-10
  Administered 2024-08-09 – 2024-08-10 (×3): 5 mg
  Filled 2024-08-09 (×3): qty 1

## 2024-08-09 MED ORDER — LACTATED RINGERS IV SOLN: INTRAVENOUS | Status: AC

## 2024-08-09 MED ORDER — LORATADINE 10 MG PO TABS
10.0000 mg | ORAL_TABLET | Freq: Every day | ORAL | Status: DC
  Administered 2024-08-09 – 2024-08-12 (×4): 10 mg
  Filled 2024-08-09 (×4): qty 1

## 2024-08-09 MED ORDER — VITAMIN D 25 MCG (1000 UNIT) PO TABS
1000.0000 [IU] | ORAL_TABLET | Freq: Every day | ORAL | Status: DC
  Administered 2024-08-09 – 2024-08-12 (×4): 1000 [IU]
  Filled 2024-08-09 (×4): qty 1

## 2024-08-09 MED ORDER — ALPRAZOLAM 0.5 MG PO TABS
1.0000 mg | ORAL_TABLET | Freq: Every day | ORAL | Status: DC | PRN
  Administered 2024-08-10 – 2024-08-11 (×2): 1 mg
  Filled 2024-08-09 (×2): qty 2

## 2024-08-09 MED ORDER — ALLOPURINOL 300 MG PO TABS
300.0000 mg | ORAL_TABLET | Freq: Every day | ORAL | Status: DC
  Administered 2024-08-09 – 2024-08-12 (×4): 300 mg
  Filled 2024-08-09 (×3): qty 1
  Filled 2024-08-09: qty 3

## 2024-08-09 MED ORDER — MORPHINE SULFATE 15 MG PO TABS
15.0000 mg | ORAL_TABLET | Freq: Four times a day (QID) | ORAL | Status: DC
  Administered 2024-08-09 – 2024-08-12 (×13): 15 mg
  Filled 2024-08-09 (×13): qty 1

## 2024-08-09 MED ORDER — K PHOS MONO-SOD PHOS DI & MONO 155-852-130 MG PO TABS
250.0000 mg | ORAL_TABLET | Freq: Two times a day (BID) | ORAL | Status: AC
  Administered 2024-08-09 – 2024-08-11 (×6): 250 mg
  Filled 2024-08-09 (×6): qty 1

## 2024-08-09 NOTE — Progress Notes (Signed)
 Speech Language Pathology Treatment: Dysphagia  Patient Details Name: Gilbert Thompson MRN: [id number] DOB: 10/22/1950 Today's Date: 08/09/2024 Time: 9249-9195 SLP Time Calculation (min) (ACUTE ONLY): 14 min  Assessment / Plan / Recommendation Clinical Impression  Pt seen to assess readiness for PO intake and/or instrumental swallow evaluation.  Today he reports improved comfort with pain medications.   Reviewed importance of oral care, including brushing with soft bristle toothbrush.  Pt noted to have slight white coating on soft palate and observed what appeared to be his mass on left posterior oral cavity.  He continues with dysphonia but demonstrates a strong cough ability.  Observed him consuming ice chips, thin water  and nectar thick liquids.  He demonstrates throat clearing across all po trials - With second bolus of magic mouthwash, pt admitted to sensation of retention and residuals prompting cough.  At this time, recommend pt undergo MBS to prevent limiting po diet unnecessarily and to determine potential helpful dysphagia mitigation strategies.  Left pt with ice chips, nectar thick juice and water , encouraging small amounts. If reflexively throat clearing, instructed him to clear his throat and re-swallow.  Encouraged use of IMST 10x's an hour but not immediately after intake due to pharyngeal retention sensation.     MBS planned for approximately noon today - RN, pt and wife all agreeable to plan.     HPI HPI: pt is a 73 yo male adm to [facility] with hypotension, poor intake.  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 He is undergoing concurrent chemoradiation. Last chemotherapy with cisplatin cycle 5 was on 07/26/2024. Further chemotherapy was withheld because of neutropenia and has been  receiving Zarxio support in our clinic. Pt received a PEG tube for nutrition and reports having consumed po and using feeding tube for nutrition. 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  He admits odynophagia and dysphagia are equal parts responsible for his poor intake. Pt CXR 08/07/2024: Patchy airspace opacities, especially in the lower lobes, but also in the right upper lobe and right middle lobe potentially from atypical or multilobar pneumonia.      SLP Plan  MBS          Recommendations  Diet recommendations: Thin liquid;Other(comment) (sips and chips, small sips of nectar thick juice left in room) Liquids provided via: Cup;Teaspoon Medication Administration: Via alternative means Supervision: Patient able to self feed Compensations: Slow rate;Small sips/bites;Multiple dry swallows after each bite/sip Postural Changes and/or Swallow Maneuvers: Seated upright 90 degrees;Upright 30-60 min after meal                Other(comment) Oral care prior to ice chip/H20           MBS   [name], MS [facility] SLP Acute Rehab Services Office [phone]   [name]  08/09/2024, 8:24 AM

## 2024-08-09 NOTE — Progress Notes (Signed)
 [name]   DOB:[birth year]   FM#:[id number]      ASSESSMENT & PLAN:  Gilbert Thompson. Gilbert Thompson is a 73 year old male patient admitted on 08/07/2024 with complaints of neutropenic fever, pneumonia, weakness and hypotension.  Oncologic history significant for oropharyngeal carcinoma.  Medical oncology following.   Neutropenic fever Generalized weakness Cancer related pain - Secondary to recent chemotherapy - Denies current pain, continue pain medication as ordered.   - Monitor fever curve, last temp 99.4 today - Continue supportive care   Chemotherapy-induced pancytopenia Leukopenia/neutropenia - WBC improving 1.5 with ANC 1.1.   - Patient has previously received Zarzio x 3 days.  Now on G-CSF/Nivestim x 4 days, day 2 today.    Anemia - Hemoglobin improved 8.9  - Recommend PRBC transfusion for hemoglobin <7.0 Thrombocytopenia - Platelets improving 58K - Recommend transfusion for platelets <20 K or <50 K with bleeding -Continue to monitor CBC with differential closely   Primary squamous cell carcinoma of oropharynx,  cT2,cN1,cM0,p16+, Stage 1  - Diagnosed 04/2024. Invasive squamous cell carcinoma.  - Last chemotherapy with cisplatin cycle 5 was on 07/26/2024. Further chemotherapy was withheld because of neutropenia and has been receiving Zarxio support in our clinic  -- We will hold further chemotherapy.  - Currently undergoing radiation therapy. Radiation therapy to be completed as planned.  - Medical oncology/Dr. Pasam managing   Lactic acidosis -- Elevated lactic acid levels, likely secondary to infection.  -- Continue close monitoring to assess response to treatment.   History of prostate adenocarcinoma - Diagnosed 04/29/2023 - Status post radical prostatectomy    Code Status Full   Subjective:  Patient seen awake and alert laying in bed in ICU. Appears more alert today. Tube feeding ongoing. Family member at bedside. States that he is feeling better every day.  Denies acute  pain at this time.  Noted still with some oral thrush.   Objective:   Intake/Output Summary (Last 24 hours) at 08/09/2024 1114 Last data filed at 08/09/2024 0031 Gross per 24 hour  Intake 1618.79 ml  Output --  Net 1618.79 ml     PHYSICAL EXAMINATION: ECOG PERFORMANCE STATUS: 3 - Symptomatic, >50% confined to bed  Vitals:   08/09/24 0600 08/09/24 0750  BP: 136/65   Pulse: 84   Resp: 16   Temp:  99.4 F (37.4 C)  SpO2: 97%    Filed Weights   08/07/24 2035 08/09/24 0500  Weight: 181 lb 10.5 oz (82.4 kg) 163 lb 5.8 oz (74.1 kg)    GENERAL: alert, no distress and comfortable SKIN: +discoloration skin color around neck/throat s/p RT  EYES: normal, conjunctiva are pink and non-injected, sclera clear OROPHARYNX: +thrush   NECK: supple, thyroid  normal size, non-tender, without nodularity LYMPH: no palpable lymphadenopathy in the cervical, axillary or inguinal LUNGS: clear to auscultation and percussion with normal breathing effort HEART: regular rate & rhythm and no murmurs and no lower extremity edema ABDOMEN: abdomen soft, non-tender and normal bowel sounds MUSCULOSKELETAL: no cyanosis of digits and no clubbing  PSYCH: alert & oriented x 3 with fluent speech NEURO: no focal motor/sensory deficits   All questions were answered. The patient knows to call the clinic with any problems, questions or concerns.   The total time spent in the appointment was 40 minutes encounter with patient including review of chart and various tests results, discussions about plan of care and coordination of care plan  Olam JINNY Brunner, NP 08/09/2024 11:14 AM    Labs Reviewed:  Lab  Results  Component Value Date   WBC 1.5 (L) 08/09/2024   HGB 8.9 (L) 08/09/2024   HCT 25.3 (L) 08/09/2024   MCV 84.9 08/09/2024   PLT 58 (L) 08/09/2024   Recent Labs    08/07/24 1035 08/07/24 1506 08/08/24 0405 08/09/24 0355  NA 124* 127* 131* 135  K 4.2 3.8 4.1 3.5  CL 90* 98 98 100  CO2 25 14* 24 21*   GLUCOSE 147* 81 190* 111*  BUN 15 9 15 22   CREATININE 1.21 0.48* 0.93 1.09  CALCIUM  8.6* 6.7* 8.4* 9.2  GFRNONAA >60 >60 >60 >60  PROT 6.2*  --  5.5* 6.1*  ALBUMIN 2.9*  --  2.4* 2.6*  AST 72*  --  65* 127*  ALT 77*  --  68* 103*  ALKPHOS 64  --  51 57  BILITOT 0.6  --  0.5 0.5    Studies Reviewed:  CT Angio Chest PE W/Cm &/Or Wo Cm Result Date: 08/07/2024 EXAM: CTA CHEST 08/07/2024 01:30:55 PM TECHNIQUE: CTA of the chest was performed after the administration of intravenous contrast. Multiplanar reformatted images are provided for review. MIP images are provided for review. Automated exposure control, iterative reconstruction, and/or weight based adjustment of the mA/kV was utilized to reduce the radiation dose to as low as reasonably achievable. COMPARISON: None available. 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 * Tracking Code: BO * right port a cath tip colon SVC. FINDINGS: PULMONARY ARTERIES: Pulmonary arteries are adequately opacified for evaluation. No acute pulmonary embolus. Main pulmonary artery is normal in caliber. MEDIASTINUM: Mild cardiomegaly. Thoracic aortic, coronary artery, and branch vessel atheromatous vascular calcifications. There is no acute abnormality of the thoracic aorta. LYMPH NODES: Right high lung node 1.3 cm in short axis on image 62 series 5, formerly 0.7 cm. No mediastinal or axillary lymphadenopathy. LUNGS AND PLEURA: Bilateral airway thickening noted. Patchy and nodular regions of airspace opacity in the right upper lobe, right middle lobe, and confluent in both lower lobes especially dependently raising the possibility of multilobar pneumonia. There is also some faint ground glass opacity posteriorly in the lingula. No evidence of pleural effusion or pneumothorax. UPPER ABDOMEN: Limited images of the upper abdomen are unremarkable. Peg tube  noted. SOFT TISSUES AND BONES: Thoracic spondylosis. No acute bone or soft tissue abnormality. IMPRESSION: 1. No pulmonary embolism. 2. Patchy airspace opacities, especially in the lower lobes, but also in the right upper lobe and right middle lobe potentially from atypical or multilobar pneumonia. 3. Enlarged right high lung lymph node measuring 1.3 cm short axis, increased from 0.7 cm. This is likely reactive given the findings in the lung, although surveillance is suggested. 4. Mild cardiomegaly. 5. Thoracic aortic and coronary artery atherosclerosis. 6. Bilateral airway thickening. 7. Thoracic spondylosis. Electronically signed by: Ryan Salvage MD 08/07/2024 02:26 PM EST RP Workstation: [id number]   DG Chest Port 1 View Result Date: 08/07/2024 CLINICAL DATA:  Questionable sepsis. Head and neck cancer, hypotension with decreased O2 sats. EXAM: PORTABLE CHEST 1 VIEW COMPARISON:  09/28/2022.  CT chest 04/06/2024. FINDINGS: Right IJ Port-A-Cath tip is at the SVC RA junction. Lungs are clear. No pleural fluid. IMPRESSION: No acute findings. Electronically Signed   By: Newell Eke M.D.   On: 08/07/2024 10:59

## 2024-08-09 NOTE — Plan of Care (Signed)
  Problem: Clinical Measurements: Goal: Ability to maintain clinical measurements within normal limits will improve Outcome: Progressing   Problem: Health Behavior/Discharge Planning: Goal: Ability to manage health-related needs will improve Outcome: Progressing   Problem: Clinical Measurements: Goal: Will remain free from infection Outcome: Progressing   

## 2024-08-09 NOTE — Progress Notes (Signed)
 PROGRESS NOTE    Gilbert Thompson  FMW:[id number] DOB: [birth year] DOA: 08/07/2024 PCP: Windy Coy, MD    Chief Complaint  Patient presents with   Weakness   Hypotension    Brief Narrative:  73 y.o. male with medical history significant of asthma, prostate cancer, CAD, GERD, hyperlipidemia, hypertension and primary squamous cell carcinoma of the oropharynx currently on radiation and chemotherapy (last chemotherapy 12 days prior to presentation) presented with weakness, fever and hypotension to the cancer center with blood pressure of 78/48.  In the ED, he was febrile, tachycardic.  COVID/influenza/RSV PCR negative.  WBC of 0.9, hemoglobin 8.4 and platelets of 41.  Sodium of 124 with normal renal function.  Lactic acid of 2.3.  Portable chest x-ray showed no acute findings.  CTA chest showed no pulmonary embolism but showed patchy airspace opacities suggestive of multilobar pneumonia.  He was started on IV fluids and antibiotics.  Oncology was consulted.    Assessment & Plan:   Principal Problem:   Sepsis due to undetermined organism [facility]) Active Problems:   Coronary artery disease involving native coronary artery of native heart without angina pectoris   Primary squamous cell carcinoma of oropharynx (HCC)   Leukopenia due to antineoplastic chemotherapy   Neutropenia with fever   Pancytopenia (HCC)   Hyponatremia   Moderate protein malnutrition   Transaminitis   Oral thrush   Multifocal pneumonia   Sinus tachycardia   Hypomagnesemia   Hypophosphatemia   Neutropenia   Protein-calorie malnutrition, severe   #1 severe sepsis secondary to multifocal pneumonia/neutropenic fever, POA -Patient noted to have met criteria for sepsis with fever, tachycardia, hypotension, pancytopenia and lactic acidosis felt secondary to multilobar pneumonia. - Preliminary sputum with few Staphylococcus aureus pending. - Blood cultures with no growth to date x 2 days. - MRSA PCR negative. - SARS  coronavirus 2 by PCR negative. - Influenza A by PCR negative - Recurrence of BV by PCR negative. - RSV by PCR negative. -Urine strep pneumococcus antigen negative. - Fever curve trending down. - Neutropenia slowly improving. - Patient improving clinically. -SLP following, patient noted to be increased risk for aspiration, patient for probable modified barium swallow during this hospitalization. - Continue empiric IV cefepime, IV azithromycin. - Continue Breztri, Flonase, Xopenex nebs, Claritin, PPI. - Supportive care.  2.  Primary squamous cell carcinoma of the oropharynx, pancytopenia -Patient undergoing radiation and chemotherapy last chemotherapy was 07/26/2024. - Continue filgrastim as per oncology. - Status post transfusion 1 unit PRBCs (08/08/2024). - Counts slowly improving. - Oncology recommending continuation of radiation treatments daily and no further chemotherapy is planned for today. - Transfusion threshold platelet count <20K. - Hemoglobin < 7k - Per hematology/oncology.  3.  Transaminitis -With a worsening transaminitis over the past 24 hours. - Likely be secondary to Diflucan. - Discontinue Diflucan. - Follow LFTs. - If LFTs continue to worsen we will get an abdominal ultrasound and check an acute hepatitis panel. - Supportive care.  4.  Hyponatremia -Improved with hydration, free water  restriction.  5.  Lactic acidosis -Resolved.  6.  Acute metabolic acidosis -Improved.  7.  Hypophosphatemia/hypomagnesemia -Magnesium  at 2.0. - Phosphorus at 2.4. - K-Phos 250 mg per tube twice daily x 3 days. - Repeat labs in the AM.  8.  CAD/hyperlipidemia -Stable.  - Continue to hold aspirin  secondary to thrombocytopenia. - Hold ezetimibe  due to worsening LFTs.  9.  Hypocalcemia - improved.  10.  Oral thrush -Continue nystatin swish and swallow. - Discontinue Diflucan due to  worsening transaminitis.  11.  Dysphagia -Patient being followed by SLP. - Patient  for probable MBS during this hospitalization. - Continue current n.p.o. with ice chips. - Continue tube feeds per PEG tube.  12.  Severe protein calorie malnutrition -Being followed by RD. - Continue tube feeds of Osmolite 1.5 at 65 cc/h. - Continue Prosource TF 2060 mL daily. -Continue thiamine as recommended by RD.    DVT prophylaxis: SCDs Code Status: Full Family Communication: Updated patient and daughter at bedside. Disposition: TBD  Status is: Inpatient Remains inpatient appropriate because: Severity of illness   Consultants:  Hematology/oncology: Dr. Autumn 08/08/2024  Procedures:  CT angiogram chest 08/07/2024 Chest x-ray 08/07/2024   Antimicrobials:  Anti-infectives (From admission, onward)    Start     Dose/Rate Route Frequency Ordered Stop   08/09/24 1000  fluconazole (DIFLUCAN) tablet 100 mg  Status:  Discontinued        100 mg Oral Daily 08/08/24 1231 08/09/24 0801   08/08/24 1330  fluconazole (DIFLUCAN) tablet 200 mg        200 mg Oral  Once 08/08/24 1231 08/08/24 1452   08/08/24 1330  fluconazole (DIFLUCAN) IVPB 200 mg        200 mg 100 mL/hr over 60 Minutes Intravenous  Once 08/08/24 1231 08/08/24 1548   08/07/24 2200  ceFEPIme (MAXIPIME) 2 g in sodium chloride  0.9 % 100 mL IVPB        2 g 200 mL/hr over 30 Minutes Intravenous Every 12 hours 08/07/24 1517     08/07/24 1600  azithromycin (ZITHROMAX) 500 mg in sodium chloride  0.9 % 250 mL IVPB        500 mg 250 mL/hr over 60 Minutes Intravenous Every 24 hours 08/07/24 1517 08/12/24 1559   08/07/24 1030  ceFEPIme (MAXIPIME) 2 g in sodium chloride  0.9 % 100 mL IVPB        2 g 200 mL/hr over 30 Minutes Intravenous  Once 08/07/24 1021 08/07/24 1107   08/07/24 1030  metroNIDAZOLE (FLAGYL) IVPB 500 mg        500 mg 100 mL/hr over 60 Minutes Intravenous  Once 08/07/24 1021 08/07/24 1144   08/07/24 1030  vancomycin (VANCOCIN) IVPB 1000 mg/200 mL premix        1,000 mg 200 mL/hr over 60 Minutes Intravenous   Once 08/07/24 1021 08/07/24 1251         Subjective: Patient sitting up in bed.  Overall states he is feeling better than he did on admission.  Feels shortness of breath has improved.  Fatigue improving.  No chest pain.  No abdominal pain.  Tolerating current diet.  Daughter at bedside.  Objective: Vitals:   08/09/24 0412 08/09/24 0500 08/09/24 0600 08/09/24 0750  BP: 123/64 131/61 136/65   Pulse:  88 84   Resp: 14 15 16    Temp: (!) 97.2 F (36.2 C)   99.4 F (37.4 C)  TempSrc: Oral   Axillary  SpO2:  96% 97%   Weight:  74.1 kg    Height:        Intake/Output Summary (Last 24 hours) at 08/09/2024 1108 Last data filed at 08/09/2024 0031 Gross per 24 hour  Intake 1618.79 ml  Output --  Net 1618.79 ml   Filed Weights   08/07/24 2035 08/09/24 0500  Weight: 82.4 kg 74.1 kg    Examination:  General exam: Appears calm and comfortable.  Improving oral thrush Respiratory system: Coarse breath sounds on the right.  No wheezing, no  crackles, fair air movement.  Speaking in full sentences.  Cardiovascular system: S1 & S2 heard, RRR. No JVD, murmurs, rubs, gallops or clicks. No pedal edema. Gastrointestinal system: Abdomen is nondistended, soft and nontender. No organomegaly or masses felt. Normal bowel sounds heard.  PEG tube in place. Central nervous system: Alert and oriented. No focal neurological deficits. Extremities: Symmetric 5 x 5 power. Skin: No rashes, lesions or ulcers Psychiatry: Judgement and insight appear normal. Mood & affect appropriate.     Data Reviewed: I have personally reviewed following labs and imaging studies  CBC: Recent Labs  Lab 08/07/24 1035 08/07/24 2142 08/08/24 0405 08/08/24 0857 08/09/24 0355  WBC 0.9*  --  0.8*  --  1.5*  NEUTROABS 0.4*  --  0.5*  --  1.1*  HGB 8.4* 6.9* 7.7* 8.5* 8.9*  HCT 23.6* 19.2* 22.0* 23.2* 25.3*  MCV 89.4  --  86.6  --  84.9  PLT 41*  --  39*  --  58*    Basic Metabolic Panel: Recent Labs  Lab  08/07/24 1035 08/07/24 1506 08/08/24 0405 08/08/24 1744 08/09/24 0355  NA 124* 127* 131*  --  135  K 4.2 3.8 4.1  --  3.5  CL 90* 98 98  --  100  CO2 25 14* 24  --  21*  GLUCOSE 147* 81 190*  --  111*  BUN 15 9 15   --  22  CREATININE 1.21 0.48* 0.93  --  1.09  CALCIUM  8.6* 6.7* 8.4*  --  9.2  MG  --  0.7* 2.2 1.9 2.0  PHOS  --  <1.0* 4.2 3.0 2.4*    GFR: Estimated Creatinine Clearance: 59.5 mL/min (by C-G formula based on SCr of 1.09 mg/dL).  Liver Function Tests: Recent Labs  Lab 08/07/24 1035 08/08/24 0405 08/09/24 0355  AST 72* 65* 127*  ALT 77* 68* 103*  ALKPHOS 64 51 57  BILITOT 0.6 0.5 0.5  PROT 6.2* 5.5* 6.1*  ALBUMIN 2.9* 2.4* 2.6*    CBG: Recent Labs  Lab 08/08/24 1940 08/08/24 2330 08/09/24 0334 08/09/24 0802  GLUCAP 124* 123* 116* 129*     Recent Results (from the past 240 hours)  Blood Culture (routine x 2)     Status: None (Preliminary result)   Collection Time: 08/07/24 10:36 AM   Specimen: BLOOD  Result Value Ref Range Status   Specimen Description   Final    BLOOD RIGHT ANTECUBITAL Performed at [facility], [address] [place], [place] [address]    Special Requests   Final    BOTTLES DRAWN AEROBIC AND ANAEROBIC Blood Culture results may not be optimal due to an inadequate volume of blood received in culture bottles Performed at [facility], [address] [place], [place] [address]    Culture   Final    NO GROWTH 2 DAYS Performed at [facility], [address], [place], [place] [address]    Report Status PENDING  Incomplete  Blood Culture (routine x 2)     Status: None (Preliminary result)   Collection Time: 08/07/24 10:36 AM   Specimen: BLOOD  Result Value Ref Range Status   Specimen Description   Final    BLOOD BLOOD RIGHT FOREARM Performed at [facility], [address] [place], [place] [address]    Special Requests   Final    BOTTLES DRAWN AEROBIC ONLY  Blood Culture results may not be optimal due to an inadequate  volume of blood received in culture bottles Performed at [facility], [address] [place], [place] [address]    Culture   Final    NO GROWTH 2 DAYS Performed at [facility], [address] [place], [place] [address]    Report Status PENDING  Incomplete  Resp panel by RT-PCR (RSV, Flu A&B, Covid) Anterior Nasal Swab     Status: None   Collection Time: 08/07/24 11:40 AM   Specimen: Anterior Nasal Swab  Result Value Ref Range Status   SARS Coronavirus 2 by RT PCR NEGATIVE NEGATIVE Final    Comment: (NOTE) SARS-CoV-2 target nucleic acids are NOT DETECTED.  The SARS-CoV-2 RNA is generally detectable in upper respiratory specimens during the acute phase of infection. The lowest concentration of SARS-CoV-2 viral copies this assay can detect is 138 copies/mL. A negative result does not preclude SARS-Cov-2 infection and should not be used as the sole basis for treatment or other patient management decisions. A negative result may occur with  improper specimen collection/handling, submission of specimen other than nasopharyngeal swab, presence of viral mutation(s) within the areas targeted by this assay, and inadequate number of viral copies(<138 copies/mL). A negative result must be combined with clinical observations, patient history, and epidemiological information. The expected result is Negative.  Fact Sheet for Patients:  bloggercourse.com  Fact Sheet for Healthcare Providers:  seriousbroker.it  This test is no t yet approved or cleared by the [place]  FDA and  has been authorized for detection and/or diagnosis of SARS-CoV-2 by FDA under an Emergency Use Authorization (EUA). This EUA will remain  in effect (meaning this test can be used) for the duration of the COVID-19 declaration under Section 564(b)(1) of the Act, 21 U.S.C.section  360bbb-3(b)(1), unless the authorization is terminated  or revoked sooner.       Influenza A by PCR NEGATIVE NEGATIVE Final   Influenza B by PCR NEGATIVE NEGATIVE Final    Comment: (NOTE) The Xpert Xpress SARS-CoV-2/FLU/RSV plus assay is intended as an aid in the diagnosis of influenza from Nasopharyngeal swab specimens and should not be used as a sole basis for treatment. Nasal washings and aspirates are unacceptable for Xpert Xpress SARS-CoV-2/FLU/RSV testing.  Fact Sheet for Patients: bloggercourse.com  Fact Sheet for Healthcare Providers: seriousbroker.it  This test is not yet approved or cleared by the [place]  FDA and has been authorized for detection and/or diagnosis of SARS-CoV-2 by FDA under an Emergency Use Authorization (EUA). This EUA will remain in effect (meaning this test can be used) for the duration of the COVID-19 declaration under Section 564(b)(1) of the Act, 21 U.S.C. section 360bbb-3(b)(1), unless the authorization is terminated or revoked.     Resp Syncytial Virus by PCR NEGATIVE NEGATIVE Final    Comment: (NOTE) Fact Sheet for Patients: bloggercourse.com  Fact Sheet for Healthcare Providers: seriousbroker.it  This test is not yet approved or cleared by the [place]  FDA and has been authorized for detection and/or diagnosis of SARS-CoV-2 by FDA under an Emergency Use Authorization (EUA). This EUA will remain in effect (meaning this test can be used) for the duration of the COVID-19 declaration under Section 564(b)(1) of the Act, 21 U.S.C. section 360bbb-3(b)(1), unless the authorization is terminated or revoked.  Performed at [facility], [address] [place], [place] [address]   MRSA Next Gen by PCR, Nasal     Status: None   Collection Time: 08/07/24  8:31 PM   Specimen: Nasal Mucosa;  Nasal Swab  Result Value Ref  Range Status   MRSA by PCR Next Gen NOT DETECTED NOT DETECTED Final    Comment: (NOTE) The GeneXpert MRSA Assay (FDA approved for NASAL specimens only), is one component of a comprehensive MRSA colonization surveillance program. It is not intended to diagnose MRSA infection nor to guide or monitor treatment for MRSA infections. Test performance is not FDA approved in patients less than 92 years old. Performed at [facility], [address], [place], [place] [address]   Expectorated Sputum Assessment w Gram Stain, Rflx to Resp Cult     Status: None   Collection Time: 08/08/24  2:25 AM   Specimen: Expectorated Sputum  Result Value Ref Range Status   Specimen Description EXPECTORATED SPUTUM  Final   Special Requests NONE  Final   Sputum evaluation   Final    Sputum specimen not acceptable for testing.  Please recollect.   NOTIFIED MATHEW HERO RN OF RECOLLECT @ [phone] ON 08/08/2024 BY MTA Performed at [facility], [address] [place], [place] [address]    Report Status 08/08/2024 FINAL  Final  Expectorated Sputum Assessment w Gram Stain, Rflx to Resp Cult     Status: None   Collection Time: 08/08/24  9:15 AM  Result Value Ref Range Status   Specimen Description EXPECTORATED SPUTUM  Final   Special Requests NONE  Final   Sputum evaluation   Final    THIS SPECIMEN IS ACCEPTABLE FOR SPUTUM CULTURE Performed at [facility], [address] [place], [place] [address]    Report Status 08/08/2024 FINAL  Final  Culture, Respiratory w Gram Stain     Status: None (Preliminary result)   Collection Time: 08/08/24  9:15 AM  Result Value Ref Range Status   Specimen Description   Final    EXPECTORATED SPUTUM Performed at [facility], [address], [place], [place] [address]    Special Requests   Final    NONE Reflexed from [id number] Performed at [facility], [address], [place], [place]  [address]    Gram Stain   Final    ABUNDANT WBC PRESENT, PREDOMINANTLY PMN MODERATE GRAM POSITIVE RODS    Culture   Final    FEW STAPHYLOCOCCUS AUREUS CULTURE REINCUBATED FOR BETTER GROWTH Performed at [facility], [address], [place], [place] [address]    Report Status PENDING  Incomplete         Radiology Studies: CT Angio Chest PE W/Cm &/Or Wo Cm Result Date: 08/07/2024 EXAM: CTA CHEST 08/07/2024 01:30:55 PM TECHNIQUE: CTA of the chest was performed after the administration of intravenous contrast. Multiplanar reformatted images are provided for review. MIP images are provided for review. Automated exposure control, iterative reconstruction, and/or weight based adjustment of the mA/kV was utilized to reduce the radiation dose to as low as reasonably achievable. COMPARISON: None available. 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 * Tracking Code: BO * right port a cath tip colon SVC. FINDINGS: PULMONARY ARTERIES: Pulmonary arteries are adequately opacified for evaluation. No acute pulmonary embolus. Main pulmonary artery is normal in caliber. MEDIASTINUM: Mild cardiomegaly. Thoracic aortic, coronary artery, and branch vessel atheromatous vascular calcifications. There is no acute abnormality of the thoracic aorta. LYMPH NODES: Right high lung node 1.3 cm in short axis on image 62 series 5, formerly 0.7 cm. No mediastinal  or axillary lymphadenopathy. LUNGS AND PLEURA: Bilateral airway thickening noted. Patchy and nodular regions of airspace opacity in the right upper lobe, right middle lobe, and confluent in both lower lobes especially dependently raising the possibility of multilobar pneumonia. There is also some faint ground glass opacity posteriorly in the lingula. No evidence of pleural effusion or pneumothorax. UPPER ABDOMEN: Limited images of the upper abdomen are  unremarkable. Peg tube noted. SOFT TISSUES AND BONES: Thoracic spondylosis. No acute bone or soft tissue abnormality. IMPRESSION: 1. No pulmonary embolism. 2. Patchy airspace opacities, especially in the lower lobes, but also in the right upper lobe and right middle lobe potentially from atypical or multilobar pneumonia. 3. Enlarged right high lung lymph node measuring 1.3 cm short axis, increased from 0.7 cm. This is likely reactive given the findings in the lung, although surveillance is suggested. 4. Mild cardiomegaly. 5. Thoracic aortic and coronary artery atherosclerosis. 6. Bilateral airway thickening. 7. Thoracic spondylosis. Electronically signed by: Ryan Salvage MD 08/07/2024 02:26 PM EST RP Workstation: [id number]        Scheduled Meds:  allopurinol   300 mg Per Tube Daily   budesonide -glycopyrrolate -formoterol   2 puff Inhalation BID   Chlorhexidine  Gluconate Cloth  6 each Topical Daily   cholecalciferol   1,000 Units Per Tube Daily   feeding supplement (PROSource TF20)  60 mL Per Tube Daily   filgrastim  (NIVESTYM ) SQ  300 mcg Subcutaneous Q1200   finasteride   5 mg Oral Daily   fluticasone   2 spray Each Nare Daily   levalbuterol   0.63 mg Nebulization TID   loratadine   10 mg Per Tube Daily   morphine   15 mg Per Tube Q6H   nystatin   5 mL Oral QID   mouth rinse  15 mL Mouth Rinse 4 times per day   pantoprazole  (PROTONIX ) IV  40 mg Intravenous Q12H   phosphorus  250 mg Per Tube BID   [START ON 08/10/2024] pneumococcal 20-valent conjugate vaccine  0.5 mL Intramuscular Tomorrow-1000   thiamine   100 mg Per Tube Daily   Continuous Infusions:  azithromycin  Stopped (08/08/24 1651)   ceFEPime  (MAXIPIME ) IV Stopped (08/09/24 1115)   feeding supplement (OSMOLITE 1.5 CAL) 65 mL/hr at 08/09/24 0300   lactated ringers  75 mL/hr at 08/09/24 1044     LOS: 2 days    Time spent: 40 minutes    Toribio Hummer, MD [facility]   To contact the attending provider between 7A-7P  or the covering provider during after hours 7P-7A, please log into the web site [email or web address] and access using universal [place] password for that web site. If you do not have the password, please call the hospital operator.  08/09/2024, 11:08 AM

## 2024-08-09 NOTE — Evaluation (Signed)
 Physical Therapy Evaluation Patient Details Name: Gilbert Thompson MRN: [id number] DOB: 05/30/1951 Today's Date: 08/09/2024  History of Present Illness  73 y.o. presents [facility] with weakness, fever and hypotension febrile, tachycardic  Sodium of 124 with normal renal function.  Lactic acid of 2.3.  TA chest showed no pulmonary embolism but showed patchy airspace opacities suggestive of multilobar pneumonia. 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  Pt currently with functional limitations due to the deficits listed below (see PT Problem List). Pt will benefit from acute skilled PT to increase their independence and safety with mobility to allow discharge.     The patient is eager to ambulate. Patient is noted with  tremors. Gait is slow, requires UE support for safe ambulation, noted feet crossed/ tangled when turning around. Steady support from PT. Recommend HHPT at DC.  Patient resides with  wife, has been independent, driving, and playing golf recently.       If plan is discharge home, recommend the following: A little help with walking and/or transfers;A little help with bathing/dressing/bathroom;Help with stairs or ramp for entrance;Assist for transportation   Can travel by private vehicle        Equipment Recommendations  (tbd)  Recommendations for Other Services       Functional Status Assessment Patient has had a recent decline in their functional status and demonstrates the ability to make significant improvements in function in a reasonable and predictable amount of time.     Precautions / Restrictions Precautions Precautions: Fall Precaution/Restrictions Comments: PEG and port      Mobility  Bed Mobility Overal bed mobility: Needs Assistance Bed Mobility: Sidelying to Sit,  Supine to Sit   Sidelying to sit: Contact guard assist, Used rails Supine to sit: Contact guard, Used rails     General bed mobility comments: extra time to push upright    Transfers Overall transfer level: Needs assistance Equipment used: Rolling walker (2 wheels) Transfers: Sit to/from Stand Sit to Stand: Contact guard assist           General transfer comment: extra effort to power up from low bed    Ambulation/Gait Ambulation/Gait assistance: Min assist Gait Distance (Feet): 65 Feet Assistive device: Rolling walker (2 wheels) Gait Pattern/deviations: Shuffle, Trunk flexed Gait velocity: decr Gait velocity interpretation: <1.31 ft/sec, indicative of household ambulator   General Gait Details: Slow speed, noted tremors arms and legs  Stairs            Wheelchair Mobility     Tilt Bed    Modified Rankin (Stroke Patients Only)       Balance Overall balance assessment: Needs assistance, History of Falls Sitting-balance support: Feet supported, No upper extremity supported Sitting balance-Leahy Scale: Fair     Standing balance support: During functional activity, No upper extremity supported Standing balance-Leahy Scale: Poor                               Pertinent Vitals/Pain Pain Assessment Pain Assessment: No/denies pain    Home Living Family/patient expects to be discharged to:: Private residence Living Arrangements: Spouse/significant other Available Help at Discharge: Family Type of Home: House Home Access: Stairs to enter Entrance Stairs-Rails: Right Entrance Stairs-Number of Steps: 3   Home Layout: One level Home Equipment: Agricultural Consultant (  2 wheels) Additional Comments: may have RW> pt not sure    Prior Function Prior Level of Function : Independent/Modified Independent             Mobility Comments: played golf 3 weeks ago, has been independnet until this admission, drives ADLs Comments: independent      Extremity/Trunk Assessment   Upper Extremity Assessment Upper Extremity Assessment: Generalized weakness (noted tremors)    Lower Extremity Assessment Lower Extremity Assessment: Generalized weakness    Cervical / Trunk Assessment Cervical / Trunk Assessment: Normal  Communication   Communication Communication: Impaired Factors Affecting Communication: Reduced clarity of speech    Cognition Arousal: Alert Behavior During Therapy: WFL for tasks assessed/performed   PT - Cognitive impairments: Difficult to assess                       PT - Cognition Comments: appears  slow to respond, comprehend, Oriented x 4 Following commands: Impaired Following commands impaired: Follows one step commands with increased time     Cueing Cueing Techniques: Verbal cues     General Comments      Exercises     Assessment/Plan    PT Assessment Patient needs continued PT services  PT Problem List Decreased strength;Decreased activity tolerance;Decreased mobility;Decreased safety awareness;Decreased knowledge of precautions       PT Treatment Interventions DME instruction;Gait training;Functional mobility training;Therapeutic activities;Therapeutic exercise;Patient/family education    PT Goals (Current goals can be found in the Care Plan section)  Acute Rehab PT Goals Patient Stated Goal: to walk PT Goal Formulation: With patient/family Time For Goal Achievement: 08/23/24 Potential to Achieve Goals: Good    Frequency Min 3X/week     Co-evaluation               AM-PAC PT 6 Clicks Mobility  Outcome Measure Help needed turning from your back to your side while in a flat bed without using bedrails?: A Little Help needed moving from lying on your back to sitting on the side of a flat bed without using bedrails?: A Little Help needed moving to and from a bed to a chair (including a wheelchair)?: A Little Help needed standing up from a chair using your arms  (e.g., wheelchair or bedside chair)?: A Little Help needed to walk in hospital room?: A Little Help needed climbing 3-5 steps with a railing? : A Lot 6 Click Score: 17    End of Session Equipment Utilized During Treatment: Gait belt Activity Tolerance: Patient limited by fatigue Patient left: in chair;with call bell/phone within reach;with chair alarm set;with family/visitor present Nurse Communication: Mobility status PT Visit Diagnosis: Unsteadiness on feet (R26.81);Muscle weakness (generalized) (M62.81)    Time: 9175-9150 PT Time Calculation (min) (ACUTE ONLY): 25 min   Charges:   PT Evaluation $PT Eval Low Complexity: 1 Low PT Treatments $Gait Training: 8-22 mins PT General Charges $$ ACUTE PT VISIT: 1 Visit         Darice Potters PT Acute Rehabilitation Services Office [phone]   Potters Darice Norris 08/09/2024, 12:21 PM

## 2024-08-09 NOTE — Procedures (Signed)
 Modified Barium Swallow Study  Patient Details  Name: Gilbert Thompson MRN: [id number] Date of Birth: 08-Jul-1951  Today's Date: 08/09/2024  Modified Barium Swallow completed.  Full report located under Chart Review in the Imaging Section.  History of Present Illness     Clinical Impression Patient presents with mild oropharyngeal dysphagia likely due to his oropharyngeal mass, iatrogenic effects of XRT and his bilateral vocal fold atrophy.  These combined issues with edema result in decreased airway closure allowing trace aspiration of thin liquid with nonproductive cough response.  Aspiration noted most especially occuring early in the study - as barium spilled into open airway prior to swallow being triggered.  SLP questions some component of warm up effect. Further episodes of laryngeal penetration were inconsistent and only trace.  Mild retention noted in pharynx (more at tongue base with solids) post-swallow due to decreased tongue base retraction and inadequate mastication. Pt c/o severe xerostomia causing foods to adhere to his oral structures  No aspiration or penetration with cracker, puree or honey thick liquids.   Pharyngeal clearance with puree was excellent, likely due to combined weight of bolus allowing improved neuro input.  Various postures were not helpful to mitigate retention or aspiration - including chin tuck *tucked approx 20* due to neck stiffness* and head turn left.  Liquid swallows are more effective than dry swallows which are not really functional.  At this time, recommend pt be able to start po diet with strict precautions.  If pt is reflexively coughing during intake, it is likely due to aspiration.  Given he has PEG tube for nutrition, recommend close monitoring of po diet via temperatures, lung sound monitoring, etc. Expect swallow to improve several week after XRT is completed. Factors that may increase risk of adverse event in presence of aspiration Noe & Lianne  2021): Reduced saliva;Poor general health and/or compromised immunity  Swallow Evaluation Recommendations Recommendations: PO diet PO Diet Recommendation: Dysphagia 1 (Pureed);(EXTRA GRAVY/SAUCES - CREAMY PUREES!) Thin liquids (Level 0 )= CONSIDER WATER  WITH MEALS Liquid Administration via: Cup;Straw Medication Administration: Crushed with puree (or via PEG) Swallowing strategies  : Slow rate;Small bites/sips;Clear throat intermittently (consider water  with meals) Postural changes: Position pt fully upright for meals;Stay upright 30-60 min after meals Oral care recommendations: Oral care BID (2x/day) Caregiver Recommendations: Have oral suction available     [name], MS [facility] SLP Acute Rehab Services Office [phone]  [name] 08/09/2024,2:50 PM

## 2024-08-09 NOTE — Progress Notes (Signed)
 Speech Language Pathology Treatment: Dysphagia  Patient Details Name: Gilbert Thompson MRN: [id number] DOB: 02-19-1951 Today's Date: 08/09/2024 Time: 8484-8469 SLP Time Calculation (min) (ACUTE ONLY): 15 min  Assessment / Plan / Recommendation Clinical Impression  Session focused on review of MBS as well as clinical reasoning for swallow precautions/mitigation strategies and establishment of swallowing exercises to maximize swallow rehab.   In addition, SLP measure pt's mandibular ROM which was approx 22 mm bilaterally which is below normal. Pt reports improvement with ROM during hospital coarse. Suspect his edema from XRT, mass and pain contribute to decreased ROM. [name] reports that Gilbert Thompson has always had a small mouth. Using teach back and flouroscopy study loops, differentiated between cough and hock expectoration for airway protection with explaining importance of both.   Encouraged pt to continue use of the IS - and consider flutter valve for secretion clearance. Pt demonstrated IS use without verbal cues and excellent effort/tolerance.   Advised pt and family that given his dysphagia with sensory deficits, we will need to monitor for fever trends, lung sound variance, WBC, imaging results, etc for assurance of po tolerance - using teach back.   Pt demonstrated head ROM - and we discussed importance of neck mobility/muscular motility for swallowing. Posted swallow precaution signs in room and provided pt/family with a copy. SlP will attempt to fashion a trismus apparatus for pt to use after XRT completed. Placed diet order with MD permission.     HPI HPI: pt is a 73 yo male adm to [facility] with hypotension, poor intake.  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 He is undergoing concurrent  chemoradiation. Last chemotherapy with cisplatin cycle 5 was on 07/26/2024. Further chemotherapy was withheld because of neutropenia and has been receiving Zarxio support in our clinic. Pt received a PEG tube for nutrition and reports having consumed po and using feeding tube for nutrition. 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  He admits odynophagia and dysphagia are equal parts responsible for his poor intake. Pt CXR 08/07/2024: Patchy airspace opacities, especially in the lower lobes, but also in the right upper lobe and right middle lobe potentially from atypical or multilobar pneumonia.      SLP Plan  Continue with current plan of care          Recommendations  Diet recommendations: Dysphagia 1 (puree);Thin liquid Liquids provided via: Cup;Straw Medication Administration: Via alternative means Supervision: Staff to assist with self feeding Compensations: Slow rate;Small sips/bites;Other (Comment);Follow solids with liquid Postural Changes and/or Swallow Maneuvers: Seated upright 90 degrees;Upright 30-60 min after meal                  Oral care BID   Frequent or constant Supervision/Assistance Dysphagia, oropharyngeal phase (R13.12)     Continue with current plan of care    Gilbert POUR, MS [facility] SLP Acute Rehab Services Office [phone]  Gilbert Thompson  08/09/2024, 3:46 PM

## 2024-08-09 NOTE — Telephone Encounter (Signed)
 Patient Product/process Development Scientist completed.    The patient is insured through [employer]. Patient has Medicare and is not eligible for a copay card, but may be able to apply for patient assistance or Medicare RX Payment Plan (Patient Must reach out to their plan, if eligible for payment plan), if available.    Ran test claim for Breztri and the current 30 day co-pay is $0.00.   This test claim was processed through [facility]- copay amounts may vary at other pharmacies due to pharmacy/plan contracts, or as the patient moves through the different stages of their insurance plan.     Reyes Sharps, CPHT Pharmacy Technician Patient Advocate Specialist Lead [facility] Patient Advocate Team Direct Number: [phone]  Fax: [phone]

## 2024-08-09 NOTE — Progress Notes (Signed)
 Pharmacy Antibiotic Note  Gilbert Thompson is a 73 y.o. 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  He was started on cefepime and azithromycin on admission for PNA and febrile neutropenia. Pharmacy has been consulted on 08/09/24 to start vancomycin for staph aureus noted in the sputum culture collected on 08/08/24.  - Patient received vancomycin 1000 mg IV x1 in the ED on 11/17 at noon  Today, 08/09/2024: - WBC low, ANC up 1.1 - Tmax 99.4 - scr 1.09  Plan: - vancomycin 1500 mg IV q24h for est AUC 524  ___________________________________  Height: 5' 9 (175.3 cm) (pt reported) Weight: 74.1 kg (163 lb 5.8 oz) IBW/kg (Calculated) : 70.7  Temp (24hrs), Avg:97.8 F (36.6 C), Min:97.2 F (36.2 C), Max:99.4 F (37.4 C)  Recent Labs  Lab 08/07/24 1035 08/07/24 1053 08/07/24 1400 08/07/24 1416 08/07/24 1506 08/07/24 1509 08/07/24 2142 08/08/24 0405 08/09/24 0355  WBC 0.9*  --   --   --   --   --   --  0.8* 1.5*  CREATININE 1.21  --   --   --  0.48*  --   --  0.93 1.09  LATICACIDVEN  --  2.3* 4.0* 7.2*  --  6.4* 1.3  --   --     Estimated Creatinine Clearance: 59.5 mL/min (by C-G formula based on SCr of 1.09 mg/dL).    No Known Allergies   Thank you for allowing pharmacy to be a part of this patient's care.  Gilbert Thompson 08/09/2024 2:58 PM

## 2024-08-10 ENCOUNTER — Other Ambulatory Visit: Payer: Self-pay

## 2024-08-10 ENCOUNTER — Ambulatory Visit

## 2024-08-10 DIAGNOSIS — D709 Neutropenia, unspecified: Secondary | ICD-10-CM | POA: Diagnosis not present

## 2024-08-10 DIAGNOSIS — A419 Sepsis, unspecified organism: Secondary | ICD-10-CM | POA: Diagnosis not present

## 2024-08-10 DIAGNOSIS — C109 Malignant neoplasm of oropharynx, unspecified: Secondary | ICD-10-CM | POA: Diagnosis not present

## 2024-08-10 DIAGNOSIS — E871 Hypo-osmolality and hyponatremia: Secondary | ICD-10-CM | POA: Diagnosis not present

## 2024-08-10 LAB — COMPREHENSIVE METABOLIC PANEL WITH GFR
ALT: 116 U/L — ABNORMAL HIGH (ref 0–44)
AST: 114 U/L — ABNORMAL HIGH (ref 15–41)
Albumin: 2.5 g/dL — ABNORMAL LOW (ref 3.5–5.0)
Alkaline Phosphatase: 69 U/L (ref 38–126)
Anion gap: 9 (ref 5–15)
BUN: 21 mg/dL (ref 8–23)
CO2: 24 mmol/L (ref 22–32)
Calcium: 8.6 mg/dL — ABNORMAL LOW (ref 8.9–10.3)
Chloride: 102 mmol/L (ref 98–111)
Creatinine, Ser: 0.93 mg/dL (ref 0.61–1.24)
GFR, Estimated: >60 mL/min (ref >60)
Glucose, Bld: 132 mg/dL — ABNORMAL HIGH (ref 70–99)
Potassium: 3.4 mmol/L — ABNORMAL LOW (ref 3.5–5.1)
Sodium: 135 mmol/L (ref 135–145)
Total Bilirubin: 0.4 mg/dL (ref 0.0–1.2)
Total Protein: 5.9 g/dL — ABNORMAL LOW (ref 6.5–8.1)

## 2024-08-10 LAB — CBC WITH DIFFERENTIAL/PLATELET
Abs Immature Granulocytes: 0.15 K/uL — ABNORMAL HIGH (ref 0.00–0.07)
Basophils Absolute: 0 K/uL (ref 0.0–0.1)
Basophils Relative: 1 %
Eosinophils Absolute: 0 K/uL (ref 0.0–0.5)
Eosinophils Relative: 1 %
HCT: 24.1 % — ABNORMAL LOW (ref 39.0–52.0)
Hemoglobin: 8.3 g/dL — ABNORMAL LOW (ref 13.0–17.0)
Immature Granulocytes: 9 %
Lymphocytes Relative: 13 %
Lymphs Abs: 0.2 K/uL — ABNORMAL LOW (ref 0.7–4.0)
MCH: 30.3 pg (ref 26.0–34.0)
MCHC: 34.4 g/dL (ref 30.0–36.0)
MCV: 88 fL (ref 80.0–100.0)
Monocytes Absolute: 0.1 K/uL (ref 0.1–1.0)
Monocytes Relative: 7 %
Neutro Abs: 1.1 K/uL — ABNORMAL LOW (ref 1.7–7.7)
Neutrophils Relative %: 69 %
Platelets: 62 K/uL — ABNORMAL LOW (ref 150–400)
RBC: 2.74 MIL/uL — ABNORMAL LOW (ref 4.22–5.81)
RDW: 16.1 % — ABNORMAL HIGH (ref 11.5–15.5)
Smear Review: NORMAL
WBC: 1.6 K/uL — ABNORMAL LOW (ref 4.0–10.5)
nRBC: 0 % (ref 0.0–0.2)

## 2024-08-10 LAB — RAD ONC ARIA SESSION SUMMARY
Course Elapsed Days: 44
Plan Fractions Treated to Date: 32
Plan Prescribed Dose Per Fraction: 2 Gy
Plan Total Fractions Prescribed: 35
Plan Total Prescribed Dose: 70 Gy
Reference Point Dosage Given to Date: 64 Gy
Reference Point Session Dosage Given: 2 Gy
Session Number: 32

## 2024-08-10 LAB — GLUCOSE, CAPILLARY
Glucose-Capillary: 160 mg/dL — ABNORMAL HIGH (ref 70–99)
Glucose-Capillary: 113 mg/dL — ABNORMAL HIGH (ref 70–99)
Glucose-Capillary: 93 mg/dL (ref 70–99)
Glucose-Capillary: 137 mg/dL — ABNORMAL HIGH (ref 70–99)
Glucose-Capillary: 96 mg/dL (ref 70–99)

## 2024-08-10 LAB — MAGNESIUM: Magnesium: 1.7 mg/dL (ref 1.7–2.4)

## 2024-08-10 LAB — PHOSPHORUS: Phosphorus: 2.5 mg/dL (ref 2.5–4.6)

## 2024-08-10 MED ORDER — METRONIDAZOLE 500 MG PO TABS
500.0000 mg | ORAL_TABLET | Freq: Two times a day (BID) | ORAL | Status: DC
  Administered 2024-08-10 – 2024-08-11 (×2): 500 mg
  Filled 2024-08-10 (×2): qty 1

## 2024-08-10 MED ORDER — OXYCODONE HCL 5 MG PO TABS
5.0000 mg | ORAL_TABLET | ORAL | Status: DC | PRN
Start: 2024-08-10 — End: 2024-08-12
  Administered 2024-08-10 – 2024-08-12 (×6): 5 mg
  Filled 2024-08-10 (×7): qty 1

## 2024-08-10 MED ORDER — MAGNESIUM SULFATE 2 GM/50ML IV SOLN
2.0000 g | Freq: Once | INTRAVENOUS | Status: AC
  Administered 2024-08-10: 2 g via INTRAVENOUS
  Filled 2024-08-10: qty 50

## 2024-08-10 MED ORDER — POTASSIUM CHLORIDE 20 MEQ PO PACK
40.0000 meq | PACK | Freq: Once | ORAL | Status: AC
  Administered 2024-08-10: 40 meq
  Filled 2024-08-10: qty 2

## 2024-08-10 NOTE — Progress Notes (Signed)
 PROGRESS NOTE    Gilbert Thompson  FMW:[id number] DOB: [birth year] DOA: 08/07/2024 PCP: Windy Coy, MD    Chief Complaint  Patient presents with   Weakness   Hypotension    Brief Narrative:  73 y.o. male with medical history significant of asthma, prostate cancer, CAD, GERD, hyperlipidemia, hypertension and primary squamous cell carcinoma of the oropharynx currently on radiation and chemotherapy (last chemotherapy 12 days prior to presentation) presented with weakness, fever and hypotension to the cancer center with blood pressure of 78/48.  In the ED, he was febrile, tachycardic.  COVID/influenza/RSV PCR negative.  WBC of 0.9, hemoglobin 8.4 and platelets of 41.  Sodium of 124 with normal renal function.  Lactic acid of 2.3.  Portable chest x-ray showed no acute findings.  CTA chest showed no pulmonary embolism but showed patchy airspace opacities suggestive of multilobar pneumonia.  He was started on IV fluids and antibiotics.  Oncology was consulted.    Assessment & Plan:   Principal Problem:   Sepsis due to undetermined organism [facility]) Active Problems:   Coronary artery disease involving native coronary artery of native heart without angina pectoris   Primary squamous cell carcinoma of oropharynx (HCC)   Leukopenia due to antineoplastic chemotherapy   Neutropenia with fever   Pancytopenia (HCC)   Hyponatremia   Moderate protein malnutrition   Transaminitis   Oral thrush   Multifocal pneumonia   Sinus tachycardia   Hypomagnesemia   Hypophosphatemia   Neutropenia   Protein-calorie malnutrition, severe   #1 severe sepsis secondary to multifocal pneumonia/neutropenic fever, POA -Patient noted to have met criteria for sepsis with fever, tachycardia, hypotension, pancytopenia and lactic acidosis felt secondary to multilobar pneumonia. - Preliminary sputum with few Staphylococcus aureus pending. - Blood cultures with no growth to date x 3 days. - MRSA PCR negative. - SARS  coronavirus 2 by PCR negative. - Influenza A by PCR negative - Influenza B by PCR negative. - RSV by PCR negative. -Urine strep pneumococcus antigen negative. - Fever curve trending down. - Neutropenia slowly improving. - Patient improving clinically. -Sputum preliminary with few Staph aureus. -SLP following, patient noted to be increased risk for aspiration, patient for probable modified barium swallow during this hospitalization. - Continue empiric IV azithromycin . -Continue IV vancomycin  which was started 08/09/2024. -Discontinue IV cefepime . -Place of Flagyl . - Continue Breztri , Flonase , Xopenex  nebs, Claritin , PPI. - Supportive care.  2.  Primary squamous cell carcinoma of the oropharynx, pancytopenia -Patient undergoing radiation and chemotherapy last chemotherapy was 07/26/2024. - Continue filgrastim  as per oncology. - Status post transfusion 1 unit PRBCs (08/08/2024). - Counts slowly improving. - Oncology recommending continuation of radiation treatments daily and no further chemotherapy is planned for today. - Transfusion threshold platelet count <20K. - Hemoglobin < 7k - Per hematology/oncology.  3.  Transaminitis -With a worsening transaminitis initially on 08/09/2024.  - Likely be secondary to Diflucan . - Discontinued Diflucan . -Check a right upper quadrant ultrasound. -Repeat labs in the AM. - Supportive care.  4.  Hyponatremia -Improved with hydration, free water  restriction.  5.  Lactic acidosis -Resolved.  6.  Acute metabolic acidosis - Resolved.   7.  Hypophosphatemia/hypomagnesemia/hypokalemia - Potassium at 3.4, magnesium  at 1.7. - Phosphorus at 2.5. - Continue K-Phos 250 mg per tube twice daily x 3 days. - Potassium packet 40 mEq per tube x 1.   -Repeat labs in the AM.  8.  CAD/hyperlipidemia -Stable.  - Continue to hold aspirin  secondary to thrombocytopenia. - Continue to hold ezetimibe  due  to worsening LFTs.  9.  Hypocalcemia -  improved.  10.  Oral thrush -Continue nystatin swish and swallow. - Diflucan discontinued due to transaminitis.    11.  Dysphagia -Patient being followed by SLP. - Patient underwent MBS on 08/09/2024 and speech recommending dysphagia 1 diet.  -Continue tube feeds via PEG tube.  12.  Severe protein calorie malnutrition -Being followed by RD. - Continue tube feeds of Osmolite 1.5 at 65 cc/h. - Continue Prosource TF 2060 mL daily. -Continue thiamine as recommended by RD.    DVT prophylaxis: SCDs Code Status: Full Family Communication: Updated patient and sister at bedside.  Updated wife early on at bedside.  Disposition: TBD  Status is: Inpatient Remains inpatient appropriate because: Severity of illness   Consultants:  Hematology/oncology: Dr. Autumn 08/08/2024  Procedures:  CT angiogram chest 08/07/2024 Chest x-ray 08/07/2024   Antimicrobials:  Anti-infectives (From admission, onward)    Start     Dose/Rate Route Frequency Ordered Stop   08/10/24 2200  metroNIDAZOLE (FLAGYL) tablet 500 mg        500 mg Per Tube Every 12 hours 08/10/24 1512     08/09/24 1600  vancomycin (VANCOREADY) IVPB 1500 mg/300 mL        1,500 mg 150 mL/hr over 120 Minutes Intravenous Every 24 hours 08/09/24 1510     08/09/24 1000  fluconazole (DIFLUCAN) tablet 100 mg  Status:  Discontinued        100 mg Oral Daily 08/08/24 1231 08/09/24 0801   08/08/24 1330  fluconazole (DIFLUCAN) tablet 200 mg        200 mg Oral  Once 08/08/24 1231 08/08/24 1452   08/08/24 1330  fluconazole (DIFLUCAN) IVPB 200 mg        200 mg 100 mL/hr over 60 Minutes Intravenous  Once 08/08/24 1231 08/08/24 1548   08/07/24 2200  ceFEPIme (MAXIPIME) 2 g in sodium chloride  0.9 % 100 mL IVPB  Status:  Discontinued        2 g 200 mL/hr over 30 Minutes Intravenous Every 12 hours 08/07/24 1517 08/10/24 1508   08/07/24 1600  azithromycin (ZITHROMAX) 500 mg in sodium chloride  0.9 % 250 mL IVPB        500 mg 250 mL/hr over 60  Minutes Intravenous Every 24 hours 08/07/24 1517 08/12/24 1559   08/07/24 1030  ceFEPIme (MAXIPIME) 2 g in sodium chloride  0.9 % 100 mL IVPB        2 g 200 mL/hr over 30 Minutes Intravenous  Once 08/07/24 1021 08/07/24 1107   08/07/24 1030  metroNIDAZOLE (FLAGYL) IVPB 500 mg        500 mg 100 mL/hr over 60 Minutes Intravenous  Once 08/07/24 1021 08/07/24 1144   08/07/24 1030  vancomycin (VANCOCIN) IVPB 1000 mg/200 mL premix        1,000 mg 200 mL/hr over 60 Minutes Intravenous  Once 08/07/24 1021 08/07/24 1251         Subjective: Patient noted to have returned from radiation.  Feels he may be getting a low-grade temp.  Denies any chest pain.  No significant shortness of breath.  No abdominal pain.  Tolerating dysphagia 1 diet.  Sister at bedside.    Objective: Vitals:   08/10/24 1203 08/10/24 1245 08/10/24 1357 08/10/24 1615  BP: 137/65   133/69  Pulse: 96   88  Resp: 20   18  Temp: 98.6 F (37 C) 99.9 F (37.7 C)  98.5 F (36.9 C)  TempSrc: Oral Oral  Oral  SpO2: 95%  94% 100%  Weight:      Height:        Intake/Output Summary (Last 24 hours) at 08/10/2024 1625 Last data filed at 08/09/2024 2113 Gross per 24 hour  Intake 1043.2 ml  Output --  Net 1043.2 ml   Filed Weights   08/07/24 2035 08/09/24 0500 08/10/24 0853  Weight: 82.4 kg 74.1 kg 74.1 kg    Examination:  General exam: NAD.  Oral thrush improving.  Respiratory system: Improved coarse breath sounds on the right.  No wheezing.  No crackles.  Fair air movement.  Speaking in full sentences.  Cardiovascular system: Regular rate rhythm no murmurs rubs or gallops.  No JVD.  No lower extremity edema.  Gastrointestinal system: Abdomen is soft, nontender, nondistended, positive bowel sounds.  PEG tube in place.  Central nervous system: Alert and oriented. No focal neurological deficits. Extremities: Symmetric 5 x 5 power. Skin: No rashes, lesions or ulcers Psychiatry: Judgement and insight appear normal. Mood &  affect appropriate.     Data Reviewed: I have personally reviewed following labs and imaging studies  CBC: Recent Labs  Lab 08/07/24 1035 08/07/24 2142 08/08/24 0405 08/08/24 0857 08/09/24 0355 08/10/24 0553  WBC 0.9*  --  0.8*  --  1.5* 1.6*  NEUTROABS 0.4*  --  0.5*  --  1.1* 1.1*  HGB 8.4* 6.9* 7.7* 8.5* 8.9* 8.3*  HCT 23.6* 19.2* 22.0* 23.2* 25.3* 24.1*  MCV 89.4  --  86.6  --  84.9 88.0  PLT 41*  --  39*  --  58* 62*    Basic Metabolic Panel: Recent Labs  Lab 08/07/24 1035 08/07/24 1506 08/07/24 1506 08/08/24 0405 08/08/24 1744 08/09/24 0355 08/09/24 1731 08/10/24 0553  NA 124* 127*  --  131*  --  135  --  135  K 4.2 3.8  --  4.1  --  3.5  --  3.4*  CL 90* 98  --  98  --  100  --  102  CO2 25 14*  --  24  --  21*  --  24  GLUCOSE 147* 81  --  190*  --  111*  --  132*  BUN 15 9  --  15  --  22  --  21  CREATININE 1.21 0.48*  --  0.93  --  1.09  --  0.93  CALCIUM  8.6* 6.7*  --  8.4*  --  9.2  --  8.6*  MG  --  0.7*   < > 2.2 1.9 2.0 1.7 1.7  PHOS  --  <1.0*   < > 4.2 3.0 2.4* 1.8* 2.5   < > = values in this interval not displayed.    GFR: Estimated Creatinine Clearance: 69.7 mL/min (by C-G formula based on SCr of 0.93 mg/dL).  Liver Function Tests: Recent Labs  Lab 08/07/24 1035 08/08/24 0405 08/09/24 0355 08/10/24 0553  AST 72* 65* 127* 114*  ALT 77* 68* 103* 116*  ALKPHOS 64 51 57 69  BILITOT 0.6 0.5 0.5 0.4  PROT 6.2* 5.5* 6.1* 5.9*  ALBUMIN 2.9* 2.4* 2.6* 2.5*    CBG: Recent Labs  Lab 08/09/24 2337 08/10/24 0400 08/10/24 0724 08/10/24 1118 08/10/24 1612  GLUCAP 130* 160* 113* 93 137*     Recent Results (from the past 240 hours)  Blood Culture (routine x 2)     Status: None (Preliminary result)   Collection Time: 08/07/24 10:36 AM   Specimen: BLOOD  Result Value Ref Range Status   Specimen Description   Final    BLOOD RIGHT ANTECUBITAL Performed at [facility], [address], [place], [place] [address]     Special Requests   Final    BOTTLES DRAWN AEROBIC AND ANAEROBIC Blood Culture results may not be optimal due to an inadequate volume of blood received in culture bottles Performed at [facility], [address] [place], [place] [address]    Culture   Final    NO GROWTH 3 DAYS Performed at [facility], [address] [place], [place] [address]    Report Status PENDING  Incomplete  Blood Culture (routine x 2)     Status: None (Preliminary result)   Collection Time: 08/07/24 10:36 AM   Specimen: BLOOD  Result Value Ref Range Status   Specimen Description   Final    BLOOD BLOOD RIGHT FOREARM Performed at [facility], [address] [place], [place] [address]    Special Requests   Final    BOTTLES DRAWN AEROBIC ONLY Blood Culture results may not be optimal due to an inadequate volume of blood received in culture bottles Performed at [facility], [address] [place], [place] [address]    Culture   Final    NO GROWTH 3 DAYS Performed at [facility], [address], [place], [place] [address]    Report Status PENDING  Incomplete  Resp panel by RT-PCR (RSV, Flu A&B, Covid) Anterior Nasal Swab     Status: None   Collection Time: 08/07/24 11:40 AM   Specimen: Anterior Nasal Swab  Result Value Ref Range Status   SARS Coronavirus 2 by RT PCR NEGATIVE NEGATIVE Final    Comment: (NOTE) SARS-CoV-2 target nucleic acids are NOT DETECTED.  The SARS-CoV-2 RNA is generally detectable in upper respiratory specimens during the acute phase of infection. The lowest concentration of SARS-CoV-2 viral copies this assay can detect is 138 copies/mL. A negative result does not preclude SARS-Cov-2 infection and should not be used as the sole basis for treatment or other patient management decisions. A negative result may occur with  improper specimen collection/handling, submission of specimen other than nasopharyngeal swab,  presence of viral mutation(s) within the areas targeted by this assay, and inadequate number of viral copies(<138 copies/mL). A negative result must be combined with clinical observations, patient history, and epidemiological information. The expected result is Negative.  Fact Sheet for Patients:  bloggercourse.com  Fact Sheet for Healthcare Providers:  seriousbroker.it  This test is no t yet approved or cleared by the [place]  FDA and  has been authorized for detection and/or diagnosis of SARS-CoV-2 by FDA under an Emergency Use Authorization (EUA). This EUA will remain  in effect (meaning this test can be used) for the duration of the COVID-19 declaration under Section 564(b)(1) of the Act, 21 U.S.C.section 360bbb-3(b)(1), unless the authorization is terminated  or revoked sooner.       Influenza A by PCR NEGATIVE NEGATIVE Final   Influenza B by PCR NEGATIVE NEGATIVE Final    Comment: (NOTE) The Xpert Xpress SARS-CoV-2/FLU/RSV plus assay is intended as an aid in the diagnosis of influenza from Nasopharyngeal swab specimens and should not be used as a sole basis for treatment. Nasal washings and aspirates are unacceptable for Xpert Xpress SARS-CoV-2/FLU/RSV testing.  Fact Sheet for Patients: bloggercourse.com  Fact Sheet for Healthcare Providers: seriousbroker.it  This test is not yet approved or  cleared by the [place]  FDA and has been authorized for detection and/or diagnosis of SARS-CoV-2 by FDA under an Emergency Use Authorization (EUA). This EUA will remain in effect (meaning this test can be used) for the duration of the COVID-19 declaration under Section 564(b)(1) of the Act, 21 U.S.C. section 360bbb-3(b)(1), unless the authorization is terminated or revoked.     Resp Syncytial Virus by PCR NEGATIVE NEGATIVE Final    Comment: (NOTE) Fact Sheet for  Patients: bloggercourse.com  Fact Sheet for Healthcare Providers: seriousbroker.it  This test is not yet approved or cleared by the [place]  FDA and has been authorized for detection and/or diagnosis of SARS-CoV-2 by FDA under an Emergency Use Authorization (EUA). This EUA will remain in effect (meaning this test can be used) for the duration of the COVID-19 declaration under Section 564(b)(1) of the Act, 21 U.S.C. section 360bbb-3(b)(1), unless the authorization is terminated or revoked.  Performed at [facility], [address] [place], [place] [address]   MRSA Next Gen by PCR, Nasal     Status: None   Collection Time: 08/07/24  8:31 PM   Specimen: Nasal Mucosa; Nasal Swab  Result Value Ref Range Status   MRSA by PCR Next Gen NOT DETECTED NOT DETECTED Final    Comment: (NOTE) The GeneXpert MRSA Assay (FDA approved for NASAL specimens only), is one component of a comprehensive MRSA colonization surveillance program. It is not intended to diagnose MRSA infection nor to guide or monitor treatment for MRSA infections. Test performance is not FDA approved in patients less than 73 years old. Performed at [facility], [address], [place], [place] [address]   Expectorated Sputum Assessment w Gram Stain, Rflx to Resp Cult     Status: None   Collection Time: 08/08/24  2:25 AM   Specimen: Expectorated Sputum  Result Value Ref Range Status   Specimen Description EXPECTORATED SPUTUM  Final   Special Requests NONE  Final   Sputum evaluation   Final    Sputum specimen not acceptable for testing.  Please recollect.   NOTIFIED MATHEW HERO RN OF RECOLLECT @ [phone] ON 08/08/2024 BY MTA Performed at [facility], [address], [place], [place] [address]    Report Status 08/08/2024 FINAL  Final  Expectorated Sputum Assessment w Gram Stain, Rflx to Resp Cult     Status: None    Collection Time: 08/08/24  9:15 AM  Result Value Ref Range Status   Specimen Description EXPECTORATED SPUTUM  Final   Special Requests NONE  Final   Sputum evaluation   Final    THIS SPECIMEN IS ACCEPTABLE FOR SPUTUM CULTURE Performed at [facility], [address] [place], [place] [address]    Report Status 08/08/2024 FINAL  Final  Culture, Respiratory w Gram Stain     Status: None (Preliminary result)   Collection Time: 08/08/24  9:15 AM  Result Value Ref Range Status   Specimen Description   Final    EXPECTORATED SPUTUM Performed at [facility], [address] [place], [place] [address]    Special Requests   Final    NONE Reflexed from [phone] Performed at [facility], [address], [place], [place] [address]    Gram Stain   Final    ABUNDANT WBC PRESENT, PREDOMINANTLY PMN MODERATE GRAM POSITIVE RODS    Culture   Final    FEW STAPHYLOCOCCUS AUREUS SUSCEPTIBILITIES TO FOLLOW WITHIN MIXED FLORA  Performed at [facility], [address], [place], [place] [address]    Report Status PENDING  Incomplete         Radiology Studies: DG Swallowing Func-Speech Pathology Result Date: 08/09/2024 Table formatting from the original result was not included. Modified Barium Swallow Study Patient Details Name: Gilbert Thompson MRN: [id number] Date of Birth: [birth year] Today's Date: 08/09/2024 HPI/PMH: HPI: pt is a 73 yo male adm to [facility] with hypotension, poor intake.  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 He is undergoing concurrent chemoradiation. Last chemotherapy with cisplatin  cycle 5 was on 07/26/2024. Further chemotherapy was withheld because of neutropenia and has been receiving Zarxio  support in our clinic. Pt  received a PEG tube for nutrition and reports having consumed po and using feeding tube for nutrition. 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  He admits odynophagia and dysphagia are equal parts responsible for his poor intake. Pt CXR 08/07/2024: Patchy airspace opacities, especially in the lower lobes, but also in the right upper lobe and right middle lobe potentially from atypical or multilobar pneumonia. Clinical Impression: Clinical Impression: Patient presents with mild oropharyngeal dysphagia likely due to his oropharyngeal mass, iatrogenic effects of XRT and his bilateral vocal fold atrophy.  These combined issues with edema result in decreased airway closure allowing trace aspiration of thin liquid with nonproductive cough response.  Aspiration noted most especially occuring early in the study - as barium spilled into open airway prior to swallow being triggered.  SLP questions some component of warm up effect. Further episodes of laryngeal penetration were inconsistent and only trace.  Mild retention noted in pharynx (more at tongue base with solids) post-swallow due to decreased tongue base retraction and inadequate mastication. Pt c/o severe xerostomia causing foods to adhere to his oral structures  No aspiration or penetration with cracker, puree or honey thick liquids.   Pharyngeal clearance with puree was excellent, likely due to combined weight of bolus allowing improved neuro input.  Various postures were not helpful to mitigate retention or aspiration - including chin tuck *tucked approx 20* due to neck stiffness* and head turn left.  Liquid swallows are more effective than dry swallows which are not really functional.  At this time, recommend pt be able to start po diet with strict precautions.  If pt is reflexively coughing during intake, it is likely due to aspiration.  Given he has PEG tube for nutrition, recommend close  monitoring of po diet via temperatures, lung sound monitoring, etc. Expect swallow to improve several week after XRT is completed. Factors that may increase risk of adverse event in presence of aspiration Noe & Lianne 2021): Factors that may increase risk of adverse event in presence of aspiration Noe & Lianne 2021): Reduced saliva; Poor general health and/or compromised immunity Recommendations/Plan: Swallowing Evaluation Recommendations Swallowing Evaluation Recommendations Recommendations: Dysphagia 1 (Pureed);(EXTRA GRAVY/SAUCES - CREAMY PUREES!) Thin liquids (Level 0 )=  CONSIDER WATER  WITH MEALS Liquid Administration via: Cup; Straw Medication Administration: Crushed with puree (or via PEG) Swallowing strategies : Start intake with WATER ; Small bites/sips. Follow solid w/liquid. Stop intake if overtly coughing. Clear throat intermittently Treatment Plan Treatment Plan Treatment recommendations: Therapy as outlined in treatment plan below Follow-up recommendations: Follow physicians's  recommendations for discharge plan and follow up therapies Functional status assessment: Patient has had a recent decline in their functional status and demonstrates the ability to make significant improvements in function in a reasonable and predictable amount of time. Treatment frequency: Min 1x/week Treatment duration: 1 week Interventions: Aspiration precaution training; Patient/family education; Oropharyngeal exercises; Respiratory muscle strength training Recommendations Recommendations for follow up therapy are one component of a multi-disciplinary discharge planning process, led by the attending physician.  Recommendations may be updated based on patient status, additional functional criteria and insurance authorization. Assessment: Orofacial Exam: Orofacial Exam Oral Cavity: Oral Hygiene: Xerostomia; Lingual coating; Edema; Erythema Oral Cavity - Dentition: Adequate natural dentition Orofacial Anatomy: Other  (comment) (pt has known left tonsillar mass close to uvula and down to glossotonsillar sulcus) Anatomy: Anatomy: Suspected cervical osteophytes Boluses Administered: Boluses Administered Boluses Administered: Thin liquids (Level 0); Mildly thick liquids (Level 2, nectar thick); Puree; Moderately thick liquids (Level 3, honey thick); Solid  Oral Impairment Domain: Oral Impairment Domain Lip Closure: No labial escape Tongue control during bolus hold: Cohesive bolus between tongue to palatal seal Bolus preparation/mastication: Minimal chewing/mashing with majority of bolus unchewed Bolus transport/lingual motion: Slow tongue motion Oral residue: Trace residue lining oral structures Location of oral residue : Palate; Tongue Initiation of pharyngeal swallow : Posterior laryngeal surface of the epiglottis; Pyriform sinuses; Valleculae  Pharyngeal Impairment Domain: Pharyngeal Impairment Domain Soft palate elevation: No bolus between soft palate (SP)/pharyngeal wall (PW) Laryngeal elevation: Partial superior movement of thyroid  cartilage/partial approximation of arytenoids to epiglottic petiole Anterior hyoid excursion: Partial anterior movement Epiglottic movement: Partial inversion Laryngeal vestibule closure: Incomplete, narrow column air/contrast in laryngeal vestibule Pharyngeal stripping wave : Present - diminished Pharyngeal contraction (A/P view only): N/A Pharyngoesophageal segment opening: Partial distention/partial duration, partial obstruction of flow Tongue base retraction: Narrow column of contrast or air between tongue base and PPW Pharyngeal residue: Collection of residue within or on pharyngeal structures; Trace residue within or on pharyngeal structures Location of pharyngeal residue: Tongue base; Valleculae (tongue base and vallecular more than pharyngeal wall and pyriform sinus)  Esophageal Impairment Domain: Esophageal Impairment Domain Esophageal clearance upright position: Esophageal retention Pill:  Pill Consistency administered: -- (DNT) Penetration/Aspiration Scale Score: Penetration/Aspiration Scale Score 1.  Material does not enter airway: Moderately thick liquids (Level 3, honey thick); Puree; Solid 2.  Material enters airway, remains ABOVE vocal cords then ejected out: Mildly thick liquids (Level 2, nectar thick) 7.  Material enters airway, passes BELOW cords and not ejected out despite cough attempt by patient: Thin liquids (Level 0) Compensatory Strategies: Compensatory Strategies Compensatory strategies: Yes Chin tuck: Ineffective Ineffective Chin Tuck: Thin liquid (Level 0) Liquid wash: Effective Left head turn: Ineffective Ineffective Left Head Turn: Mildly thick liquid (Level 2, nectar thick)   General Information: Caregiver present: Yes  Diet Prior to this Study: NPO; Other (Comment) (ice chips)   Temperature : Normal   Respiratory Status: WFL   Supplemental O2: None (Room air)   History of Recent Intubation: No  Behavior/Cognition: Alert; Cooperative; Pleasant mood Self-Feeding Abilities: Able to self-feed Baseline vocal quality/speech: Dysphonic Volitional Cough: Able to elicit Volitional Swallow: Able to elicit Exam Limitations: No limitations Goal Planning: Prognosis for improved oropharyngeal function: Good No data recorded No data recorded Patient/Family Stated Goal: He admits odynophagia and dysphagia are equal parts responsible for his poor intake. No data recorded Pain: Pain Assessment Pain Assessment: No/denies pain Faces Pain Scale: 2 Pain Location: oropharynx Pain Descriptors / Indicators: Aching; Burning Pain Intervention(s): Limited  activity within patient's tolerance; Other (comment) (RN provided medication during session) End of Session: Start Time:SLP Start Time (ACUTE ONLY): 1210 Stop Time: SLP Stop Time (ACUTE ONLY): 1235 Time Calculation:SLP Time Calculation (min) (ACUTE ONLY): 25 min Charges: SLP Evaluations $ SLP Speech Visit: 1 Visit SLP Evaluations $BSS Swallow: 1 Procedure  $MBS Swallow: 1 Procedure $Swallowing Treatment: 1 Procedure SLP visit diagnosis: SLP Visit Diagnosis: Dysphagia, oropharyngeal phase (R13.12) Past Medical History: Past Medical History: Diagnosis Date  Asthma   Cancer (HCC)   prostate cancer  Coronary artery disease   pt states they were unable to place a stent at that time  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, follwed by [facility]  Dyspnea   with exertion  GERD (gastroesophageal reflux disease)   Hyperlipidemia   Hypertension   Pneumonia  Past Surgical History: Past Surgical History: Procedure Laterality Date  CATARACT EXTRACTION Bilateral   IR GASTROSTOMY TUBE MOD SED  06/23/2024  IR IMAGING GUIDED PORT INSERTION  06/23/2024  LEFT HEART CATH AND CORONARY ANGIOGRAPHY N/A 06/05/2022  Procedure: LEFT HEART CATH AND CORONARY ANGIOGRAPHY;  Surgeon: Claudene Victory ORN, MD;  Location: [facility];  Service: Cardiovascular;  Laterality: N/A;  LYMPHADENECTOMY Bilateral 04/29/2023  Procedure: BILATERAL PELVIC LYMPHADENECTOMY;  Surgeon: Renda Glance, MD;  Location: [facility] ORS;  Service: Urology;  Laterality: Bilateral;  PROSTATE BIOPSY    ROBOT ASSISTED LAPAROSCOPIC RADICAL PROSTATECTOMY N/A 04/29/2023  Procedure: XI ROBOTIC ASSISTED LAPAROSCOPIC RADICAL PROSTATECTOMY LEVEL 2;  Surgeon: Renda Glance, MD;  Location: [facility] ORS;  Service: Urology;  Laterality: N/A;  210 MINUTES NEEDED FOR CASE  UPPER GASTROINTESTINAL ENDOSCOPY    VENTRAL HERNIA REPAIR N/A 10/05/2023  Procedure: LAPAROSCOPIC VENTRAL HERNIA REPAAIR WITH MESH;  Surgeon: Rubin Calamity, MD;  Location: [facility] OR;  Service: General;  Laterality: N/A; [name], MS [facility] SLP Acute Rehab Services Office [phone] [name] 08/09/2024, 2:55 PM       Scheduled Meds:  allopurinol   300 mg Per Tube Daily   budesonide -glycopyrrolate -formoterol   2 puff Inhalation BID   Chlorhexidine  Gluconate Cloth  6 each Topical Daily   cholecalciferol   1,000 Units Per Tube Daily   feeding supplement (PROSource TF20)   60 mL Per Tube Daily   finasteride   5 mg Oral Daily   fluticasone   2 spray Each Nare Daily   levalbuterol   0.63 mg Nebulization TID   loratadine   10 mg Per Tube Daily   metroNIDAZOLE   500 mg Per Tube Q12H   morphine   15 mg Per Tube Q6H   nystatin   5 mL Oral QID   mouth rinse  15 mL Mouth Rinse 4 times per day   pantoprazole  (PROTONIX ) IV  40 mg Intravenous Q12H   phosphorus  250 mg Per Tube BID   pneumococcal 20-valent conjugate vaccine  0.5 mL Intramuscular Tomorrow-1000   thiamine   100 mg Per Tube Daily   Continuous Infusions:  azithromycin  500 mg (08/10/24 1549)   feeding supplement (OSMOLITE 1.5 CAL) 1,000 mL (08/10/24 0308)   vancomycin  Stopped (08/09/24 1827)     LOS: 3 days    Time spent: 40 minutes    Toribio Hummer, MD [facility]   To contact the attending provider between 7A-7P or the covering provider during after hours 7P-7A, please log into the web site [email or web address] and access using universal  password for that web site. If you do not have the password, please call the hospital operator.  08/10/2024, 4:25 PM

## 2024-08-10 NOTE — TOC Initial Note (Signed)
 Transition of Care [facility]) - Initial/Assessment Note    Patient Details  Name: Gilbert Thompson MRN: [id number] Date of Birth: 07-23-1951  [facility]) CM/SW Contact:    Toy LITTIE Agar, RN Phone Number:[phone]  08/10/2024, 4:16 PM  Clinical Narrative:                 Inpatient care manager following patient with high risk for readmission. Patient is from home with wife where he normally functions independently . Patient has PCP Maryhelen, Maude, MD ) and follows on a regular basis. CM will follow for home health recommendations.  Expected Discharge Plan: Home w Home Health Services Barriers to Discharge: Continued Medical Work up   Patient Goals and CMS Choice Patient states their goals for this hospitalization and ongoing recovery are:: Wants to go home with all needed home health. CMS Medicare.gov Compare Post Acute Care list provided to:: Patient Represenative (must comment) (wife) Choice offered to / list presented to : Patient, Spouse [place] ownership interest in [facility].provided to:: Patient    Expected Discharge Plan and Services In-house Referral: NA Discharge Planning Services: CM Consult Post Acute Care Choice: Home Health Living arrangements for the past 2 months: Single Family Home                 DME Arranged: N/A DME Agency: NA                  Prior Living Arrangements/Services Living arrangements for the past 2 months: Single Family Home Lives with:: Spouse Patient language and need for interpreter reviewed:: Yes Do you feel safe going back to the place where you live?: Yes      Need for Family Participation in Patient Care: Yes (Comment) Care giver support system in place?: Yes (comment) Current home services:  (n/a) Criminal Activity/Legal Involvement Pertinent to Current Situation/Hospitalization: No - Comment as needed  Activities of Daily Living      Permission Sought/Granted Permission sought to share  information with : Family Supports Permission granted to share information with : Yes, Verbal Permission Granted  Share Information with NAME: Perley Arthurs [phone]     Permission granted to share info w Relationship: spouse  Permission granted to share info w Contact Information: [phone]  Emotional Assessment Appearance:: Appears stated age Attitude/Demeanor/Rapport: Gracious Affect (typically observed): Pleasant, Quiet Orientation: : Oriented to Self, Oriented to Place, Oriented to  Time, Oriented to Situation Alcohol / Substance Use: Not Applicable Psych Involvement: No (comment)  Admission diagnosis:  Hyponatremia [E87.1] Sepsis due to undetermined organism (HCC) [A41.9] Leukopenia due to antineoplastic chemotherapy [D70.1, T45.1X5A] Fever, unspecified fever cause [R50.9] Neutropenia, unspecified type [D70.9] Sepsis, due to unspecified organism, unspecified whether acute organ dysfunction present [facility]) [A41.9] Patient Active Problem List   Diagnosis Date Noted   Protein-calorie malnutrition, severe 08/09/2024   Sepsis due to undetermined organism (HCC) 08/07/2024   Neutropenia with fever 08/07/2024   Pancytopenia (HCC) 08/07/2024   Hyponatremia 08/07/2024   Moderate protein malnutrition 08/07/2024   Transaminitis 08/07/2024   Oral thrush 08/07/2024   Multifocal pneumonia 08/07/2024   Sinus tachycardia 08/07/2024   Hypomagnesemia 08/07/2024   Hypophosphatemia 08/07/2024   Neutropenia 08/07/2024   Leukopenia due to antineoplastic chemotherapy 07/26/2024   Cancer related pain 06/08/2024   Primary squamous cell carcinoma of oropharynx (HCC) 06/07/2024   Neck mass 04/23/2024   Genetic testing 04/23/2023   Malignant neoplasm of prostate (HCC) 02/24/2023   Dysphonia 11/25/2022   Coronary artery disease involving  native coronary artery of native heart without angina pectoris    DOE (dyspnea on exertion)    PCP:  Windy Coy, MD Pharmacy:   [facility]  PHARMACY [id number] - [address] [place] - [address] [place] [address] Phone: [phone] Fax: [phone]  DARRYLE LONG - [facility] [address] [place] [address] Phone: [phone] Fax: [phone]     Social Drivers of Health (SDOH) Social History: SDOH Screenings   Food Insecurity: No Food Insecurity (08/07/2024)  Housing: Low Risk  (08/07/2024)  Transportation Needs: No Transportation Needs (08/07/2024)  Utilities: Not At Risk (08/07/2024)  Alcohol Screen: Low Risk  (02/24/2023)  Depression (PHQ2-9): Low Risk  (08/07/2024)  Social Connections: Moderately Isolated (08/07/2024)  Tobacco Use: Low Risk  (08/07/2024)   SDOH Interventions:     Readmission Risk Interventions    08/10/2024    3:54 PM  Readmission Risk Prevention Plan  Transportation Screening Complete  PCP or Specialist Appt within 3-5 Days Complete  HRI or Home Care Consult Complete  Social Work Consult for Recovery Care Planning/Counseling Complete  Palliative Care Screening Not Applicable  Medication Review Oceanographer) Referral to Pharmacy

## 2024-08-10 NOTE — Evaluation (Signed)
 Occupational Therapy Evaluation Patient Details Name: Gilbert Thompson MRN: [id number] DOB: [birth year] Today's Date: 08/10/2024   History of Present Illness   73 yr old male who [facility] with weakness, fever and hypotension, febrile, tachycardic. Patient found to have possible PNA. PMH: asthma, prostate cancer, CAD, GERD, hyperlipidemia, hypertension and primary squamous cell carcinoma of the oropharynx currently on radiation and chemotherapy, S/P PEG     Clinical Impressions The pt is presenting below his baseline level of functioning for self care management. He is limited by the below listed deficits (see OT problem list). At current, he requires CGA for tasks, including sit to stand, lower body dressing, and upper body grooming standing at the sink. He was also noted to be with slight deconditioning, mild generalized weakness, slight unsteadiness in standing, and reports of significant generalized as well as neck pain. He will benefit from further OT services to maximize his independence with ADLs and to decrease the risk for restricted participation in other meaningful activities. Home health OT is recommended.      If plan is discharge home, recommend the following:   Assist for transportation;Assistance with cooking/housework;Help with stairs or ramp for entrance     Functional Status Assessment   Patient has had a recent decline in their functional status and demonstrates the ability to make significant improvements in function in a reasonable and predictable amount of time.     Equipment Recommendations   Tub/shower seat;Other (comment) (Rolling walker)     Recommendations for Other Services         Precautions/Restrictions   Restrictions Weight Bearing Restrictions Per Provider Order: No Other Position/Activity Restrictions: PEG     Mobility Bed Mobility Overal bed mobility: Needs Assistance Bed Mobility: Supine to Sit, Sit to Supine      Supine to sit: Supervision Sit to supine: Supervision        Transfers Overall transfer level: Needs assistance Equipment used: None Transfers: Sit to/from Stand Sit to Stand: Contact guard assist                  Balance     Sitting balance-Leahy Scale: Good         Standing balance comment: CGA          ADL either performed or assessed with clinical judgement   ADL Overall ADL's : Needs assistance/impaired   Eating/Feeding Details (indicate cue type and reason): PEG Grooming: Contact guard assist;Standing   Upper Body Bathing: Set up;Sitting   Lower Body Bathing: Contact guard assist;Sit to/from stand   Upper Body Dressing : Set up;Sitting   Lower Body Dressing: Contact guard assist;Sit to/from stand   Toilet Transfer: Contact guard assist;Ambulation   Toileting- Clothing Manipulation and Hygiene: Contact guard assist;Sit to/from stand               Vision   Additional Comments: He correctly read the time depicted on the wall clock.            Pertinent Vitals/Pain Pain Assessment Pain Assessment: 0-10 Pain Score: 9  Pain Location: generalized and neck Pain Intervention(s): Limited activity within patient's tolerance, Monitored during session, Repositioned, Other (comment) (Nurse reported pt has had pain medication)     Extremity/Trunk Assessment Upper Extremity Assessment Upper Extremity Assessment: RUE deficits/detail;LUE deficits/detail;Left hand dominant RUE Deficits / Details: AROM WFL. Grip strength 4+/5 LUE Deficits / Details: AROM WFL. Grip strength 4+/5   Lower Extremity Assessment Lower Extremity Assessment: LLE deficits/detail;RLE deficits/detail RLE Deficits /  Details: AROM WFL LLE Deficits / Details: AROM WFL       Communication     Cognition Arousal: Alert Behavior During Therapy: WFL for tasks assessed/performed               OT - Cognition Comments: Oriented x4                 Following  commands: Intact Following commands impaired: Only follows one step commands consistently     Cueing  General Comments   Cueing Techniques: Verbal cues              Home Living Family/patient expects to be discharged to:: Private residence Living Arrangements: Spouse/significant other   Type of Home: House Home Access: Stairs to enter [employer] of Steps: 3-4 Entrance Stairs-Rails: Right Home Layout: One level     Bathroom Shower/Tub: Walk-in shower         Prior Functioning/Environment Prior Level of Function : Independent/Modified Independent;Driving             Mobility Comments: Independent with ambulation. ADLs Comments: Independent with ADLs and shared household chores with his spouse.    OT Problem List: Decreased strength;Impaired balance (sitting and/or standing);Decreased knowledge of use of DME or AE;Pain;Increased edema   OT Treatment/Interventions: Self-care/ADL training;Therapeutic exercise;Therapeutic activities;Energy conservation;Patient/family education;DME and/or AE instruction;Balance training      OT Goals(Current goals can be found in the care plan section)   Acute Rehab OT Goals Patient Stated Goal: to return home soon OT Goal Formulation: With patient Time For Goal Achievement: 08/24/24 Potential to Achieve Goals: Good ADL Goals Pt Will Perform Grooming: with modified independence;standing Pt Will Perform Lower Body Dressing: with modified independence;sitting/lateral leans;sit to/from stand Pt Will Transfer to Toilet: with modified independence;ambulating Pt Will Perform Toileting - Clothing Manipulation and hygiene: with modified independence;sit to/from stand   OT Frequency:  Min 2X/week       AM-PAC OT 6 Clicks Daily Activity     Outcome Measure Help from another person eating meals?: Total (PEG) Help from another person taking care of personal grooming?: A Little Help from another person toileting, which  includes using toliet, bedpan, or urinal?: A Little Help from another person bathing (including washing, rinsing, drying)?: A Little Help from another person to put on and taking off regular upper body clothing?: A Little Help from another person to put on and taking off regular lower body clothing?: A Little 6 Click Score: 16   End of Session Equipment Utilized During Treatment: Other (comment) Nurse Communication: Mobility status  Activity Tolerance: Patient limited by pain Patient left: in bed;with call bell/phone within reach;with bed alarm set  OT Visit Diagnosis: Pain;Muscle weakness (generalized) (M62.81);Unsteadiness on feet (R26.81) Pain - part of body:  (neck and generalized)                Time: 8349-8296 OT Time Calculation (min): 13 min Charges:  OT General Charges $OT Visit: 1 Visit OT Evaluation $OT Eval Moderate Complexity: 1 Mod    Lujean Ebright J Harris, OTR/L 08/10/2024, 5:22 PM

## 2024-08-11 ENCOUNTER — Other Ambulatory Visit: Payer: Self-pay

## 2024-08-11 ENCOUNTER — Inpatient Hospital Stay (HOSPITAL_COMMUNITY)

## 2024-08-11 ENCOUNTER — Ambulatory Visit

## 2024-08-11 DIAGNOSIS — A419 Sepsis, unspecified organism: Secondary | ICD-10-CM

## 2024-08-11 DIAGNOSIS — C109 Malignant neoplasm of oropharynx, unspecified: Secondary | ICD-10-CM | POA: Diagnosis not present

## 2024-08-11 DIAGNOSIS — D701 Agranulocytosis secondary to cancer chemotherapy: Secondary | ICD-10-CM | POA: Diagnosis not present

## 2024-08-11 DIAGNOSIS — E871 Hypo-osmolality and hyponatremia: Secondary | ICD-10-CM | POA: Diagnosis not present

## 2024-08-11 DIAGNOSIS — E43 Unspecified severe protein-calorie malnutrition: Secondary | ICD-10-CM | POA: Diagnosis not present

## 2024-08-11 DIAGNOSIS — D709 Neutropenia, unspecified: Secondary | ICD-10-CM | POA: Diagnosis not present

## 2024-08-11 LAB — CBC WITH DIFFERENTIAL/PLATELET
Abs Immature Granulocytes: 0.19 K/uL — ABNORMAL HIGH (ref 0.00–0.07)
Basophils Absolute: 0 K/uL (ref 0.0–0.1)
Basophils Relative: 1 %
Eosinophils Absolute: 0 K/uL (ref 0.0–0.5)
Eosinophils Relative: 0 %
HCT: 22.3 % — ABNORMAL LOW (ref 39.0–52.0)
Hemoglobin: 7.8 g/dL — ABNORMAL LOW (ref 13.0–17.0)
Immature Granulocytes: 7 %
Lymphocytes Relative: 19 %
Lymphs Abs: 0.5 K/uL — ABNORMAL LOW (ref 0.7–4.0)
MCH: 30.2 pg (ref 26.0–34.0)
MCHC: 35 g/dL (ref 30.0–36.0)
MCV: 86.4 fL (ref 80.0–100.0)
Monocytes Absolute: 0.1 K/uL (ref 0.1–1.0)
Monocytes Relative: 4 %
Neutro Abs: 1.8 K/uL (ref 1.7–7.7)
Neutrophils Relative %: 69 %
Platelets: 69 K/uL — ABNORMAL LOW (ref 150–400)
RBC: 2.58 MIL/uL — ABNORMAL LOW (ref 4.22–5.81)
RDW: 16.5 % — ABNORMAL HIGH (ref 11.5–15.5)
Smear Review: NORMAL
WBC: 2.6 K/uL — ABNORMAL LOW (ref 4.0–10.5)
nRBC: 0.8 % — ABNORMAL HIGH (ref 0.0–0.2)

## 2024-08-11 LAB — RAD ONC ARIA SESSION SUMMARY
Course Elapsed Days: 45
Plan Fractions Treated to Date: 33
Plan Prescribed Dose Per Fraction: 2 Gy
Plan Total Fractions Prescribed: 35
Plan Total Prescribed Dose: 70 Gy
Reference Point Dosage Given to Date: 66 Gy
Reference Point Session Dosage Given: 2 Gy
Session Number: 33

## 2024-08-11 LAB — CULTURE, RESPIRATORY W GRAM STAIN

## 2024-08-11 LAB — HEPATIC FUNCTION PANEL
ALT: 90 U/L — ABNORMAL HIGH (ref 0–44)
AST: 67 U/L — ABNORMAL HIGH (ref 15–41)
Albumin: 2.4 g/dL — ABNORMAL LOW (ref 3.5–5.0)
Alkaline Phosphatase: 67 U/L (ref 38–126)
Bilirubin, Direct: 0.2 mg/dL (ref 0.0–0.2)
Indirect Bilirubin: 0.2 mg/dL — ABNORMAL LOW (ref 0.3–0.9)
Total Bilirubin: 0.5 mg/dL (ref 0.0–1.2)
Total Protein: 5.8 g/dL — ABNORMAL LOW (ref 6.5–8.1)

## 2024-08-11 LAB — BASIC METABOLIC PANEL WITH GFR
Anion gap: 8 (ref 5–15)
BUN: 16 mg/dL (ref 8–23)
CO2: 25 mmol/L (ref 22–32)
Calcium: 8.2 mg/dL — ABNORMAL LOW (ref 8.9–10.3)
Chloride: 100 mmol/L (ref 98–111)
Creatinine, Ser: 0.83 mg/dL (ref 0.61–1.24)
GFR, Estimated: >60 mL/min (ref >60)
Glucose, Bld: 96 mg/dL (ref 70–99)
Potassium: 3.5 mmol/L (ref 3.5–5.1)
Sodium: 133 mmol/L — ABNORMAL LOW (ref 135–145)

## 2024-08-11 LAB — MAGNESIUM: Magnesium: 1.7 mg/dL (ref 1.7–2.4)

## 2024-08-11 LAB — GLUCOSE, CAPILLARY
Glucose-Capillary: 110 mg/dL — ABNORMAL HIGH (ref 70–99)
Glucose-Capillary: 110 mg/dL — ABNORMAL HIGH (ref 70–99)
Glucose-Capillary: 112 mg/dL — ABNORMAL HIGH (ref 70–99)
Glucose-Capillary: 84 mg/dL (ref 70–99)
Glucose-Capillary: 94 mg/dL (ref 70–99)
Glucose-Capillary: 97 mg/dL (ref 70–99)

## 2024-08-11 LAB — LEGIONELLA PNEUMOPHILA SEROGP 1 UR AG: L. pneumophila Serogp 1 Ur Ag: NEGATIVE

## 2024-08-11 MED ORDER — MORPHINE SULFATE (PF) 2 MG/ML IV SOLN
2.0000 mg | Freq: Once | INTRAVENOUS | Status: AC
  Administered 2024-08-11: 2 mg via INTRAVENOUS
  Filled 2024-08-11: qty 1

## 2024-08-11 MED ORDER — FUROSEMIDE 10 MG/ML IJ SOLN
20.0000 mg | Freq: Once | INTRAMUSCULAR | Status: AC
  Administered 2024-08-12: 20 mg via INTRAVENOUS
  Filled 2024-08-11: qty 2

## 2024-08-11 MED ORDER — LEVALBUTEROL HCL 0.63 MG/3ML IN NEBU
0.6300 mg | INHALATION_SOLUTION | Freq: Two times a day (BID) | RESPIRATORY_TRACT | Status: DC
  Administered 2024-08-12: 0.63 mg via RESPIRATORY_TRACT
  Filled 2024-08-11 (×2): qty 3

## 2024-08-11 MED ORDER — AMOXICILLIN-POT CLAVULANATE 875-125 MG PO TABS
1.0000 | ORAL_TABLET | Freq: Two times a day (BID) | ORAL | Status: DC
  Administered 2024-08-11 – 2024-08-12 (×3): 1 via ORAL
  Filled 2024-08-11 (×3): qty 1

## 2024-08-11 MED ORDER — MAGNESIUM SULFATE 2 GM/50ML IV SOLN
2.0000 g | Freq: Once | INTRAVENOUS | Status: AC
  Administered 2024-08-11: 2 g via INTRAVENOUS
  Filled 2024-08-11: qty 50

## 2024-08-11 MED ORDER — FUROSEMIDE 10 MG/ML IJ SOLN
20.0000 mg | Freq: Two times a day (BID) | INTRAMUSCULAR | Status: DC
  Administered 2024-08-11: 20 mg via INTRAVENOUS
  Filled 2024-08-11: qty 2

## 2024-08-11 NOTE — Progress Notes (Signed)
 Nutrition Follow-up  DOCUMENTATION CODES:   Severe malnutrition in context of acute illness/injury  INTERVENTION:   -Anticipate long-term need for enteral nutrition  Continue continuous regimen while admitted: -Osmolite 1.5 @ 65 ml/hr via G-tube -60 ml Prosource TF 20 daily (only while admitted) -Provides 2420 kcals, 118g protein and 1189 ml H2O  -Continue 100 mg thiamine  x7 days due to refeeding risk.  After discharge: -Recommend 1 carton (250 ml) Nutren 1.5 x 6 daily via gravity -Provides 2280 kcals, 102g protein and 1146 ml H2O -Flush with 30-60 ml free water  before and after each feeding -Additional free water : 100 ml QID via tube or PO if able  NUTRITION DIAGNOSIS:   Severe Malnutrition related to acute illness as evidenced by mild fat depletion, mild muscle depletion, energy intake < or equal to 50% for > or equal to 5 days, percent weight loss (7.5% in 1.5 months).  Ongoing.  GOAL:   Patient will meet greater than or equal to 90% of their needs  Meeting with TF  MONITOR:   Labs, Weight trends, TF tolerance  ASSESSMENT:   73 y.o. male with PMH significant for primary squamous cell carcinoma of the oropharynx currently on radiation and chemotherapy, asthma, Hx prostate cancer, CAD, GERD, HLD, HTN who presented with weakness, fever and hypotension to the cancer center. Admitted for sepsis.  11/17 Admit 11/18 SLP eval -> rec'd NPO  Patient and wife in room. Per wife, pt has been tolerating and doing well with continuous pump feedings. On dysphagia 1 diet but not taking in any POs given pain, dysphagia and taste changes.  RD would like to transition pt back to bolus regimen but pt and wife hesitant given difficulties with volume PTA.  Have coordinated with [facility], case manager and [employer] rep to ensure best plan for patient with anticipated discharge soon.  Revisited pt and wife with [facility] who has been following them outpatient. Discussed  options for home bolus feeding and they are open to continuing bolus gravity regimen once at home and [facility] to follow-up with pt to assess tolerance. Want to continue continuous pump feeds while still in hospital. No changes to original formula given 2.0 formulations may be slower to bolus given thickness.  Admission weight: 181 lbs Current weight: 163 lbs  Medications: vitamin D , Lasix , K-Phos, Thiamine , IV Mg sulfate, Magic mouthwash w/ lidocaine   Labs reviewed: CBGs: 84-110 Low sodium    Diet Order:   Diet Order             DIET - DYS 1 Room service appropriate? Yes; Fluid consistency: Thin  Diet effective now                   EDUCATION NEEDS:   Education needs have been addressed  Skin:  Skin Assessment: Reviewed RN Assessment  Last BM:  11/17  Height:   Ht Readings from Last 1 Encounters:  08/07/24 5' 9 (1.753 m)    Weight:   Wt Readings from Last 1 Encounters:  08/10/24 74.1 kg    BMI:  Body mass index is 24.12 kg/m.  Estimated Nutritional Needs:   Kcal:  2200-2450 kcals  Protein:  100-125 grams  Fluid:  >/= 2.2L   Morna Lee, MS, RD, LDN Inpatient Clinical Dietitian Contact via Secure chat

## 2024-08-11 NOTE — Progress Notes (Signed)
 PT Cancellation Note  Patient Details Name: Gilbert Thompson MRN: [id number] DOB: [birth year]   Cancelled Treatment:    Reason Eval/Treat Not Completed: Other (comment). PT returned at 1244 for intervention and MD entered room.  1253 Dietitian entered room. PT to return as schedule allows and continue to follow acutely.   [place], PT Acute Rehab   [name] 08/11/2024, 12:52 PM

## 2024-08-11 NOTE — Plan of Care (Signed)
  Problem: Activity: Goal: Risk for activity intolerance will decrease Outcome: Progressing   Problem: Coping: Goal: Level of anxiety will decrease Outcome: Progressing   Problem: Pain Managment: Goal: General experience of comfort will improve and/or be controlled Outcome: Progressing   Problem: Safety: Goal: Ability to remain free from injury will improve Outcome: Progressing

## 2024-08-11 NOTE — TOC Progression Note (Addendum)
 Transition of Care [facility]) - Progression Note    Patient Details  Name: Gilbert Thompson MRN: [id number] Date of Birth: [birth year]  [facility]) CM/SW Contact  Toy LITTIE Agar, RN Phone Number:[phone]  08/11/2024, 10:35 AM  Clinical Narrative:    HH referral has been accepted by Angie with [facility]  1042 Tentative plan is for patient to discharge with tube feeds. Final orders pending. Referral has been sent to [facility] with [employer] for tube feed supplies. CM will continue to follow.    Expected Discharge Plan: Home w Home Health Services Barriers to Discharge: Continued Medical Work up               Expected Discharge Plan and Services In-house Referral: NA Discharge Planning Services: CM Consult Post Acute Care Choice: Home Health Living arrangements for the past 2 months: Single Family Home                 DME Arranged: N/A DME Agency: NA                   Social Drivers of Health (SDOH) Interventions SDOH Screenings   Food Insecurity: No Food Insecurity (08/07/2024)  Housing: Low Risk  (08/07/2024)  Transportation Needs: No Transportation Needs (08/07/2024)  Utilities: Not At Risk (08/07/2024)  Alcohol Screen: Low Risk  (02/24/2023)  Depression (PHQ2-9): Low Risk  (08/07/2024)  Social Connections: Moderately Isolated (08/07/2024)  Tobacco Use: Low Risk  (08/07/2024)    Readmission Risk Interventions    08/10/2024    3:54 PM  Readmission Risk Prevention Plan  Transportation Screening Complete  PCP or Specialist Appt within 3-5 Days Complete  HRI or Home Care Consult Complete  Social Work Consult for Recovery Care Planning/Counseling Complete  Palliative Care Screening Not Applicable  Medication Review Oceanographer) Referral to Pharmacy

## 2024-08-11 NOTE — Progress Notes (Signed)
 PROGRESS NOTE    Gilbert Thompson  FMW:[id number] DOB: [birth year] DOA: 08/07/2024 PCP: Windy Coy, MD    Chief Complaint  Patient presents with   Weakness   Hypotension    Brief Narrative:  73 y.o. male with medical history significant of asthma, prostate cancer, CAD, GERD, hyperlipidemia, hypertension and primary squamous cell carcinoma of the oropharynx currently on radiation and chemotherapy (last chemotherapy 12 days prior to presentation) presented with weakness, fever and hypotension to the cancer center with blood pressure of 78/48.  In the ED, he was febrile, tachycardic.  COVID/influenza/RSV PCR negative.  WBC of 0.9, hemoglobin 8.4 and platelets of 41.  Sodium of 124 with normal renal function.  Lactic acid of 2.3.  Portable chest x-ray showed no acute findings.  CTA chest showed no pulmonary embolism but showed patchy airspace opacities suggestive of multilobar pneumonia.  He was started on IV fluids and antibiotics.  Oncology was consulted.    Assessment & Plan:   Principal Problem:   Sepsis due to undetermined organism [facility]) Active Problems:   Coronary artery disease involving native coronary artery of native heart without angina pectoris   Primary squamous cell carcinoma of oropharynx (HCC)   Leukopenia due to antineoplastic chemotherapy   Neutropenia with fever   Pancytopenia (HCC)   Hyponatremia   Moderate protein malnutrition   Transaminitis   Oral thrush   Multifocal pneumonia   Sinus tachycardia   Hypomagnesemia   Hypophosphatemia   Neutropenia   Protein-calorie malnutrition, severe   #1 severe sepsis secondary to multifocal pneumonia/neutropenic fever, POA -Patient noted to have met criteria for sepsis with fever, tachycardia, hypotension, pancytopenia and lactic acidosis felt secondary to multilobar pneumonia. - Preliminary sputum with few Staphylococcus aureus pending. - Blood cultures with no growth to date x 3 days. - MRSA PCR negative. - SARS  coronavirus 2 by PCR negative. - Influenza A by PCR negative - Influenza B by PCR negative. - RSV by PCR negative. -Urine strep pneumococcus antigen negative. - Fever curve trending down. - Neutropenia slowly improving. - Patient improving clinically. -Sputum Gram stain and culture with mixed flora and with few Staph aureus. -SLP following, patient noted to be increased risk for aspiration, patient s/p modified barium swallow during this hospitalization. - Continue empiric IV azithromycin  through today. - IV cefepime  discontinued.   - Patient currently on IV vancomycin  and Flagyl .   - Will discontinue IV vancomycin  and Flagyl  and placed on scheduled Augmentin  to complete a 7-day course of antibiotic treatment.  - Continue Breztri , Flonase , Xopenex  nebs, Claritin , PPI. - Supportive care.  2.  Primary squamous cell carcinoma of the oropharynx, pancytopenia -Patient undergoing radiation and chemotherapy last chemotherapy was 07/26/2024. - Continue filgrastim  as per oncology. - Status post transfusion 1 unit PRBCs (08/08/2024). - Counts slowly improving. - Oncology recommending continuation of radiation treatments daily and no further chemotherapy is planned at this time. - Transfusion threshold platelet count <20K. - Hemoglobin < 7k - Per hematology/oncology.  3.  Transaminitis -With a worsening transaminitis initially on 08/09/2024.  - Likely be secondary to Diflucan . - Discontinued Diflucan . - LFTs trending down with discontinuation of Diflucan .  - Right upper quadrant ultrasound with no focal liver lesion, right small pleural effusion. -Repeat labs in the AM. - Supportive care.  4.  Hyponatremia -Improved with hydration, free water  restriction.  5.  Lactic acidosis -Resolved.  6.  Acute metabolic acidosis - Resolved.   7.  Hypophosphatemia/hypomagnesemia/hypokalemia - Potassium at 3.5, magnesium  at 1.7. - Phosphorus  at 2.5. - Continue K-Phos 250 mg per tube twice daily  x 3 days. - Magnesium  sulfate 2 g IV x 1. -Repeat labs in the AM.  8.  CAD/hyperlipidemia -Stable.  - Continue to hold aspirin  secondary to thrombocytopenia. - Continue to hold ezetimibe  due to worsening LFTs.  9.  Hypocalcemia - improved.  10.  Oral thrush -Continue nystatin  swish and swallow. - Diflucan  discontinued due to transaminitis.    11.  Dysphagia -Patient being followed by SLP. - Patient underwent MBS on 08/09/2024 and speech recommended dysphagia 1 diet.  -Continue tube feeds via PEG tube.  12.  Severe protein calorie malnutrition -Being followed by RD. - Continue tube feeds of Osmolite 1.5 at 65 cc/h. - Continue Prosource TF 2060 mL daily. -Continue thiamine  as recommended by RD.    DVT prophylaxis: SCDs Code Status: Full Family Communication: Updated patient and wife at bedside.  Disposition: Likely home when clinically improved.  Status is: Inpatient Remains inpatient appropriate because: Severity of illness   Consultants:  Hematology/oncology: Dr. Autumn 08/08/2024  Procedures:  CT angiogram chest 08/07/2024 Chest x-ray 08/07/2024 Right upper quadrant ultrasound 08/11/2024  Antimicrobials:  Anti-infectives (From admission, onward)    Start     Dose/Rate Route Frequency Ordered Stop   08/11/24 1545  amoxicillin -clavulanate (AUGMENTIN ) 875-125 MG per tablet 1 tablet        1 tablet Oral Every 12 hours 08/11/24 1456 08/16/24 0959   08/10/24 2200  metroNIDAZOLE  (FLAGYL ) tablet 500 mg  Status:  Discontinued        500 mg Per Tube Every 12 hours 08/10/24 1512 08/11/24 1456   08/09/24 1600  vancomycin  (VANCOREADY) IVPB 1500 mg/300 mL  Status:  Discontinued        1,500 mg 150 mL/hr over 120 Minutes Intravenous Every 24 hours 08/09/24 1510 08/11/24 1456   08/09/24 1000  fluconazole  (DIFLUCAN ) tablet 100 mg  Status:  Discontinued        100 mg Oral Daily 08/08/24 1231 08/09/24 0801   08/08/24 1330  fluconazole  (DIFLUCAN ) tablet 200 mg        200 mg  Oral  Once 08/08/24 1231 08/08/24 1452   08/08/24 1330  fluconazole  (DIFLUCAN ) IVPB 200 mg        200 mg 100 mL/hr over 60 Minutes Intravenous  Once 08/08/24 1231 08/08/24 1548   08/07/24 2200  ceFEPIme  (MAXIPIME ) 2 g in sodium chloride  0.9 % 100 mL IVPB  Status:  Discontinued        2 g 200 mL/hr over 30 Minutes Intravenous Every 12 hours 08/07/24 1517 08/10/24 1508   08/07/24 1600  azithromycin  (ZITHROMAX ) 500 mg in sodium chloride  0.9 % 250 mL IVPB        500 mg 250 mL/hr over 60 Minutes Intravenous Every 24 hours 08/07/24 1517 08/11/24 1738   08/07/24 1030  ceFEPIme  (MAXIPIME ) 2 g in sodium chloride  0.9 % 100 mL IVPB        2 g 200 mL/hr over 30 Minutes Intravenous  Once 08/07/24 1021 08/07/24 1107   08/07/24 1030  metroNIDAZOLE  (FLAGYL ) IVPB 500 mg        500 mg 100 mL/hr over 60 Minutes Intravenous  Once 08/07/24 1021 08/07/24 1144   08/07/24 1030  vancomycin  (VANCOCIN ) IVPB 1000 mg/200 mL premix        1,000 mg 200 mL/hr over 60 Minutes Intravenous  Once 08/07/24 1021 08/07/24 1251         Subjective: Patient lying in bed.  Noted  to have returned from radiation treatment.  Denies any chest pain or shortness of breath.  No abdominal pain.  Tolerating dysphagia 1 diet.  Wife at bedside.  Patient noted to have a fever earlier this morning of 101.1 around 4:05 AM however patient states he was under multiple blankets and once he removed the blankets temperature improved.  Wife at bedside.   Objective: Vitals:   08/11/24 0405 08/11/24 0602 08/11/24 0832 08/11/24 1317  BP: 126/60   124/76  Pulse: 100   90  Resp: 18   16  Temp: (!) 101.1 F (38.4 C) 98.9 F (37.2 C)  99.2 F (37.3 C)  TempSrc: Oral Oral  Oral  SpO2: 95%  94% 96%  Weight:      Height:        Intake/Output Summary (Last 24 hours) at 08/11/2024 1748 Last data filed at 08/11/2024 1638 Gross per 24 hour  Intake 1574 ml  Output 1600 ml  Net -26 ml   Filed Weights   08/07/24 2035 08/09/24 0500 08/10/24 0853   Weight: 82.4 kg 74.1 kg 74.1 kg    Examination:  General exam: NAD.  Oral thrush improving.  Respiratory system: Improved coarse breath sounds on the right.  No wheezing.  No crackles.  Fair air movement.  Speaking in full sentences.  Cardiovascular system: Regular rate rhythm no murmurs rubs or gallops.  No JVD.  No lower extremity edema.  Gastrointestinal system: Abdomen is soft, nontender, nondistended, positive bowel sounds.  PEG tube in place.  Central nervous system: Alert and oriented. No focal neurological deficits. Extremities: Symmetric 5 x 5 power. Skin: No rashes, lesions or ulcers Psychiatry: Judgement and insight appear normal. Mood & affect appropriate.     Data Reviewed: I have personally reviewed following labs and imaging studies  CBC: Recent Labs  Lab 08/07/24 1035 08/07/24 2142 08/08/24 0405 08/08/24 0857 08/09/24 0355 08/10/24 0553 08/11/24 0600  WBC 0.9*  --  0.8*  --  1.5* 1.6* 2.6*  NEUTROABS 0.4*  --  0.5*  --  1.1* 1.1* 1.8  HGB 8.4*   < > 7.7* 8.5* 8.9* 8.3* 7.8*  HCT 23.6*   < > 22.0* 23.2* 25.3* 24.1* 22.3*  MCV 89.4  --  86.6  --  84.9 88.0 86.4  PLT 41*  --  39*  --  58* 62* 69*   < > = values in this interval not displayed.    Basic Metabolic Panel: Recent Labs  Lab 08/07/24 1506 08/08/24 0405 08/08/24 1744 08/09/24 0355 08/09/24 1731 08/10/24 0553 08/11/24 0600  NA 127* 131*  --  135  --  135 133*  K 3.8 4.1  --  3.5  --  3.4* 3.5  CL 98 98  --  100  --  102 100  CO2 14* 24  --  21*  --  24 25  GLUCOSE 81 190*  --  111*  --  132* 96  BUN 9 15  --  22  --  21 16  CREATININE 0.48* 0.93  --  1.09  --  0.93 0.83  CALCIUM  6.7* 8.4*  --  9.2  --  8.6* 8.2*  MG 0.7* 2.2 1.9 2.0 1.7 1.7 1.7  PHOS <1.0* 4.2 3.0 2.4* 1.8* 2.5  --     GFR: Estimated Creatinine Clearance: 78.1 mL/min (by C-G formula based on SCr of 0.83 mg/dL).  Liver Function Tests: Recent Labs  Lab 08/07/24 1035 08/08/24 0405 08/09/24 0355 08/10/24 0553  08/11/24  0930  AST 72* 65* 127* 114* 67*  ALT 77* 68* 103* 116* 90*  ALKPHOS 64 51 57 69 67  BILITOT 0.6 0.5 0.5 0.4 0.5  PROT 6.2* 5.5* 6.1* 5.9* 5.8*  ALBUMIN 2.9* 2.4* 2.6* 2.5* 2.4*    CBG: Recent Labs  Lab 08/11/24 0009 08/11/24 0407 08/11/24 0758 08/11/24 1126 08/11/24 1629  GLUCAP 97 94 110* 84 110*     Recent Results (from the past 240 hours)  Blood Culture (routine x 2)     Status: None (Preliminary result)   Collection Time: 08/07/24 10:36 AM   Specimen: BLOOD  Result Value Ref Range Status   Specimen Description   Final    BLOOD RIGHT ANTECUBITAL Performed at [facility], [address], [place], [place] [address]    Special Requests   Final    BOTTLES DRAWN AEROBIC AND ANAEROBIC Blood Culture results may not be optimal due to an inadequate volume of blood received in culture bottles Performed at [facility], [address], [place], [place] [address]    Culture   Final    NO GROWTH 4 DAYS Performed at [facility], [address] [place], [place] [address]    Report Status PENDING  Incomplete  Blood Culture (routine x 2)     Status: None (Preliminary result)   Collection Time: 08/07/24 10:36 AM   Specimen: BLOOD  Result Value Ref Range Status   Specimen Description   Final    BLOOD BLOOD RIGHT FOREARM Performed at [facility], [address], [place], [place] [address]    Special Requests   Final    BOTTLES DRAWN AEROBIC ONLY Blood Culture results may not be optimal due to an inadequate volume of blood received in culture bottles Performed at [facility], [address] [place], [place] [address]    Culture   Final    NO GROWTH 4 DAYS Performed at [facility], [address], [place], [place] [address]    Report Status PENDING  Incomplete  Resp panel by RT-PCR (RSV, Flu A&B, Covid) Anterior Nasal Swab     Status: None   Collection Time: 08/07/24 11:40 AM    Specimen: Anterior Nasal Swab  Result Value Ref Range Status   SARS Coronavirus 2 by RT PCR NEGATIVE NEGATIVE Final    Comment: (NOTE) SARS-CoV-2 target nucleic acids are NOT DETECTED.  The SARS-CoV-2 RNA is generally detectable in upper respiratory specimens during the acute phase of infection. The lowest concentration of SARS-CoV-2 viral copies this assay can detect is 138 copies/mL. A negative result does not preclude SARS-Cov-2 infection and should not be used as the sole basis for treatment or other patient management decisions. A negative result may occur with  improper specimen collection/handling, submission of specimen other than nasopharyngeal swab, presence of viral mutation(s) within the areas targeted by this assay, and inadequate number of viral copies(<138 copies/mL). A negative result must be combined with clinical observations, patient history, and epidemiological information. The expected result is Negative.  Fact Sheet for Patients:  bloggercourse.com  Fact Sheet for Healthcare Providers:  seriousbroker.it  This test is no t yet approved or cleared by the [place]  FDA and  has been authorized for detection and/or diagnosis of SARS-CoV-2 by FDA under an Emergency Use Authorization (EUA). This EUA will remain  in effect (meaning this test can be used) for the duration of the COVID-19 declaration under Section 564(b)(1) of  the Act, 21 U.S.C.section 360bbb-3(b)(1), unless the authorization is terminated  or revoked sooner.       Influenza A by PCR NEGATIVE NEGATIVE Final   Influenza B by PCR NEGATIVE NEGATIVE Final    Comment: (NOTE) The Xpert Xpress SARS-CoV-2/FLU/RSV plus assay is intended as an aid in the diagnosis of influenza from Nasopharyngeal swab specimens and should not be used as a sole basis for treatment. Nasal washings and aspirates are unacceptable for Xpert Xpress  SARS-CoV-2/FLU/RSV testing.  Fact Sheet for Patients: bloggercourse.com  Fact Sheet for Healthcare Providers: seriousbroker.it  This test is not yet approved or cleared by the [place]  FDA and has been authorized for detection and/or diagnosis of SARS-CoV-2 by FDA under an Emergency Use Authorization (EUA). This EUA will remain in effect (meaning this test can be used) for the duration of the COVID-19 declaration under Section 564(b)(1) of the Act, 21 U.S.C. section 360bbb-3(b)(1), unless the authorization is terminated or revoked.     Resp Syncytial Virus by PCR NEGATIVE NEGATIVE Final    Comment: (NOTE) Fact Sheet for Patients: bloggercourse.com  Fact Sheet for Healthcare Providers: seriousbroker.it  This test is not yet approved or cleared by the [place]  FDA and has been authorized for detection and/or diagnosis of SARS-CoV-2 by FDA under an Emergency Use Authorization (EUA). This EUA will remain in effect (meaning this test can be used) for the duration of the COVID-19 declaration under Section 564(b)(1) of the Act, 21 U.S.C. section 360bbb-3(b)(1), unless the authorization is terminated or revoked.  Performed at [facility], [address] [place], [place] [address]   MRSA Next Gen by PCR, Nasal     Status: None   Collection Time: 08/07/24  8:31 PM   Specimen: Nasal Mucosa; Nasal Swab  Result Value Ref Range Status   MRSA by PCR Next Gen NOT DETECTED NOT DETECTED Final    Comment: (NOTE) The GeneXpert MRSA Assay (FDA approved for NASAL specimens only), is one component of a comprehensive MRSA colonization surveillance program. It is not intended to diagnose MRSA infection nor to guide or monitor treatment for MRSA infections. Test performance is not FDA approved in patients less than 65 years old. Performed at [facility], [address], [place], [place] [address]   Expectorated Sputum Assessment w Gram Stain, Rflx to Resp Cult     Status: None   Collection Time: 08/08/24  2:25 AM   Specimen: Expectorated Sputum  Result Value Ref Range Status   Specimen Description EXPECTORATED SPUTUM  Final   Special Requests NONE  Final   Sputum evaluation   Final    Sputum specimen not acceptable for testing.  Please recollect.   NOTIFIED MATHEW HERO RN OF RECOLLECT @ [phone] ON 08/08/2024 BY MTA Performed at [facility], [address], [place], [place] [address]    Report Status 08/08/2024 FINAL  Final  Expectorated Sputum Assessment w Gram Stain, Rflx to Resp Cult     Status: None   Collection Time: 08/08/24  9:15 AM  Result Value Ref Range Status   Specimen Description EXPECTORATED SPUTUM  Final   Special Requests NONE  Final   Sputum evaluation   Final    THIS SPECIMEN IS ACCEPTABLE FOR SPUTUM CULTURE Performed at [facility], [address] [place], [place] [address]    Report Status 08/08/2024 FINAL  Final  Culture, Respiratory w Gram Stain     Status: None   Collection Time:  08/08/24  9:15 AM  Result Value Ref Range Status   Specimen Description   Final    EXPECTORATED SPUTUM Performed at [facility], [address], [place], [place] [address]    Special Requests   Final    NONE Reflexed from [id number] Performed at [facility], [address], [place], [place] [address]    Gram Stain   Final    ABUNDANT WBC PRESENT, PREDOMINANTLY PMN MODERATE GRAM POSITIVE RODS    Culture   Final    FEW STAPHYLOCOCCUS AUREUS WITHIN MIXED FLORA Performed at [facility], [address], [place], [place] [address]    Report Status 08/11/2024 FINAL  Final   Organism ID, Bacteria STAPHYLOCOCCUS AUREUS  Final      Susceptibility   Staphylococcus aureus - MIC*    CIPROFLOXACIN <=0.5 SENSITIVE Sensitive     ERYTHROMYCIN <=0.25  SENSITIVE Sensitive     GENTAMICIN <=0.5 SENSITIVE Sensitive     OXACILLIN 0.5 SENSITIVE Sensitive     TETRACYCLINE <=1 SENSITIVE Sensitive     VANCOMYCIN  1 SENSITIVE Sensitive     TRIMETH /SULFA  <=10 SENSITIVE Sensitive     CLINDAMYCIN <=0.25 SENSITIVE Sensitive     RIFAMPIN <=0.5 SENSITIVE Sensitive     Inducible Clindamycin NEGATIVE Sensitive     LINEZOLID 2 SENSITIVE Sensitive     * FEW STAPHYLOCOCCUS AUREUS         Radiology Studies: US  Abdomen Limited RUQ (LIVER/GB) Result Date: 08/11/2024 CLINICAL DATA:  Transaminitis EXAM: ULTRASOUND ABDOMEN LIMITED RIGHT UPPER QUADRANT COMPARISON:  PET-CT February 22, 2023. FINDINGS: Gallbladder: No gallstones or wall thickening visualized. No sonographic Murphy sign noted by sonographer. Common bile duct: Diameter: 4 mm Liver: No focal lesion identified. Normal parenchymal echogenicity. Portal vein is patent on color Doppler imaging with normal direction of blood flow towards the liver. Other: Incidental note was made of right-sided pleural effusion. IMPRESSION: No focal liver lesion. Right small pleural effusion. Electronically Signed   By: Megan  Zare M.D.   On: 08/11/2024 14:45        Scheduled Meds:  allopurinol   300 mg Per Tube Daily   amoxicillin -clavulanate  1 tablet Oral Q12H   budesonide -glycopyrrolate -formoterol   2 puff Inhalation BID   Chlorhexidine  Gluconate Cloth  6 each Topical Daily   cholecalciferol   1,000 Units Per Tube Daily   feeding supplement (PROSource TF20)  60 mL Per Tube Daily   finasteride   5 mg Oral Daily   fluticasone   2 spray Each Nare Daily   [START ON 08/12/2024] furosemide   20 mg Intravenous Once   levalbuterol   0.63 mg Nebulization BID   loratadine   10 mg Per Tube Daily   morphine   15 mg Per Tube Q6H   nystatin   5 mL Oral QID   mouth rinse  15 mL Mouth Rinse 4 times per day   pantoprazole  (PROTONIX ) IV  40 mg Intravenous Q12H   phosphorus  250 mg Per Tube BID   pneumococcal 20-valent conjugate vaccine   0.5 mL Intramuscular Tomorrow-1000   thiamine   100 mg Per Tube Daily   Continuous Infusions:  feeding supplement (OSMOLITE 1.5 CAL) Stopped (08/11/24 0001)     LOS: 4 days    Time spent: 40 minutes    Toribio Hummer, MD [facility]   To contact the attending provider between 7A-7P or the covering provider during after hours 7P-7A, please log into the web site [email or web address] and access using universal [place] password for that web site.  If you do not have the password, please call the hospital operator.  08/11/2024, 5:48 PM

## 2024-08-11 NOTE — Progress Notes (Signed)
 PHARMACY NOTE:  ANTIMICROBIAL RENAL DOSAGE ADJUSTMENT  Current antimicrobial regimen includes a mismatch between antimicrobial dosage and estimated renal function.  As per policy approved by the Pharmacy & Therapeutics and Medical Executive Committees, the antimicrobial dosage will be adjusted accordingly.  Current antimicrobial dosage:  vancomycin  1500mg  IV q24h  Indication: pneumonia (staph aureus growing on resp cx)  Renal Function:  Estimated Creatinine Clearance: 78.1 mL/min (by C-G formula based on SCr of 0.83 mg/dL). []      On intermittent HD, scheduled: []      On CRRT    Antimicrobial dosage has been changed to:  vancomycin  1750mg  IV q24h (eAUC 473 using Scr 0.83, Vd 0.72)  Additional comments: N/A   Thank you for allowing pharmacy to be a part of this patient's care.  Lacinda Moats, PharmD Clinical Pharmacist  11/21/20259:22 AM

## 2024-08-11 NOTE — Plan of Care (Signed)
  Problem: Clinical Measurements: Goal: Respiratory complications will improve Outcome: Progressing   Problem: Elimination: Goal: Will not experience complications related to urinary retention Outcome: Progressing   Problem: Pain Managment: Goal: General experience of comfort will improve and/or be controlled Outcome: Progressing   Problem: Clinical Measurements: Goal: Ability to maintain a body temperature in the normal range will improve Outcome: Progressing   Problem: Respiratory: Goal: Ability to maintain a clear airway will improve Outcome: Progressing

## 2024-08-11 NOTE — Plan of Care (Incomplete)
  Problem: Education: Goal: Knowledge of General Education information will improve Description: Including pain rating scale, medication(s)/side effects and non-pharmacologic comfort measures Outcome: Progressing   Problem: Clinical Measurements: Goal: Respiratory complications will improve Outcome: Progressing Goal: Cardiovascular complication will be avoided Outcome: Progressing   Problem: Health Behavior/Discharge Planning: Goal: Ability to manage health-related needs will improve Outcome: Not Progressing   Problem: Clinical Measurements: Goal: Ability to maintain clinical measurements within normal limits will improve Outcome: Not Progressing Goal: Will remain free from infection Outcome: Not Progressing Goal: Diagnostic test results will improve Outcome: Not Progressing

## 2024-08-11 NOTE — Plan of Care (Signed)
  Problem: Education: Goal: Knowledge of General Education information will improve Description: Including pain rating scale, medication(s)/side effects and non-pharmacologic comfort measures Outcome: Progressing   Problem: Clinical Measurements: Goal: Respiratory complications will improve Outcome: Progressing Goal: Cardiovascular complication will be avoided Outcome: Progressing   Problem: Health Behavior/Discharge Planning: Goal: Ability to manage health-related needs will improve Outcome: Not Progressing   Problem: Clinical Measurements: Goal: Ability to maintain clinical measurements within normal limits will improve Outcome: Not Progressing Goal: Will remain free from infection Outcome: Not Progressing Goal: Diagnostic test results will improve Outcome: Not Progressing

## 2024-08-11 NOTE — Progress Notes (Signed)
 PT Cancellation Note  Patient Details Name: Gilbert Thompson MRN: [id number] DOB: Aug 19, 1951   Cancelled Treatment:    Reason Eval/Treat Not Completed: Patient at procedure or test/unavailable. PT arrived 1026 and pt at radiation. PT to return as schedule allows and continue to follow acutely.   [place], PT Acute Rehab   [name] 08/11/2024, 10:31 AM

## 2024-08-11 NOTE — Progress Notes (Signed)
 [facility]  HEMATOLOGY/ONCOLOGY IN-PATIENT PROGRESS NOTE   PATIENT NAME: Gilbert Thompson   MR#: [id number] DOB: April 11, 1951 CSN#: [id number]   DATE OF SERVICE: 08/11/2024  ASSESSMENT & PLAN:   Gilbert Thompson is a pleasant 73 y.o. gentleman who is well-known to our service for his primary squamous cell carcinoma of the oropharynx, stage I disease with extranodal extension, currently received concurrent chemoradiation, last chemotherapy on 07/26/2024, further chemotherapy held because of neutropenia despite G-CSF support.  He presented to the clinic on 08/07/24 for IV fluids that he has been receiving at least 3 days a week.  He was noted to be hypotensive and given generalized weakness, subjective fevers, he was directed to the ED for further evaluation.  Clinical picture concerning for sepsis, likely from pneumonia of right upper and middle lobes, in the setting of neutropenia.  Neutropenic fever/ bacterial pneumonia of right upper and middle lobes in the setting of neutropenia Pneumonia in the right upper and middle lobes, likely bacterial, given negative respiratory panel for flu, COVID, and RSV.  - Because of positive neutropenia, we started him on Zarxio .  ANC went up to more than 1000 as of 08/10/2024 and Zarxio  was discontinued.  Patient remains on antibiotics. -White count 2600 today with ANC of 1800. -Clinically improved significantly.  Malignant neoplasm under active management with chemotherapy and radiation -Last chemotherapy with cisplatin  cycle 5 was on 07/26/2024.  Further chemotherapy was withheld because of neutropenia and has been receiving Zarxio  support in our clinic.   - Currently undergoing radiation therapy. Radiation therapy to be completed as planned. - Continue radiation therapy - We will hold further chemotherapy.  Oropharyngeal mucositis Persistent sore throat and difficulty swallowing, limiting oral intake to a few sips of water . Condition  expected to improve with discontinuation of chemotherapy. - Continue supportive care for mucositis.  Dysphagia Difficulty swallowing due to oropharyngeal mucositis. Continuous nutrition via pump is beneficial, reducing aspiration risk compared to bolus feeding. - Consulted with dietitian Elvie regarding home use of the nutrition pump. - Will arrange for rental of nutrition pump if necessary.  Malnutrition Weight loss noted, likely due to dysphagia and reduced oral intake. Continuous nutrition via pump is improving nutritional status. - Continue continuous nutrition via pump to support nutritional needs.   Rest of the management is as per primary team.  Please call us  with any questions or concerns.   [name], MD 08/11/2024 6:05 PM  ONCOLOGY HISTORY:    He presented to his PCP with complains of chronic pharyngitis and a left submandibular mass that has persisted over the past year.   To further investigate his symptoms, he underwent a CT neck and CT chest on 04/06/24 showing a 3.0 x 2.4 x 2.5 cm soft tissue mass centered in the left oropharynx at the glossotonsillar sulcus with possible involvement of the adjacent tongue base. 7 mm low-attenuation left level IB lymph node likely reflecting a site of nodal metastatic disease. 1.7 x 1.7 cm irregular soft tissue focus at the left level II station, inseparable from the adjacent sternocleidomastoid muscle. This is suspicious for nodal metastatic disease (with findings concerning for extracapsular extension) or a tumor deposit. 9 mm short-axis low attenuation left level III lymph node likely reflecting a site of nodal metastatic disease. Scans also noted scattered tiny pulmonary nodules are unchanged and likely benign but with only 5 months of documented stability but with recommend attention on follow-up given his history of prostate cancer.    Subsequently,  the patient was referred to Dr. Graig on 04/24/24 who performed a FNA biopsy of left  neck. However, biopsy was inconclusive. As a result, he then underwent a laryngoscopy with biopsy on 05/15/24 under the care of Dr. Lauralee. 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 Tumor is moderately differentiated with foci of poor differentiation and invasion into the striated muscle. Immunohistochemical studies are p16, p40, and HRHPV are positive.    He had a cervical spine MRI performed on 05/09/24 revealing no evidence of metastatic disease to the cervical spine itself but did indicated degenerative spondylosis at C4-5 and C5-6. 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 PET scan performed on 04/26/24 showing FDG avid mass along the left tongue base/oropharynx with left cervical chain nodal metastatic disease with no suspicious pulmonary or mediastinal FDG uptake.   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    Prior cancers, if any: history of prostate cancer.   Given extracapsular extension with lymph node positive disease, plan made to proceed with concurrent chemoradiation.   Plan made for weekly cisplatin  with radiation, if renal function allows.  Otherwise we will use either docetaxel or carboplatin/paclitaxel regimen.   Started concurrent chemoradiation with weekly cisplatin  from 06/27/2024.   Creatinine went up to 1.44 on 07/12/2024.  Dose reduced cisplatin  by 50% with cycle 3 as a result.  With normalization of creatinine, cisplatin  was resumed at standard dose from cycle 4 onwards.  Cycle 5 of chemotherapy was given on 07/26/2024.  Further chemotherapy held because of neutropenia.  SUBJECTIVE:   Patient seen and evaluated.  His wife was by the bedside.  He has been having some tremors.  He could not contribute much to the history because of speech difficulty.  His wife contributed to the history.  She  reports that he has been severely fatigued especially over the weekend.  Today we tried to give him IV fluids in the clinic but he remained hypotensive and hence was directed to the ED.  Had subjective fever, generalized weakness and hypotension.  Once in the ED, he was found to be septic, from presumed aspiration pneumonia in the right upper and right middle lobes.  On broad-spectrum antibiotics.  OBJECTIVE:  Vitals:   08/11/24 0832 08/11/24 1317  BP:  124/76  Pulse:  90  Resp:  16  Temp:  99.2 F (37.3 C)  SpO2: 94% 96%     Intake/Output Summary (Last 24 hours) at 08/11/2024 1805 Last data filed at 08/11/2024 1638 Gross per 24 hour  Intake 1574 ml  Output 1600 ml  Net -26 ml    Physical Exam Constitutional:      General: He is not in acute distress. HENT:     Head: Normocephalic and atraumatic.  Eyes:     Conjunctiva/sclera: Conjunctivae normal.  Cardiovascular:     Rate and Rhythm: Normal rate.  Pulmonary:     Effort: Pulmonary effort is normal. No respiratory distress.  Abdominal:     General: There is no distension.  Neurological:     Mental Status: He is alert.    LABS:   Results for orders placed or performed during the hospital encounter of 08/07/24 (from the past 24 hours)  Glucose, capillary     Status: None   Collection Time: 08/10/24  7:49 PM  Result Value Ref Range  Glucose-Capillary 96 70 - 99 mg/dL  Glucose, capillary     Status: None   Collection Time: 08/11/24 12:09 AM  Result Value Ref Range   Glucose-Capillary 97 70 - 99 mg/dL  Glucose, capillary     Status: None   Collection Time: 08/11/24  4:07 AM  Result Value Ref Range   Glucose-Capillary 94 70 - 99 mg/dL  CBC with Differential/Platelet     Status: Abnormal   Collection Time: 08/11/24  6:00 AM  Result Value Ref Range   WBC 2.6 (L) 4.0 - 10.5 K/uL   RBC 2.58 (L) 4.22 - 5.81 MIL/uL   Hemoglobin 7.8 (L) 13.0 - 17.0 g/dL   HCT 77.6 (L) 60.9 - 47.9 %   MCV 86.4 80.0 - 100.0 fL   MCH  30.2 26.0 - 34.0 pg   MCHC 35.0 30.0 - 36.0 g/dL   RDW 83.4 (H) 88.4 - 84.4 %   Platelets 69 (L) 150 - 400 K/uL   nRBC 0.8 (H) 0.0 - 0.2 %   Neutrophils Relative % 69 %   Neutro Abs 1.8 1.7 - 7.7 K/uL   Lymphocytes Relative 19 %   Lymphs Abs 0.5 (L) 0.7 - 4.0 K/uL   Monocytes Relative 4 %   Monocytes Absolute 0.1 0.1 - 1.0 K/uL   Eosinophils Relative 0 %   Eosinophils Absolute 0.0 0.0 - 0.5 K/uL   Basophils Relative 1 %   Basophils Absolute 0.0 0.0 - 0.1 K/uL   WBC Morphology See Note    RBC Morphology MORPHOLOGY UNREMARKABLE    Smear Review Normal platelet morphology    Immature Granulocytes 7 %   Abs Immature Granulocytes 0.19 (H) 0.00 - 0.07 K/uL  Magnesium      Status: None   Collection Time: 08/11/24  6:00 AM  Result Value Ref Range   Magnesium  1.7 1.7 - 2.4 mg/dL  Basic metabolic panel     Status: Abnormal   Collection Time: 08/11/24  6:00 AM  Result Value Ref Range   Sodium 133 (L) 135 - 145 mmol/L   Potassium 3.5 3.5 - 5.1 mmol/L   Chloride 100 98 - 111 mmol/L   CO2 25 22 - 32 mmol/L   Glucose, Bld 96 70 - 99 mg/dL   BUN 16 8 - 23 mg/dL   Creatinine, Ser 9.16 0.61 - 1.24 mg/dL   Calcium  8.2 (L) 8.9 - 10.3 mg/dL   GFR, Estimated >39 >39 mL/min   Anion gap 8 5 - 15  Glucose, capillary     Status: Abnormal   Collection Time: 08/11/24  7:58 AM  Result Value Ref Range   Glucose-Capillary 110 (H) 70 - 99 mg/dL  Hepatic function panel     Status: Abnormal   Collection Time: 08/11/24  9:30 AM  Result Value Ref Range   Total Protein 5.8 (L) 6.5 - 8.1 g/dL   Albumin 2.4 (L) 3.5 - 5.0 g/dL   AST 67 (H) 15 - 41 U/L   ALT 90 (H) 0 - 44 U/L   Alkaline Phosphatase 67 38 - 126 U/L   Total Bilirubin 0.5 0.0 - 1.2 mg/dL   Bilirubin, Direct 0.2 0.0 - 0.2 mg/dL   Indirect Bilirubin 0.2 (L) 0.3 - 0.9 mg/dL  Glucose, capillary     Status: None   Collection Time: 08/11/24 11:26 AM  Result Value Ref Range   Glucose-Capillary 84 70 - 99 mg/dL  Glucose, capillary     Status:  Abnormal   Collection Time: 08/11/24  4:29 PM  Result Value Ref Range   Glucose-Capillary 110 (H) 70 - 99 mg/dL     IMAGING STUDIES:   US  Abdomen Limited RUQ (LIVER/GB) Result Date: 08/11/2024 CLINICAL DATA:  Transaminitis EXAM: ULTRASOUND ABDOMEN LIMITED RIGHT UPPER QUADRANT COMPARISON:  PET-CT February 22, 2023. FINDINGS: Gallbladder: No gallstones or wall thickening visualized. No sonographic Murphy sign noted by sonographer. Common bile duct: Diameter: 4 mm Liver: No focal lesion identified. Normal parenchymal echogenicity. Portal vein is patent on color Doppler imaging with normal direction of blood flow towards the liver. Other: Incidental note was made of right-sided pleural effusion. IMPRESSION: No focal liver lesion. Right small pleural effusion. Electronically Signed   By: Megan  Zare M.D.   On: 08/11/2024 14:45   DG Swallowing Func-Speech Pathology Result Date: 08/09/2024 Table formatting from the original result was not included. Modified Barium Swallow Study Patient Details Name: Gilbert Thompson MRN: [id number] Date of Birth: 09/24/1950 Today's Date: 08/09/2024 HPI/PMH: HPI: pt is a 73 yo male adm to [facility] with hypotension, poor intake.  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 He is undergoing concurrent chemoradiation. Last chemotherapy with cisplatin  cycle 5 was on 07/26/2024. Further chemotherapy was withheld because of neutropenia and has been receiving Zarxio  support in our clinic. Pt received a PEG tube for nutrition and reports having consumed po and using feeding tube for nutrition. 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  He admits odynophagia and dysphagia are equal parts responsible for his poor intake. Pt CXR  08/07/2024: Patchy airspace opacities, especially in the lower lobes, but also in the right upper lobe and right middle lobe potentially from atypical or multilobar pneumonia. Clinical Impression: Clinical Impression: Patient presents with mild oropharyngeal dysphagia likely due to his oropharyngeal mass, iatrogenic effects of XRT and his bilateral vocal fold atrophy.  These combined issues with edema result in decreased airway closure allowing trace aspiration of thin liquid with nonproductive cough response.  Aspiration noted most especially occuring early in the study - as barium spilled into open airway prior to swallow being triggered.  SLP questions some component of warm up effect. Further episodes of laryngeal penetration were inconsistent and only trace.  Mild retention noted in pharynx (more at tongue base with solids) post-swallow due to decreased tongue base retraction and inadequate mastication. Pt c/o severe xerostomia causing foods to adhere to his oral structures  No aspiration or penetration with cracker, puree or honey thick liquids.   Pharyngeal clearance with puree was excellent, likely due to combined weight of bolus allowing improved neuro input.  Various postures were not helpful to mitigate retention or aspiration - including chin tuck *tucked approx 20* due to neck stiffness* and head turn left.  Liquid swallows are more effective than dry swallows which are not really functional.  At this time, recommend pt be able to start po diet with strict precautions.  If pt is reflexively coughing during intake, it is likely due to aspiration.  Given he has PEG tube for nutrition, recommend close monitoring of po diet via temperatures, lung sound monitoring, etc. Expect swallow to improve several week after XRT is completed. Factors that may increase risk of adverse event in presence of aspiration Noe & Lianne 2021): Factors that  may increase risk of adverse event in presence of aspiration  Noe & Lianne 2021): Reduced saliva; Poor general health and/or compromised immunity Recommendations/Plan: Swallowing Evaluation Recommendations Swallowing Evaluation Recommendations Recommendations: Dysphagia 1 (Pureed);(EXTRA GRAVY/SAUCES - CREAMY PUREES!) Thin liquids (Level 0 )=  CONSIDER WATER  WITH MEALS Liquid Administration via: Cup; Straw Medication Administration: Crushed with puree (or via PEG) Swallowing strategies : Start intake with WATER ; Small bites/sips. Follow solid w/liquid. Stop intake if overtly coughing. Clear throat intermittently Treatment Plan Treatment Plan Treatment recommendations: Therapy as outlined in treatment plan below Follow-up recommendations: Follow physicians's recommendations for discharge plan and follow up therapies Functional status assessment: Patient has had a recent decline in their functional status and demonstrates the ability to make significant improvements in function in a reasonable and predictable amount of time. Treatment frequency: Min 1x/week Treatment duration: 1 week Interventions: Aspiration precaution training; Patient/family education; Oropharyngeal exercises; Respiratory muscle strength training Recommendations Recommendations for follow up therapy are one component of a multi-disciplinary discharge planning process, led by the attending physician.  Recommendations may be updated based on patient status, additional functional criteria and insurance authorization. Assessment: Orofacial Exam: Orofacial Exam Oral Cavity: Oral Hygiene: Xerostomia; Lingual coating; Edema; Erythema Oral Cavity - Dentition: Adequate natural dentition Orofacial Anatomy: Other (comment) (pt has known left tonsillar mass close to uvula and down to glossotonsillar sulcus) Anatomy: Anatomy: Suspected cervical osteophytes Boluses Administered: Boluses Administered Boluses Administered: Thin liquids (Level 0); Mildly thick liquids (Level 2, nectar thick); Puree; Moderately thick  liquids (Level 3, honey thick); Solid  Oral Impairment Domain: Oral Impairment Domain Lip Closure: No labial escape Tongue control during bolus hold: Cohesive bolus between tongue to palatal seal Bolus preparation/mastication: Minimal chewing/mashing with majority of bolus unchewed Bolus transport/lingual motion: Slow tongue motion Oral residue: Trace residue lining oral structures Location of oral residue : Palate; Tongue Initiation of pharyngeal swallow : Posterior laryngeal surface of the epiglottis; Pyriform sinuses; Valleculae  Pharyngeal Impairment Domain: Pharyngeal Impairment Domain Soft palate elevation: No bolus between soft palate (SP)/pharyngeal wall (PW) Laryngeal elevation: Partial superior movement of thyroid  cartilage/partial approximation of arytenoids to epiglottic petiole Anterior hyoid excursion: Partial anterior movement Epiglottic movement: Partial inversion Laryngeal vestibule closure: Incomplete, narrow column air/contrast in laryngeal vestibule Pharyngeal stripping wave : Present - diminished Pharyngeal contraction (A/P view only): N/A Pharyngoesophageal segment opening: Partial distention/partial duration, partial obstruction of flow Tongue base retraction: Narrow column of contrast or air between tongue base and PPW Pharyngeal residue: Collection of residue within or on pharyngeal structures; Trace residue within or on pharyngeal structures Location of pharyngeal residue: Tongue base; Valleculae (tongue base and vallecular more than pharyngeal wall and pyriform sinus)  Esophageal Impairment Domain: Esophageal Impairment Domain Esophageal clearance upright position: Esophageal retention Pill: Pill Consistency administered: -- (DNT) Penetration/Aspiration Scale Score: Penetration/Aspiration Scale Score 1.  Material does not enter airway: Moderately thick liquids (Level 3, honey thick); Puree; Solid 2.  Material enters airway, remains ABOVE vocal cords then ejected out: Mildly thick liquids  (Level 2, nectar thick) 7.  Material enters airway, passes BELOW cords and not ejected out despite cough attempt by patient: Thin liquids (Level 0) Compensatory Strategies: Compensatory Strategies Compensatory strategies: Yes Chin tuck: Ineffective Ineffective Chin Tuck: Thin liquid (Level 0) Liquid wash: Effective Left head turn: Ineffective Ineffective Left Head Turn: Mildly thick liquid (Level 2, nectar thick)   General Information: Caregiver present: Yes  Diet Prior to this Study: NPO; Other (Comment) (ice chips)   Temperature : Normal   Respiratory Status: [facility]  Supplemental O2: None (Room air)   History of Recent Intubation: No  Behavior/Cognition: Alert; Cooperative; Pleasant mood Self-Feeding Abilities: Able to self-feed Baseline vocal quality/speech: Dysphonic Volitional Cough: Able to elicit Volitional Swallow: Able to elicit Exam Limitations: No limitations Goal Planning: Prognosis for improved oropharyngeal function: Good No data recorded No data recorded Patient/Family Stated Goal: He admits odynophagia and dysphagia are equal parts responsible for his poor intake. No data recorded Pain: Pain Assessment Pain Assessment: No/denies pain Faces Pain Scale: 2 Pain Location: oropharynx Pain Descriptors / Indicators: Aching; Burning Pain Intervention(s): Limited activity within patient's tolerance; Other (comment) (RN provided medication during session) End of Session: Start Time:SLP Start Time (ACUTE ONLY): 1210 Stop Time: SLP Stop Time (ACUTE ONLY): 1235 Time Calculation:SLP Time Calculation (min) (ACUTE ONLY): 25 min Charges: SLP Evaluations $ SLP Speech Visit: 1 Visit SLP Evaluations $BSS Swallow: 1 Procedure $MBS Swallow: 1 Procedure $Swallowing Treatment: 1 Procedure SLP visit diagnosis: SLP Visit Diagnosis: Dysphagia, oropharyngeal phase (R13.12) Past Medical History: Past Medical History: Diagnosis Date  Asthma   Cancer (HCC)   prostate cancer  Coronary artery disease   pt states they were unable to  place a stent at that time  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, follwed by [facility]  Dyspnea   with exertion  GERD (gastroesophageal reflux disease)   Hyperlipidemia   Hypertension   Pneumonia  Past Surgical History: Past Surgical History: Procedure Laterality Date  CATARACT EXTRACTION Bilateral   IR GASTROSTOMY TUBE MOD SED  06/23/2024  IR IMAGING GUIDED PORT INSERTION  06/23/2024  LEFT HEART CATH AND CORONARY ANGIOGRAPHY N/A 06/05/2022  Procedure: LEFT HEART CATH AND CORONARY ANGIOGRAPHY;  Surgeon: Claudene Victory ORN, MD;  Location: [facility] INVASIVE CV LAB;  Service: Cardiovascular;  Laterality: N/A;  LYMPHADENECTOMY Bilateral 04/29/2023  Procedure: BILATERAL PELVIC LYMPHADENECTOMY;  Surgeon: Renda Glance, MD;  Location: [facility] ORS;  Service: Urology;  Laterality: Bilateral;  PROSTATE BIOPSY    ROBOT ASSISTED LAPAROSCOPIC RADICAL PROSTATECTOMY N/A 04/29/2023  Procedure: XI ROBOTIC ASSISTED LAPAROSCOPIC RADICAL PROSTATECTOMY LEVEL 2;  Surgeon: Renda Glance, MD;  Location: [facility] ORS;  Service: Urology;  Laterality: N/A;  210 MINUTES NEEDED FOR CASE  UPPER GASTROINTESTINAL ENDOSCOPY    VENTRAL HERNIA REPAIR N/A 10/05/2023  Procedure: LAPAROSCOPIC VENTRAL HERNIA REPAAIR WITH MESH;  Surgeon: Rubin Calamity, MD;  Location: [facility] OR;  Service: General;  Laterality: N/A; [name], MS [facility] SLP Acute Rehab Services Office [phone] [name] 08/09/2024, 2:55 PM  CT Angio Chest PE W/Cm &/Or Wo Cm Result Date: 08/07/2024 EXAM: CTA CHEST 08/07/2024 01:30:55 PM TECHNIQUE: CTA of the chest was performed after the administration of intravenous contrast. Multiplanar reformatted images are provided for review. MIP images are provided for review. Automated exposure control, iterative reconstruction, and/or weight based adjustment of the mA/kV was utilized to reduce the radiation dose to as low as reasonably achievable. COMPARISON: None available. CLINICAL HISTORY: Shortness of breath, squamous cell carcinoma of the left tonsil  and tongue base undergoing chemoradiation; also prostate cancer, hypotension. * Tracking Code: BO * right port a cath tip colon SVC. FINDINGS: PULMONARY ARTERIES: Pulmonary arteries are adequately opacified for evaluation. No acute pulmonary embolus. Main pulmonary artery is normal in caliber. MEDIASTINUM: Mild cardiomegaly. Thoracic aortic, coronary artery, and branch vessel atheromatous vascular calcifications. There is no acute abnormality of the thoracic aorta. LYMPH NODES: Right high lung node 1.3 cm in short axis on image 62 series 5, formerly 0.7 cm. No mediastinal or axillary lymphadenopathy. LUNGS AND PLEURA: Bilateral  airway thickening noted. Patchy and nodular regions of airspace opacity in the right upper lobe, right middle lobe, and confluent in both lower lobes especially dependently raising the possibility of multilobar pneumonia. There is also some faint ground glass opacity posteriorly in the lingula. No evidence of pleural effusion or pneumothorax. UPPER ABDOMEN: Limited images of the upper abdomen are unremarkable. Peg tube noted. SOFT TISSUES AND BONES: Thoracic spondylosis. No acute bone or soft tissue abnormality. IMPRESSION: 1. No pulmonary embolism. 2. Patchy airspace opacities, especially in the lower lobes, but also in the right upper lobe and right middle lobe potentially from atypical or multilobar pneumonia. 3. Enlarged right high lung lymph node measuring 1.3 cm short axis, increased from 0.7 cm. This is likely reactive given the findings in the lung, although surveillance is suggested. 4. Mild cardiomegaly. 5. Thoracic aortic and coronary artery atherosclerosis. 6. Bilateral airway thickening. 7. Thoracic spondylosis. Electronically signed by: Ryan Salvage MD 08/07/2024 02:26 PM EST RP Workstation: [id number]   DG Chest Port 1 View Result Date: 08/07/2024 CLINICAL DATA:  Questionable sepsis. Head and neck cancer, hypotension with decreased O2 sats. EXAM: PORTABLE CHEST 1 VIEW  COMPARISON:  09/28/2022.  CT chest 04/06/2024. FINDINGS: Right IJ Port-A-Cath tip is at the SVC RA junction. Lungs are clear. No pleural fluid. IMPRESSION: No acute findings. Electronically Signed   By: Newell Eke M.D.   On: 08/07/2024 10:59

## 2024-08-11 NOTE — Progress Notes (Addendum)
 Speech Language Pathology Treatment: Dysphagia  Patient Details Name: Gilbert Thompson MRN: [id number] DOB: 08/27/1951 Today's Date: 08/11/2024 Time: 8844-8784 SLP Time Calculation (min) (ACUTE ONLY): 20 min  Assessment / Plan / Recommendation Clinical Impression  Pt seen for SLP follow up to address dyphagia - wife present in room with him and both endorse his intake has been poor as he is complaining of continued choking with intake, odynophagia and states his mouth just doesn't want it dysgeusia.   SLP remeasured him for trismus - today maximum jaw ROM was 25 mm, which remains subpar.   Reviewed with his wife to repeat measurement of mandibular ROM a few months after radiation and to fashion trismus apparatus for a few mm above maximum if pt is having decreased ROM.   Wife inquired re: thickener for pt's liquids to ease swallow, reviewed MBS that demonstrated less aspiration with nectar - and provided sample packets to pt to use PRN. Discussed other option including liquids that are naturally slightly thicker * peach nectar, Ensure, buttermilk, etc. but encouraged him to continue to consume water  to help maintain neuro motor planning for thin liquid swallows. Encouraged him to continue po intake as able - using his magic mouth was as he needs it.     Life Vac advised to be purchased for emergent use. Implored to pt importance of him having PO Intake when he recovers from his XR and to continue doing his head/neck ROM exercises to maximize his rehab potential and decrease fibrosis. Using teach back, pt and wife educated and agreeable. Thanks for this consult - no acute SLP follow up needed.     HPI HPI: pt is a 73 yo male adm to [facility] with hypotension, poor intake.  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 He is undergoing concurrent chemoradiation. Last chemotherapy with cisplatin  cycle 5 was on 07/26/2024. Further chemotherapy was withheld because of neutropenia and has been receiving Zarxio  support in our clinic. Pt received a PEG tube for nutrition and reports having consumed po and using feeding tube for nutrition. 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  He admits odynophagia and dysphagia are equal parts responsible for his poor intake. Pt CXR 08/07/2024: Patchy airspace opacities, especially in the lower lobes, but also in the right upper lobe and right middle lobe potentially from atypical or multilobar pneumonia.      SLP Plan  Continue with current plan of care          Recommendations  Diet recommendations: Dysphagia 1 (puree);Thin liquid Liquids provided via: Cup;Straw Medication Administration: Via alternative means Supervision: Staff to assist with self feeding Compensations: Slow rate;Small sips/bites;Other (Comment);Follow solids with liquid Postural Changes and/or Swallow Maneuvers: Seated upright 90 degrees;Upright 30-60 min after meal                  Oral care BID   Frequent or constant Supervision/Assistance Dysphagia, oropharyngeal phase (R13.12)     Continue with current plan of care     [name]  08/11/2024, 2:47 PM

## 2024-08-12 ENCOUNTER — Other Ambulatory Visit (HOSPITAL_COMMUNITY): Payer: Self-pay

## 2024-08-12 DIAGNOSIS — E440 Moderate protein-calorie malnutrition: Secondary | ICD-10-CM

## 2024-08-12 DIAGNOSIS — D709 Neutropenia, unspecified: Secondary | ICD-10-CM | POA: Diagnosis not present

## 2024-08-12 DIAGNOSIS — E871 Hypo-osmolality and hyponatremia: Secondary | ICD-10-CM | POA: Diagnosis not present

## 2024-08-12 DIAGNOSIS — A419 Sepsis, unspecified organism: Secondary | ICD-10-CM | POA: Diagnosis not present

## 2024-08-12 LAB — CBC WITH DIFFERENTIAL/PLATELET
Abs Immature Granulocytes: 0.19 K/uL — ABNORMAL HIGH (ref 0.00–0.07)
Basophils Absolute: 0 K/uL (ref 0.0–0.1)
Basophils Relative: 0 %
Eosinophils Absolute: 0 K/uL (ref 0.0–0.5)
Eosinophils Relative: 0 %
HCT: 24.5 % — ABNORMAL LOW (ref 39.0–52.0)
Hemoglobin: 8.4 g/dL — ABNORMAL LOW (ref 13.0–17.0)
Immature Granulocytes: 6 %
Lymphocytes Relative: 20 %
Lymphs Abs: 0.7 K/uL (ref 0.7–4.0)
MCH: 30 pg (ref 26.0–34.0)
MCHC: 34.3 g/dL (ref 30.0–36.0)
MCV: 87.5 fL (ref 80.0–100.0)
Monocytes Absolute: 0.1 K/uL (ref 0.1–1.0)
Monocytes Relative: 4 %
Neutro Abs: 2.3 K/uL (ref 1.7–7.7)
Neutrophils Relative %: 70 %
Platelets: 96 K/uL — ABNORMAL LOW (ref 150–400)
RBC: 2.8 MIL/uL — ABNORMAL LOW (ref 4.22–5.81)
RDW: 16.7 % — ABNORMAL HIGH (ref 11.5–15.5)
WBC: 3.3 K/uL — ABNORMAL LOW (ref 4.0–10.5)
nRBC: 0 % (ref 0.0–0.2)

## 2024-08-12 LAB — GLUCOSE, CAPILLARY
Glucose-Capillary: 130 mg/dL — ABNORMAL HIGH (ref 70–99)
Glucose-Capillary: 131 mg/dL — ABNORMAL HIGH (ref 70–99)
Glucose-Capillary: 133 mg/dL — ABNORMAL HIGH (ref 70–99)
Glucose-Capillary: 134 mg/dL — ABNORMAL HIGH (ref 70–99)

## 2024-08-12 LAB — COMPREHENSIVE METABOLIC PANEL WITH GFR
ALT: 88 U/L — ABNORMAL HIGH (ref 0–44)
AST: 72 U/L — ABNORMAL HIGH (ref 15–41)
Albumin: 2.6 g/dL — ABNORMAL LOW (ref 3.5–5.0)
Alkaline Phosphatase: 84 U/L (ref 38–126)
Anion gap: 11 (ref 5–15)
BUN: 18 mg/dL (ref 8–23)
CO2: 25 mmol/L (ref 22–32)
Calcium: 8.4 mg/dL — ABNORMAL LOW (ref 8.9–10.3)
Chloride: 96 mmol/L — ABNORMAL LOW (ref 98–111)
Creatinine, Ser: 0.84 mg/dL (ref 0.61–1.24)
GFR, Estimated: >60 mL/min (ref >60)
Glucose, Bld: 115 mg/dL — ABNORMAL HIGH (ref 70–99)
Potassium: 3.3 mmol/L — ABNORMAL LOW (ref 3.5–5.1)
Sodium: 132 mmol/L — ABNORMAL LOW (ref 135–145)
Total Bilirubin: 0.4 mg/dL (ref 0.0–1.2)
Total Protein: 6.3 g/dL — ABNORMAL LOW (ref 6.5–8.1)

## 2024-08-12 LAB — CULTURE, BLOOD (ROUTINE X 2)
Culture: NO GROWTH
Culture: NO GROWTH

## 2024-08-12 LAB — MAGNESIUM: Magnesium: 1.8 mg/dL (ref 1.7–2.4)

## 2024-08-12 MED ORDER — NYSTATIN 100000 UNIT/ML MT SUSP: 5.0000 mL | Freq: Four times a day (QID) | OROMUCOSAL | 0 refills | Status: DC

## 2024-08-12 MED ORDER — MAGIC MOUTHWASH W/LIDOCAINE
10.0000 mL | Freq: Four times a day (QID) | ORAL | Status: DC
  Administered 2024-08-12 (×2): 10 mL via ORAL
  Filled 2024-08-12 (×4): qty 10

## 2024-08-12 MED ORDER — ALBUTEROL SULFATE HFA 108 (90 BASE) MCG/ACT IN AERS: INHALATION_SPRAY | RESPIRATORY_TRACT | 0 refills | Status: AC

## 2024-08-12 MED ORDER — LORATADINE 10 MG PO TABS: 10.0000 mg | ORAL_TABLET | Freq: Every day | ORAL | 0 refills | Status: DC

## 2024-08-12 MED ORDER — POTASSIUM CHLORIDE CRYS ER 20 MEQ PO TBCR
40.0000 meq | EXTENDED_RELEASE_TABLET | Freq: Once | ORAL | Status: AC
  Administered 2024-08-12: 40 meq via ORAL
  Filled 2024-08-12: qty 2

## 2024-08-12 MED ORDER — HEPARIN SOD (PORK) LOCK FLUSH 100 UNIT/ML IV SOLN
500.0000 [IU] | Freq: Once | INTRAVENOUS | Status: AC
  Administered 2024-08-12: 500 [IU] via INTRAVENOUS
  Filled 2024-08-12: qty 5

## 2024-08-12 MED ORDER — FLUTICASONE PROPIONATE 50 MCG/ACT NA SUSP: 2.0000 | Freq: Every day | NASAL | 0 refills | Status: AC

## 2024-08-12 MED ORDER — ACETAMINOPHEN 325 MG PO TABS: 650.0000 mg | ORAL_TABLET | Freq: Four times a day (QID) | ORAL | Status: AC | PRN

## 2024-08-12 MED ORDER — LEVALBUTEROL HCL 0.63 MG/3ML IN NEBU
0.6300 mg | INHALATION_SOLUTION | Freq: Four times a day (QID) | RESPIRATORY_TRACT | Status: DC | PRN
Start: 2024-08-12 — End: 2024-08-12

## 2024-08-12 MED ORDER — AMOXICILLIN-POT CLAVULANATE 875-125 MG PO TABS: 1.0000 | ORAL_TABLET | Freq: Two times a day (BID) | ORAL | 0 refills | Status: AC

## 2024-08-12 NOTE — Progress Notes (Signed)
 Physical Therapy Treatment Patient Details Name: Gilbert Thompson MRN: [id number] DOB: 01/13/1951 Today's Date: 08/12/2024   History of Present Illness 73 y.o. presents from Cancer center with weakness, fever and hypotension febrile, tachycardic  Sodium of 124 with normal renal function.  Lactic acid of 2.3.  TA chest showed no pulmonary embolism but showed patchy airspace opacities suggestive of multilobar pneumonia. PMH: asthma, prostate cancer, CAD, GERD, hyperlipidemia, hypertension and primary squamous cell carcinoma of the oropharynx currently on radiation and chemotherapy, S/P PEG    PT Comments  Pt amb 150 ft with RW, CGA progressing to supv, initially narrow BOS and device too far in front of pt but pt improves both with verbal cues. Progressed to rollator device, pt amb 150 ft with rollator, able to manage brakes appropriately once educated, safe device management, no overt LOB or unsteadiness or noted. Pt able to complete transfers with rollator device, supv with good brake management. Updated d/c rec to rollator device; continue to recommend HHPT with spouse support.   If plan is discharge home, recommend the following: A little help with walking and/or transfers;A little help with bathing/dressing/bathroom;Help with stairs or ramp for entrance;Assist for transportation   Can travel by private vehicle        Equipment Recommendations  Rollator (4 wheels)    Recommendations for Other Services       Precautions / Restrictions Precautions Precautions: Fall Precaution/Restrictions Comments: PEG and port Restrictions Weight Bearing Restrictions Per Provider Order: No     Mobility  Bed Mobility Overal bed mobility: Modified Independent             General bed mobility comments: increased time and effort, light bedrail use, modified ind    Transfers Overall transfer level: Needs assistance Equipment used: Rolling walker (2 wheels), Rollator (4 wheels) Transfers: Sit  to/from Stand Sit to Stand: Supervision           General transfer comment: cues for hand placement    Ambulation/Gait Ambulation/Gait assistance: Contact guard assist, Supervision Gait Distance (Feet): 150 Feet (x2) Assistive device: Rolling walker (2 wheels), Rollator (4 wheels) Gait Pattern/deviations: Step-through pattern, Decreased stride length, Narrow base of support Gait velocity: decreased but functional     General Gait Details: step through gait pattern with RW, narrow BOS at times but improves with cues, cues to maintain body closer to device; pt amb 150 ft with RW initially requiring CGA and progressing to supv and pt amb 150 ft with rollator and supv for safety   Stairs             Wheelchair Mobility     Tilt Bed    Modified Rankin (Stroke Patients Only)       Balance Overall balance assessment: Needs assistance, History of Falls Sitting-balance support: Feet supported, No upper extremity supported Sitting balance-Leahy Scale: Good     Standing balance support: During functional activity, Bilateral upper extremity supported, Reliant on assistive device for balance Standing balance-Leahy Scale: Poor                              Communication Communication Communication: No apparent difficulties  Cognition Arousal: Alert Behavior During Therapy: WFL for tasks assessed/performed   PT - Cognitive impairments: No apparent impairments                         Following commands: Intact      Cueing  Cueing Techniques: Verbal cues  Exercises      General Comments        Pertinent Vitals/Pain Pain Assessment Pain Assessment: No/denies pain    Home Living                          Prior Function            PT Goals (current goals can now be found in the care plan section) Acute Rehab PT Goals Patient Stated Goal: to walk PT Goal Formulation: With patient/family Time For Goal Achievement:  08/23/24 Potential to Achieve Goals: Good Progress towards PT goals: Progressing toward goals    Frequency    Min 3X/week      PT Plan      Co-evaluation              AM-PAC PT 6 Clicks Mobility   Outcome Measure  Help needed turning from your back to your side while in a flat bed without using bedrails?: None Help needed moving from lying on your back to sitting on the side of a flat bed without using bedrails?: None Help needed moving to and from a bed to a chair (including a wheelchair)?: A Little Help needed standing up from a chair using your arms (e.g., wheelchair or bedside chair)?: A Little Help needed to walk in hospital room?: A Little Help needed climbing 3-5 steps with a railing? : A Little 6 Click Score: 20    End of Session Equipment Utilized During Treatment: Gait belt Activity Tolerance: Patient tolerated treatment well Patient left: in bed;with call bell/phone within reach;with bed alarm set;with family/visitor present Nurse Communication: Mobility status PT Visit Diagnosis: Unsteadiness on feet (R26.81);Muscle weakness (generalized) (M62.81)     Time: 8588-8557 PT Time Calculation (min) (ACUTE ONLY): 31 min  Charges:    $Gait Training: 23-37 mins PT General Charges $$ ACUTE PT VISIT: 1 Visit                     Tori Karlo Goeden PT, DPT 08/12/24, 2:50 PM

## 2024-08-12 NOTE — TOC Progression Note (Signed)
 Transition of Care [facility]) - Progression Note    Patient Details  Name: Gilbert Thompson MRN: [id number] Date of Birth: 07/01/1951  Transition of Care [facility]) CM/SW Contact  Jon ONEIDA Anon, RN Phone Number: 08/12/2024, 1:41 PM  Clinical Narrative:    RNCM spoke with Holley Herring, [employer] liaison and she states that home Tube feeds will not be set up for home until Monday, needing insurance approval. MD made aware. IP CM will continue to follow.      Expected Discharge Plan: Home w Home Health Services Barriers to Discharge: Continued Medical Work up               Expected Discharge Plan and Services In-house Referral: NA Discharge Planning Services: CM Consult Post Acute Care Choice: Home Health Living arrangements for the past 2 months: Single Family Home                 DME Arranged: N/A DME Agency: NA                   Social Drivers of Health (SDOH) Interventions SDOH Screenings   Food Insecurity: No Food Insecurity (08/07/2024)  Housing: Low Risk  (08/07/2024)  Transportation Needs: No Transportation Needs (08/07/2024)  Utilities: Not At Risk (08/07/2024)  Alcohol Screen: Low Risk  (02/24/2023)  Depression (PHQ2-9): Low Risk  (08/07/2024)  Social Connections: Moderately Isolated (08/07/2024)  Tobacco Use: Low Risk  (08/07/2024)    Readmission Risk Interventions    08/10/2024    3:54 PM  Readmission Risk Prevention Plan  Transportation Screening Complete  PCP or Specialist Appt within 3-5 Days Complete  HRI or Home Care Consult Complete  Social Work Consult for Recovery Care Planning/Counseling Complete  Palliative Care Screening Not Applicable  Medication Review Oceanographer) Referral to Pharmacy

## 2024-08-12 NOTE — TOC Transition Note (Addendum)
 Transition of Care [facility]) - Discharge Note   Patient Details  Name: Gilbert Thompson MRN: [id number] Date of Birth: [birth year]  Transition of Care [facility]) CM/SW Contact:  Jon ONEIDA Anon, RN Phone Number: 08/12/2024, 2:15 PM   Clinical Narrative:    Pt to discharge home with Home Health services provided through [facility]. HH PT/OT/RN set up with [facility], liaison with [facility]. Pt will need TF at home and Holley Herring with [employer] is working on orders to secure TF in the home. Pt spouse made aware that TF not set up today and that it will likely be Monday/Tuesday before TF can be delivered to the home. Pt spouse voices understanding and wishes for pt to DC home today. Pt spouse will provide transportation at DC. No further IP CM needs at this time. Will sign off.  Addendum:  PT recommending Rollator at DC. Orders for Rollator placed. Orders sent to Jermaine with [employer]. Will be delivered to pt room prior to being discharged.     Final next level of care: Home w Home Health Services Barriers to Discharge: Barriers Resolved   Patient Goals and CMS Choice Patient states their goals for this hospitalization and ongoing recovery are:: Wants to go home with all needed home health. CMS Medicare.gov Compare Post Acute Care list provided to:: Patient Represenative (must comment) (Pt spouse) Choice offered to / list presented to : Spouse [place] ownership interest in [facility]provided to:: Spouse    Discharge Placement                  Name of family member notified: Koben, Daman (Spouse)  [phone] Patient and family notified of of transfer: 08/12/24  Discharge Plan and Services Additional resources added to the After Visit Summary for   In-house Referral: NA Discharge Planning Services: CM Consult Post Acute Care Choice: Home Health          DME Arranged: Tube feeding DME Agency: Other - Comment [facility]) Date DME Agency Contacted:  08/12/24 Time DME Agency Contacted: 1414 Representative spoke with at DME Agency: Holley Herring HH Arranged: RN, PT, OT [facility] Agency: Other - See comment [facility]) Date HH Agency Contacted: 08/12/24 Time HH Agency Contacted: 1415 Representative spoke with at [facility] Agency: [facility]  Social Drivers of Health (SDOH) Interventions SDOH Screenings   Food Insecurity: No Food Insecurity (08/07/2024)  Housing: Low Risk  (08/07/2024)  Transportation Needs: No Transportation Needs (08/07/2024)  Utilities: Not At Risk (08/07/2024)  Alcohol Screen: Low Risk  (02/24/2023)  Depression (PHQ2-9): Low Risk  (08/07/2024)  Social Connections: Moderately Isolated (08/07/2024)  Tobacco Use: Low Risk  (08/07/2024)     Readmission Risk Interventions    08/10/2024    3:54 PM  Readmission Risk Prevention Plan  Transportation Screening Complete  PCP or Specialist Appt within 3-5 Days Complete  HRI or Home Care Consult Complete  Social Work Consult for Recovery Care Planning/Counseling Complete  Palliative Care Screening Not Applicable  Medication Review Oceanographer) Referral to Pharmacy

## 2024-08-12 NOTE — Discharge Summary (Signed)
 Physician Discharge Summary  [name]:[id number] DOB: [birth year] DOA: 08/07/2024  PCP: Windy Coy, MD  Admit date: 08/07/2024 Discharge date: 08/12/2024  Time spent: 60 minutes  Recommendations for Outpatient Follow-up:  Follow-up with Windy Coy, MD in 1 to 2 weeks.  On follow-up patient will need a comprehensive metabolic profile, magnesium  level, phosphorus level done to follow-up on electrolytes and renal function.  Patient will need a CBC done to follow-up on counts.  Resumption of patient's aspirin  will need to be determined on follow-up as well as resumption of patient's cholesterol medications.  Oral thrush will need to be followed up upon. Follow-up with Dr. Autumn, oncology as scheduled.   Discharge Diagnoses:  Principal Problem:   Sepsis due to undetermined organism [facility]) Active Problems:   Coronary artery disease involving native coronary artery of native heart without angina pectoris   Primary squamous cell carcinoma of oropharynx (HCC)   Leukopenia due to antineoplastic chemotherapy   Neutropenia with fever   Pancytopenia (HCC)   Hyponatremia   Moderate protein malnutrition   Transaminitis   Oral thrush   Multifocal pneumonia   Sinus tachycardia   Hypomagnesemia   Hypophosphatemia   Neutropenia   Protein-calorie malnutrition, severe   Sepsis (HCC)   Discharge Condition: Stable and improved.  Diet recommendation: Dysphagia 1 diet with thin liquids and aspiration precautions/tube feeds.  Filed Weights   08/09/24 0500 08/10/24 0853 08/12/24 0500  Weight: 74.1 kg 74.1 kg 83.6 kg    History of present illness:  HPI per Dr. Celinda Marcey [name] is a 73 y.o. male with medical history significant of asthma, prostate cancer, CAD, dyspnea on exertion, GERD, hyperlipidemia, hypertension, pneumonia primary skulls no cell carcinoma of the oropharynx on radiation and chemotherapy who presented emergency department from the cancer center with  subjective fever, generalized weakness and hypotension.  Over there he had blood pressure 78/48 mmHg and was hypoxic at 89%.  His last radiation therapy was 3 days ago on Friday and last chemo 12 days ago.  The patient was found to be febrile in the emergency department.  He has been having increased sore throat, productive cough, dyspnea and pleuritic chest pain. He denied rhinorrhea or hemoptysis.  No palpitations, diaphoresis, PND, orthopnea or pitting edema of the lower extremities.  No abdominal pain, emesis, diarrhea, constipation, melena or hematochezia.  No flank pain, dysuria, frequency or hematuria.  No polyuria, polydipsia, polyphagia or blurred vision.    Lab work: Coronavirus, influenza and RSV PCR test was negative.  CBC showed a white count of 8.9, hemoglobin 8.4 g/dL and platelets 41.  Normal PT and INR.  CMP showed sodium 124, potassium 4.2, chloride 90 and CO2 25 mmol/L with a normal anion gap.  Renal function was normal.  Glucose 147 mg/dL, total protein 6.2 and albumin 2.9 g/dL, AST 72 and ALT 77 units/L.  Normal alkaline phosphatase and total bilirubin level.  Lactic acid level was 2.3 mmol/L.   Imaging: Portable 1 view chest radiograph with no acute findings.  CTA chest with no pulmonary embolism.  There are patchy airspace opacities, especially in the lower lobes, but also in the right upper lobe and right middle lobe potentially from atypical or multilobar pneumonia.  Right high lung lymph node has increased from 0.7 to 1.3 cm (likely reactive).  Mild cardiomegaly.  Thoracic aortic and coronary artery atherosclerosis.  Bilateral airway thickening.  Thoracic spondylosis.   ED course: Initial vital signs were temperature 100.5 F, pulse 95, respiration 19, BP  119/51 mmHg and O2 sat 100% on nasal cannula 2 LPM.  The patient received LR 2500 mL liter bolus followed by LR at 150 mL/h x 20 hours, cefepime  2 g IVPB, metronidazole  500 mg IVP and vancomycin  1000 mg IVP.   Hospital Course:  #1  severe sepsis secondary to multifocal pneumonia/neutropenic fever, POA -Patient noted to have met criteria for sepsis with fever, tachycardia, hypotension, pancytopenia and lactic acidosis felt secondary to multilobar pneumonia. - Sputum cultures did grow MSSA.   - Blood cultures negative.  - MRSA PCR negative. - SARS coronavirus 2 by PCR negative. - Influenza A by PCR negative - Influenza B by PCR negative. - RSV by PCR negative. -Urine strep pneumococcus antigen negative. - Fever curve trending down during the hospitalization.   - Neutropenia improved.   - Patient improved clinically. -SLP following, patient noted to be increased risk for aspiration, patient s/p modified barium swallow during this hospitalization. - Patient initially was placed on IV vancomycin , IV cefepime , IV azithromycin .   - IV cefepime  was subsequently discontinued and patient maintained on IV vancomycin  and IV Flagyl .   - Once sputum cultures had resulted patient was subsequently transition from IV vancomycin  and Flagyl  to Augmentin  to complete a 7-day course of antibiotic treatment.   - Patient also maintained on Breztri , Flonase , Xopenex  nebs, Claritin , PPI.   - Patient improved clinically and will be discharged home on 4 more days of Augmentin  to complete a course of antibiotic treatment.  -Outpatient follow-up with PCP.    2.  Primary squamous cell carcinoma of the oropharynx, pancytopenia -Patient underwent radiation and chemotherapy last chemotherapy was 07/26/2024. - Patient maintained on filgrastim  as per oncology. - Status post transfusion 1 unit PRBCs (08/08/2024). - Counts slowly improved during the hospitalization.  - Oncology recommended continuation of radiation treatments daily and no further chemotherapy planned at this time. - Transfusion threshold platelet count <20K. - Hemoglobin < 7k. -Patient was seen by hematology/oncology during her hospitalization and will follow-up with them in the  outpatient setting.   3.  Transaminitis -With a worsening transaminitis initially on 08/09/2024.  - Likely be secondary to Diflucan . - Discontinued Diflucan . - LFTs trended down with discontinuation of Diflucan .  - Right upper quadrant ultrasound with no focal liver lesion, right small pleural effusion. -Outpatient follow-up with PCP.   4.  Hyponatremia -Improved with hydration, free water  restriction.   5.  Lactic acidosis -Resolved.   6.  Acute metabolic acidosis - Resolved.    7.  Hypophosphatemia/hypomagnesemia/hypokalemia - Repleted during the hospitalization.   - Outpatient follow-up.   8.  CAD/hyperlipidemia -Stable.  - Aspirin  held during the hospitalization secondary to thrombocytopenia and will be held on discharge until follow-up with PCP and hematology/oncology who will determine when patient's aspirin  may be resumed.   - Patient's ezetimibe  was held due to worsening LFTs. -Outpatient follow-up with PCP.   9.  Hypocalcemia - improved.   10.  Oral thrush - Patient maintained on nystatin  swish and swallow which will be discharged home on.   - Patient initially started on Diflucan  which was discontinued secondary to transaminitis.   - Outpatient follow-up with PCP.    11.  Dysphagia -Patient being followed by SLP. - Patient underwent MBS on 08/09/2024 and speech recommended dysphagia 1 diet.  -Patient tolerated dysphagia 1 diet which he will be discharged home on. -Patient also maintained on tube feeds via PEG tube during the hospitalization. -Outpatient follow-up.   12.  Severe protein calorie malnutrition -Being  followed by RD. - Patient placed on continuous tube feeds of Osmolite 1.5 at 65 cc/h which patient seems to have tolerated.. - Patient also maintained on Prosource TF 2060 mL daily. - Patient maintained on thiamine  as recommended by RD. -Patient to be set up with continuous tube feeds however per TOC continues tube feeds will not be able to be set  up until Monday, 08/14/2024. -Patient be discharged back home on bolus feeds as previously taking until he can be set up with continuous tube feeds.      Procedures: CT angiogram chest 08/07/2024 Chest x-ray 08/07/2024 Right upper quadrant ultrasound 08/11/2024  Consultations: Hematology/oncology: Dr. Autumn 08/08/2024   Discharge Exam: Vitals:   08/12/24 0807 08/12/24 1456  BP:  127/61  Pulse:  97  Resp:  20  Temp:  98.6 F (37 C)  SpO2: 95% 100%    General: NAD Cardiovascular: RRR no murmurs rubs or gallops.  No JVD.  Trace to 1+ bilateral lower extremity edema. Respiratory: Some decreased breath sounds in the bases.  Some scattered crackles.  Coarse breath sounds on the right.  No use of accessory muscles of respiration.  Speaking in full sentences.  Discharge Instructions   Discharge Instructions     Diet general   Complete by: As directed    Dysphagia 1 diet with thin liquids.   Increase activity slowly   Complete by: As directed       Allergies as of 08/12/2024   No Known Allergies      Medication List     PAUSE taking these medications    aspirin  EC 81 MG tablet Wait to take this until your doctor or other care provider tells you to start again. Take 81 mg by mouth daily. Swallow whole.   ezetimibe  10 MG tablet Wait to take this until your doctor or other care provider tells you to start again. Commonly known as: ZETIA  TAKE 1 TABLET BY MOUTH DAILY   rosuvastatin  40 MG tablet Wait to take this until your doctor or other care provider tells you to start again. Commonly known as: CRESTOR  Take 1 tablet (40 mg total) by mouth daily.       TAKE these medications    acetaminophen  325 MG tablet Commonly known as: TYLENOL  Take 2 tablets (650 mg total) by mouth every 6 (six) hours as needed for mild pain (pain score 1-3) or fever (or Fever >/= 101).   albuterol  108 (90 Base) MCG/ACT inhaler Commonly known as: VENTOLIN  HFA Inhale 1-2 puffs into  the lungs 3 (three) times daily for 5 days, THEN 1-2 puffs every 4 (four) hours as needed for wheezing or shortness of breath. Start taking on: August 12, 2024 What changed: See the new instructions.   allopurinol  300 MG tablet Commonly known as: ZYLOPRIM  Take 300 mg by mouth daily.   ALPRAZolam  1 MG tablet Commonly known as: XANAX  Take 1 mg by mouth daily as needed for anxiety or sleep.   amoxicillin -clavulanate 875-125 MG tablet Commonly known as: AUGMENTIN  Take 1 tablet by mouth every 12 (twelve) hours for 4 days.   celecoxib 200 MG capsule Commonly known as: CELEBREX   cholecalciferol  25 MCG (1000 UNIT) tablet Commonly known as: VITAMIN D3 Take 1,000 Units by mouth daily.   dexamethasone  4 MG tablet Commonly known as: DECADRON  Take 2 tablets (8 mg) by mouth daily x 3 days starting the day after cisplatin  chemotherapy. Take with food.   finasteride  5 MG tablet Commonly known as: PROSCAR  Take  5 mg by mouth daily.   fluticasone  50 MCG/ACT nasal spray Commonly known as: FLONASE  Place 2 sprays into both nostrils daily for 7 days. Start taking on: August 13, 2024   HYDROcodone -acetaminophen  5-325 MG tablet Commonly known as: NORCO/VICODIN Take 1 tablet by mouth every 6 (six) hours as needed for severe pain.   lidocaine  2 % solution Commonly known as: XYLOCAINE  Patient: Mix 1part 2% viscous lidocaine , 1part H20. Swish & swallow 10mL of diluted mixture, 30min before meals and at bedtime, up to QID   lidocaine -prilocaine  cream Commonly known as: EMLA  Apply to affected area once   loratadine  10 MG tablet Commonly known as: CLARITIN  Place 1 tablet (10 mg total) into feeding tube daily. Start taking on: August 13, 2024   magic mouthwash (nystatin , lidocaine , diphenhydrAMINE , alum & mag hydroxide) suspension Swish and swallow 5 mLs by mouth 4 (four) times daily as needed for mouth pain. Suspension contains equal amounts of Maalox Extra Strength, nystatin ,  diphenhydramine  and lidocaine . Swish and Swallow What changed: when to take this   metaxalone 800 MG tablet Commonly known as: SKELAXIN Take 400 mg by mouth 3 (three) times daily as needed for muscle spasms.   morphine  30 MG 12 hr tablet Commonly known as: MS CONTIN  Take 1 tablet (30 mg total) by mouth every 12 (twelve) hours.   nitroGLYCERIN  0.4 MG SL tablet Commonly known as: NITROSTAT  Place 1 tablet (0.4 mg total) under the tongue every 5 (five) minutes as needed for chest pain.   nystatin  100000 UNIT/ML suspension Commonly known as: MYCOSTATIN  Take 5 mLs (500,000 Units total) by mouth 4 (four) times daily for 7 days.   ondansetron  8 MG tablet Commonly known as: Zofran  Take 1 tablet (8 mg total) by mouth every 8 (eight) hours as needed for nausea or vomiting. Start on the third day after cisplatin .   Oxycodone  HCl 10 MG Tabs Take 1 tablet (10 mg total) by mouth every 6 (six) hours as needed. What changed:  how much to take when to take this   pantoprazole  40 MG tablet Commonly known as: Protonix  Take 1 tablet (40 mg total) by mouth 2 (two) times daily.   polyethylene glycol 17 g packet Commonly known as: MIRALAX  / GLYCOLAX  Take 17 g by mouth daily as needed for moderate constipation.   prochlorperazine  10 MG tablet Commonly known as: COMPAZINE  Take 1 tablet (10 mg total) by mouth every 6 (six) hours as needed (Nausea or vomiting).   REDNESS RELIEVER EYE DROPS OP Place 1 drop into both eyes daily as needed (redness).   Trelegy Ellipta 100-62.5-25 MCG/ACT Aepb Generic drug: Fluticasone -Umeclidin-Vilant Inhale 1 puff into the lungs daily.               Durable Medical Equipment  (From admission, onward)           Start     Ordered   08/12/24 1445  For home use only DME 4 wheeled rolling walker with seat  Once       Question:  Patient needs a walker to treat with the following condition  Answer:  Debility   08/12/24 1444           No Known  Allergies  Contact information for follow-up providers     Windy Coy, MD. Schedule an appointment as soon as possible for a visit in 2 week(s).   Specialty: Family Medicine Why: Follow-up in 1 to 2 weeks. Contact information: [address] [place] [address] [phone]  Pasam, Chinita, MD Follow up.   Specialty: Oncology Why: Follow-up as scheduled. Contact information: [address] [place] [address] [phone]              Contact information for after-discharge care     Home Medical Care     [facility]) .   Service: Home Health Services Contact information: [phone] [address] [place] [address] [place]  [phone]                      The results of significant diagnostics from this hospitalization (including imaging, microbiology, ancillary and laboratory) are listed below for reference.    Significant Diagnostic Studies: US  Abdomen Limited RUQ (LIVER/GB) Result Date: 08/11/2024 CLINICAL DATA:  Transaminitis EXAM: ULTRASOUND ABDOMEN LIMITED RIGHT UPPER QUADRANT COMPARISON:  PET-CT February 22, 2023. FINDINGS: Gallbladder: No gallstones or wall thickening visualized. No sonographic Murphy sign noted by sonographer. Common bile duct: Diameter: 4 mm Liver: No focal lesion identified. Normal parenchymal echogenicity. Portal vein is patent on color Doppler imaging with normal direction of blood flow towards the liver. Other: Incidental note was made of right-sided pleural effusion. IMPRESSION: No focal liver lesion. Right small pleural effusion. Electronically Signed   By: Megan  Zare M.D.   On: 08/11/2024 14:45   DG Swallowing Func-Speech Pathology Result Date: 08/09/2024 Table formatting from the original result was not included. Modified Barium Swallow Study Patient Details Name: JILES GOYA MRN: [id number] Date of Birth: Aug 22, 1951 Today's Date: 08/09/2024 HPI/PMH: HPI: pt is a 73 yo male  adm to [facility] with hypotension, poor intake.  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 He is undergoing concurrent chemoradiation. Last chemotherapy with cisplatin  cycle 5 was on 07/26/2024. Further chemotherapy was withheld because of neutropenia and has been receiving Zarxio  support in our clinic. Pt received a PEG tube for nutrition and reports having consumed po and using feeding tube for nutrition. 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  He admits odynophagia and dysphagia are equal parts responsible for his poor intake. Pt CXR 08/07/2024: Patchy airspace opacities, especially in the lower lobes, but also in the right upper lobe and right middle lobe potentially from atypical or multilobar pneumonia. Clinical Impression: Clinical Impression: Patient presents with mild oropharyngeal dysphagia likely due to his oropharyngeal mass, iatrogenic effects of XRT and his bilateral vocal fold atrophy.  These combined issues with edema result in decreased airway closure allowing trace aspiration of thin liquid with nonproductive cough response.  Aspiration noted most especially occuring early in the study - as barium spilled into open airway prior to swallow being triggered.  SLP questions some component of warm up effect. Further episodes of laryngeal penetration were inconsistent and only trace.  Mild retention noted in pharynx (more at tongue base with solids) post-swallow due to decreased tongue base retraction and inadequate mastication. Pt c/o severe xerostomia causing foods to adhere to his oral structures  No aspiration or penetration with cracker, puree or honey thick liquids.   Pharyngeal clearance with puree was excellent, likely due to  combined weight of bolus allowing improved neuro input.  Various postures were not helpful to mitigate retention or aspiration - including chin tuck *tucked approx 20*  due to neck stiffness* and head turn left.  Liquid swallows are more effective than dry swallows which are not really functional.  At this time, recommend pt be able to start po diet with strict precautions.  If pt is reflexively coughing during intake, it is likely due to aspiration.  Given he has PEG tube for nutrition, recommend close monitoring of po diet via temperatures, lung sound monitoring, etc. Expect swallow to improve several week after XRT is completed. Factors that may increase risk of adverse event in presence of aspiration Noe & Lianne 2021): Factors that may increase risk of adverse event in presence of aspiration Noe & Lianne 2021): Reduced saliva; Poor general health and/or compromised immunity Recommendations/Plan: Swallowing Evaluation Recommendations Swallowing Evaluation Recommendations Recommendations: Dysphagia 1 (Pureed);(EXTRA GRAVY/SAUCES - CREAMY PUREES!) Thin liquids (Level 0 )=  CONSIDER WATER  WITH MEALS Liquid Administration via: Cup; Straw Medication Administration: Crushed with puree (or via PEG) Swallowing strategies : Start intake with WATER ; Small bites/sips. Follow solid w/liquid. Stop intake if overtly coughing. Clear throat intermittently Treatment Plan Treatment Plan Treatment recommendations: Therapy as outlined in treatment plan below Follow-up recommendations: Follow physicians's recommendations for discharge plan and follow up therapies Functional status assessment: Patient has had a recent decline in their functional status and demonstrates the ability to make significant improvements in function in a reasonable and predictable amount of time. Treatment frequency: Min 1x/week Treatment duration: 1 week Interventions: Aspiration precaution training; Patient/family education; Oropharyngeal  exercises; Respiratory muscle strength training Recommendations Recommendations for follow up therapy are one component of a multi-disciplinary discharge planning process, led by the attending physician.  Recommendations may be updated based on patient status, additional functional criteria and insurance authorization. Assessment: Orofacial Exam: Orofacial Exam Oral Cavity: Oral Hygiene: Xerostomia; Lingual coating; Edema; Erythema Oral Cavity - Dentition: Adequate natural dentition Orofacial Anatomy: Other (comment) (pt has known left tonsillar mass close to uvula and down to glossotonsillar sulcus) Anatomy: Anatomy: Suspected cervical osteophytes Boluses Administered: Boluses Administered Boluses Administered: Thin liquids (Level 0); Mildly thick liquids (Level 2, nectar thick); Puree; Moderately thick liquids (Level 3, honey thick); Solid  Oral Impairment Domain: Oral Impairment Domain Lip Closure: No labial escape Tongue control during bolus hold: Cohesive bolus between tongue to palatal seal Bolus preparation/mastication: Minimal chewing/mashing with majority of bolus unchewed Bolus transport/lingual motion: Slow tongue motion Oral residue: Trace residue lining oral structures Location of oral residue : Palate; Tongue Initiation of pharyngeal swallow : Posterior laryngeal surface of the epiglottis; Pyriform sinuses; Valleculae  Pharyngeal Impairment Domain: Pharyngeal Impairment Domain Soft palate elevation: No bolus between soft palate (SP)/pharyngeal wall (PW) Laryngeal elevation: Partial superior movement of thyroid  cartilage/partial approximation of arytenoids to epiglottic petiole Anterior hyoid excursion: Partial anterior movement Epiglottic movement: Partial inversion Laryngeal vestibule closure: Incomplete, narrow column air/contrast in laryngeal vestibule Pharyngeal stripping wave : Present - diminished Pharyngeal contraction (A/P view only): N/A Pharyngoesophageal segment opening: Partial  distention/partial duration, partial obstruction of flow Tongue base retraction: Narrow column of contrast or air between tongue base and PPW Pharyngeal residue: Collection of residue within or on pharyngeal structures; Trace residue within or on pharyngeal structures Location of pharyngeal residue: Tongue base; Valleculae (tongue base and vallecular more than pharyngeal wall and pyriform sinus)  Esophageal Impairment Domain: Esophageal Impairment Domain Esophageal clearance upright position: Esophageal retention Pill: Pill Consistency administered: -- (DNT) Penetration/Aspiration Scale Score: Penetration/Aspiration Scale Score 1.  Material does not enter airway: Moderately thick liquids (Level 3, honey thick); Puree; Solid 2.  Material enters airway, remains  ABOVE vocal cords then ejected out: Mildly thick liquids (Level 2, nectar thick) 7.  Material enters airway, passes BELOW cords and not ejected out despite cough attempt by patient: Thin liquids (Level 0) Compensatory Strategies: Compensatory Strategies Compensatory strategies: Yes Chin tuck: Ineffective Ineffective Chin Tuck: Thin liquid (Level 0) Liquid wash: Effective Left head turn: Ineffective Ineffective Left Head Turn: Mildly thick liquid (Level 2, nectar thick)   General Information: Caregiver present: Yes  Diet Prior to this Study: NPO; Other (Comment) (ice chips)   Temperature : Normal   Respiratory Status: WFL   Supplemental O2: None (Room air)   History of Recent Intubation: No  Behavior/Cognition: Alert; Cooperative; Pleasant mood Self-Feeding Abilities: Able to self-feed Baseline vocal quality/speech: Dysphonic Volitional Cough: Able to elicit Volitional Swallow: Able to elicit Exam Limitations: No limitations Goal Planning: Prognosis for improved oropharyngeal function: Good No data recorded No data recorded Patient/Family Stated Goal: He admits odynophagia and dysphagia are equal parts responsible for his poor intake. No data recorded Pain: Pain  Assessment Pain Assessment: No/denies pain Faces Pain Scale: 2 Pain Location: oropharynx Pain Descriptors / Indicators: Aching; Burning Pain Intervention(s): Limited activity within patient's tolerance; Other (comment) (RN provided medication during session) End of Session: Start Time:SLP Start Time (ACUTE ONLY): 1210 Stop Time: SLP Stop Time (ACUTE ONLY): 1235 Time Calculation:SLP Time Calculation (min) (ACUTE ONLY): 25 min Charges: SLP Evaluations $ SLP Speech Visit: 1 Visit SLP Evaluations $BSS Swallow: 1 Procedure $MBS Swallow: 1 Procedure $Swallowing Treatment: 1 Procedure SLP visit diagnosis: SLP Visit Diagnosis: Dysphagia, oropharyngeal phase (R13.12) Past Medical History: Past Medical History: Diagnosis Date  Asthma   Cancer (HCC)   prostate cancer  Coronary artery disease   pt states they were unable to place a stent at that time  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, follwed by [facility]  Dyspnea   with exertion  GERD (gastroesophageal reflux disease)   Hyperlipidemia   Hypertension   Pneumonia  Past Surgical History: Past Surgical History: Procedure Laterality Date  CATARACT EXTRACTION Bilateral   IR GASTROSTOMY TUBE MOD SED  06/23/2024  IR IMAGING GUIDED PORT INSERTION  06/23/2024  LEFT HEART CATH AND CORONARY ANGIOGRAPHY N/A 06/05/2022  Procedure: LEFT HEART CATH AND CORONARY ANGIOGRAPHY;  Surgeon: Claudene Victory ORN, MD;  Location: [facility];  Service: Cardiovascular;  Laterality: N/A;  LYMPHADENECTOMY Bilateral 04/29/2023  Procedure: BILATERAL PELVIC LYMPHADENECTOMY;  Surgeon: Renda Glance, MD;  Location: [facility];  Service: Urology;  Laterality: Bilateral;  PROSTATE BIOPSY    ROBOT ASSISTED LAPAROSCOPIC RADICAL PROSTATECTOMY N/A 04/29/2023  Procedure: XI ROBOTIC ASSISTED LAPAROSCOPIC RADICAL PROSTATECTOMY LEVEL 2;  Surgeon: Renda Glance, MD;  Location: [facility];  Service: Urology;  Laterality: N/A;  210 MINUTES NEEDED FOR CASE  UPPER GASTROINTESTINAL ENDOSCOPY    VENTRAL HERNIA REPAIR N/A  10/05/2023  Procedure: LAPAROSCOPIC VENTRAL HERNIA REPAAIR WITH MESH;  Surgeon: Rubin Calamity, MD;  Location: [facility] OR;  Service: General;  Laterality: N/A; [name], MS [facility] SLP Acute Rehab Services Office [phone] [name] 08/09/2024, 2:55 PM  CT Angio Chest PE W/Cm &/Or Wo Cm Result Date: 08/07/2024 EXAM: CTA CHEST 08/07/2024 01:30:55 PM TECHNIQUE: CTA of the chest was performed after the administration of intravenous contrast. Multiplanar reformatted images are provided for review. MIP images are provided for review. Automated exposure control, iterative reconstruction, and/or weight based adjustment of the mA/kV was utilized to reduce the radiation dose to as low as reasonably achievable. COMPARISON: None available. 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 * Tracking Code: BO * right port a cath tip colon SVC. FINDINGS: PULMONARY ARTERIES: Pulmonary arteries are adequately opacified for evaluation. No acute pulmonary embolus. Main pulmonary artery is normal in caliber. MEDIASTINUM: Mild cardiomegaly. Thoracic aortic, coronary artery, and branch vessel atheromatous vascular calcifications. There is no acute abnormality of the thoracic aorta. LYMPH NODES: Right high lung node 1.3 cm in short axis on image 62 series 5, formerly 0.7 cm. No mediastinal or axillary lymphadenopathy. LUNGS AND PLEURA: Bilateral airway thickening noted. Patchy and nodular regions of airspace opacity in the right upper lobe, right middle lobe, and confluent in both lower lobes especially dependently raising the possibility of multilobar pneumonia. There is also some faint ground glass opacity posteriorly in the lingula. No evidence of pleural effusion or pneumothorax. UPPER ABDOMEN: Limited images of the upper abdomen are unremarkable. Peg tube noted. SOFT TISSUES AND BONES: Thoracic spondylosis. No  acute bone or soft tissue abnormality. IMPRESSION: 1. No pulmonary embolism. 2. Patchy airspace opacities, especially in the lower lobes, but also in the right upper lobe and right middle lobe potentially from atypical or multilobar pneumonia. 3. Enlarged right high lung lymph node measuring 1.3 cm short axis, increased from 0.7 cm. This is likely reactive given the findings in the lung, although surveillance is suggested. 4. Mild cardiomegaly. 5. Thoracic aortic and coronary artery atherosclerosis. 6. Bilateral airway thickening. 7. Thoracic spondylosis. Electronically signed by: Ryan Salvage MD 08/07/2024 02:26 PM EST RP Workstation: [id number]   DG Chest Port 1 View Result Date: 08/07/2024 CLINICAL DATA:  Questionable sepsis. Head and neck cancer, hypotension with decreased O2 sats. EXAM: PORTABLE CHEST 1 VIEW COMPARISON:  09/28/2022.  CT chest 04/06/2024. FINDINGS: Right IJ Port-A-Cath tip is at the SVC RA junction. Lungs are clear. No pleural fluid. IMPRESSION: No acute findings. Electronically Signed   By: Newell Eke M.D.   On: 08/07/2024 10:59    Microbiology: Recent Results (from the past 240 hours)  Blood Culture (routine x 2)     Status: None   Collection Time: 08/07/24 10:36 AM   Specimen: BLOOD  Result Value Ref Range Status   Specimen Description   Final    BLOOD RIGHT ANTECUBITAL Performed at [facility], [address] [place], [place] [address]    Special Requests   Final    BOTTLES DRAWN AEROBIC AND ANAEROBIC Blood Culture results may not be optimal due to an inadequate volume of blood received in culture bottles Performed at [facility], [address] [place], [place] [address]    Culture   Final    NO GROWTH 5 DAYS Performed at [facility], [address], [place], [place] [address]    Report Status 08/12/2024 FINAL  Final  Blood Culture (routine x 2)     Status: None   Collection Time: 08/07/24 10:36 AM    Specimen: BLOOD  Result Value Ref Range Status   Specimen Description   Final    BLOOD BLOOD RIGHT FOREARM Performed at [facility], [address], [place], [place] [address]    Special Requests   Final    BOTTLES DRAWN AEROBIC ONLY Blood Culture results may not be optimal due to an inadequate volume of blood received in culture bottles Performed at [facility], [address], [place], [place] [address]    Culture   Final  NO GROWTH 5 DAYS Performed at [facility], [address], [place], [place] [address]    Report Status 08/12/2024 FINAL  Final  Resp panel by RT-PCR (RSV, Flu A&B, Covid) Anterior Nasal Swab     Status: None   Collection Time: 08/07/24 11:40 AM   Specimen: Anterior Nasal Swab  Result Value Ref Range Status   SARS Coronavirus 2 by RT PCR NEGATIVE NEGATIVE Final    Comment: (NOTE) SARS-CoV-2 target nucleic acids are NOT DETECTED.  The SARS-CoV-2 RNA is generally detectable in upper respiratory specimens during the acute phase of infection. The lowest concentration of SARS-CoV-2 viral copies this assay can detect is 138 copies/mL. A negative result does not preclude SARS-Cov-2 infection and should not be used as the sole basis for treatment or other patient management decisions. A negative result may occur with  improper specimen collection/handling, submission of specimen other than nasopharyngeal swab, presence of viral mutation(s) within the areas targeted by this assay, and inadequate number of viral copies(<138 copies/mL). A negative result must be combined with clinical observations, patient history, and epidemiological information. The expected result is Negative.  Fact Sheet for Patients:  bloggercourse.com  Fact Sheet for Healthcare Providers:  seriousbroker.it  This test is no t yet approved or cleared by the [place]  FDA and  has been  authorized for detection and/or diagnosis of SARS-CoV-2 by FDA under an Emergency Use Authorization (EUA). This EUA will remain  in effect (meaning this test can be used) for the duration of the COVID-19 declaration under Section 564(b)(1) of the Act, 21 U.S.C.section 360bbb-3(b)(1), unless the authorization is terminated  or revoked sooner.       Influenza A by PCR NEGATIVE NEGATIVE Final   Influenza B by PCR NEGATIVE NEGATIVE Final    Comment: (NOTE) The Xpert Xpress SARS-CoV-2/FLU/RSV plus assay is intended as an aid in the diagnosis of influenza from Nasopharyngeal swab specimens and should not be used as a sole basis for treatment. Nasal washings and aspirates are unacceptable for Xpert Xpress SARS-CoV-2/FLU/RSV testing.  Fact Sheet for Patients: bloggercourse.com  Fact Sheet for Healthcare Providers: seriousbroker.it  This test is not yet approved or cleared by the [place]  FDA and has been authorized for detection and/or diagnosis of SARS-CoV-2 by FDA under an Emergency Use Authorization (EUA). This EUA will remain in effect (meaning this test can be used) for the duration of the COVID-19 declaration under Section 564(b)(1) of the Act, 21 U.S.C. section 360bbb-3(b)(1), unless the authorization is terminated or revoked.     Resp Syncytial Virus by PCR NEGATIVE NEGATIVE Final    Comment: (NOTE) Fact Sheet for Patients: bloggercourse.com  Fact Sheet for Healthcare Providers: seriousbroker.it  This test is not yet approved or cleared by the [place]  FDA and has been authorized for detection and/or diagnosis of SARS-CoV-2 by FDA under an Emergency Use Authorization (EUA). This EUA will remain in effect (meaning this test can be used) for the duration of the COVID-19 declaration under Section 564(b)(1) of the Act, 21 U.S.C. section 360bbb-3(b)(1), unless the  authorization is terminated or revoked.  Performed at [facility], [address] [place], [place] [address]   MRSA Next Gen by PCR, Nasal     Status: None   Collection Time: 08/07/24  8:31 PM   Specimen: Nasal Mucosa; Nasal Swab  Result Value Ref Range Status   MRSA by PCR Next Gen NOT DETECTED NOT DETECTED Final    Comment: (NOTE) The GeneXpert MRSA  Assay (FDA approved for NASAL specimens only), is one component of a comprehensive MRSA colonization surveillance program. It is not intended to diagnose MRSA infection nor to guide or monitor treatment for MRSA infections. Test performance is not FDA approved in patients less than 39 years old. Performed at [facility], [address], [place], [place] [address]   Expectorated Sputum Assessment w Gram Stain, Rflx to Resp Cult     Status: None   Collection Time: 08/08/24  2:25 AM   Specimen: Expectorated Sputum  Result Value Ref Range Status   Specimen Description EXPECTORATED SPUTUM  Final   Special Requests NONE  Final   Sputum evaluation   Final    Sputum specimen not acceptable for testing.  Please recollect.   NOTIFIED MATHEW HERO RN OF RECOLLECT @ [phone] ON 08/08/2024 BY MTA Performed at [facility], [address], [place], [place] [address]    Report Status 08/08/2024 FINAL  Final  Expectorated Sputum Assessment w Gram Stain, Rflx to Resp Cult     Status: None   Collection Time: 08/08/24  9:15 AM  Result Value Ref Range Status   Specimen Description EXPECTORATED SPUTUM  Final   Special Requests NONE  Final   Sputum evaluation   Final    THIS SPECIMEN IS ACCEPTABLE FOR SPUTUM CULTURE Performed at [facility], [address] [place], [place] [address]    Report Status 08/08/2024 FINAL  Final  Culture, Respiratory w Gram Stain     Status: None   Collection Time: 08/08/24  9:15 AM  Result Value Ref Range Status   Specimen Description   Final     EXPECTORATED SPUTUM Performed at [facility], [address], [place], [place] [address]    Special Requests   Final    NONE Reflexed from [phone] Performed at [facility], [address], [place], [place] [address]    Gram Stain   Final    ABUNDANT WBC PRESENT, PREDOMINANTLY PMN MODERATE GRAM POSITIVE RODS    Culture   Final    FEW STAPHYLOCOCCUS AUREUS WITHIN MIXED FLORA Performed at [facility], [address], [place], [place] [address]    Report Status 08/11/2024 FINAL  Final   Organism ID, Bacteria STAPHYLOCOCCUS AUREUS  Final      Susceptibility   Staphylococcus aureus - MIC*    CIPROFLOXACIN <=0.5 SENSITIVE Sensitive     ERYTHROMYCIN <=0.25 SENSITIVE Sensitive     GENTAMICIN <=0.5 SENSITIVE Sensitive     OXACILLIN 0.5 SENSITIVE Sensitive     TETRACYCLINE <=1 SENSITIVE Sensitive     VANCOMYCIN  1 SENSITIVE Sensitive     TRIMETH /SULFA  <=10 SENSITIVE Sensitive     CLINDAMYCIN <=0.25 SENSITIVE Sensitive     RIFAMPIN <=0.5 SENSITIVE Sensitive     Inducible Clindamycin NEGATIVE Sensitive     LINEZOLID 2 SENSITIVE Sensitive     * FEW STAPHYLOCOCCUS AUREUS     Labs: Basic Metabolic Panel: Recent Labs  Lab 08/08/24 0405 08/08/24 1744 08/09/24 0355 08/09/24 1731 08/10/24 0553 08/11/24 0600 08/12/24 0444  NA 131*  --  135  --  135 133* 132*  K 4.1  --  3.5  --  3.4* 3.5 3.3*  CL 98  --  100  --  102 100 96*  CO2 24  --  21*  --  24 25 25   GLUCOSE 190*  --  111*  --  132* 96 115*  BUN 15  --  22  --  21 16 18   CREATININE 0.93  --  1.09  --  0.93 0.83 0.84  CALCIUM  8.4*  --  9.2  --  8.6* 8.2* 8.4*  MG 2.2 1.9 2.0 1.7 1.7 1.7 1.8  PHOS 4.2 3.0 2.4* 1.8* 2.5  --   --    Liver Function Tests: Recent Labs  Lab 08/08/24 0405 08/09/24 0355 08/10/24 0553 08/11/24 0930 08/12/24 0444  AST 65* 127* 114* 67* 72*  ALT 68* 103* 116* 90* 88*  ALKPHOS 51 57 69 67 84  BILITOT 0.5 0.5 0.4 0.5 0.4  PROT 5.5* 6.1* 5.9* 5.8*  6.3*  ALBUMIN 2.4* 2.6* 2.5* 2.4* 2.6*   No results for input(s): LIPASE, AMYLASE in the last 168 hours. No results for input(s): AMMONIA in the last 168 hours. CBC: Recent Labs  Lab 08/08/24 0405 08/08/24 0857 08/09/24 0355 08/10/24 0553 08/11/24 0600 08/12/24 0444  WBC 0.8*  --  1.5* 1.6* 2.6* 3.3*  NEUTROABS 0.5*  --  1.1* 1.1* 1.8 2.3  HGB 7.7* 8.5* 8.9* 8.3* 7.8* 8.4*  HCT 22.0* 23.2* 25.3* 24.1* 22.3* 24.5*  MCV 86.6  --  84.9 88.0 86.4 87.5  PLT 39*  --  58* 62* 69* 96*   Cardiac Enzymes: No results for input(s): CKTOTAL, CKMB, CKMBINDEX, TROPONINI in the last 168 hours. BNP: BNP (last 3 results) No results for input(s): BNP in the last 8760 hours.  ProBNP (last 3 results) No results for input(s): PROBNP in the last 8760 hours.  CBG: Recent Labs  Lab 08/11/24 1938 08/12/24 0018 08/12/24 0413 08/12/24 0800 08/12/24 1317  GLUCAP 112* 133* 131* 134* 130*       Signed:  Toribio Hummer MD.  [facility] 08/12/2024, 3:23 PM

## 2024-08-14 ENCOUNTER — Other Ambulatory Visit: Payer: Self-pay | Admitting: Oncology

## 2024-08-14 ENCOUNTER — Encounter (HOSPITAL_COMMUNITY): Payer: Self-pay | Admitting: *Deleted

## 2024-08-14 ENCOUNTER — Emergency Department (HOSPITAL_COMMUNITY)

## 2024-08-14 ENCOUNTER — Encounter: Payer: Self-pay | Admitting: Radiation Oncology

## 2024-08-14 ENCOUNTER — Inpatient Hospital Stay (HOSPITAL_COMMUNITY)
Admission: EM | Admit: 2024-08-14 | Discharge: 2024-08-16 | DRG: 393 | Disposition: A | Discharge status: Home Health | Source: Ambulatory Visit | Attending: Internal Medicine | Admitting: Internal Medicine

## 2024-08-14 ENCOUNTER — Telehealth: Payer: Self-pay | Admitting: Oncology

## 2024-08-14 ENCOUNTER — Other Ambulatory Visit: Payer: Self-pay

## 2024-08-14 ENCOUNTER — Ambulatory Visit

## 2024-08-14 ENCOUNTER — Telehealth: Payer: Self-pay

## 2024-08-14 ENCOUNTER — Emergency Department (EMERGENCY_DEPARTMENT_HOSPITAL)

## 2024-08-14 ENCOUNTER — Ambulatory Visit
Admission: RE | Admit: 2024-08-14 | Discharge: 2024-08-14 | Disposition: A | Source: Ambulatory Visit | Attending: Radiation Oncology

## 2024-08-14 DIAGNOSIS — I10 Essential (primary) hypertension: Secondary | ICD-10-CM | POA: Diagnosis not present

## 2024-08-14 DIAGNOSIS — Z803 Family history of malignant neoplasm of breast: Secondary | ICD-10-CM | POA: Diagnosis not present

## 2024-08-14 DIAGNOSIS — Y95 Nosocomial condition: Secondary | ICD-10-CM | POA: Diagnosis present

## 2024-08-14 DIAGNOSIS — L309 Dermatitis, unspecified: Secondary | ICD-10-CM | POA: Diagnosis present

## 2024-08-14 DIAGNOSIS — K521 Toxic gastroenteritis and colitis: Secondary | ICD-10-CM | POA: Diagnosis present

## 2024-08-14 DIAGNOSIS — R197 Diarrhea, unspecified: Principal | ICD-10-CM | POA: Diagnosis present

## 2024-08-14 DIAGNOSIS — R7401 Elevation of levels of liver transaminase levels: Secondary | ICD-10-CM | POA: Diagnosis present

## 2024-08-14 DIAGNOSIS — K1230 Oral mucositis (ulcerative), unspecified: Secondary | ICD-10-CM | POA: Diagnosis present

## 2024-08-14 DIAGNOSIS — Z8249 Family history of ischemic heart disease and other diseases of the circulatory system: Secondary | ICD-10-CM

## 2024-08-14 DIAGNOSIS — E785 Hyperlipidemia, unspecified: Secondary | ICD-10-CM | POA: Diagnosis not present

## 2024-08-14 DIAGNOSIS — E860 Dehydration: Secondary | ICD-10-CM | POA: Diagnosis present

## 2024-08-14 DIAGNOSIS — Z931 Gastrostomy status: Secondary | ICD-10-CM

## 2024-08-14 DIAGNOSIS — E876 Hypokalemia: Secondary | ICD-10-CM | POA: Diagnosis present

## 2024-08-14 DIAGNOSIS — D649 Anemia, unspecified: Secondary | ICD-10-CM | POA: Diagnosis not present

## 2024-08-14 DIAGNOSIS — Z806 Family history of leukemia: Secondary | ICD-10-CM

## 2024-08-14 DIAGNOSIS — E440 Moderate protein-calorie malnutrition: Secondary | ICD-10-CM | POA: Diagnosis present

## 2024-08-14 DIAGNOSIS — C090 Malignant neoplasm of tonsillar fossa: Secondary | ICD-10-CM | POA: Diagnosis not present

## 2024-08-14 DIAGNOSIS — T451X5A Adverse effect of antineoplastic and immunosuppressive drugs, initial encounter: Secondary | ICD-10-CM | POA: Diagnosis not present

## 2024-08-14 DIAGNOSIS — Z6826 Body mass index (BMI) 26.0-26.9, adult: Secondary | ICD-10-CM | POA: Diagnosis not present

## 2024-08-14 DIAGNOSIS — I2510 Atherosclerotic heart disease of native coronary artery without angina pectoris: Secondary | ICD-10-CM | POA: Diagnosis not present

## 2024-08-14 DIAGNOSIS — Y842 Radiological procedure and radiotherapy as the cause of abnormal reaction of the patient, or of later complication, without mention of misadventure at the time of the procedure: Secondary | ICD-10-CM | POA: Diagnosis not present

## 2024-08-14 DIAGNOSIS — C760 Malignant neoplasm of head, face and neck: Secondary | ICD-10-CM | POA: Diagnosis not present

## 2024-08-14 DIAGNOSIS — J45909 Unspecified asthma, uncomplicated: Secondary | ICD-10-CM | POA: Diagnosis present

## 2024-08-14 DIAGNOSIS — Z7982 Long term (current) use of aspirin: Secondary | ICD-10-CM | POA: Diagnosis not present

## 2024-08-14 DIAGNOSIS — D638 Anemia in other chronic diseases classified elsewhere: Secondary | ICD-10-CM | POA: Diagnosis present

## 2024-08-14 DIAGNOSIS — Z8546 Personal history of malignant neoplasm of prostate: Secondary | ICD-10-CM

## 2024-08-14 DIAGNOSIS — Z7951 Long term (current) use of inhaled steroids: Secondary | ICD-10-CM | POA: Diagnosis not present

## 2024-08-14 DIAGNOSIS — Z79899 Other long term (current) drug therapy: Secondary | ICD-10-CM | POA: Diagnosis not present

## 2024-08-14 DIAGNOSIS — C109 Malignant neoplasm of oropharynx, unspecified: Secondary | ICD-10-CM | POA: Diagnosis present

## 2024-08-14 DIAGNOSIS — Z9221 Personal history of antineoplastic chemotherapy: Secondary | ICD-10-CM

## 2024-08-14 DIAGNOSIS — D61810 Antineoplastic chemotherapy induced pancytopenia: Secondary | ICD-10-CM | POA: Diagnosis present

## 2024-08-14 DIAGNOSIS — R0602 Shortness of breath: Secondary | ICD-10-CM | POA: Diagnosis not present

## 2024-08-14 DIAGNOSIS — Z9079 Acquired absence of other genital organ(s): Secondary | ICD-10-CM

## 2024-08-14 DIAGNOSIS — Z510 Encounter for antineoplastic radiation therapy: Secondary | ICD-10-CM | POA: Diagnosis not present

## 2024-08-14 DIAGNOSIS — R600 Localized edema: Secondary | ICD-10-CM

## 2024-08-14 DIAGNOSIS — Z800 Family history of malignant neoplasm of digestive organs: Secondary | ICD-10-CM

## 2024-08-14 DIAGNOSIS — Z8042 Family history of malignant neoplasm of prostate: Secondary | ICD-10-CM | POA: Diagnosis not present

## 2024-08-14 DIAGNOSIS — R918 Other nonspecific abnormal finding of lung field: Secondary | ICD-10-CM | POA: Diagnosis not present

## 2024-08-14 DIAGNOSIS — M7989 Other specified soft tissue disorders: Secondary | ICD-10-CM | POA: Diagnosis not present

## 2024-08-14 DIAGNOSIS — I5031 Acute diastolic (congestive) heart failure: Secondary | ICD-10-CM | POA: Diagnosis not present

## 2024-08-14 DIAGNOSIS — R1310 Dysphagia, unspecified: Secondary | ICD-10-CM | POA: Diagnosis not present

## 2024-08-14 DIAGNOSIS — J189 Pneumonia, unspecified organism: Secondary | ICD-10-CM | POA: Diagnosis not present

## 2024-08-14 LAB — URINALYSIS, ROUTINE W REFLEX MICROSCOPIC
Bacteria, UA: NONE SEEN
Bilirubin Urine: NEGATIVE
Glucose, UA: NEGATIVE mg/dL
Hgb urine dipstick: NEGATIVE
Ketones, ur: NEGATIVE mg/dL
Leukocytes,Ua: NEGATIVE
Nitrite: NEGATIVE
Protein, ur: 100 mg/dL — AB
Specific Gravity, Urine: 1.012 (ref 1.005–1.030)
pH: 8 (ref 5.0–8.0)

## 2024-08-14 LAB — CBC WITH DIFFERENTIAL/PLATELET
Abs Immature Granulocytes: 0.27 K/uL — ABNORMAL HIGH (ref 0.00–0.07)
Basophils Absolute: 0 K/uL (ref 0.0–0.1)
Basophils Relative: 1 %
Eosinophils Absolute: 0 K/uL (ref 0.0–0.5)
Eosinophils Relative: 0 %
HCT: 28.3 % — ABNORMAL LOW (ref 39.0–52.0)
Hemoglobin: 9.6 g/dL — ABNORMAL LOW (ref 13.0–17.0)
Immature Granulocytes: 5 %
Lymphocytes Relative: 12 %
Lymphs Abs: 0.6 K/uL — ABNORMAL LOW (ref 0.7–4.0)
MCH: 30 pg (ref 26.0–34.0)
MCHC: 33.9 g/dL (ref 30.0–36.0)
MCV: 88.4 fL (ref 80.0–100.0)
Monocytes Absolute: 0.2 K/uL (ref 0.1–1.0)
Monocytes Relative: 5 %
Neutro Abs: 4 K/uL (ref 1.7–7.7)
Neutrophils Relative %: 77 %
Platelets: 133 K/uL — ABNORMAL LOW (ref 150–400)
RBC: 3.2 MIL/uL — ABNORMAL LOW (ref 4.22–5.81)
RDW: 17.1 % — ABNORMAL HIGH (ref 11.5–15.5)
Smear Review: NORMAL
WBC: 5.1 K/uL (ref 4.0–10.5)
nRBC: 0 % (ref 0.0–0.2)

## 2024-08-14 LAB — COMPREHENSIVE METABOLIC PANEL WITH GFR
ALT: 69 U/L — ABNORMAL HIGH (ref 0–44)
AST: 61 U/L — ABNORMAL HIGH (ref 15–41)
Albumin: 2.6 g/dL — ABNORMAL LOW (ref 3.5–5.0)
Alkaline Phosphatase: 96 U/L (ref 38–126)
Anion gap: 10 (ref 5–15)
BUN: 18 mg/dL (ref 8–23)
CO2: 26 mmol/L (ref 22–32)
Calcium: 9 mg/dL (ref 8.9–10.3)
Chloride: 96 mmol/L — ABNORMAL LOW (ref 98–111)
Creatinine, Ser: 0.95 mg/dL (ref 0.61–1.24)
GFR, Estimated: >60 mL/min (ref >60)
Glucose, Bld: 112 mg/dL — ABNORMAL HIGH (ref 70–99)
Potassium: 3.7 mmol/L (ref 3.5–5.1)
Sodium: 132 mmol/L — ABNORMAL LOW (ref 135–145)
Total Bilirubin: 0.4 mg/dL (ref 0.0–1.2)
Total Protein: 7.2 g/dL (ref 6.5–8.1)

## 2024-08-14 LAB — RAD ONC ARIA SESSION SUMMARY
Course Elapsed Days: 48
Plan Fractions Treated to Date: 34
Plan Prescribed Dose Per Fraction: 2 Gy
Plan Total Fractions Prescribed: 35
Plan Total Prescribed Dose: 70 Gy
Reference Point Dosage Given to Date: 68 Gy
Reference Point Session Dosage Given: 2 Gy
Session Number: 34

## 2024-08-14 LAB — TROPONIN T, HIGH SENSITIVITY
Troponin T High Sensitivity: 34 ng/L — ABNORMAL HIGH (ref 0–19)
Troponin T High Sensitivity: 31 ng/L — ABNORMAL HIGH (ref 0–19)

## 2024-08-14 LAB — PRO BRAIN NATRIURETIC PEPTIDE: Pro Brain Natriuretic Peptide: 677 pg/mL — ABNORMAL HIGH (ref <300.0)

## 2024-08-14 LAB — LIPASE, BLOOD: Lipase: 11 U/L (ref 11–51)

## 2024-08-14 LAB — MAGNESIUM: Magnesium: 1.5 mg/dL — ABNORMAL LOW (ref 1.7–2.4)

## 2024-08-14 MED ORDER — FENTANYL CITRATE (PF) 50 MCG/ML IJ SOSY
50.0000 ug | PREFILLED_SYRINGE | Freq: Once | INTRAMUSCULAR | Status: AC
Start: 2024-08-14 — End: 2024-08-14
  Administered 2024-08-14: 50 ug via INTRAVENOUS
  Filled 2024-08-14: qty 1

## 2024-08-14 MED ORDER — LOPERAMIDE HCL 2 MG PO CAPS
2.0000 mg | ORAL_CAPSULE | ORAL | Status: DC | PRN
Start: 2024-08-14 — End: 2024-08-16

## 2024-08-14 MED ORDER — PANTOPRAZOLE SODIUM 40 MG IV SOLR
40.0000 mg | Freq: Two times a day (BID) | INTRAVENOUS | Status: DC
  Administered 2024-08-14 – 2024-08-16 (×4): 40 mg via INTRAVENOUS
  Filled 2024-08-14 (×4): qty 10

## 2024-08-14 MED ORDER — ENOXAPARIN SODIUM 40 MG/0.4ML IJ SOSY
40.0000 mg | PREFILLED_SYRINGE | INTRAMUSCULAR | Status: DC
  Administered 2024-08-14 – 2024-08-15 (×2): 40 mg via SUBCUTANEOUS
  Filled 2024-08-14 (×2): qty 0.4

## 2024-08-14 MED ORDER — ONDANSETRON HCL 4 MG/2ML IJ SOLN: 4.0000 mg | Freq: Four times a day (QID) | INTRAMUSCULAR | Status: DC | PRN

## 2024-08-14 MED ORDER — MAGNESIUM SULFATE 2 GM/50ML IV SOLN
2.0000 g | Freq: Once | INTRAVENOUS | Status: AC
  Administered 2024-08-14: 2 g via INTRAVENOUS
  Filled 2024-08-14: qty 50

## 2024-08-14 MED ORDER — OSMOLITE 1.5 CAL PO LIQD
1000.0000 mL | ORAL | Status: DC
  Administered 2024-08-14 – 2024-08-16 (×2): 1000 mL
  Filled 2024-08-14 (×4): qty 1000

## 2024-08-14 MED ORDER — ACETAMINOPHEN 650 MG RE SUPP: 650.0000 mg | Freq: Four times a day (QID) | RECTAL | Status: DC | PRN

## 2024-08-14 MED ORDER — SODIUM CHLORIDE 0.9 % IV BOLUS: 1000.0000 mL | Freq: Once | INTRAVENOUS | Status: DC

## 2024-08-14 MED ORDER — SODIUM CHLORIDE 0.9% FLUSH
10.0000 mL | Freq: Two times a day (BID) | INTRAVENOUS | Status: DC
  Administered 2024-08-15 (×3): 20 mL
  Administered 2024-08-16: 10 mL

## 2024-08-14 MED ORDER — NYSTATIN 100000 UNIT/ML MT SUSP
5.0000 mL | Freq: Four times a day (QID) | OROMUCOSAL | Status: DC | PRN
Start: 2024-08-14 — End: 2024-08-14

## 2024-08-14 MED ORDER — LEVALBUTEROL HCL 0.63 MG/3ML IN NEBU
0.6300 mg | INHALATION_SOLUTION | Freq: Four times a day (QID) | RESPIRATORY_TRACT | Status: DC | PRN
  Administered 2024-08-15: 0.63 mg via RESPIRATORY_TRACT
  Filled 2024-08-14 (×2): qty 3

## 2024-08-14 MED ORDER — SODIUM CHLORIDE 0.9 % IV SOLN
2.0000 g | Freq: Three times a day (TID) | INTRAVENOUS | Status: DC
  Administered 2024-08-15 – 2024-08-16 (×5): 2 g via INTRAVENOUS
  Filled 2024-08-14 (×5): qty 12.5

## 2024-08-14 MED ORDER — MAGIC MOUTHWASH W/LIDOCAINE
5.0000 mL | Freq: Four times a day (QID) | ORAL | Status: DC | PRN
  Administered 2024-08-15 (×2): 5 mL via ORAL
  Filled 2024-08-14 (×5): qty 5

## 2024-08-14 MED ORDER — SODIUM CHLORIDE 0.9% FLUSH: 10.0000 mL | INTRAVENOUS | Status: DC | PRN

## 2024-08-14 MED ORDER — HYDROMORPHONE HCL 1 MG/ML IJ SOLN
1.0000 mg | INTRAMUSCULAR | Status: DC | PRN
  Administered 2024-08-14 – 2024-08-16 (×10): 1 mg via INTRAVENOUS
  Filled 2024-08-14 (×10): qty 1

## 2024-08-14 MED ORDER — CHLORHEXIDINE GLUCONATE CLOTH 2 % EX PADS
6.0000 | MEDICATED_PAD | Freq: Every day | CUTANEOUS | Status: DC
  Administered 2024-08-15 – 2024-08-16 (×2): 6 via TOPICAL

## 2024-08-14 MED ORDER — OXYCODONE HCL 5 MG PO TABS: 10.0000 mg | ORAL_TABLET | Freq: Four times a day (QID) | ORAL | Status: DC | PRN

## 2024-08-14 MED ORDER — FINASTERIDE 5 MG PO TABS
5.0000 mg | ORAL_TABLET | Freq: Every day | ORAL | Status: DC
  Administered 2024-08-14 – 2024-08-16 (×3): 5 mg via ORAL
  Filled 2024-08-14 (×3): qty 1

## 2024-08-14 MED ORDER — LORATADINE 10 MG PO TABS: 10.0000 mg | ORAL_TABLET | Freq: Every day | ORAL | Status: DC

## 2024-08-14 MED ORDER — HYDROMORPHONE HCL 1 MG/ML IJ SOLN
1.0000 mg | INTRAMUSCULAR | Status: DC | PRN
  Administered 2024-08-14: 1 mg via INTRAVENOUS
  Filled 2024-08-14: qty 1

## 2024-08-14 MED ORDER — NYSTATIN 100000 UNIT/ML MT SUSP: 5.0000 mL | Freq: Four times a day (QID) | OROMUCOSAL | Status: DC

## 2024-08-14 MED ORDER — MORPHINE SULFATE ER 30 MG PO TBCR
30.0000 mg | EXTENDED_RELEASE_TABLET | Freq: Two times a day (BID) | ORAL | Status: DC
Start: 2024-08-14 — End: 2024-08-16
  Administered 2024-08-15 – 2024-08-16 (×2): 30 mg via ORAL
  Filled 2024-08-14 (×2): qty 2

## 2024-08-14 MED ORDER — FENTANYL CITRATE (PF) 50 MCG/ML IJ SOSY
50.0000 ug | PREFILLED_SYRINGE | Freq: Once | INTRAMUSCULAR | Status: AC
  Administered 2024-08-14: 50 ug via INTRAVENOUS
  Filled 2024-08-14: qty 1

## 2024-08-14 MED ORDER — IOHEXOL 350 MG/ML SOLN
75.0000 mL | Freq: Once | INTRAVENOUS | Status: AC | PRN
  Administered 2024-08-14: 75 mL via INTRAVENOUS

## 2024-08-14 MED ORDER — ALBUTEROL SULFATE (2.5 MG/3ML) 0.083% IN NEBU: 2.5000 mg | INHALATION_SOLUTION | RESPIRATORY_TRACT | Status: DC | PRN

## 2024-08-14 MED ORDER — TRAZODONE HCL 50 MG PO TABS: 25.0000 mg | ORAL_TABLET | Freq: Every evening | ORAL | Status: DC | PRN

## 2024-08-14 MED ORDER — ONDANSETRON HCL 4 MG PO TABS: 4.0000 mg | ORAL_TABLET | Freq: Four times a day (QID) | ORAL | Status: DC | PRN

## 2024-08-14 MED ORDER — ACETAMINOPHEN 325 MG PO TABS: 650.0000 mg | ORAL_TABLET | Freq: Four times a day (QID) | ORAL | Status: DC | PRN

## 2024-08-14 MED ORDER — SODIUM CHLORIDE 0.9 % IV SOLN: INTRAVENOUS | Status: AC | PRN

## 2024-08-14 MED ORDER — SODIUM CHLORIDE 0.9 % IV BOLUS
500.0000 mL | Freq: Once | INTRAVENOUS | Status: AC
  Administered 2024-08-14: 500 mL via INTRAVENOUS

## 2024-08-14 MED ORDER — SODIUM CHLORIDE 0.9 % IV SOLN
1.0000 g | Freq: Once | INTRAVENOUS | Status: AC
  Administered 2024-08-14: 1 g via INTRAVENOUS
  Filled 2024-08-14: qty 10

## 2024-08-14 MED ORDER — SODIUM CHLORIDE 0.9 % IV SOLN
500.0000 mg | Freq: Once | INTRAVENOUS | Status: AC
  Administered 2024-08-14: 500 mg via INTRAVENOUS
  Filled 2024-08-14: qty 5

## 2024-08-14 MED ORDER — BUDESON-GLYCOPYRROL-FORMOTEROL 160-9-4.8 MCG/ACT IN AERO
2.0000 | INHALATION_SPRAY | Freq: Two times a day (BID) | RESPIRATORY_TRACT | Status: DC
  Administered 2024-08-14 – 2024-08-16 (×4): 2 via RESPIRATORY_TRACT
  Filled 2024-08-14: qty 5.9

## 2024-08-14 MED ORDER — PANTOPRAZOLE SODIUM 40 MG PO TBEC: 40.0000 mg | DELAYED_RELEASE_TABLET | Freq: Two times a day (BID) | ORAL | Status: DC

## 2024-08-14 MED ORDER — FLUTICASONE PROPIONATE 50 MCG/ACT NA SUSP: 2.0000 | Freq: Every day | NASAL | Status: DC | PRN

## 2024-08-14 MED ORDER — VANCOMYCIN HCL 1750 MG/350ML IV SOLN
1750.0000 mg | INTRAVENOUS | Status: DC
  Administered 2024-08-14: 1750 mg via INTRAVENOUS
  Filled 2024-08-14: qty 350

## 2024-08-14 NOTE — Telephone Encounter (Signed)
 I spoke with patient's spouse to schedule hospital follow up with Dr. Autumn on 08/30/2024. Patient aware of date/time.

## 2024-08-14 NOTE — H&P (Signed)
 History and Physical  Gilbert Thompson FMW:[id number] DOB: 06/20/1951 DOA: 08/14/2024  PCP: Windy Coy, MD   Chief Complaint: Diarrhea, extremity swelling  HPI: Gilbert Thompson is a 73 y.o. 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  The patient presented to cancer center today and received his penultimate radiation therapy, while there he had some concerns which prompted ER evaluation.  History is provided by the patient's wife and daughter who are at the bedside, along with the patient himself.  They tell me that since he was discharged from the hospital on 11/22, he has had diarrhea.  This actually is stool incontinence, happening several times through the night, and after that over the last 2 days the patient has had 2-3 episodes of watery diarrhea per day.  During the day he is not incontinent.  He has not had any blood in his stool, no significant abdominal pain, cramping, or nausea.  At home, he has had some low-grade fevers as high as 100.4.  He does not have any significant cough, he gets short of breath with exertion, but not at rest.  He also noticed some left upper extremity edema which is pitting, family at the bedside states that this has been going on for over a week now, was present during his last hospitalization.  Review of Systems: Please see HPI for pertinent positives and negatives. A complete 10 system review of systems are otherwise negative.  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, follwed by [facility]   Dyspnea    with exertion    GERD (gastroesophageal reflux disease)    Hyperlipidemia    Hypertension    Pneumonia    Past Surgical History:  Procedure Laterality Date   CATARACT EXTRACTION Bilateral    IR GASTROSTOMY TUBE MOD SED  06/23/2024   IR IMAGING GUIDED PORT INSERTION  06/23/2024   LEFT HEART CATH AND CORONARY ANGIOGRAPHY N/A 06/05/2022   Procedure: LEFT HEART CATH AND CORONARY ANGIOGRAPHY;  Surgeon: Claudene Victory ORN, MD;  Location: [facility] INVASIVE CV LAB;  Service: Cardiovascular;  Laterality: N/A;   LYMPHADENECTOMY Bilateral 04/29/2023   Procedure: BILATERAL PELVIC LYMPHADENECTOMY;  Surgeon: Renda Glance, MD;  Location: [facility] ORS;  Service: Urology;  Laterality: Bilateral;   PROSTATE BIOPSY     ROBOT ASSISTED LAPAROSCOPIC RADICAL PROSTATECTOMY N/A 04/29/2023   Procedure: XI ROBOTIC ASSISTED LAPAROSCOPIC RADICAL PROSTATECTOMY LEVEL 2;  Surgeon: Renda Glance, MD;  Location: [facility] ORS;  Service: Urology;  Laterality: N/A;  210 MINUTES NEEDED FOR CASE   UPPER GASTROINTESTINAL ENDOSCOPY     VENTRAL HERNIA REPAIR N/A 10/05/2023   Procedure: LAPAROSCOPIC VENTRAL HERNIA REPAAIR WITH MESH;  Surgeon: Rubin Calamity, MD;  Location: [facility] OR;  Service: General;  Laterality: N/A;   Social History:  reports that he has never smoked. He has never used smokeless tobacco. He reports current alcohol use of about 14.0 standard drinks of alcohol per week. He reports that he does not use drugs.  No Known Allergies  Family History  Problem Relation Age of Onset  Coronary artery disease Father    Prostate cancer Father 15 - 65   Leukemia Father 8   Coronary artery disease Brother    Prostate cancer Brother 38 - 59   Colon cancer Brother 45   Pancreatic cancer Cousin        maternal first cousin   Breast cancer Daughter 70       negative genetic testing   Esophageal cancer Neg Hx    Rectal cancer Neg Hx    Stomach cancer Neg Hx      Prior to Admission medications   Medication Sig Start Date End Date Taking? Authorizing  Provider  acetaminophen  (TYLENOL ) 325 MG tablet Take 2 tablets (650 mg total) by mouth every 6 (six) hours as needed for mild pain (pain score 1-3) or fever (or Fever >/= 101). 08/12/24   Sebastian Toribio GAILS, MD  albuterol  (VENTOLIN  HFA) 108 [phone] Base) MCG/ACT inhaler Inhale 1-2 puffs into the lungs 3 (three) times daily for 5 days, THEN 1-2 puffs every 4 (four) hours as needed for wheezing or shortness of breath. 08/12/24 08/17/25  Sebastian Toribio GAILS, MD  allopurinol  (ZYLOPRIM ) 300 MG tablet Take 300 mg by mouth daily. 03/03/22   [provider]  ALPRAZolam  (XANAX ) 1 MG tablet Take 1 mg by mouth daily as needed for anxiety or sleep. 03/05/22   [provider]  amoxicillin -clavulanate (AUGMENTIN ) 875-125 MG tablet Take 1 tablet by mouth every 12 (twelve) hours for 4 days. 08/12/24 08/16/24  Sebastian Toribio GAILS, MD  aspirin  EC 81 MG tablet Take 81 mg by mouth daily. Swallow whole.    [provider]  celecoxib (CELEBREX) 200 MG capsule  03/07/24   [provider]  cholecalciferol  (VITAMIN D3) 25 MCG (1000 UNIT) tablet Take 1,000 Units by mouth daily.    [provider]  dexamethasone  (DECADRON ) 4 MG tablet Take 2 tablets (8 mg) by mouth daily x 3 days starting the day after cisplatin  chemotherapy. Take with food. 06/07/24   Pasam, Chinita, MD  ezetimibe  (ZETIA ) 10 MG tablet TAKE 1 TABLET BY MOUTH DAILY 05/18/24   Pietro Redell RAMAN, MD  finasteride  (PROSCAR ) 5 MG tablet Take 5 mg by mouth daily.    [provider]  fluticasone  (FLONASE ) 50 MCG/ACT nasal spray Place 2 sprays into both nostrils daily for 7 days. 08/13/24 08/20/24  Sebastian Toribio GAILS, MD  HYDROcodone -acetaminophen  (NORCO/VICODIN) 5-325 MG tablet Take 1 tablet by mouth every 6 (six) hours as needed for severe pain. 01/28/22   [provider]  lidocaine  (XYLOCAINE ) 2 % solution Patient: Mix 1part 2% viscous lidocaine , 1part H20. Swish & swallow 10mL of diluted mixture, before meals  and at bedtime, up to QID 07/03/24   Izell Domino, MD  lidocaine -prilocaine  (EMLA ) cream Apply to affected area once Patient not taking: Reported on 08/07/2024 06/07/24   Pasam, Chinita, MD  loratadine  (CLARITIN ) 10 MG tablet Place 1 tablet (10 mg total) into feeding tube daily. 08/13/24   Sebastian Toribio GAILS, MD  magic mouthwash (nystatin , lidocaine , diphenhydrAMINE , alum & mag hydroxide) suspension Swish and swallow 5 mLs by mouth 4 (four) times daily as needed for mouth pain. Suspension contains equal amounts of Maalox Extra Strength, nystatin , diphenhydramine  and lidocaine . Swish and Swallow Patient taking differently: Take 5 mLs by mouth 3 (three) times daily. 07/19/24   Pasam, Avinash, MD  metaxalone (SKELAXIN) 800 MG tablet Take 400 mg by mouth 3 (three) times daily as needed for muscle spasms.    [provider]  morphine  (MS CONTIN ) 30 MG 12 hr tablet Take 1 tablet (30 mg total) by mouth every 12 (twelve) hours. 08/07/24   Pasam, Chinita, MD  nitroGLYCERIN  (NITROSTAT ) 0.4 MG SL tablet Place 1 tablet (0.4 mg total) under the tongue every 5 (five) minutes as needed for chest pain. 05/15/22 08/07/24  Daneen Damien BROCKS, NP  nystatin  (MYCOSTATIN ) 100000 UNIT/ML suspension Take 5 mLs (500,000 Units total) by mouth 4 (four) times daily for 7 days. 08/12/24 08/19/24  Sebastian Toribio GAILS, MD  ondansetron  (ZOFRAN ) 8 MG tablet Take 1 tablet (8 mg total) by mouth every 8 (eight) hours as needed for nausea or vomiting. Start on the third day after cisplatin . 06/07/24   Pasam, Avinash, MD  oxyCODONE  10 MG TABS Take 1 tablet (10 mg total) by mouth every 6 (six) hours as needed. Patient taking differently: Take 20 mg by mouth daily. 07/19/24   Pasam, Chinita, MD  pantoprazole  (PROTONIX ) 40 MG tablet Take 1 tablet (40 mg total) by mouth 2 (two) times daily. 03/19/23   Jerilynn Lamarr HERO, NP  polyethylene glycol (MIRALAX  / GLYCOLAX ) 17 g packet Take 17 g by mouth daily as needed for moderate constipation.     [provider]  prochlorperazine  (COMPAZINE ) 10 MG tablet Take 1 tablet (10 mg total) by mouth every 6 (six) hours as needed (Nausea or vomiting). 06/07/24   Pasam, Chinita, MD  rosuvastatin  (CRESTOR ) 40 MG tablet Take 1 tablet (40 mg total) by mouth daily. Patient not taking: Reported on 08/07/2024 03/19/23   Jerilynn Lamarr HERO, NP  Tetrahydrozoline HCl (REDNESS RELIEVER EYE DROPS OP) Place 1 drop into both eyes daily as needed (redness).    [provider]  TRELEGY ELLIPTA 100-62.5-25 MCG/ACT AEPB Inhale 1 puff into the lungs daily. 04/04/22   [provider]    Physical Exam: BP 126/70 (BP Location: Right Arm)   Pulse 92   Temp 97.7 F (36.5 C) (Oral)   Resp 20   Ht 5' 9 (1.753 m)   Wt 80.7 kg   SpO2 96%   BMI 26.27 kg/m  General:  Alert, oriented, calm, in no acute distress, resting comfortably on room air, patient appears chronically ill but not in acute distress.  His wife and daughter are at the bedside. Eyes: EOMI, clear conjuctivae, white sclerea Neck: supple, no masses, he has obvious radiation changes to his neck especially on the left side Cardiovascular: RRR, no murmurs or rubs, no peripheral edema  Respiratory: clear to auscultation bilaterally, no wheezes, no crackles  Abdomen: soft, nontender, nondistended, normal bowel tones heard, G-tube in place Skin: dry, no rashes  Musculoskeletal: no joint effusions, normal range of motion  Psychiatric: appropriate affect, normal speech  Neurologic: extraocular muscles intact, clear speech, moving all extremities with intact sensorium         Labs on Admission:  Basic Metabolic Panel: Recent Labs  Lab 08/08/24 0405 08/08/24 1744 08/09/24 0355 08/09/24 1731 08/10/24 0553 08/11/24 0600 08/12/24 0444 08/14/24 1155 08/14/24 1400  NA 131*  --  135  --  135 133* 132* 132*  --   K 4.1  --  3.5  --  3.4* 3.5 3.3* 3.7  --   CL 98  --  100  --  102 100 96* 96*  --   CO2 24  --  21*  --  24 25 25 26    --   GLUCOSE 190*  --  111*  --  132* 96 115* 112*  --  BUN 15  --  22  --  21 16 18 18   --   CREATININE 0.93  --  1.09  --  0.93 0.83 0.84 0.95  --   CALCIUM  8.4*  --  9.2  --  8.6* 8.2* 8.4* 9.0  --   MG 2.2 1.9 2.0 1.7 1.7 1.7 1.8  --  1.5*  PHOS 4.2 3.0 2.4* 1.8* 2.5  --   --   --   --    Liver Function Tests: Recent Labs  Lab 08/09/24 0355 08/10/24 0553 08/11/24 0930 08/12/24 0444 08/14/24 1155  AST 127* 114* 67* 72* 61*  ALT 103* 116* 90* 88* 69*  ALKPHOS 57 69 67 84 96  BILITOT 0.5 0.4 0.5 0.4 0.4  PROT 6.1* 5.9* 5.8* 6.3* 7.2  ALBUMIN 2.6* 2.5* 2.4* 2.6* 2.6*   Recent Labs  Lab 08/14/24 1155  LIPASE 11   No results for input(s): AMMONIA in the last 168 hours. CBC: Recent Labs  Lab 08/09/24 0355 08/10/24 0553 08/11/24 0600 08/12/24 0444 08/14/24 1155  WBC 1.5* 1.6* 2.6* 3.3* 5.1  NEUTROABS 1.1* 1.1* 1.8 2.3 4.0  HGB 8.9* 8.3* 7.8* 8.4* 9.6*  HCT 25.3* 24.1* 22.3* 24.5* 28.3*  MCV 84.9 88.0 86.4 87.5 88.4  PLT 58* 62* 69* 96* 133*   Cardiac Enzymes: No results for input(s): CKTOTAL, CKMB, CKMBINDEX, TROPONINI in the last 168 hours. BNP (last 3 results) No results for input(s): BNP in the last 8760 hours.  ProBNP (last 3 results) Recent Labs    08/14/24 1155  PROBNP 677.0*    CBG: Recent Labs  Lab 08/11/24 1938 08/12/24 0018 08/12/24 0413 08/12/24 0800 08/12/24 1317  GLUCAP 112* 133* 131* 134* 130*    Radiological Exams on Admission: UE VENOUS DUPLEX (7am - 7pm) Result Date: 08/14/2024 UPPER VENOUS STUDY  Patient Name:  Gilbert Thompson  Date of Exam:   08/14/2024 Medical Rec #: [id number]        Accession #:    [id number] Date of Birth: August 21, 1951        Patient Gender: M Patient Age:   82 years Exam Location:  [facility] Procedure:      VAS US  UPPER EXTREMITY VENOUS DUPLEX Referring Phys: ADAM CURATOLO --------------------------------------------------------------------------------  Indications: Swelling Risk Factors:  Primary squamous cell carcinoma of oropharynx, chemotherapy/radiation. Limitations: Body habitus and poor ultrasound/tissue interface. Comparison Study: No previous exams Performing Technologist: Alberta Lis RVS  Examination Guidelines: A complete evaluation includes B-mode imaging, spectral Doppler, color Doppler, and power Doppler as needed of all accessible portions of each vessel. Bilateral testing is considered an integral part of a complete examination. Limited examinations for reoccurring indications may be performed as noted.  Right Findings: +----------+------------+---------+-----------+----------+-----------------+ RIGHT     CompressiblePhasicitySpontaneousProperties     Summary      +----------+------------+---------+-----------+----------+-----------------+ Subclavian               Yes       Yes              patent by doppler +----------+------------+---------+-----------+----------+-----------------+  Left Findings: +----------+------------+---------+-----------+----------+--------------------+ LEFT      CompressiblePhasicitySpontaneousProperties      Summary        +----------+------------+---------+-----------+----------+--------------------+ IJV                      Yes       Yes                   patent by  color/doppler     +----------+------------+---------+-----------+----------+--------------------+ Subclavian               Yes       Yes                   patent by                                                              color/doppler     +----------+------------+---------+-----------+----------+--------------------+ Axillary                 Yes       Yes                   patent by                                                              color/doppler     +----------+------------+---------+-----------+----------+--------------------+ Brachial      Full        Yes       Yes                                   +----------+------------+---------+-----------+----------+--------------------+ Radial        Full                                                       +----------+------------+---------+-----------+----------+--------------------+ Ulnar         Full                                                       +----------+------------+---------+-----------+----------+--------------------+ Cephalic      Full                                                       +----------+------------+---------+-----------+----------+--------------------+ Basilic                                                Not visualized    +----------+------------+---------+-----------+----------+--------------------+ enlarged lymph nodes seen in neck (left side).  Summary:  Right: No evidence of thrombosis in the subclavian.  Left: No evidence of deep vein thrombosis in the upper extremity. No evidence of superficial vein thrombosis in the upper extremity. However, unable to visualize the basilic vein. Subcutaneous edema throughout upper extremity.  *See table(s) above for measurements and observations.  Diagnosing physician: Lonni Gaskins MD Electronically signed by  Lonni Gaskins MD on 08/14/2024 at 4:59:56 PM.    Final    CT Angio Chest PE W and/or Wo Contrast Result Date: 08/14/2024 CLINICAL DATA:  Weakness, fever, diarrhea, head and neck cancer EXAM: CT ANGIOGRAPHY CHEST WITH CONTRAST TECHNIQUE: Multidetector CT imaging of the chest was performed using the standard protocol during bolus administration of intravenous contrast. Multiplanar CT image reconstructions and MIPs were obtained to evaluate the vascular anatomy. RADIATION DOSE REDUCTION: This exam was performed according to the departmental dose-optimization program which includes automated exposure control, adjustment of the mA and/or kV according to patient size and/or use of iterative  reconstruction technique. CONTRAST:  75mL OMNIPAQUE  IOHEXOL  350 MG/ML SOLN COMPARISON:  08/07/2024 FINDINGS: Cardiovascular: This is a technically adequate evaluation of the pulmonary vasculature. No filling defects or pulmonary emboli. The heart is unremarkable without pericardial effusion. No evidence of thoracic aortic aneurysm or dissection. Atherosclerosis of the aorta and coronary vasculature. Right chest wall port via internal jugular approach tip within the superior vena cava. Mediastinum/Nodes: Right hilar adenopathy measuring up to 1.3 cm in short axis. No other pathologic adenopathy within the mediastinum or axillary regions. Thyroid , trachea, and esophagus are unremarkable. Lungs/Pleura: Since the previous exam, there has been progression of the multifocal bilateral airspace disease, greatest in the right upper lobe. Interval development of trace bilateral pleural effusions. No pneumothorax. The central airways are patent. Upper Abdomen: Percutaneous gastrostomy tube within the lumen of the gastric antrum. No acute upper abdominal findings. Musculoskeletal: No acute or destructive bony abnormalities. Reconstructed images demonstrate no additional findings. Review of the MIP images confirms the above findings. IMPRESSION: 1. No evidence of pulmonary embolus. 2. Progressive multifocal bilateral airspace disease, greatest in the right upper lobe, consistent with bilateral bronchopneumonia. 3. Trace bilateral parapneumonic effusions. 4. Stable right hilar lymphadenopathy. 5. Aortic Atherosclerosis (ICD10-I70.0). Coronary artery atherosclerosis. The Electronically Signed   By: Ozell Daring M.D.   On: 08/14/2024 16:08   DG Chest Portable 1 View Result Date: 08/14/2024 CLINICAL DATA:  Shortness of breath. EXAM: PORTABLE CHEST 1 VIEW COMPARISON:  Chest radiograph dated 08/07/2024. FINDINGS: Right-sided Port-A-Cath with tip at the cavoatrial junction. Right mid to lower lung field hazy density may represent  atelectasis or pneumonia. No pleural effusion pneumothorax. Stable cardiac silhouette. No acute osseous pathology. IMPRESSION: Right mid to lower lung field atelectasis or pneumonia. Electronically Signed   By: Vanetta Chou M.D.   On: 08/14/2024 13:10   Assessment/Plan Gilbert Thompson is a 73 y.o. 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  Oropharyngeal carcinoma-completed chemotherapy, currently on radiation -Plan for final radiation therapy tomorrow 11/25 -Continue chronic pain regimen, with IV Dilaudid  for breakthrough pain  Extremity edema-left upper extremity Doppler has ruled out DVT, pitting edema is likely due to a combination of low oncotic pressure from his malnutrition as well as poor drainage due to left-sided lymphadenopathy and radiation changes -Elevate extremity as able -Evaluate for heart failure as below  Multifocal pneumonia-patient was treated empirically for her multifocal pneumonia during his last hospitalization, discharged home with a course of p.o. Augmentin .  While CT shows worsening consolidation, patient is stable on room air without fevers, cough or leukocytosis. -Continue Breztri , Flonase , Xopenex  nebs -IV antibiotics while in house  Diarrhea-given lack of fever, significant abdominal pain, or leukocytosis suspect  this is more to do with his nutritional supplements (diarrhea started at home after he was switched from his continuous tube feeds to bolus feedings at home) rather than an infectious diarrhea. -Will resume continuous feeds with Osmolite 1.5 like he was taking in the hospital previously -RD consult -Loperamide  as needed  Transaminitis-noted on last hospitalization, resolving.   Continue to avoid hepatotoxins as able.  Grade 1 diastolic dysfunction-patient does not look overtly volume overloaded, however he does have slightly elevated BNP.  Presumably he could have worsening cardiac function contributing to his extremity edema, dyspnea on exertion, etc. -2D echo  DVT prophylaxis: Lovenox      Code Status: Full Code  Consults called: None  Admission status: The appropriate patient status for this patient is INPATIENT. Inpatient status is judged to be reasonable and necessary in order to provide the required intensity of service to ensure the patient's safety. The patient's presenting symptoms, physical exam findings, and initial radiographic and laboratory data in the context of their chronic comorbidities is felt to place them at high risk for further clinical deterioration. Furthermore, it is not anticipated that the patient will be medically stable for discharge from the hospital within 2 midnights of admission.    I certify that at the point of admission it is my clinical judgment that the patient will require inpatient hospital care spanning beyond 2 midnights from the point of admission due to high intensity of service, high risk for further deterioration and high frequency of surveillance required  Time spent: 59 minutes  Khayla Koppenhaver CHRISTELLA Gail MD [facility] Pager [phone]  If 7PM-7AM, please contact night-coverage [email or web address] Password [email or web address]  08/14/2024, 5:49 PM

## 2024-08-14 NOTE — Progress Notes (Signed)
 Pharmacy Antibiotic Note  Gilbert Thompson is a 73 y.o. male admitted on 08/14/2024 with pneumonia.  Pharmacy has been consulted for vancomycin /cefepime  dosing.  Plan: Vancomycin  1750mg  IV q24 - goal AUC 400-550 Cefepime  2g IV q8 Discontinue vancomycin  if MRSA PCR negative  Height: 5' 9 (175.3 cm) Weight: 80.7 kg (177 lb 14.6 oz) IBW/kg (Calculated) : 70.7  Temp (24hrs), Avg:97.8 F (36.6 C), Min:97.7 F (36.5 C), Max:97.8 F (36.6 C)  Recent Labs  Lab 08/07/24 2142 08/08/24 0405 08/09/24 0355 08/10/24 0553 08/11/24 0600 08/12/24 0444 08/14/24 1155  WBC  --    < > 1.5* 1.6* 2.6* 3.3* 5.1  CREATININE  --    < > 1.09 0.93 0.83 0.84 0.95  LATICACIDVEN 1.3  --   --   --   --   --   --    < > = values in this interval not displayed.    Estimated Creatinine Clearance: 68.2 mL/min (by C-G formula based on SCr of 0.95 mg/dL).    No Known Allergies    Thank you for allowing pharmacy to be a part of this patient's care.  Britta Eva Na 08/14/2024 5:50 PM

## 2024-08-14 NOTE — Progress Notes (Signed)
 Accessed Right chest port with 20 G power needle. Patient tolerated well. Blood return noted and saline locked.

## 2024-08-14 NOTE — Progress Notes (Signed)
 VASCULAR LAB    Left upper extremity venous duplex has been performed.  See CV proc for preliminary results.  Gave verbal report to Dr. Ruthe LIS, [facility], RVT 08/14/2024, 2:37 PM

## 2024-08-14 NOTE — Progress Notes (Signed)
 I saw the patient today after his radiation treatment.  He finishes his radiation therapy tomorrow.  He has had frequent diarrhea since being discharged 2 days ago.  Medical oncology has advised our team to send him to the emergency room.  I wrote a list of concerns for the patient's wife to share with the emergency provider.  First of all he has had weight gain despite having diarrhea and there is a concern that he is third spacing fluid.  There is a concern that his diarrhea may be due to C. difficile.  He also has pitting edema in his left upper extremity.  I have asked the wife to inquire as to whether Doppler ultrasound would be advisable to rule out a DVT.  He has also had shortness of breath with walking.  This was present a week ago when he was hospitalized but a CT angio of the chest was negative.  He is somewhat tachycardic today.  I am curious if a repeat CT angio of the chest is indicated -----------------------------------  Lauraine Golden, MD

## 2024-08-14 NOTE — ED Triage Notes (Signed)
 Pt brought to ED from cancer center.  Recently dc from hospital on Saturday 11/22.  Has had bouts of diarrhea.  At radiation today he felt weak, low grade temp.  Dr Belle concerned of possible dehydration/infection.  Wife at bedside.  Alert and oriented.

## 2024-08-14 NOTE — ED Notes (Signed)
 Pt brought to ED from cancer center.  Recently dc from hospital on Saturday 11/22.  Has had bouts of diarrhea.  At radiation today he felt weak, low grade temp.  Dr Belle concerned of possible dehydration/infection.  Wife at bedside.  Alert and oriented.

## 2024-08-14 NOTE — Telephone Encounter (Signed)
 Followed up with Heron, patient's wife, regarding a new onset of diarrhea. Did leave a voicemail verbalizing concern and awareness that patient was being cared for at the emergency room per Dr. Loni note.

## 2024-08-14 NOTE — ED Provider Notes (Addendum)
 [facility] Provider Note   CSN: [id number] Arrival date & time: 08/14/24  1142     Patient presents with: Diarrhea (Possible dehydration/)   Gilbert Thompson is a 73 y.o. male.   Patient here with diarrhea, swelling for left upper arm, ongoing swelling to the lower legs.  History of neck cancer does had radiation therapy today has been undergoing this recently.  Was just admitted for infectious workup recently.  Diarrhea the last couple days several episodes a day.  Still on some amoxicillin .  Sent here by radiation oncology team for evaluation.  Patient denies any shortness of breath abdominal pain.  Has not vomited.  Patient is fed through feeding tube.  Trying to get more protein and nutrition.  Denies any fevers or chills.  Overall been dealing with some nutrition issues recently.  Patient sent here for evaluation for possible blood clot in the left upper arm given some swelling in the left upper extremity that appears to be new.  Primary doctor has ordered a C. difficile panel but they have not been able to collect a stool sample yet.  The history is provided by the patient and a caregiver.       Prior to Admission medications   Medication Sig Start Date End Date Taking? Authorizing Provider  acetaminophen  (TYLENOL ) 325 MG tablet Take 2 tablets (650 mg total) by mouth every 6 (six) hours as needed for mild pain (pain score 1-3) or fever (or Fever >/= 101). 08/12/24   Sebastian Toribio GAILS, MD  albuterol  (VENTOLIN  HFA) 108 [phone] Base) MCG/ACT inhaler Inhale 1-2 puffs into the lungs 3 (three) times daily for 5 days, THEN 1-2 puffs every 4 (four) hours as needed for wheezing or shortness of breath. 08/12/24 08/17/25  Sebastian Toribio GAILS, MD  allopurinol  (ZYLOPRIM ) 300 MG tablet Take 300 mg by mouth daily. 03/03/22   [provider]  ALPRAZolam  (XANAX ) 1 MG tablet Take 1 mg by mouth daily as needed for anxiety or sleep. 03/05/22   [provider]  amoxicillin -clavulanate (AUGMENTIN ) 875-125 MG tablet Take 1 tablet by mouth every 12 (twelve) hours for 4 days. 08/12/24 08/16/24  Sebastian Toribio GAILS, MD  aspirin  EC 81 MG tablet Take 81 mg by mouth daily. Swallow whole.    [provider]  celecoxib (CELEBREX) 200 MG capsule  03/07/24   [provider]  cholecalciferol  (VITAMIN D3) 25 MCG (1000 UNIT) tablet Take 1,000 Units by mouth daily.    [provider]  dexamethasone  (DECADRON ) 4 MG tablet Take 2 tablets (8 mg) by mouth daily x 3 days starting the day after cisplatin  chemotherapy. Take with food. 06/07/24   Pasam, Chinita, MD  ezetimibe  (ZETIA ) 10 MG tablet TAKE 1 TABLET BY MOUTH DAILY 05/18/24   Pietro Redell RAMAN, MD  finasteride  (PROSCAR ) 5 MG tablet Take 5 mg by mouth daily.    [provider]  fluticasone  (FLONASE ) 50 MCG/ACT nasal spray Place 2 sprays into both nostrils daily for 7 days. 08/13/24 08/20/24  Sebastian Toribio GAILS, MD  HYDROcodone -acetaminophen  (NORCO/VICODIN) 5-325 MG tablet Take 1 tablet by mouth every 6 (six) hours as needed for severe pain. 01/28/22   [provider]  lidocaine  (XYLOCAINE ) 2 % solution Patient: Mix 1part 2% viscous lidocaine , 1part H20. Swish & swallow 10mL of diluted mixture, 30min before meals and at bedtime, up to QID 07/03/24   Izell Domino, MD  lidocaine -prilocaine  (EMLA ) cream Apply to affected area once Patient not  taking: Reported on 08/07/2024 06/07/24   Pasam, Chinita, MD  loratadine  (CLARITIN ) 10 MG tablet Place 1 tablet (10 mg total) into feeding tube daily. 08/13/24   Sebastian Toribio GAILS, MD  magic mouthwash (nystatin , lidocaine , diphenhydrAMINE , alum & mag hydroxide) suspension Swish and swallow 5 mLs by mouth 4 (four) times daily as needed for mouth pain. Suspension contains equal amounts of Maalox Extra Strength, nystatin , diphenhydramine  and lidocaine . Swish and Swallow Patient taking differently: Take 5 mLs by mouth 3 (three) times  daily. 07/19/24   Pasam, Avinash, MD  metaxalone (SKELAXIN) 800 MG tablet Take 400 mg by mouth 3 (three) times daily as needed for muscle spasms.    [provider]  morphine  (MS CONTIN ) 30 MG 12 hr tablet Take 1 tablet (30 mg total) by mouth every 12 (twelve) hours. 08/07/24   Pasam, Chinita, MD  nitroGLYCERIN  (NITROSTAT ) 0.4 MG SL tablet Place 1 tablet (0.4 mg total) under the tongue every 5 (five) minutes as needed for chest pain. 05/15/22 08/07/24  Daneen Damien BROCKS, NP  nystatin  (MYCOSTATIN ) 100000 UNIT/ML suspension Take 5 mLs (500,000 Units total) by mouth 4 (four) times daily for 7 days. 08/12/24 08/19/24  Sebastian Toribio GAILS, MD  ondansetron  (ZOFRAN ) 8 MG tablet Take 1 tablet (8 mg total) by mouth every 8 (eight) hours as needed for nausea or vomiting. Start on the third day after cisplatin . 06/07/24   Pasam, Avinash, MD  oxyCODONE  10 MG TABS Take 1 tablet (10 mg total) by mouth every 6 (six) hours as needed. Patient taking differently: Take 20 mg by mouth daily. 07/19/24   Pasam, Chinita, MD  pantoprazole  (PROTONIX ) 40 MG tablet Take 1 tablet (40 mg total) by mouth 2 (two) times daily. 03/19/23   Jerilynn Lamarr HERO, NP  polyethylene glycol (MIRALAX  / GLYCOLAX ) 17 g packet Take 17 g by mouth daily as needed for moderate constipation.    [provider]  prochlorperazine  (COMPAZINE ) 10 MG tablet Take 1 tablet (10 mg total) by mouth every 6 (six) hours as needed (Nausea or vomiting). 06/07/24   Pasam, Chinita, MD  rosuvastatin  (CRESTOR ) 40 MG tablet Take 1 tablet (40 mg total) by mouth daily. Patient not taking: Reported on 08/07/2024 03/19/23   Jerilynn Lamarr HERO, NP  Tetrahydrozoline HCl (REDNESS RELIEVER EYE DROPS OP) Place 1 drop into both eyes daily as needed (redness).    [provider]  TRELEGY ELLIPTA 100-62.5-25 MCG/ACT AEPB Inhale 1 puff into the lungs daily. 04/04/22   [provider]    Allergies: Patient has no known allergies.    Review of  Systems  Updated Vital Signs BP 121/66 (BP Location: Left Arm)   Pulse (!) 112   Temp 97.8 F (36.6 C) (Oral)   Resp (!) 22   Ht 5' 9 (1.753 m)   Wt 80.7 kg   SpO2 96%   BMI 26.27 kg/m   Physical Exam Vitals and nursing note reviewed.  Constitutional:      General: He is not in acute distress.    Appearance: He is well-developed. He is not ill-appearing.  HENT:     Head: Normocephalic and atraumatic.     Nose: Nose normal.     Mouth/Throat:     Mouth: Mucous membranes are moist.  Eyes:     Conjunctiva/sclera: Conjunctivae normal.     Pupils: Pupils are equal, round, and reactive to light.  Cardiovascular:     Rate and Rhythm: Normal rate and regular rhythm.     Heart  sounds: No murmur heard. Pulmonary:     Effort: Pulmonary effort is normal. No respiratory distress.     Breath sounds: Normal breath sounds.  Abdominal:     Palpations: Abdomen is soft.     Tenderness: There is no abdominal tenderness.  Musculoskeletal:        General: No swelling.     Cervical back: Neck supple.     Comments: He is got mild edema in his legs but symmetric little bit more edema in the left upper extremity compared to the right upper extremity  Skin:    General: Skin is warm and dry.     Capillary Refill: Capillary refill takes less than 2 seconds.  Neurological:     General: No focal deficit present.     Mental Status: He is alert and oriented to person, place, and time.     Cranial Nerves: No cranial nerve deficit.     Sensory: No sensory deficit.     Motor: No weakness.     Coordination: Coordination normal.  Psychiatric:        Mood and Affect: Mood normal.     (all labs ordered are listed, but only abnormal results are displayed) Labs Reviewed  CBC WITH DIFFERENTIAL/PLATELET - Abnormal; Notable for the following components:      Result Value   RBC 3.20 (*)    Hemoglobin 9.6 (*)    HCT 28.3 (*)    RDW 17.1 (*)    Platelets 133 (*)    Lymphs Abs 0.6 (*)    Abs Immature  Granulocytes 0.27 (*)    All other components within normal limits  COMPREHENSIVE METABOLIC PANEL WITH GFR - Abnormal; Notable for the following components:   Sodium 132 (*)    Chloride 96 (*)    Glucose, Bld 112 (*)    Albumin 2.6 (*)    AST 61 (*)    ALT 69 (*)    All other components within normal limits  PRO BRAIN NATRIURETIC PEPTIDE - Abnormal; Notable for the following components:   Pro Brain Natriuretic Peptide 677.0 (*)    All other components within normal limits  TROPONIN T, HIGH SENSITIVITY - Abnormal; Notable for the following components:   Troponin T High Sensitivity 34 (*)    All other components within normal limits  GASTROINTESTINAL PANEL BY PCR, STOOL (REPLACES STOOL CULTURE)  C DIFFICILE QUICK SCREEN W PCR REFLEX    LIPASE, BLOOD  URINALYSIS, ROUTINE W REFLEX MICROSCOPIC  MAGNESIUM   TROPONIN T, HIGH SENSITIVITY    EKG: None  Radiology: DG Chest Portable 1 View Result Date: 08/14/2024 CLINICAL DATA:  Shortness of breath. EXAM: PORTABLE CHEST 1 VIEW COMPARISON:  Chest radiograph dated 08/07/2024. FINDINGS: Right-sided Port-A-Cath with tip at the cavoatrial junction. Right mid to lower lung field hazy density may represent atelectasis or pneumonia. No pleural effusion pneumothorax. Stable cardiac silhouette. No acute osseous pathology. IMPRESSION: Right mid to lower lung field atelectasis or pneumonia. Electronically Signed   By: Vanetta Chou M.D.   On: 08/14/2024 13:10     Procedures   Medications Ordered in the ED  sodium chloride  0.9 % bolus 500 mL (0 mLs Intravenous Stopped 08/14/24 1257)  fentaNYL  (SUBLIMAZE ) injection 50 mcg (50 mcg Intravenous Given 08/14/24 1257)                                    Medical Decision Making Amount and/or  Complexity of Data Reviewed Labs: ordered. Radiology: ordered.  Risk Prescription drug management.   Gilbert Thompson is here with diarrhea left upper extremity swelling.  Sent here by radiation oncologist.   Will get DVT study of the left upper extremity but overall symmetric swelling to the lower extremities.  Sounds like has been dealing with some malnutrition and protein deficiency.  Patient is fed through feeding tube sometimes continuous feed and try to get more protein in.  Denies any shortness of breath weakness numbness tingling.  Just had hospitalization and had antibiotics.  Has been trying to collect a C. difficile sample since diarrhea started yesterday.  History of hypertension high cholesterol neck cancer.  Undergoing radiation treatment for the neck.  I suspect that swelling of the left upper extremity could be secondary to radiation causing some lymph issues but will get ultrasound.  I do not see any signs of cellulitis.  Good good pulses on exam.  Does not have any shortness of breath just had a CT scan of the chest recently.  It seems less likely to be PE or other obstructive process but may consider CT of the chest.  Will try to send the C. difficile sample stool sample if patient can provide 1.  But since 3 episodes of diarrhea yesterday to today.  Does not sound quite like C. difficile.  Patient does not have fever but will try to send a stool sample.  Will check basic labs to evaluate for any electrolyte abnormalities dehydration.  But patient overall well-appearing.  Reassuring vitals.  He does not have any history of heart failure but does have history of CAD.  Does not take a diuretic.  My suspicion is that leg swelling is likely from protein deficiency and albumin issues but will evaluate for any cardiac process including heart failure.  Thus far lab work is unremarkable.  Patient has no significant leukocytosis or anemia.  Electrolytes are unremarkable.  No AKI.  Albumin is low which I suspect is the cause of his edema in his legs and possibly arm.  proBNP is unremarkable and doubt heart failure.  Troponin is 34 likely from chronic illnesses.  Will trend.  Does not have any chest pain.  EKG  shows sinus rhythm.  No ischemic changes.  Chest x-ray possibly with pneumonia versus atelectasis.  Clinically this seems less likely to be pneumonia.  Ultrasound of his left upper extremity shows no evidence of clot just subcutaneous edema.  Ultimately he is repeat troponin will get a CT scan of his chest for further evaluation.  He is not having any abdominal pain.  He has not provided a stool sample yet.  Patient handed off to oncoming ED staff with patient pending remaining workup.  This chart was dictated using voice recognition software.  Despite best efforts to proofread,  errors can occur which can change the documentation meaning.      Final diagnoses:  Diarrhea, unspecified type  Peripheral edema    ED Discharge Orders     None          Ruthe Cornet, DO 08/14/24 1406    Ruthe Cornet, DO 08/14/24 1434

## 2024-08-15 ENCOUNTER — Other Ambulatory Visit: Payer: Self-pay

## 2024-08-15 ENCOUNTER — Ambulatory Visit

## 2024-08-15 ENCOUNTER — Encounter: Payer: Self-pay | Admitting: Oncology

## 2024-08-15 ENCOUNTER — Inpatient Hospital Stay (HOSPITAL_COMMUNITY)

## 2024-08-15 DIAGNOSIS — D649 Anemia, unspecified: Secondary | ICD-10-CM | POA: Diagnosis not present

## 2024-08-15 DIAGNOSIS — J189 Pneumonia, unspecified organism: Secondary | ICD-10-CM | POA: Diagnosis not present

## 2024-08-15 DIAGNOSIS — T451X5A Adverse effect of antineoplastic and immunosuppressive drugs, initial encounter: Secondary | ICD-10-CM

## 2024-08-15 DIAGNOSIS — I5031 Acute diastolic (congestive) heart failure: Secondary | ICD-10-CM

## 2024-08-15 DIAGNOSIS — C109 Malignant neoplasm of oropharynx, unspecified: Secondary | ICD-10-CM | POA: Diagnosis not present

## 2024-08-15 DIAGNOSIS — R197 Diarrhea, unspecified: Secondary | ICD-10-CM | POA: Diagnosis not present

## 2024-08-15 DIAGNOSIS — D61810 Antineoplastic chemotherapy induced pancytopenia: Secondary | ICD-10-CM

## 2024-08-15 LAB — BASIC METABOLIC PANEL WITH GFR
Anion gap: 8 (ref 5–15)
BUN: 16 mg/dL (ref 8–23)
CO2: 27 mmol/L (ref 22–32)
Calcium: 8.4 mg/dL — ABNORMAL LOW (ref 8.9–10.3)
Chloride: 99 mmol/L (ref 98–111)
Creatinine, Ser: 0.77 mg/dL (ref 0.61–1.24)
GFR, Estimated: >60 mL/min (ref >60)
Glucose, Bld: 164 mg/dL — ABNORMAL HIGH (ref 70–99)
Potassium: 3.4 mmol/L — ABNORMAL LOW (ref 3.5–5.1)
Sodium: 133 mmol/L — ABNORMAL LOW (ref 135–145)

## 2024-08-15 LAB — C DIFFICILE QUICK SCREEN W PCR REFLEX
C Diff antigen: POSITIVE — AB
C Diff toxin: NEGATIVE

## 2024-08-15 LAB — CBC
HCT: 21.2 % — ABNORMAL LOW (ref 39.0–52.0)
Hemoglobin: 7.1 g/dL — ABNORMAL LOW (ref 13.0–17.0)
MCH: 30.1 pg (ref 26.0–34.0)
MCHC: 33.5 g/dL (ref 30.0–36.0)
MCV: 89.8 fL (ref 80.0–100.0)
Platelets: 112 K/uL — ABNORMAL LOW (ref 150–400)
RBC: 2.36 MIL/uL — ABNORMAL LOW (ref 4.22–5.81)
RDW: 17.2 % — ABNORMAL HIGH (ref 11.5–15.5)
WBC: 3.2 K/uL — ABNORMAL LOW (ref 4.0–10.5)
nRBC: 0 % (ref 0.0–0.2)

## 2024-08-15 LAB — RAD ONC ARIA SESSION SUMMARY
Course Elapsed Days: 49
Plan Fractions Treated to Date: 35
Plan Prescribed Dose Per Fraction: 2 Gy
Plan Total Fractions Prescribed: 35
Plan Total Prescribed Dose: 70 Gy
Reference Point Dosage Given to Date: 70 Gy
Reference Point Session Dosage Given: 2 Gy
Session Number: 35

## 2024-08-15 LAB — CLOSTRIDIUM DIFFICILE BY PCR, REFLEXED
Hypervirulent Strain: NEGATIVE
Toxigenic C. Difficile by PCR: NEGATIVE

## 2024-08-15 LAB — ECHOCARDIOGRAM COMPLETE
Area-P 1/2: 3.65 cm2
Height: 69 in
S' Lateral: 2.7 cm
Single Plane A2C EF: 62.5 %
Weight: 2846.58 [oz_av]

## 2024-08-15 MED ORDER — BANATROL TF EN LIQD
60.0000 mL | Freq: Three times a day (TID) | ENTERAL | Status: DC
  Administered 2024-08-15 – 2024-08-16 (×3): 60 mL
  Filled 2024-08-15 (×5): qty 60

## 2024-08-15 MED ORDER — ALBUTEROL SULFATE (2.5 MG/3ML) 0.083% IN NEBU
INHALATION_SOLUTION | RESPIRATORY_TRACT | Status: AC
  Filled 2024-08-15: qty 3

## 2024-08-15 MED ORDER — ALPRAZOLAM 0.5 MG PO TABS
1.0000 mg | ORAL_TABLET | Freq: Every day | ORAL | Status: DC | PRN
  Administered 2024-08-16: 1 mg
  Filled 2024-08-15: qty 2

## 2024-08-15 MED ORDER — GUAIFENESIN 100 MG/5ML PO LIQD
5.0000 mL | ORAL | Status: DC | PRN
Start: 2024-08-15 — End: 2024-08-16

## 2024-08-15 MED ORDER — METOPROLOL TARTRATE 5 MG/5ML IV SOLN: 5.0000 mg | INTRAVENOUS | Status: DC | PRN

## 2024-08-15 MED ORDER — HYDROCODONE-ACETAMINOPHEN 5-325 MG PO TABS
1.0000 | ORAL_TABLET | Freq: Four times a day (QID) | ORAL | Status: DC
  Administered 2024-08-15 – 2024-08-16 (×5): 1
  Filled 2024-08-15 (×5): qty 1

## 2024-08-15 MED ORDER — SENNOSIDES-DOCUSATE SODIUM 8.6-50 MG PO TABS
1.0000 | ORAL_TABLET | Freq: Every evening | ORAL | Status: DC | PRN
Start: 2024-08-15 — End: 2024-08-16

## 2024-08-15 MED ORDER — VANCOMYCIN HCL IN DEXTROSE 1-5 GM/200ML-% IV SOLN
1000.0000 mg | Freq: Two times a day (BID) | INTRAVENOUS | Status: DC
  Administered 2024-08-15 – 2024-08-16 (×3): 1000 mg via INTRAVENOUS
  Filled 2024-08-15 (×3): qty 200

## 2024-08-15 MED ORDER — EZETIMIBE 10 MG PO TABS
10.0000 mg | ORAL_TABLET | Freq: Every day | ORAL | Status: DC
  Administered 2024-08-15 – 2024-08-16 (×2): 10 mg via ORAL
  Filled 2024-08-15 (×2): qty 1

## 2024-08-15 MED ORDER — ALLOPURINOL 300 MG PO TABS
300.0000 mg | ORAL_TABLET | Freq: Every day | ORAL | Status: DC
  Administered 2024-08-15 – 2024-08-16 (×2): 300 mg
  Filled 2024-08-15 (×2): qty 1

## 2024-08-15 MED ORDER — ASPIRIN 81 MG PO TBEC
81.0000 mg | DELAYED_RELEASE_TABLET | Freq: Every day | ORAL | Status: DC
  Administered 2024-08-15 – 2024-08-16 (×2): 81 mg via ORAL
  Filled 2024-08-15 (×2): qty 1

## 2024-08-15 MED ORDER — HYDRALAZINE HCL 20 MG/ML IJ SOLN: 10.0000 mg | INTRAMUSCULAR | Status: DC | PRN

## 2024-08-15 MED ORDER — POTASSIUM CHLORIDE 10 MEQ/100ML IV SOLN
10.0000 meq | INTRAVENOUS | Status: AC
  Administered 2024-08-15 (×4): 10 meq via INTRAVENOUS
  Filled 2024-08-15 (×3): qty 100

## 2024-08-15 NOTE — Progress Notes (Signed)
 PHARMACY NOTE:  ANTIMICROBIAL RENAL DOSAGE ADJUSTMENT  Current antimicrobial regimen includes a mismatch between antimicrobial dosage and estimated renal function.  As per policy approved by the Pharmacy & Therapeutics and Medical Executive Committees, the antimicrobial dosage will be adjusted accordingly.  Current antimicrobial dosage:  vancomycin  1750 mg IV q24h  Indication: pneumonia  Renal Function:  Estimated Creatinine Clearance: 81 mL/min (by C-G formula based on SCr of 0.77 mg/dL).     Antimicrobial dosage has been changed to:  vancomycin  1000 mg IV q12h    Thank you for allowing pharmacy to be a part of this patient's care.  Stefano MARLA Bologna, PharmD, BCPS Clinical Pharmacist 08/15/2024 7:19 AM

## 2024-08-15 NOTE — Progress Notes (Signed)
 [facility]  HEMATOLOGY/ONCOLOGY IN-PATIENT PROGRESS NOTE   PATIENT NAME: Gilbert Thompson   MR#: [id number] DOB: Nov 26, 1950 CSN#: [id number]   DATE OF SERVICE: 08/15/2023  ASSESSMENT & PLAN:   WILLAIM MODE is a pleasant 73 y.o. gentleman who is well-known to our service for his primary squamous cell carcinoma of the oropharynx, stage I disease with extranodal extension, received concurrent chemoradiation, last chemotherapy on 07/26/2024, further chemotherapy held because of neutropenia despite G-CSF support.  He was recently hospitalized from 08/07/2024 until 08/12/2024 for sepsis, bilateral aspiration pneumonia.  He presented to radiation oncology appointment earlier today and complained of diarrhea and low-grade fevers at home.  He was found to be febrile with temperature of 100.5 and was directed to the ED for further evaluation and admission.  Diarrhea, fever - Recent antibiotic usage for sepsis/aspiration pneumonia, raised concern for C. difficile.  Testing pending. - He is not neutropenic currently. - Agree with broad-spectrum antibiotic coverage for fever and symptomatic management for diarrhea and tube feeding adjustments  Multifocal pneumonia - Recently treated for multifocal pneumonia.  CT scan shows worsening consolidation.  Continue broad-spectrum antibiotics.  Squamous cell carcinoma of the oropharynx, stage I disease with extranodal extension, treated with concurrent chemoradiation -Last chemotherapy with cisplatin  cycle 5 was on 07/26/2024.  Further chemotherapy was withheld because of neutropenia and he received Zarxio  support in our clinic.   - Currently undergoing radiation therapy. Radiation therapy to be completed tomorrow, 08/15/2024.  This will complete the planned treatments for him.  Oropharyngeal mucositis Persistent sore throat and difficulty swallowing, limiting oral intake to a few sips of water . Condition expected to improve with  discontinuation of chemotherapy. - Continue supportive care for mucositis.  Dysphagia Difficulty swallowing due to oropharyngeal mucositis. Continuous nutrition via pump is beneficial, reducing aspiration risk compared to bolus feeding. - Consulted with dietitian Elvie regarding home use of the nutrition pump.  Currently no indication for G-CSF support.  Transfuse as needed to maintain hemoglobin closer to 8 if he is symptomatic.  Goal platelet count is above 20,000.  He is currently scheduled to see me in clinic on 08/30/2024.  I will plan to see him as scheduled.   Rest of the management is as per primary team.  Please call us  with any questions or concerns.   [name], MD 08/14/2024  ONCOLOGY HISTORY:    He presented to his PCP with complains of chronic pharyngitis and a left submandibular mass that has persisted over the past year.   To further investigate his symptoms, he underwent a CT neck and CT chest on 04/06/24 showing a 3.0 x 2.4 x 2.5 cm soft tissue mass centered in the left oropharynx at the glossotonsillar sulcus with possible involvement of the adjacent tongue base. 7 mm low-attenuation left level IB lymph node likely reflecting a site of nodal metastatic disease. 1.7 x 1.7 cm irregular soft tissue focus at the left level II station, inseparable from the adjacent sternocleidomastoid muscle. This is suspicious for nodal metastatic disease (with findings concerning for extracapsular extension) or a tumor deposit. 9 mm short-axis low attenuation left level III lymph node likely reflecting a site of nodal metastatic disease. Scans also noted scattered tiny pulmonary nodules are unchanged and likely benign but with only 5 months of documented stability but with recommend attention on follow-up given his history of prostate cancer.    Subsequently, the patient was referred to Dr. Graig on 04/24/24 who performed a FNA biopsy of left neck.  However, biopsy was inconclusive. As a  result, he then underwent a laryngoscopy with biopsy on 05/15/24 under the care of Dr. Lauralee. 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 Tumor is moderately differentiated with foci of poor differentiation and invasion into the striated muscle. Immunohistochemical studies are p16, p40, and HRHPV are positive.    He had a cervical spine MRI performed on 05/09/24 revealing no evidence of metastatic disease to the cervical spine itself but did indicated degenerative spondylosis at C4-5 and C5-6. 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 PET scan performed on 04/26/24 showing FDG avid mass along the left tongue base/oropharynx with left cervical chain nodal metastatic disease with no suspicious pulmonary or mediastinal FDG uptake.   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    Prior cancers, if any: history of prostate cancer.   Given extracapsular extension with lymph node positive disease, plan made to proceed with concurrent chemoradiation.   Plan made for weekly cisplatin  with radiation, if renal function allows.  Otherwise we will use either docetaxel or carboplatin/paclitaxel regimen.   Started concurrent chemoradiation with weekly cisplatin  from 06/27/2024.   Creatinine went up to 1.44 on 07/12/2024.  Dose reduced cisplatin  by 50% with cycle 3 as a result.  With normalization of creatinine, cisplatin  was resumed at standard dose from cycle 4 onwards.  Cycle 5 of chemotherapy was given on 07/26/2024.  Further chemotherapy held because of neutropenia.  He was hospitalized from 08/08/2023 until 08/12/2024 for sepsis secondary to bilateral aspiration pneumonia.  Scheduled to complete radiation treatments on 08/15/2024.  SUBJECTIVE:   Patient seen and evaluated.  His wife and daughter were by the bedside.  The diarrhea  was a significant concern, especially with the accompanying fever, which prompted the decision for hospital admission rather than outpatient management.  He is currently undergoing radiation therapy, with the last session scheduled for tomorrow. He has been in communication with a dietitian, Elvie, regarding dietary changes, and there have been discussions about issues with a pump, for which alternatives were suggested. He is also in contact with a case worker to address these concerns.  He has been experiencing weakness in his voice, which he notes has been a recurring issue. He is also dealing with complications from chemotherapy and radiation, including difficulty swallowing and aspiration.  He is concerned about the duration of recovery from chemotherapy and radiation, noting that it has been about two to three weeks since chemotherapy and that radiation effects are still lingering.   OBJECTIVE:  Vitals:   08/15/24 0545 08/15/24 0817  BP: 113/68   Pulse: 92   Resp: 18   Temp: 98 F (36.7 C)   SpO2: 97% 96%     Intake/Output Summary (Last 24 hours) at 08/15/2024 9057 Last data filed at 08/14/2024 1850 Gross per 24 hour  Intake 391.27 ml  Output 400 ml  Net -8.73 ml    Physical Exam Constitutional:      General: He is not in acute distress. HENT:     Head: Normocephalic and atraumatic.  Eyes:     Conjunctiva/sclera: Conjunctivae normal.  Cardiovascular:     Rate and Rhythm: Normal rate.  Pulmonary:     Effort: Pulmonary effort is normal. No respiratory distress.  Abdominal:     General: There is no distension.  Neurological:  Mental Status: He is alert.    LABS:   Results for orders placed or performed during the hospital encounter of 08/14/24 (from the past 24 hours)  CBC with Differential     Status: Abnormal   Collection Time: 08/14/24 11:55 AM  Result Value Ref Range   WBC 5.1 4.0 - 10.5 K/uL   RBC 3.20 (L) 4.22 - 5.81 MIL/uL   Hemoglobin 9.6 (L) 13.0 -  17.0 g/dL   HCT 71.6 (L) 60.9 - 47.9 %   MCV 88.4 80.0 - 100.0 fL   MCH 30.0 26.0 - 34.0 pg   MCHC 33.9 30.0 - 36.0 g/dL   RDW 82.8 (H) 88.4 - 84.4 %   Platelets 133 (L) 150 - 400 K/uL   nRBC 0.0 0.0 - 0.2 %   Neutrophils Relative % 77 %   Neutro Abs 4.0 1.7 - 7.7 K/uL   Lymphocytes Relative 12 %   Lymphs Abs 0.6 (L) 0.7 - 4.0 K/uL   Monocytes Relative 5 %   Monocytes Absolute 0.2 0.1 - 1.0 K/uL   Eosinophils Relative 0 %   Eosinophils Absolute 0.0 0.0 - 0.5 K/uL   Basophils Relative 1 %   Basophils Absolute 0.0 0.0 - 0.1 K/uL   WBC Morphology See Note    RBC Morphology MORPHOLOGY UNREMARKABLE    Smear Review Normal platelet morphology    Immature Granulocytes 5 %   Abs Immature Granulocytes 0.27 (H) 0.00 - 0.07 K/uL  Comprehensive metabolic panel     Status: Abnormal   Collection Time: 08/14/24 11:55 AM  Result Value Ref Range   Sodium 132 (L) 135 - 145 mmol/L   Potassium 3.7 3.5 - 5.1 mmol/L   Chloride 96 (L) 98 - 111 mmol/L   CO2 26 22 - 32 mmol/L   Glucose, Bld 112 (H) 70 - 99 mg/dL   BUN 18 8 - 23 mg/dL   Creatinine, Ser 9.04 0.61 - 1.24 mg/dL   Calcium  9.0 8.9 - 10.3 mg/dL   Total Protein 7.2 6.5 - 8.1 g/dL   Albumin 2.6 (L) 3.5 - 5.0 g/dL   AST 61 (H) 15 - 41 U/L   ALT 69 (H) 0 - 44 U/L   Alkaline Phosphatase 96 38 - 126 U/L   Total Bilirubin 0.4 0.0 - 1.2 mg/dL   GFR, Estimated >39 >39 mL/min   Anion gap 10 5 - 15  Lipase, blood     Status: None   Collection Time: 08/14/24 11:55 AM  Result Value Ref Range   Lipase 11 11 - 51 U/L  Pro Brain natriuretic peptide     Status: Abnormal   Collection Time: 08/14/24 11:55 AM  Result Value Ref Range   Pro Brain Natriuretic Peptide 677.0 (H) <300.0 pg/mL  Troponin T, High Sensitivity     Status: Abnormal   Collection Time: 08/14/24 11:55 AM  Result Value Ref Range   Troponin T High Sensitivity 34 (H) 0 - 19 ng/L  Magnesium      Status: Abnormal   Collection Time: 08/14/24  2:00 PM  Result Value Ref Range    Magnesium  1.5 (L) 1.7 - 2.4 mg/dL  Troponin T, High Sensitivity     Status: Abnormal   Collection Time: 08/14/24  2:00 PM  Result Value Ref Range   Troponin T High Sensitivity 31 (H) 0 - 19 ng/L  Urinalysis, Routine w reflex microscopic -Urine, Clean Catch     Status: Abnormal   Collection Time: 08/14/24  2:55 PM  Result Value Ref Range   Color, Urine YELLOW YELLOW   APPearance CLEAR CLEAR   Specific Gravity, Urine 1.012 1.005 - 1.030   pH 8.0 5.0 - 8.0   Glucose, UA NEGATIVE NEGATIVE mg/dL   Hgb urine dipstick NEGATIVE NEGATIVE   Bilirubin Urine NEGATIVE NEGATIVE   Ketones, ur NEGATIVE NEGATIVE mg/dL   Protein, ur 899 (A) NEGATIVE mg/dL   Nitrite NEGATIVE NEGATIVE   Leukocytes,Ua NEGATIVE NEGATIVE   RBC / HPF 0-5 0 - 5 RBC/hpf   WBC, UA 0-5 0 - 5 WBC/hpf   Bacteria, UA NONE SEEN NONE SEEN   Squamous Epithelial / HPF 0-5 0 - 5 /HPF   Mucus PRESENT    Hyaline Casts, UA PRESENT   Basic metabolic panel     Status: Abnormal   Collection Time: 08/15/24  4:00 AM  Result Value Ref Range   Sodium 133 (L) 135 - 145 mmol/L   Potassium 3.4 (L) 3.5 - 5.1 mmol/L   Chloride 99 98 - 111 mmol/L   CO2 27 22 - 32 mmol/L   Glucose, Bld 164 (H) 70 - 99 mg/dL   BUN 16 8 - 23 mg/dL   Creatinine, Ser 9.22 0.61 - 1.24 mg/dL   Calcium  8.4 (L) 8.9 - 10.3 mg/dL   GFR, Estimated >39 >39 mL/min   Anion gap 8 5 - 15  CBC     Status: Abnormal   Collection Time: 08/15/24  4:00 AM  Result Value Ref Range   WBC 3.2 (L) 4.0 - 10.5 K/uL   RBC 2.36 (L) 4.22 - 5.81 MIL/uL   Hemoglobin 7.1 (L) 13.0 - 17.0 g/dL   HCT 78.7 (L) 60.9 - 47.9 %   MCV 89.8 80.0 - 100.0 fL   MCH 30.1 26.0 - 34.0 pg   MCHC 33.5 30.0 - 36.0 g/dL   RDW 82.7 (H) 88.4 - 84.4 %   Platelets 112 (L) 150 - 400 K/uL   nRBC 0.0 0.0 - 0.2 %     IMAGING STUDIES:   UE VENOUS DUPLEX (7am - 7pm) Result Date: 08/14/2024 UPPER VENOUS STUDY  Patient Name:  ROSALIO CATTERTON  Date of Exam:   08/14/2024 Medical Rec #: [id number]        Accession  #:    [id number] Date of Birth: 02-Sep-1951        Patient Gender: M Patient Age:   36 years Exam Location:  [facility] Procedure:      VAS US  UPPER EXTREMITY VENOUS DUPLEX Referring Phys: ADAM CURATOLO --------------------------------------------------------------------------------  Indications: Swelling Risk Factors: Primary squamous cell carcinoma of oropharynx, chemotherapy/radiation. Limitations: Body habitus and poor ultrasound/tissue interface. Comparison Study: No previous exams Performing Technologist: Alberta Lis RVS  Examination Guidelines: A complete evaluation includes B-mode imaging, spectral Doppler, color Doppler, and power Doppler as needed of all accessible portions of each vessel. Bilateral testing is considered an integral part of a complete examination. Limited examinations for reoccurring indications may be performed as noted.  Right Findings: +----------+------------+---------+-----------+----------+-----------------+ RIGHT     CompressiblePhasicitySpontaneousProperties     Summary      +----------+------------+---------+-----------+----------+-----------------+ Subclavian               Yes       Yes              patent by doppler +----------+------------+---------+-----------+----------+-----------------+  Left Findings: +----------+------------+---------+-----------+----------+--------------------+ LEFT      CompressiblePhasicitySpontaneousProperties      Summary        +----------+------------+---------+-----------+----------+--------------------+ IJV  Yes       Yes                   patent by                                                              color/doppler     +----------+------------+---------+-----------+----------+--------------------+ Subclavian               Yes       Yes                   patent by                                                              color/doppler      +----------+------------+---------+-----------+----------+--------------------+ Axillary                 Yes       Yes                   patent by                                                              color/doppler     +----------+------------+---------+-----------+----------+--------------------+ Brachial      Full       Yes       Yes                                   +----------+------------+---------+-----------+----------+--------------------+ Radial        Full                                                       +----------+------------+---------+-----------+----------+--------------------+ Ulnar         Full                                                       +----------+------------+---------+-----------+----------+--------------------+ Cephalic      Full                                                       +----------+------------+---------+-----------+----------+--------------------+ Basilic  Not visualized    +----------+------------+---------+-----------+----------+--------------------+ enlarged lymph nodes seen in neck (left side).  Summary:  Right: No evidence of thrombosis in the subclavian.  Left: No evidence of deep vein thrombosis in the upper extremity. No evidence of superficial vein thrombosis in the upper extremity. However, unable to visualize the basilic vein. Subcutaneous edema throughout upper extremity.  *See table(s) above for measurements and observations.  Diagnosing physician: Lonni Gaskins MD Electronically signed by Lonni Gaskins MD on 08/14/2024 at 4:59:56 PM.    Final    CT Angio Chest PE W and/or Wo Contrast Result Date: 08/14/2024 CLINICAL DATA:  Weakness, fever, diarrhea, head and neck cancer EXAM: CT ANGIOGRAPHY CHEST WITH CONTRAST TECHNIQUE: Multidetector CT imaging of the chest was performed using the standard protocol during bolus administration of  intravenous contrast. Multiplanar CT image reconstructions and MIPs were obtained to evaluate the vascular anatomy. RADIATION DOSE REDUCTION: This exam was performed according to the departmental dose-optimization program which includes automated exposure control, adjustment of the mA and/or kV according to patient size and/or use of iterative reconstruction technique. CONTRAST:  75mL OMNIPAQUE  IOHEXOL  350 MG/ML SOLN COMPARISON:  08/07/2024 FINDINGS: Cardiovascular: This is a technically adequate evaluation of the pulmonary vasculature. No filling defects or pulmonary emboli. The heart is unremarkable without pericardial effusion. No evidence of thoracic aortic aneurysm or dissection. Atherosclerosis of the aorta and coronary vasculature. Right chest wall port via internal jugular approach tip within the superior vena cava. Mediastinum/Nodes: Right hilar adenopathy measuring up to 1.3 cm in short axis. No other pathologic adenopathy within the mediastinum or axillary regions. Thyroid , trachea, and esophagus are unremarkable. Lungs/Pleura: Since the previous exam, there has been progression of the multifocal bilateral airspace disease, greatest in the right upper lobe. Interval development of trace bilateral pleural effusions. No pneumothorax. The central airways are patent. Upper Abdomen: Percutaneous gastrostomy tube within the lumen of the gastric antrum. No acute upper abdominal findings. Musculoskeletal: No acute or destructive bony abnormalities. Reconstructed images demonstrate no additional findings. Review of the MIP images confirms the above findings. IMPRESSION: 1. No evidence of pulmonary embolus. 2. Progressive multifocal bilateral airspace disease, greatest in the right upper lobe, consistent with bilateral bronchopneumonia. 3. Trace bilateral parapneumonic effusions. 4. Stable right hilar lymphadenopathy. 5. Aortic Atherosclerosis (ICD10-I70.0). Coronary artery atherosclerosis. The Electronically  Signed   By: Ozell Daring M.D.   On: 08/14/2024 16:08   DG Chest Portable 1 View Result Date: 08/14/2024 CLINICAL DATA:  Shortness of breath. EXAM: PORTABLE CHEST 1 VIEW COMPARISON:  Chest radiograph dated 08/07/2024. FINDINGS: Right-sided Port-A-Cath with tip at the cavoatrial junction. Right mid to lower lung field hazy density may represent atelectasis or pneumonia. No pleural effusion pneumothorax. Stable cardiac silhouette. No acute osseous pathology. IMPRESSION: Right mid to lower lung field atelectasis or pneumonia. Electronically Signed   By: Vanetta Chou M.D.   On: 08/14/2024 13:10   US  Abdomen Limited RUQ (LIVER/GB) Result Date: 08/11/2024 CLINICAL DATA:  Transaminitis EXAM: ULTRASOUND ABDOMEN LIMITED RIGHT UPPER QUADRANT COMPARISON:  PET-CT February 22, 2023. FINDINGS: Gallbladder: No gallstones or wall thickening visualized. No sonographic Murphy sign noted by sonographer. Common bile duct: Diameter: 4 mm Liver: No focal lesion identified. Normal parenchymal echogenicity. Portal vein is patent on color Doppler imaging with normal direction of blood flow towards the liver. Other: Incidental note was made of right-sided pleural effusion. IMPRESSION: No focal liver lesion. Right small pleural effusion. Electronically Signed   By: Megan  Zare M.D.   On: 08/11/2024 14:45   DG  Swallowing Func-Speech Pathology Result Date: 08/09/2024 Table formatting from the original result was not included. Modified Barium Swallow Study Patient Details Name: ALSON MCPHEETERS MRN: [id number] Date of Birth: 1951-08-22 Today's Date: 08/09/2024 HPI/PMH: HPI: pt is a 73 yo male adm to [facility] with hypotension, poor intake.  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 He is undergoing concurrent chemoradiation. Last  chemotherapy with cisplatin  cycle 5 was on 07/26/2024. Further chemotherapy was withheld because of neutropenia and has been receiving Zarxio  support in our clinic. Pt received a PEG tube for nutrition and reports having consumed po and using feeding tube for nutrition. 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  He admits odynophagia and dysphagia are equal parts responsible for his poor intake. Pt CXR 08/07/2024: Patchy airspace opacities, especially in the lower lobes, but also in the right upper lobe and right middle lobe potentially from atypical or multilobar pneumonia. Clinical Impression: Clinical Impression: Patient presents with mild oropharyngeal dysphagia likely due to his oropharyngeal mass, iatrogenic effects of XRT and his bilateral vocal fold atrophy.  These combined issues with edema result in decreased airway closure allowing trace aspiration of thin liquid with nonproductive cough response.  Aspiration noted most especially occuring early in the study - as barium spilled into open airway prior to swallow being triggered.  SLP questions some component of warm up effect. Further episodes of laryngeal penetration were inconsistent and only trace.  Mild retention noted in pharynx (more at tongue base with solids) post-swallow due to decreased tongue base retraction and inadequate mastication. Pt c/o severe xerostomia causing foods to adhere to his oral structures  No aspiration or penetration with cracker, puree or honey thick liquids.   Pharyngeal clearance with puree was excellent, likely due to combined weight of bolus allowing improved neuro input.  Various postures were not helpful to mitigate retention or aspiration - including chin tuck *tucked approx 20* due to neck stiffness* and head turn left.  Liquid swallows are more effective than dry swallows which are not really functional.  At this time, recommend pt be able to  start po diet with strict precautions.  If pt is reflexively coughing during intake, it is likely due to aspiration.  Given he has PEG tube for nutrition, recommend close monitoring of po diet via temperatures, lung sound monitoring, etc. Expect swallow to improve several week after XRT is completed. Factors that may increase risk of adverse event in presence of aspiration Noe & Lianne 2021): Factors that may increase risk of adverse event in presence of aspiration Noe & Lianne 2021): Reduced saliva; Poor general health and/or compromised immunity Recommendations/Plan: Swallowing Evaluation Recommendations Swallowing Evaluation Recommendations Recommendations: Dysphagia 1 (Pureed);(EXTRA GRAVY/SAUCES - CREAMY PUREES!) Thin liquids (Level 0 )=  CONSIDER WATER  WITH MEALS Liquid Administration via: Cup; Straw Medication Administration: Crushed with puree (or via PEG) Swallowing strategies : Start intake with WATER ; Small bites/sips. Follow solid w/liquid. Stop intake if overtly coughing. Clear throat intermittently Treatment Plan Treatment Plan Treatment recommendations: Therapy as outlined in treatment plan below Follow-up recommendations: Follow physicians's recommendations for discharge plan and follow up therapies Functional status assessment: Patient has had a recent decline in their functional status and demonstrates the ability to make significant improvements in function in  a reasonable and predictable amount of time. Treatment frequency: Min 1x/week Treatment duration: 1 week Interventions: Aspiration precaution training; Patient/family education; Oropharyngeal exercises; Respiratory muscle strength training Recommendations Recommendations for follow up therapy are one component of a multi-disciplinary discharge planning process, led by the attending physician.  Recommendations may be updated based on patient status, additional functional criteria and insurance authorization. Assessment: Orofacial  Exam: Orofacial Exam Oral Cavity: Oral Hygiene: Xerostomia; Lingual coating; Edema; Erythema Oral Cavity - Dentition: Adequate natural dentition Orofacial Anatomy: Other (comment) (pt has known left tonsillar mass close to uvula and down to glossotonsillar sulcus) Anatomy: Anatomy: Suspected cervical osteophytes Boluses Administered: Boluses Administered Boluses Administered: Thin liquids (Level 0); Mildly thick liquids (Level 2, nectar thick); Puree; Moderately thick liquids (Level 3, honey thick); Solid  Oral Impairment Domain: Oral Impairment Domain Lip Closure: No labial escape Tongue control during bolus hold: Cohesive bolus between tongue to palatal seal Bolus preparation/mastication: Minimal chewing/mashing with majority of bolus unchewed Bolus transport/lingual motion: Slow tongue motion Oral residue: Trace residue lining oral structures Location of oral residue : Palate; Tongue Initiation of pharyngeal swallow : Posterior laryngeal surface of the epiglottis; Pyriform sinuses; Valleculae  Pharyngeal Impairment Domain: Pharyngeal Impairment Domain Soft palate elevation: No bolus between soft palate (SP)/pharyngeal wall (PW) Laryngeal elevation: Partial superior movement of thyroid  cartilage/partial approximation of arytenoids to epiglottic petiole Anterior hyoid excursion: Partial anterior movement Epiglottic movement: Partial inversion Laryngeal vestibule closure: Incomplete, narrow column air/contrast in laryngeal vestibule Pharyngeal stripping wave : Present - diminished Pharyngeal contraction (A/P view only): N/A Pharyngoesophageal segment opening: Partial distention/partial duration, partial obstruction of flow Tongue base retraction: Narrow column of contrast or air between tongue base and PPW Pharyngeal residue: Collection of residue within or on pharyngeal structures; Trace residue within or on pharyngeal structures Location of pharyngeal residue: Tongue base; Valleculae (tongue base and vallecular  more than pharyngeal wall and pyriform sinus)  Esophageal Impairment Domain: Esophageal Impairment Domain Esophageal clearance upright position: Esophageal retention Pill: Pill Consistency administered: -- (DNT) Penetration/Aspiration Scale Score: Penetration/Aspiration Scale Score 1.  Material does not enter airway: Moderately thick liquids (Level 3, honey thick); Puree; Solid 2.  Material enters airway, remains ABOVE vocal cords then ejected out: Mildly thick liquids (Level 2, nectar thick) 7.  Material enters airway, passes BELOW cords and not ejected out despite cough attempt by patient: Thin liquids (Level 0) Compensatory Strategies: Compensatory Strategies Compensatory strategies: Yes Chin tuck: Ineffective Ineffective Chin Tuck: Thin liquid (Level 0) Liquid wash: Effective Left head turn: Ineffective Ineffective Left Head Turn: Mildly thick liquid (Level 2, nectar thick)   General Information: Caregiver present: Yes  Diet Prior to this Study: NPO; Other (Comment) (ice chips)   Temperature : Normal   Respiratory Status: WFL   Supplemental O2: None (Room air)   History of Recent Intubation: No  Behavior/Cognition: Alert; Cooperative; Pleasant mood Self-Feeding Abilities: Able to self-feed Baseline vocal quality/speech: Dysphonic Volitional Cough: Able to elicit Volitional Swallow: Able to elicit Exam Limitations: No limitations Goal Planning: Prognosis for improved oropharyngeal function: Good No data recorded No data recorded Patient/Family Stated Goal: He admits odynophagia and dysphagia are equal parts responsible for his poor intake. No data recorded Pain: Pain Assessment Pain Assessment: No/denies pain Faces Pain Scale: 2 Pain Location: oropharynx Pain Descriptors / Indicators: Aching; Burning Pain Intervention(s): Limited activity within patient's tolerance; Other (comment) (RN provided medication during session) End of Session: Start Time:SLP Start Time (ACUTE ONLY): 1210 Stop Time: SLP Stop Time (ACUTE  ONLY): 1235 Time Calculation:SLP  Time Calculation (min) (ACUTE ONLY): 25 min Charges: SLP Evaluations $ SLP Speech Visit: 1 Visit SLP Evaluations $BSS Swallow: 1 Procedure $MBS Swallow: 1 Procedure $Swallowing Treatment: 1 Procedure SLP visit diagnosis: SLP Visit Diagnosis: Dysphagia, oropharyngeal phase (R13.12) Past Medical History: Past Medical History: Diagnosis Date  Asthma   Cancer (HCC)   prostate cancer  Coronary artery disease   pt states they were unable to place a stent at that time  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, follwed by [facility]  Dyspnea   with exertion  GERD (gastroesophageal reflux disease)   Hyperlipidemia   Hypertension   Pneumonia  Past Surgical History: Past Surgical History: Procedure Laterality Date  CATARACT EXTRACTION Bilateral   IR GASTROSTOMY TUBE MOD SED  06/23/2024  IR IMAGING GUIDED PORT INSERTION  06/23/2024  LEFT HEART CATH AND CORONARY ANGIOGRAPHY N/A 06/05/2022  Procedure: LEFT HEART CATH AND CORONARY ANGIOGRAPHY;  Surgeon: Claudene Victory ORN, MD;  Location: [facility];  Service: Cardiovascular;  Laterality: N/A;  LYMPHADENECTOMY Bilateral 04/29/2023  Procedure: BILATERAL PELVIC LYMPHADENECTOMY;  Surgeon: Renda Glance, MD;  Location: [facility];  Service: Urology;  Laterality: Bilateral;  PROSTATE BIOPSY    ROBOT ASSISTED LAPAROSCOPIC RADICAL PROSTATECTOMY N/A 04/29/2023  Procedure: XI ROBOTIC ASSISTED LAPAROSCOPIC RADICAL PROSTATECTOMY LEVEL 2;  Surgeon: Renda Glance, MD;  Location: [facility];  Service: Urology;  Laterality: N/A;  210 MINUTES NEEDED FOR CASE  UPPER GASTROINTESTINAL ENDOSCOPY    VENTRAL HERNIA REPAIR N/A 10/05/2023  Procedure: LAPAROSCOPIC VENTRAL HERNIA REPAAIR WITH MESH;  Surgeon: Rubin Calamity, MD;  Location[facility];  Service: General;  Laterality: N/A; [name], MS [facility] SLP Acute Rehab Services Office [phone] [name] 08/09/2024, 2:55 PM  CT Angio Chest PE W/Cm &/Or Wo Cm Result Date: 08/07/2024 EXAM: CTA CHEST 08/07/2024 01:30:55  PM TECHNIQUE: CTA of the chest was performed after the administration of intravenous contrast. Multiplanar reformatted images are provided for review. MIP images are provided for review. Automated exposure control, iterative reconstruction, and/or weight based adjustment of the mA/kV was utilized to reduce the radiation dose to as low as reasonably achievable. COMPARISON: None available. 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 * Tracking Code: BO * right port a cath tip colon SVC. FINDINGS: PULMONARY ARTERIES: Pulmonary arteries are adequately opacified for evaluation. No acute pulmonary embolus. Main pulmonary artery is normal in caliber. MEDIASTINUM: Mild cardiomegaly. Thoracic aortic, coronary artery, and branch vessel atheromatous vascular calcifications. There is no acute abnormality of the thoracic aorta. LYMPH NODES: Right high lung node 1.3 cm in short axis on image 62 series 5, formerly 0.7 cm. No mediastinal or axillary lymphadenopathy. LUNGS AND PLEURA: Bilateral airway thickening noted. Patchy and nodular regions of airspace opacity in the right upper lobe, right middle lobe, and confluent in both lower lobes especially dependently raising the possibility of multilobar pneumonia. There is also some faint ground glass opacity posteriorly in the lingula. No evidence of pleural effusion or pneumothorax. UPPER ABDOMEN: Limited images of the upper abdomen are unremarkable. Peg tube noted. SOFT TISSUES AND BONES: Thoracic spondylosis. No acute bone or soft tissue abnormality. IMPRESSION: 1. No pulmonary embolism. 2. Patchy airspace opacities, especially in the lower lobes, but also in the right upper lobe and right middle lobe potentially from atypical or multilobar pneumonia. 3. Enlarged right high lung lymph node measuring 1.3 cm short axis, increased from 0.7 cm. This is likely reactive  given the findings in the lung, although surveillance is suggested. 4. Mild cardiomegaly. 5. Thoracic aortic and coronary artery atherosclerosis. 6. Bilateral airway thickening. 7. Thoracic spondylosis. Electronically signed by: Ryan Salvage MD 08/07/2024 02:26 PM EST RP Workstation: [id number]   DG Chest Port 1 View Result Date: 08/07/2024 CLINICAL DATA:  Questionable sepsis. Head and neck cancer, hypotension with decreased O2 sats. EXAM: PORTABLE CHEST 1 VIEW COMPARISON:  09/28/2022.  CT chest 04/06/2024. FINDINGS: Right IJ Port-A-Cath tip is at the SVC RA junction. Lungs are clear. No pleural fluid. IMPRESSION: No acute findings. Electronically Signed   By: Newell Eke M.D.   On: 08/07/2024 10:59

## 2024-08-15 NOTE — Hospital Course (Addendum)
 Brief Narrative:   73 year old with history of asthma, prostate cancer, CAD, GERD, carcinoma of oropharynx completed chemo and now on radiation and recently hospitalized for sepsis secondary to pneumonia coming to the hospital for diarrhea, weakness and lower extremity edema from the cancer center.  Upon admission started on broad-spectrum antibiotic.  Suspect his diarrhea is secondary to tube feeds but given his history, C. difficile and GI panel has been sent.  Assessment & Plan:   Left  Upper extremity edema -No DVT seen on Dopplers.    Multifocal pneumonia -Recently hospitalized for pneumonia.  Most of the workup was negative and eventually his broad-spectrum antibiotics were transitioned to p.o. Augmentin  for 7 more days and was discharged with bronchodilators. Currently on Vanc/Cefepime .  History of CAD/hyperlipidemia - On aspirin  and Zetia .  Statin on hold  Anemia of chronic disease - Baseline hemoglobin around 8.0, this morning 7.1.  Likely from chemoradiation.  Will discuss 1 unit PRBC transfusion   Primary squamous cell carcinoma of the oropharynx -Completed chemotherapy.  Final radiation planned on 11/25.  Pain control, bowel regimen.    Hypokalemia - As needed repletion   Diarrhea Chronic dysphagia Severe protein calorie malnutrition -Possibly related to antibiotics and tube feeds.  Closely monitor this.  No abdominal pain.  C. difficile and GI panel has been sent -On tube feeds via peg tube along with dysphagia 1 diet   Transaminitis -noted on last hospitalization, resolving.  Continue to avoid hepatotoxins as able.   Grade 1 diastolic dysfunction -Echocardiogram 2023 showed EF of 60% with grade 1 DD.  Upon admission repeat echo has been ordered.  Since     DVT prophylaxis: Lovenox     Code Status: Full Code Family Communication:   Status is: Inpatient Remains inpatient appropriate because: Ongoing management for multifocal pneumonia, weakness and  anemia   PT Follow up Recs:   Subjective: Doing ok Overall feels a little better.  Tells me he started having diarrhea basically after starting his tube feeds and bolus feeding.   Examination:  General exam: Appears calm and comfortable  Respiratory system: Clear to auscultation. Respiratory effort normal. Cardiovascular system: S1 & S2 heard, RRR. No JVD, murmurs, rubs, gallops or clicks. No pedal edema. Gastrointestinal system: Abdomen is nondistended, soft and nontender. No organomegaly or masses felt. Normal bowel sounds heard. Central nervous system: Alert and oriented. No focal neurological deficits. Extremities: Symmetric 5 x 5 power. Skin: No rashes, lesions or ulcers Psychiatry: Judgement and insight appear normal. Mood & affect appropriate.

## 2024-08-15 NOTE — Progress Notes (Signed)
 Gilbert Thompson   DOB:14-May-1951   FM#:[id number]      ASSESSMENT & PLAN:  Dang Mathison. [name] is a 73 year old male patient admitted from home on 08/14/24 with complaints of diarrhea, weakness, and edema.  Oncologic history significant for oropharyngeal cancer. Medical Oncology following closely.  Diarrhea/C-dif+ Fever  -- Patient complains of persistent watery diarrhea multiple times per day with fecal incontinence -- C-dif antigen positive 08/15/24 -- Antibiotics per medicine  -- Fever resolved.  -- Continue Enteric precautions  Primary squamous cell carcinoma of oropharynx,  cT2,cN1,cM0,p16+, Stage 1  - Diagnosed 04/2024. Invasive squamous cell carcinoma.  - Last chemotherapy with cisplatin  cycle 5 was on 07/26/2024.  - On RT, final planned 11/25.   -- Medical oncology/Dr. Pasam following  Oropharyngeal mucositis Dysphagia -- On tube feeds, continue as ordered.   -- Seen by Dietitician  Radiation-induced erythema/dermatitis -- To neck/cervical area, improving since last admission -- Patient and wife requesting cream previously given by Rad Onc. Given to them today. -- Continue to monitor  Pancytopenia: -- Likely chemotherapy-induced Leukopenia - WBC low 3.2    - Previously received G-CSF support, not needed at this time. Anemia - Hemoglobin low 7.1 - Recommend PRBC transfusion for hemoglobin <7.0 Thrombocytopenia - Platelets low but stable 112 - No transfusional support required at this time. Recommend transfusion for platelets <20 K or <50 K with bleeding -Continue to monitor CBC with differential closely  Generalized weakness -- Secondary to recent oncologic therapy -- Continue supportive care  History of prostate adenocarcinoma - Diagnosed 04/29/2023 - Status post radical prostatectomy    Code Status Full   Subjective:  Patient seen awake and alert laying in bed. Wife at bedside.  G-tube intact with ongoing feedings.  Patient reports persistent watery diarrhea  with incontinence especially at night that leaks out without him being aware of it.  States fever has gone.  No other complaints offered.   Objective:   Intake/Output Summary (Last 24 hours) at 08/15/2024 1023 Last data filed at 08/15/2024 9041 Gross per 24 hour  Intake 491.27 ml  Output 400 ml  Net 91.27 ml     PHYSICAL EXAMINATION: ECOG PERFORMANCE STATUS: 3 - Symptomatic, >50% confined to bed  Vitals:   08/15/24 0545 08/15/24 0817  BP: 113/68   Pulse: 92   Resp: 18   Temp: 98 F (36.7 C)   SpO2: 97% 96%   Filed Weights   08/14/24 1147 08/14/24 1224  Weight: 178 lb (80.7 kg) 177 lb 14.6 oz (80.7 kg)    GENERAL: alert, no distress and comfortable +ill-appearing SKIN: +erythema cervical area  EYES: normal, conjunctiva are pink and non-injected, sclera clear OROPHARYNX: no exudate, no erythema and lips, buccal mucosa, and tongue normal  NECK: supple, thyroid  normal size, non-tender, without nodularity LYMPH: no palpable lymphadenopathy in the cervical, axillary or inguinal LUNGS: clear to auscultation and percussion with normal breathing effort HEART: regular rate & rhythm and no murmurs and no lower extremity edema ABDOMEN: +G-tube intact MUSCULOSKELETAL: no cyanosis of digits and no clubbing  PSYCH: alert & oriented x 3 with fluent speech NEURO: no focal motor/sensory deficits   All questions were answered. The patient knows to call the clinic with any problems, questions or concerns.   The total time spent in the appointment was 40 minutes encounter with patient including review of chart and various tests results, discussions about plan of care and coordination of care plan  Olam JINNY Brunner, NP 08/15/2024 10:23 AM  Labs Reviewed:  Lab Results  Component Value Date   WBC 3.2 (L) 08/15/2024   HGB 7.1 (L) 08/15/2024   HCT 21.2 (L) 08/15/2024   MCV 89.8 08/15/2024   PLT 112 (L) 08/15/2024   Recent Labs    08/11/24 0930 08/12/24 0444 08/14/24 1155  08/15/24 0400  NA  --  132* 132* 133*  K  --  3.3* 3.7 3.4*  CL  --  96* 96* 99  CO2  --  25 26 27   GLUCOSE  --  115* 112* 164*  BUN  --  18 18 16   CREATININE  --  0.84 0.95 0.77  CALCIUM   --  8.4* 9.0 8.4*  GFRNONAA  --  >60 >60 >60  PROT 5.8* 6.3* 7.2  --   ALBUMIN 2.4* 2.6* 2.6*  --   AST 67* 72* 61*  --   ALT 90* 88* 69*  --   ALKPHOS 67 84 96  --   BILITOT 0.5 0.4 0.4  --   BILIDIR 0.2  --   --   --   IBILI 0.2*  --   --   --     Studies Reviewed:  ECHOCARDIOGRAM COMPLETE Result Date: 08/15/2024    ECHOCARDIOGRAM REPORT   Patient Name:   Gilbert Thompson Date of Exam: 08/15/2024 Medical Rec #:  [id number]       Height:       69.0 in Accession #:    [id number]      Weight:       177.9 lb Date of Birth:  12/20/1950       BSA:          1.966 m Patient Age:    73 years        BP:           113/68 mmHg Patient Gender: M               HR:           93 bpm. Exam Location:  Inpatient Procedure: 2D Echo, 3D Echo, Color Doppler, Cardiac Doppler and Strain Analysis            (Both Spectral and Color Flow Doppler were utilized during            procedure). Indications:    CHF- Acute Diastolic I50.31  History:        Patient has prior history of Echocardiogram examinations, most                 recent 05/04/2022. CAD, hx of cancer, Signs/Symptoms:Shortness of                 Breath and Dyspnea; Risk Factors:Hypertension and Dyslipidemia.  Sonographer:    Koleen Popper RDCS Referring Phys: [id number] MIR M [facility]  Sonographer Comments: No subcostal window. Global longitudinal strain was attempted. IMPRESSIONS  1. Left ventricular ejection fraction, by estimation, is 60 to 65%. The left ventricle has normal function. The left ventricle has no regional wall motion abnormalities. There is mild concentric left ventricular hypertrophy. Indeterminate diastolic filling due to E-A fusion.  2. Right ventricular systolic function is normal. The right ventricular size is normal.  3. The mitral valve is grossly  normal. No evidence of mitral valve regurgitation. No evidence of mitral stenosis.  4. The aortic valve is tricuspid. There is mild calcification of the aortic valve. Aortic valve regurgitation is not visualized. Aortic valve sclerosis is present, with no evidence of aortic valve  stenosis. FINDINGS  Left Ventricle: Left ventricular ejection fraction, by estimation, is 60 to 65%. The left ventricle has normal function. The left ventricle has no regional wall motion abnormalities. Global longitudinal strain performed but not reported based on interpreter judgement due to suboptimal tracking. 3D ejection fraction reviewed and evaluated as part of the interpretation. Alternate measurement of EF is felt to be most reflective of LV function. The left ventricular internal cavity size was normal in  size. There is mild concentric left ventricular hypertrophy. Indeterminate diastolic filling due to E-A fusion. Right Ventricle: The right ventricular size is normal. No increase in right ventricular wall thickness. Right ventricular systolic function is normal. Left Atrium: Left atrial size was normal in size. Right Atrium: Right atrial size was normal in size. Pericardium: Trivial pericardial effusion is present. Presence of epicardial fat layer. Mitral Valve: The mitral valve is grossly normal. No evidence of mitral valve regurgitation. No evidence of mitral valve stenosis. Tricuspid Valve: The tricuspid valve is grossly normal. Tricuspid valve regurgitation is trivial. No evidence of tricuspid stenosis. Aortic Valve: The aortic valve is tricuspid. There is mild calcification of the aortic valve. Aortic valve regurgitation is not visualized. Aortic valve sclerosis is present, with no evidence of aortic valve stenosis. Pulmonic Valve: The pulmonic valve was grossly normal. Pulmonic valve regurgitation is not visualized. No evidence of pulmonic stenosis. Aorta: The aortic root and ascending aorta are structurally normal, with  no evidence of dilitation. Venous: The inferior vena cava was not well visualized. IAS/Shunts: The atrial septum is grossly normal. Additional Comments: 3D was performed not requiring image post processing on an independent workstation and was indeterminate.  LEFT VENTRICLE PLAX 2D LVIDd:         4.30 cm      Diastology LVIDs:         2.70 cm      LV e' medial:    7.07 cm/s LV PW:         1.10 cm      LV E/e' medial:  12.6 LV IVS:        1.20 cm      LV e' lateral:   11.00 cm/s LVOT diam:     2.20 cm      LV E/e' lateral: 8.1 LV SV:         69 LV SV Index:   35 LVOT Area:     3.80 cm                              3D Volume EF: LV Volumes (MOD)            3D EF:        60 % LV vol d, MOD A2C: 110.0 ml LV EDV:       155 ml LV vol s, MOD A2C: 41.2 ml  LV ESV:       62 ml LV SV MOD A2C:     68.8 ml  LV SV:        93 ml RIGHT VENTRICLE RV Basal diam:  3.90 cm RV S prime:     15.30 cm/s TAPSE (M-mode): 2.0 cm LEFT ATRIUM             Index        RIGHT ATRIUM           Index LA diam:        4.00 cm 2.03 cm/m  RA Area:     13.00 cm LA Vol (A2C):   38.8 ml 19.74 ml/m  RA Volume:   30.10 ml  15.31 ml/m LA Vol (A4C):   36.5 ml 18.57 ml/m LA Biplane Vol: 37.8 ml 19.23 ml/m  AORTIC VALVE LVOT Vmax:   101.00 cm/s LVOT Vmean:  67.900 cm/s LVOT VTI:    0.181 m  AORTA Ao Root diam: 3.60 cm Ao Asc diam:  3.80 cm MITRAL VALVE                TRICUSPID VALVE MV Area (PHT): 3.65 cm     TR Peak grad:   15.5 mmHg MV Decel Time: 208 msec     TR Vmax:        197.00 cm/s MV E velocity: 88.90 cm/s MV A velocity: 132.00 cm/s  SHUNTS MV E/A ratio:  0.67         Systemic VTI:  0.18 m                             Systemic Diam: 2.20 cm Darryle Decent MD Electronically signed by Darryle Decent MD Signature Date/Time: 08/15/2024/9:48:46 AM    Final    UE VENOUS DUPLEX (7am - 7pm) Result Date: 08/14/2024 UPPER VENOUS STUDY  Patient Name:  MARQUICE UDDIN  Date of Exam:   08/14/2024 Medical Rec #: [id number]        Accession #:    [id number]  Date of Birth: 10-24-1950        Patient Gender: M Patient Age:   83 years Exam Location:  [facility] Procedure:      VAS US  UPPER EXTREMITY VENOUS DUPLEX Referring Phys: ADAM CURATOLO --------------------------------------------------------------------------------  Indications: Swelling Risk Factors: Primary squamous cell carcinoma of oropharynx, chemotherapy/radiation. Limitations: Body habitus and poor ultrasound/tissue interface. Comparison Study: No previous exams Performing Technologist: Alberta Lis RVS  Examination Guidelines: A complete evaluation includes B-mode imaging, spectral Doppler, color Doppler, and power Doppler as needed of all accessible portions of each vessel. Bilateral testing is considered an integral part of a complete examination. Limited examinations for reoccurring indications may be performed as noted.  Right Findings: +----------+------------+---------+-----------+----------+-----------------+ RIGHT     CompressiblePhasicitySpontaneousProperties     Summary      +----------+------------+---------+-----------+----------+-----------------+ Subclavian               Yes       Yes              patent by doppler +----------+------------+---------+-----------+----------+-----------------+  Left Findings: +----------+------------+---------+-----------+----------+--------------------+ LEFT      CompressiblePhasicitySpontaneousProperties      Summary        +----------+------------+---------+-----------+----------+--------------------+ IJV                      Yes       Yes                   patent by                                                              color/doppler     +----------+------------+---------+-----------+----------+--------------------+ Subclavian               Yes  Yes                   patent by                                                              color/doppler      +----------+------------+---------+-----------+----------+--------------------+ Axillary                 Yes       Yes                   patent by                                                              color/doppler     +----------+------------+---------+-----------+----------+--------------------+ Brachial      Full       Yes       Yes                                   +----------+------------+---------+-----------+----------+--------------------+ Radial        Full                                                       +----------+------------+---------+-----------+----------+--------------------+ Ulnar         Full                                                       +----------+------------+---------+-----------+----------+--------------------+ Cephalic      Full                                                       +----------+------------+---------+-----------+----------+--------------------+ Basilic                                                Not visualized    +----------+------------+---------+-----------+----------+--------------------+ enlarged lymph nodes seen in neck (left side).  Summary:  Right: No evidence of thrombosis in the subclavian.  Left: No evidence of deep vein thrombosis in the upper extremity. No evidence of superficial vein thrombosis in the upper extremity. However, unable to visualize the basilic vein. Subcutaneous edema throughout upper extremity.  *See table(s) above for measurements and observations.  Diagnosing physician: Lonni Gaskins MD Electronically signed by Lonni Gaskins MD on 08/14/2024 at 4:59:56 PM.    Final    CT Angio Chest PE W and/or Wo Contrast Result Date: 08/14/2024 CLINICAL DATA:  Weakness, fever, diarrhea, head and neck cancer EXAM: CT ANGIOGRAPHY CHEST WITH CONTRAST TECHNIQUE: Multidetector CT imaging of the chest was performed using the standard protocol during bolus administration of  intravenous contrast. Multiplanar CT image reconstructions and MIPs were obtained to evaluate the vascular anatomy. RADIATION DOSE REDUCTION: This exam was performed according to the departmental dose-optimization program which includes automated exposure control, adjustment of the mA and/or kV according to patient size and/or use of iterative reconstruction technique. CONTRAST:  75mL OMNIPAQUE  IOHEXOL  350 MG/ML SOLN COMPARISON:  08/07/2024 FINDINGS: Cardiovascular: This is a technically adequate evaluation of the pulmonary vasculature. No filling defects or pulmonary emboli. The heart is unremarkable without pericardial effusion. No evidence of thoracic aortic aneurysm or dissection. Atherosclerosis of the aorta and coronary vasculature. Right chest wall port via internal jugular approach tip within the superior vena cava. Mediastinum/Nodes: Right hilar adenopathy measuring up to 1.3 cm in short axis. No other pathologic adenopathy within the mediastinum or axillary regions. Thyroid , trachea, and esophagus are unremarkable. Lungs/Pleura: Since the previous exam, there has been progression of the multifocal bilateral airspace disease, greatest in the right upper lobe. Interval development of trace bilateral pleural effusions. No pneumothorax. The central airways are patent. Upper Abdomen: Percutaneous gastrostomy tube within the lumen of the gastric antrum. No acute upper abdominal findings. Musculoskeletal: No acute or destructive bony abnormalities. Reconstructed images demonstrate no additional findings. Review of the MIP images confirms the above findings. IMPRESSION: 1. No evidence of pulmonary embolus. 2. Progressive multifocal bilateral airspace disease, greatest in the right upper lobe, consistent with bilateral bronchopneumonia. 3. Trace bilateral parapneumonic effusions. 4. Stable right hilar lymphadenopathy. 5. Aortic Atherosclerosis (ICD10-I70.0). Coronary artery atherosclerosis. The Electronically  Signed   By: Ozell Daring M.D.   On: 08/14/2024 16:08   DG Chest Portable 1 View Result Date: 08/14/2024 CLINICAL DATA:  Shortness of breath. EXAM: PORTABLE CHEST 1 VIEW COMPARISON:  Chest radiograph dated 08/07/2024. FINDINGS: Right-sided Port-A-Cath with tip at the cavoatrial junction. Right mid to lower lung field hazy density may represent atelectasis or pneumonia. No pleural effusion pneumothorax. Stable cardiac silhouette. No acute osseous pathology. IMPRESSION: Right mid to lower lung field atelectasis or pneumonia. Electronically Signed   By: Vanetta Chou M.D.   On: 08/14/2024 13:10   US  Abdomen Limited RUQ (LIVER/GB) Result Date: 08/11/2024 CLINICAL DATA:  Transaminitis EXAM: ULTRASOUND ABDOMEN LIMITED RIGHT UPPER QUADRANT COMPARISON:  PET-CT February 22, 2023. FINDINGS: Gallbladder: No gallstones or wall thickening visualized. No sonographic Murphy sign noted by sonographer. Common bile duct: Diameter: 4 mm Liver: No focal lesion identified. Normal parenchymal echogenicity. Portal vein is patent on color Doppler imaging with normal direction of blood flow towards the liver. Other: Incidental note was made of right-sided pleural effusion. IMPRESSION: No focal liver lesion. Right small pleural effusion. Electronically Signed   By: Megan  Zare M.D.   On: 08/11/2024 14:45   DG Swallowing Func-Speech Pathology Result Date: 08/09/2024 Table formatting from the original result was not included. Modified Barium Swallow Study Patient Details Name: VIRAL SCHRAMM MRN: [id number] Date of Birth: 04/02/1951 Today's Date: 08/09/2024 HPI/PMH: HPI: pt is a 73 yo male adm to [facility] with hypotension, poor intake.  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 He is undergoing concurrent chemoradiation. Last  chemotherapy with cisplatin  cycle 5 was on 07/26/2024.  Further chemotherapy was withheld because of neutropenia and has been receiving Zarxio  support in our clinic. Pt received a PEG tube for nutrition and reports having consumed po and using feeding tube for nutrition. 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  He admits odynophagia and dysphagia are equal parts responsible for his poor intake. Pt CXR 08/07/2024: Patchy airspace opacities, especially in the lower lobes, but also in the right upper lobe and right middle lobe potentially from atypical or multilobar pneumonia. Clinical Impression: Clinical Impression: Patient presents with mild oropharyngeal dysphagia likely due to his oropharyngeal mass, iatrogenic effects of XRT and his bilateral vocal fold atrophy.  These combined issues with edema result in decreased airway closure allowing trace aspiration of thin liquid with nonproductive cough response.  Aspiration noted most especially occuring early in the study - as barium spilled into open airway prior to swallow being triggered.  SLP questions some component of warm up effect. Further episodes of laryngeal penetration were inconsistent and only trace.  Mild retention noted in pharynx (more at tongue base with solids) post-swallow due to decreased tongue base retraction and inadequate mastication. Pt c/o severe xerostomia causing foods to adhere to his oral structures  No aspiration or penetration with cracker, puree or honey thick liquids.   Pharyngeal clearance with puree was excellent, likely due to combined weight of bolus allowing improved neuro input.  Various postures were not helpful to mitigate retention or aspiration - including chin tuck *tucked approx 20* due to neck stiffness* and head turn left.  Liquid swallows are more effective than dry swallows which are not really functional.  At this time, recommend pt be able to  start po diet with strict precautions.  If pt is reflexively coughing during intake, it is likely due to aspiration.  Given he has PEG tube for nutrition, recommend close monitoring of po diet via temperatures, lung sound monitoring, etc. Expect swallow to improve several week after XRT is completed. Factors that may increase risk of adverse event in presence of aspiration Noe & Lianne 2021): Factors that may increase risk of adverse event in presence of aspiration Noe & Lianne 2021): Reduced saliva; Poor general health and/or compromised immunity Recommendations/Plan: Swallowing Evaluation Recommendations Swallowing Evaluation Recommendations Recommendations: Dysphagia 1 (Pureed);(EXTRA GRAVY/SAUCES - CREAMY PUREES!) Thin liquids (Level 0 )=  CONSIDER WATER  WITH MEALS Liquid Administration via: Cup; Straw Medication Administration: Crushed with puree (or via PEG) Swallowing strategies : Start intake with WATER ; Small bites/sips. Follow solid w/liquid. Stop intake if overtly coughing. Clear throat intermittently Treatment Plan Treatment Plan Treatment recommendations: Therapy as outlined in treatment plan below Follow-up recommendations: Follow physicians's recommendations for discharge plan and follow up therapies Functional status assessment: Patient has had a recent decline in their functional status and demonstrates the ability to make significant improvements in function in a reasonable and predictable amount of time. Treatment frequency: Min 1x/week Treatment duration: 1 week Interventions: Aspiration precaution training; Patient/family education; Oropharyngeal exercises; Respiratory muscle strength training Recommendations Recommendations for follow up therapy are one component of a multi-disciplinary discharge planning process, led by the attending physician.  Recommendations may be updated based on patient status, additional functional criteria and insurance authorization. Assessment: Orofacial  Exam: Orofacial Exam Oral Cavity: Oral Hygiene: Xerostomia; Lingual coating; Edema; Erythema Oral Cavity - Dentition: Adequate natural dentition Orofacial Anatomy: Other (comment) (pt has known left tonsillar mass close to uvula and down to glossotonsillar sulcus) Anatomy: Anatomy: Suspected  cervical osteophytes Boluses Administered: Boluses Administered Boluses Administered: Thin liquids (Level 0); Mildly thick liquids (Level 2, nectar thick); Puree; Moderately thick liquids (Level 3, honey thick); Solid  Oral Impairment Domain: Oral Impairment Domain Lip Closure: No labial escape Tongue control during bolus hold: Cohesive bolus between tongue to palatal seal Bolus preparation/mastication: Minimal chewing/mashing with majority of bolus unchewed Bolus transport/lingual motion: Slow tongue motion Oral residue: Trace residue lining oral structures Location of oral residue : Palate; Tongue Initiation of pharyngeal swallow : Posterior laryngeal surface of the epiglottis; Pyriform sinuses; Valleculae  Pharyngeal Impairment Domain: Pharyngeal Impairment Domain Soft palate elevation: No bolus between soft palate (SP)/pharyngeal wall (PW) Laryngeal elevation: Partial superior movement of thyroid  cartilage/partial approximation of arytenoids to epiglottic petiole Anterior hyoid excursion: Partial anterior movement Epiglottic movement: Partial inversion Laryngeal vestibule closure: Incomplete, narrow column air/contrast in laryngeal vestibule Pharyngeal stripping wave : Present - diminished Pharyngeal contraction (A/P view only): N/A Pharyngoesophageal segment opening: Partial distention/partial duration, partial obstruction of flow Tongue base retraction: Narrow column of contrast or air between tongue base and PPW Pharyngeal residue: Collection of residue within or on pharyngeal structures; Trace residue within or on pharyngeal structures Location of pharyngeal residue: Tongue base; Valleculae (tongue base and vallecular  more than pharyngeal wall and pyriform sinus)  Esophageal Impairment Domain: Esophageal Impairment Domain Esophageal clearance upright position: Esophageal retention Pill: Pill Consistency administered: -- (DNT) Penetration/Aspiration Scale Score: Penetration/Aspiration Scale Score 1.  Material does not enter airway: Moderately thick liquids (Level 3, honey thick); Puree; Solid 2.  Material enters airway, remains ABOVE vocal cords then ejected out: Mildly thick liquids (Level 2, nectar thick) 7.  Material enters airway, passes BELOW cords and not ejected out despite cough attempt by patient: Thin liquids (Level 0) Compensatory Strategies: Compensatory Strategies Compensatory strategies: Yes Chin tuck: Ineffective Ineffective Chin Tuck: Thin liquid (Level 0) Liquid wash: Effective Left head turn: Ineffective Ineffective Left Head Turn: Mildly thick liquid (Level 2, nectar thick)   General Information: Caregiver present: Yes  Diet Prior to this Study: NPO; Other (Comment) (ice chips)   Temperature : Normal   Respiratory Status: WFL   Supplemental O2: None (Room air)   History of Recent Intubation: No  Behavior/Cognition: Alert; Cooperative; Pleasant mood Self-Feeding Abilities: Able to self-feed Baseline vocal quality/speech: Dysphonic Volitional Cough: Able to elicit Volitional Swallow: Able to elicit Exam Limitations: No limitations Goal Planning: Prognosis for improved oropharyngeal function: Good No data recorded No data recorded Patient/Family Stated Goal: He admits odynophagia and dysphagia are equal parts responsible for his poor intake. No data recorded Pain: Pain Assessment Pain Assessment: No/denies pain Faces Pain Scale: 2 Pain Location: oropharynx Pain Descriptors / Indicators: Aching; Burning Pain Intervention(s): Limited activity within patient's tolerance; Other (comment) (RN provided medication during session) End of Session: Start Time:SLP Start Time (ACUTE ONLY): 1210 Stop Time: SLP Stop Time (ACUTE  ONLY): 1235 Time Calculation:SLP Time Calculation (min) (ACUTE ONLY): 25 min Charges: SLP Evaluations $ SLP Speech Visit: 1 Visit SLP Evaluations $BSS Swallow: 1 Procedure $MBS Swallow: 1 Procedure $Swallowing Treatment: 1 Procedure SLP visit diagnosis: SLP Visit Diagnosis: Dysphagia, oropharyngeal phase (R13.12) Past Medical History: Past Medical History: Diagnosis Date  Asthma   Cancer (HCC)   prostate cancer  Coronary artery disease   pt states they were unable to place a stent at that time  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, follwed by [facility]  Dyspnea   with exertion  GERD (gastroesophageal reflux disease)   Hyperlipidemia  Hypertension   Pneumonia  Past Surgical History: Past Surgical History: Procedure Laterality Date  CATARACT EXTRACTION Bilateral   IR GASTROSTOMY TUBE MOD SED  06/23/2024  IR IMAGING GUIDED PORT INSERTION  06/23/2024  LEFT HEART CATH AND CORONARY ANGIOGRAPHY N/A 06/05/2022  Procedure: LEFT HEART CATH AND CORONARY ANGIOGRAPHY;  Surgeon: Claudene Victory ORN, MD;  Location: [facility];  Service: Cardiovascular;  Laterality: N/A;  LYMPHADENECTOMY Bilateral 04/29/2023  Procedure: BILATERAL PELVIC LYMPHADENECTOMY;  Surgeon: Renda Glance, MD;  Location: [facility];  Service: Urology;  Laterality: Bilateral;  PROSTATE BIOPSY    ROBOT ASSISTED LAPAROSCOPIC RADICAL PROSTATECTOMY N/A 04/29/2023  Procedure: XI ROBOTIC ASSISTED LAPAROSCOPIC RADICAL PROSTATECTOMY LEVEL 2;  Surgeon: Renda Glance, MD;  Location: [facility];  Service: Urology;  Laterality: N/A;  210 MINUTES NEEDED FOR CASE  UPPER GASTROINTESTINAL ENDOSCOPY    VENTRAL HERNIA REPAIR N/A 10/05/2023  Procedure: LAPAROSCOPIC VENTRAL HERNIA REPAAIR WITH MESH;  Surgeon: Rubin Calamity, MD;  Location: [facility] OR;  Service: General;  Laterality: N/A; [name], MS [facility] SLP Acute Rehab Services Office [phone] [name] 08/09/2024, 2:55 PM  CT Angio Chest PE W/Cm &/Or Wo Cm Result Date: 08/07/2024 EXAM: CTA CHEST 08/07/2024 01:30:55  PM TECHNIQUE: CTA of the chest was performed after the administration of intravenous contrast. Multiplanar reformatted images are provided for review. MIP images are provided for review. Automated exposure control, iterative reconstruction, and/or weight based adjustment of the mA/kV was utilized to reduce the radiation dose to as low as reasonably achievable. COMPARISON: None available. 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 * Tracking Code: BO * right port a cath tip colon SVC. FINDINGS: PULMONARY ARTERIES: Pulmonary arteries are adequately opacified for evaluation. No acute pulmonary embolus. Main pulmonary artery is normal in caliber. MEDIASTINUM: Mild cardiomegaly. Thoracic aortic, coronary artery, and branch vessel atheromatous vascular calcifications. There is no acute abnormality of the thoracic aorta. LYMPH NODES: Right high lung node 1.3 cm in short axis on image 62 series 5, formerly 0.7 cm. No mediastinal or axillary lymphadenopathy. LUNGS AND PLEURA: Bilateral airway thickening noted. Patchy and nodular regions of airspace opacity in the right upper lobe, right middle lobe, and confluent in both lower lobes especially dependently raising the possibility of multilobar pneumonia. There is also some faint ground glass opacity posteriorly in the lingula. No evidence of pleural effusion or pneumothorax. UPPER ABDOMEN: Limited images of the upper abdomen are unremarkable. Peg tube noted. SOFT TISSUES AND BONES: Thoracic spondylosis. No acute bone or soft tissue abnormality. IMPRESSION: 1. No pulmonary embolism. 2. Patchy airspace opacities, especially in the lower lobes, but also in the right upper lobe and right middle lobe potentially from atypical or multilobar pneumonia. 3. Enlarged right high lung lymph node measuring 1.3 cm short axis, increased from 0.7 cm. This is likely reactive  given the findings in the lung, although surveillance is suggested. 4. Mild cardiomegaly. 5. Thoracic aortic and coronary artery atherosclerosis. 6. Bilateral airway thickening. 7. Thoracic spondylosis. Electronically signed by: Ryan Salvage MD 08/07/2024 02:26 PM EST RP Workstation: [id number]   DG Chest Port 1 View Result Date: 08/07/2024 CLINICAL DATA:  Questionable sepsis. Head and neck cancer, hypotension with decreased O2 sats. EXAM: PORTABLE CHEST 1 VIEW COMPARISON:  09/28/2022.  CT chest 04/06/2024. FINDINGS: Right IJ Port-A-Cath tip is at the SVC RA junction. Lungs are clear. No pleural fluid. IMPRESSION: No acute findings. Electronically Signed   By: Newell Eke  M.D.   On: 08/07/2024 10:59

## 2024-08-15 NOTE — Progress Notes (Signed)
  Echocardiogram 2D Echocardiogram has been performed.  Koleen KANDICE Popper, RDCS 08/15/2024, 8:55 AM

## 2024-08-15 NOTE — Plan of Care (Signed)

## 2024-08-15 NOTE — Progress Notes (Signed)
 Initial Nutrition Assessment  DOCUMENTATION CODES:   Non-severe (moderate) malnutrition in context of acute illness/injury  INTERVENTION:   -Continue Osmolite 1.5 @ 65 ml/hr via G-tube -Provides 2340 kcals, 97g protein and 1188 ml H2O  -Banatrol fiber supplement TID, each provides 45 kcals, 2g protein and 5g soluble fiber to aid diarrhea   NUTRITION DIAGNOSIS:   Moderate Malnutrition related to acute illness as evidenced by mild fat depletion, mild muscle depletion.  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  Upon admission started on broad-spectrum antibiotic.  Patient in room, wife at bedside. Pt reports his last radiation was today.  Wife reports pt began having frequent diarrhea at home. Was advised by [facility] to give pt Boost VHC via bolus gravity in tube but did not tolerate well given diarrhea and volume.  Pt on regular diet but not consuming anything by mouth. Pain, poor taste and dysphagia all factors in this. Pt now on continuous pump feeds and tolerating well. Last BM this morning which was tested for c.diff, test was positive for antigen but will be tested again to confirm.  Pt with risk of aspiration pneumonia, and recently treated for bilateral pneumonia. Per SLP note 11/18, pt with severe aspiration risk. Bolus feeding would impose higher risk of further aspiration so recommend continuing continuous feeds.    Per weight records, pt has lost 8 lbs since 9/29 (4% wt loss x 2  months, insignificant for time frame).  Medications: KCl, Magic mouthwash w/ lidocaine   Labs reviewed: Low sodium Low potassium Low Mg   NUTRITION - FOCUSED PHYSICAL EXAM:  Flowsheet Row Most Recent Value  Orbital Region Mild depletion  Upper Arm Region Mild depletion  Thoracic and Lumbar Region Mild depletion  Buccal Region Mild depletion  Temple Region Moderate depletion  Clavicle Bone Region No depletion  Clavicle and Acromion Bone Region Mild depletion  Scapular Bone Region Mild depletion  Dorsal Hand No depletion  Patellar Region No depletion  Anterior Thigh Region No depletion  Posterior Calf Region No depletion  Edema (RD Assessment) None  Hair Reviewed  Eyes Reviewed  Mouth Reviewed  Skin Reviewed  Nails Reviewed    Diet Order:   Diet Order             Diet regular Room service appropriate? Yes; Fluid consistency: Thin  Diet effective now                   EDUCATION NEEDS:   Education needs have been addressed  Skin:  Skin Assessment: Reviewed RN Assessment  Last BM:  11/25  Height:   Ht Readings from Last 1 Encounters:  08/14/24 5' 9 (1.753 m)    Weight:   Wt Readings from Last 1 Encounters:  08/14/24 80.7 kg    BMI:  Body mass index is 26.27 kg/m.  Estimated Nutritional Needs:   Kcal:  2200-2450  Protein:  100-125g  Fluid:  2.2L/day   Morna Lee, MS, RD, LDN Inpatient Clinical Dietitian Contact via Secure chat

## 2024-08-15 NOTE — Progress Notes (Signed)
 PROGRESS NOTE    Gilbert Thompson  FMW:[id number] DOB: 03/30/1951 DOA: 08/14/2024 PCP: Windy Coy, MD    Brief Narrative:   73 year old with history of asthma, prostate cancer, CAD, GERD, carcinoma of oropharynx completed chemo and now on radiation and recently hospitalized for sepsis secondary to pneumonia coming to the hospital for diarrhea, weakness and lower extremity edema from the cancer center.  Upon admission started on broad-spectrum antibiotic.  Suspect his diarrhea is secondary to tube feeds but given his history, C. difficile and GI panel has been sent.  Assessment & Plan:   Left  Upper extremity edema -No DVT seen on Dopplers.    Multifocal pneumonia -Recently hospitalized for pneumonia.  Most of the workup was negative and eventually his broad-spectrum antibiotics were transitioned to p.o. Augmentin  for 7 more days and was discharged with bronchodilators. Currently on Vanc/Cefepime .  History of CAD/hyperlipidemia - On aspirin  and Zetia .  Statin on hold  Anemia of chronic disease - Baseline hemoglobin around 8.0, this morning 7.1.  Likely from chemoradiation.  Will discuss 1 unit PRBC transfusion   Primary squamous cell carcinoma of the oropharynx -Completed chemotherapy.  Final radiation planned on 11/25.  Pain control, bowel regimen.    Hypokalemia - As needed repletion   Diarrhea Chronic dysphagia Severe protein calorie malnutrition -Possibly related to antibiotics and tube feeds.  Closely monitor this.  No abdominal pain.  C. difficile and GI panel has been sent -On tube feeds via peg tube along with dysphagia 1 diet   Transaminitis -noted on last hospitalization, resolving.  Continue to avoid hepatotoxins as able.   Grade 1 diastolic dysfunction -Echocardiogram 2023 showed EF of 60% with grade 1 DD.  Upon admission repeat echo has been ordered.  Since     DVT prophylaxis: Lovenox     Code Status: Full Code Family Communication:   Status is:  Inpatient Remains inpatient appropriate because: Ongoing management for multifocal pneumonia, weakness and anemia   PT Follow up Recs:   Subjective: Doing ok Overall feels a little better.  Tells me he started having diarrhea basically after starting his tube feeds and bolus feeding.   Examination:  General exam: Appears calm and comfortable  Respiratory system: Clear to auscultation. Respiratory effort normal. Cardiovascular system: S1 & S2 heard, RRR. No JVD, murmurs, rubs, gallops or clicks. No pedal edema. Gastrointestinal system: Abdomen is nondistended, soft and nontender. No organomegaly or masses felt. Normal bowel sounds heard. Central nervous system: Alert and oriented. No focal neurological deficits. Extremities: Symmetric 5 x 5 power. Skin: No rashes, lesions or ulcers Psychiatry: Judgement and insight appear normal. Mood & affect appropriate.                Diet Orders (From admission, onward)     Start     Ordered   08/14/24 1746  Diet regular Room service appropriate? Yes; Fluid consistency: Thin  Diet effective now       Question Answer Comment  Room service appropriate? Yes   Fluid consistency: Thin      08/14/24 1746            Objective: Vitals:   08/14/24 2115 08/15/24 0115 08/15/24 0545 08/15/24 0817  BP: 128/66 118/66 113/68   Pulse: 84 88 92   Resp: 18 18 18    Temp: 98.1 F (36.7 C) 97.6 F (36.4 C) 98 F (36.7 C)   TempSrc: Oral Oral    SpO2: 98% 97% 97% 96%  Weight:  Height:        Intake/Output Summary (Last 24 hours) at 08/15/2024 1006 Last data filed at 08/15/2024 0958 Gross per 24 hour  Intake 491.27 ml  Output 400 ml  Net 91.27 ml   Filed Weights   08/14/24 1147 08/14/24 1224  Weight: 80.7 kg 80.7 kg    Scheduled Meds:  allopurinol   300 mg Per Tube Daily   aspirin  EC  81 mg Oral Daily   budesonide -glycopyrrolate -formoterol   2 puff Inhalation BID   Chlorhexidine  Gluconate Cloth  6 each Topical Daily    enoxaparin  (LOVENOX ) injection  40 mg Subcutaneous Q24H   ezetimibe   10 mg Oral Daily   finasteride   5 mg Oral Daily   HYDROcodone -acetaminophen   1 tablet Per Tube Q6H   morphine   30 mg Oral Q12H   pantoprazole  (PROTONIX ) IV  40 mg Intravenous Q12H   sodium chloride  flush  10-40 mL Intracatheter Q12H   Continuous Infusions:  sodium chloride  10 mL/hr at 08/14/24 2148   ceFEPime  (MAXIPIME ) IV Stopped (08/15/24 0204)   feeding supplement (OSMOLITE 1.5 CAL) 1,000 mL (08/14/24 2142)   potassium chloride      vancomycin       Nutritional status     Body mass index is 26.27 kg/m.  Data Reviewed:   CBC: Recent Labs  Lab 08/09/24 0355 08/10/24 0553 08/11/24 0600 08/12/24 0444 08/14/24 1155 08/15/24 0400  WBC 1.5* 1.6* 2.6* 3.3* 5.1 3.2*  NEUTROABS 1.1* 1.1* 1.8 2.3 4.0  --   HGB 8.9* 8.3* 7.8* 8.4* 9.6* 7.1*  HCT 25.3* 24.1* 22.3* 24.5* 28.3* 21.2*  MCV 84.9 88.0 86.4 87.5 88.4 89.8  PLT 58* 62* 69* 96* 133* 112*   Basic Metabolic Panel: Recent Labs  Lab 08/08/24 1744 08/08/24 1744 08/09/24 0355 08/09/24 1731 08/10/24 0553 08/11/24 0600 08/12/24 0444 08/14/24 1155 08/14/24 1400 08/15/24 0400  NA  --    < > 135  --  135 133* 132* 132*  --  133*  K  --    < > 3.5  --  3.4* 3.5 3.3* 3.7  --  3.4*  CL  --    < > 100  --  102 100 96* 96*  --  99  CO2  --    < > 21*  --  24 25 25 26   --  27  GLUCOSE  --    < > 111*  --  132* 96 115* 112*  --  164*  BUN  --    < > 22  --  21 16 18 18   --  16  CREATININE  --    < > 1.09  --  0.93 0.83 0.84 0.95  --  0.77  CALCIUM   --    < > 9.2  --  8.6* 8.2* 8.4* 9.0  --  8.4*  MG 1.9  --  2.0 1.7 1.7 1.7 1.8  --  1.5*  --   PHOS 3.0  --  2.4* 1.8* 2.5  --   --   --   --   --    < > = values in this interval not displayed.   GFR: Estimated Creatinine Clearance: 81 mL/min (by C-G formula based on SCr of 0.77 mg/dL). Liver Function Tests: Recent Labs  Lab 08/09/24 0355 08/10/24 0553 08/11/24 0930 08/12/24 0444 08/14/24 1155  AST  127* 114* 67* 72* 61*  ALT 103* 116* 90* 88* 69*  ALKPHOS 57 69 67 84 96  BILITOT 0.5 0.4 0.5 0.4 0.4  PROT 6.1* 5.9* 5.8* 6.3* 7.2  ALBUMIN 2.6* 2.5* 2.4* 2.6* 2.6*   Recent Labs  Lab 08/14/24 1155  LIPASE 11   No results for input(s): AMMONIA in the last 168 hours. Coagulation Profile: No results for input(s): INR, PROTIME in the last 168 hours. Cardiac Enzymes: No results for input(s): CKTOTAL, CKMB, CKMBINDEX, TROPONINI in the last 168 hours. BNP (last 3 results) Recent Labs    08/14/24 1155  PROBNP 677.0*   HbA1C: No results for input(s): HGBA1C in the last 72 hours. CBG: Recent Labs  Lab 08/11/24 1938 08/12/24 0018 08/12/24 0413 08/12/24 0800 08/12/24 1317  GLUCAP 112* 133* 131* 134* 130*   Lipid Profile: No results for input(s): CHOL, HDL, LDLCALC, TRIG, CHOLHDL, LDLDIRECT in the last 72 hours. Thyroid  Function Tests: No results for input(s): TSH, T4TOTAL, FREET4, T3FREE, THYROIDAB in the last 72 hours. Anemia Panel: No results for input(s): VITAMINB12, FOLATE, FERRITIN, TIBC, IRON, RETICCTPCT in the last 72 hours. Sepsis Labs: No results for input(s): PROCALCITON, LATICACIDVEN in the last 168 hours.  Recent Results (from the past 240 hours)  Blood Culture (routine x 2)     Status: None   Collection Time: 08/07/24 10:36 AM   Specimen: BLOOD  Result Value Ref Range Status   Specimen Description   Final    BLOOD RIGHT ANTECUBITAL Performed at [facility], [address] [place], [place] [address]    Special Requests   Final    BOTTLES DRAWN AEROBIC AND ANAEROBIC Blood Culture results may not be optimal due to an inadequate volume of blood received in culture bottles Performed at [facility], [address] [place], [place] [address]    Culture   Final    NO GROWTH 5 DAYS Performed at [facility], [address] [place], [place] [address]    Report  Status 08/12/2024 FINAL  Final  Blood Culture (routine x 2)     Status: None   Collection Time: 08/07/24 10:36 AM   Specimen: BLOOD  Result Value Ref Range Status   Specimen Description   Final    BLOOD BLOOD RIGHT FOREARM Performed at [facility], [address], [place], [place] [address]    Special Requests   Final    BOTTLES DRAWN AEROBIC ONLY Blood Culture results may not be optimal due to an inadequate volume of blood received in culture bottles Performed at [facility], [address], [place], [place] [address]    Culture   Final    NO GROWTH 5 DAYS Performed at [facility], [address] [place], [place] [address]    Report Status 08/12/2024 FINAL  Final  Resp panel by RT-PCR (RSV, Flu A&B, Covid) Anterior Nasal Swab     Status: None   Collection Time: 08/07/24 11:40 AM   Specimen: Anterior Nasal Swab  Result Value Ref Range Status   SARS Coronavirus 2 by RT PCR NEGATIVE NEGATIVE Final    Comment: (NOTE) SARS-CoV-2 target nucleic acids are NOT DETECTED.  The SARS-CoV-2 RNA is generally detectable in upper respiratory specimens during the acute phase of infection. The lowest concentration of SARS-CoV-2 viral copies this assay can detect is 138 copies/mL. A negative result does not preclude SARS-Cov-2 infection and should not be used as the sole basis for treatment or other patient management decisions. A negative result may occur with  improper specimen collection/handling, submission of specimen other than nasopharyngeal swab, presence of viral mutation(s)  within the areas targeted by this assay, and inadequate number of viral copies(<138 copies/mL). A negative result must be combined with clinical observations, patient history, and epidemiological information. The expected result is Negative.  Fact Sheet for Patients:  bloggercourse.com  Fact Sheet for Healthcare Providers:   seriousbroker.it  This test is no t yet approved or cleared by the [place]  FDA and  has been authorized for detection and/or diagnosis of SARS-CoV-2 by FDA under an Emergency Use Authorization (EUA). This EUA will remain  in effect (meaning this test can be used) for the duration of the COVID-19 declaration under Section 564(b)(1) of the Act, 21 U.S.C.section 360bbb-3(b)(1), unless the authorization is terminated  or revoked sooner.       Influenza A by PCR NEGATIVE NEGATIVE Final   Influenza B by PCR NEGATIVE NEGATIVE Final    Comment: (NOTE) The Xpert Xpress SARS-CoV-2/FLU/RSV plus assay is intended as an aid in the diagnosis of influenza from Nasopharyngeal swab specimens and should not be used as a sole basis for treatment. Nasal washings and aspirates are unacceptable for Xpert Xpress SARS-CoV-2/FLU/RSV testing.  Fact Sheet for Patients: bloggercourse.com  Fact Sheet for Healthcare Providers: seriousbroker.it  This test is not yet approved or cleared by the [place]  FDA and has been authorized for detection and/or diagnosis of SARS-CoV-2 by FDA under an Emergency Use Authorization (EUA). This EUA will remain in effect (meaning this test can be used) for the duration of the COVID-19 declaration under Section 564(b)(1) of the Act, 21 U.S.C. section 360bbb-3(b)(1), unless the authorization is terminated or revoked.     Resp Syncytial Virus by PCR NEGATIVE NEGATIVE Final    Comment: (NOTE) Fact Sheet for Patients: bloggercourse.com  Fact Sheet for Healthcare Providers: seriousbroker.it  This test is not yet approved or cleared by the [place]  FDA and has been authorized for detection and/or diagnosis of SARS-CoV-2 by FDA under an Emergency Use Authorization (EUA). This EUA will remain in effect (meaning this test can be used) for  the duration of the COVID-19 declaration under Section 564(b)(1) of the Act, 21 U.S.C. section 360bbb-3(b)(1), unless the authorization is terminated or revoked.  Performed at [facility], [address], [place], [place] [address]   MRSA Next Gen by PCR, Nasal     Status: None   Collection Time: 08/07/24  8:31 PM   Specimen: Nasal Mucosa; Nasal Swab  Result Value Ref Range Status   MRSA by PCR Next Gen NOT DETECTED NOT DETECTED Final    Comment: (NOTE) The GeneXpert MRSA Assay (FDA approved for NASAL specimens only), is one component of a comprehensive MRSA colonization surveillance program. It is not intended to diagnose MRSA infection nor to guide or monitor treatment for MRSA infections. Test performance is not FDA approved in patients less than 54 years old. Performed at [facility], [address], [place], [place] [address]   Expectorated Sputum Assessment w Gram Stain, Rflx to Resp Cult     Status: None   Collection Time: 08/08/24  2:25 AM   Specimen: Expectorated Sputum  Result Value Ref Range Status   Specimen Description EXPECTORATED SPUTUM  Final   Special Requests NONE  Final   Sputum evaluation   Final    Sputum specimen not acceptable for testing.  Please recollect.   NOTIFIED MATHEW HERO RN OF RECOLLECT @ [phone] ON 08/08/2024 BY MTA Performed at [facility], [address], [place], [place] [address]    Report  Status 08/08/2024 FINAL  Final  Expectorated Sputum Assessment w Gram Stain, Rflx to Resp Cult     Status: None   Collection Time: 08/08/24  9:15 AM  Result Value Ref Range Status   Specimen Description EXPECTORATED SPUTUM  Final   Special Requests NONE  Final   Sputum evaluation   Final    THIS SPECIMEN IS ACCEPTABLE FOR SPUTUM CULTURE Performed at [facility], [address] [place], [place] [address]    Report Status 08/08/2024 FINAL  Final  Culture, Respiratory w Gram  Stain     Status: None   Collection Time: 08/08/24  9:15 AM  Result Value Ref Range Status   Specimen Description   Final    EXPECTORATED SPUTUM Performed at [facility], [address], [place], [place] [address]    Special Requests   Final    NONE Reflexed from [phone] Performed at [facility], [address], [place], [place] [address]    Gram Stain   Final    ABUNDANT WBC PRESENT, PREDOMINANTLY PMN MODERATE GRAM POSITIVE RODS    Culture   Final    FEW STAPHYLOCOCCUS AUREUS WITHIN MIXED FLORA Performed at [facility], [address], [place], [place] [address]    Report Status 08/11/2024 FINAL  Final   Organism ID, Bacteria STAPHYLOCOCCUS AUREUS  Final      Susceptibility   Staphylococcus aureus - MIC*    CIPROFLOXACIN <=0.5 SENSITIVE Sensitive     ERYTHROMYCIN <=0.25 SENSITIVE Sensitive     GENTAMICIN <=0.5 SENSITIVE Sensitive     OXACILLIN 0.5 SENSITIVE Sensitive     TETRACYCLINE <=1 SENSITIVE Sensitive     VANCOMYCIN  1 SENSITIVE Sensitive     TRIMETH /SULFA  <=10 SENSITIVE Sensitive     CLINDAMYCIN <=0.25 SENSITIVE Sensitive     RIFAMPIN <=0.5 SENSITIVE Sensitive     Inducible Clindamycin NEGATIVE Sensitive     LINEZOLID 2 SENSITIVE Sensitive     * FEW STAPHYLOCOCCUS AUREUS         Radiology Studies: ECHOCARDIOGRAM COMPLETE Result Date: 08/15/2024    ECHOCARDIOGRAM REPORT   Patient Name:   AMIL BOUWMAN Date of Exam: 08/15/2024 Medical Rec #:  [id number]       Height:       69.0 in Accession #:    [id number]      Weight:       177.9 lb Date of Birth:  March 20, 1951       BSA:          1.966 m Patient Age:    73 years        BP:           113/68 mmHg Patient Gender: M               HR:           93 bpm. Exam Location:  Inpatient Procedure: 2D Echo, 3D Echo, Color Doppler, Cardiac Doppler and Strain Analysis            (Both Spectral and Color Flow Doppler were utilized during            procedure). Indications:    CHF- Acute  Diastolic I50.31  History:        Patient has prior history of Echocardiogram examinations, most                 recent 05/04/2022. CAD, hx of cancer, Signs/Symptoms:Shortness of  Breath and Dyspnea; Risk Factors:Hypertension and Dyslipidemia.  Sonographer:    Koleen Popper RDCS Referring Phys: [id number] MIR M [facility]  Sonographer Comments: No subcostal window. Global longitudinal strain was attempted. IMPRESSIONS  1. Left ventricular ejection fraction, by estimation, is 60 to 65%. The left ventricle has normal function. The left ventricle has no regional wall motion abnormalities. There is mild concentric left ventricular hypertrophy. Indeterminate diastolic filling due to E-A fusion.  2. Right ventricular systolic function is normal. The right ventricular size is normal.  3. The mitral valve is grossly normal. No evidence of mitral valve regurgitation. No evidence of mitral stenosis.  4. The aortic valve is tricuspid. There is mild calcification of the aortic valve. Aortic valve regurgitation is not visualized. Aortic valve sclerosis is present, with no evidence of aortic valve stenosis. FINDINGS  Left Ventricle: Left ventricular ejection fraction, by estimation, is 60 to 65%. The left ventricle has normal function. The left ventricle has no regional wall motion abnormalities. Global longitudinal strain performed but not reported based on interpreter judgement due to suboptimal tracking. 3D ejection fraction reviewed and evaluated as part of the interpretation. Alternate measurement of EF is felt to be most reflective of LV function. The left ventricular internal cavity size was normal in  size. There is mild concentric left ventricular hypertrophy. Indeterminate diastolic filling due to E-A fusion. Right Ventricle: The right ventricular size is normal. No increase in right ventricular wall thickness. Right ventricular systolic function is normal. Left Atrium: Left atrial size was normal in size.  Right Atrium: Right atrial size was normal in size. Pericardium: Trivial pericardial effusion is present. Presence of epicardial fat layer. Mitral Valve: The mitral valve is grossly normal. No evidence of mitral valve regurgitation. No evidence of mitral valve stenosis. Tricuspid Valve: The tricuspid valve is grossly normal. Tricuspid valve regurgitation is trivial. No evidence of tricuspid stenosis. Aortic Valve: The aortic valve is tricuspid. There is mild calcification of the aortic valve. Aortic valve regurgitation is not visualized. Aortic valve sclerosis is present, with no evidence of aortic valve stenosis. Pulmonic Valve: The pulmonic valve was grossly normal. Pulmonic valve regurgitation is not visualized. No evidence of pulmonic stenosis. Aorta: The aortic root and ascending aorta are structurally normal, with no evidence of dilitation. Venous: The inferior vena cava was not well visualized. IAS/Shunts: The atrial septum is grossly normal. Additional Comments: 3D was performed not requiring image post processing on an independent workstation and was indeterminate.  LEFT VENTRICLE PLAX 2D LVIDd:         4.30 cm      Diastology LVIDs:         2.70 cm      LV e' medial:    7.07 cm/s LV PW:         1.10 cm      LV E/e' medial:  12.6 LV IVS:        1.20 cm      LV e' lateral:   11.00 cm/s LVOT diam:     2.20 cm      LV E/e' lateral: 8.1 LV SV:         69 LV SV Index:   35 LVOT Area:     3.80 cm                              3D Volume EF: LV Volumes (MOD)  3D EF:        60 % LV vol d, MOD A2C: 110.0 ml LV EDV:       155 ml LV vol s, MOD A2C: 41.2 ml  LV ESV:       62 ml LV SV MOD A2C:     68.8 ml  LV SV:        93 ml RIGHT VENTRICLE RV Basal diam:  3.90 cm RV S prime:     15.30 cm/s TAPSE (M-mode): 2.0 cm LEFT ATRIUM             Index        RIGHT ATRIUM           Index LA diam:        4.00 cm 2.03 cm/m   RA Area:     13.00 cm LA Vol (A2C):   38.8 ml 19.74 ml/m  RA Volume:   30.10 ml  15.31 ml/m LA  Vol (A4C):   36.5 ml 18.57 ml/m LA Biplane Vol: 37.8 ml 19.23 ml/m  AORTIC VALVE LVOT Vmax:   101.00 cm/s LVOT Vmean:  67.900 cm/s LVOT VTI:    0.181 m  AORTA Ao Root diam: 3.60 cm Ao Asc diam:  3.80 cm MITRAL VALVE                TRICUSPID VALVE MV Area (PHT): 3.65 cm     TR Peak grad:   15.5 mmHg MV Decel Time: 208 msec     TR Vmax:        197.00 cm/s MV E velocity: 88.90 cm/s MV A velocity: 132.00 cm/s  SHUNTS MV E/A ratio:  0.67         Systemic VTI:  0.18 m                             Systemic Diam: 2.20 cm Darryle Decent MD Electronically signed by Darryle Decent MD Signature Date/Time: 08/15/2024/9:48:46 AM    Final    UE VENOUS DUPLEX (7am - 7pm) Result Date: 08/14/2024 UPPER VENOUS STUDY  Patient Name:  WAYLAN BUSTA  Date of Exam:   08/14/2024 Medical Rec #: [id number]        Accession #:    [id number] Date of Birth: 1951-05-11        Patient Gender: M Patient Age:   25 years Exam Location:  [facility] Procedure:      VAS US  UPPER EXTREMITY VENOUS DUPLEX Referring Phys: ADAM CURATOLO --------------------------------------------------------------------------------  Indications: Swelling Risk Factors: Primary squamous cell carcinoma of oropharynx, chemotherapy/radiation. Limitations: Body habitus and poor ultrasound/tissue interface. Comparison Study: No previous exams Performing Technologist: Alberta Lis RVS  Examination Guidelines: A complete evaluation includes B-mode imaging, spectral Doppler, color Doppler, and power Doppler as needed of all accessible portions of each vessel. Bilateral testing is considered an integral part of a complete examination. Limited examinations for reoccurring indications may be performed as noted.  Right Findings: +----------+------------+---------+-----------+----------+-----------------+ RIGHT     CompressiblePhasicitySpontaneousProperties     Summary      +----------+------------+---------+-----------+----------+-----------------+ Subclavian                Yes       Yes              patent by doppler +----------+------------+---------+-----------+----------+-----------------+  Left Findings: +----------+------------+---------+-----------+----------+--------------------+ LEFT      CompressiblePhasicitySpontaneousProperties      Summary        +----------+------------+---------+-----------+----------+--------------------+  IJV                      Yes       Yes                   patent by                                                              color/doppler     +----------+------------+---------+-----------+----------+--------------------+ Subclavian               Yes       Yes                   patent by                                                              color/doppler     +----------+------------+---------+-----------+----------+--------------------+ Axillary                 Yes       Yes                   patent by                                                              color/doppler     +----------+------------+---------+-----------+----------+--------------------+ Brachial      Full       Yes       Yes                                   +----------+------------+---------+-----------+----------+--------------------+ Radial        Full                                                       +----------+------------+---------+-----------+----------+--------------------+ Ulnar         Full                                                       +----------+------------+---------+-----------+----------+--------------------+ Cephalic      Full                                                       +----------+------------+---------+-----------+----------+--------------------+ Basilic  Not visualized    +----------+------------+---------+-----------+----------+--------------------+ enlarged lymph nodes seen in neck  (left side).  Summary:  Right: No evidence of thrombosis in the subclavian.  Left: No evidence of deep vein thrombosis in the upper extremity. No evidence of superficial vein thrombosis in the upper extremity. However, unable to visualize the basilic vein. Subcutaneous edema throughout upper extremity.  *See table(s) above for measurements and observations.  Diagnosing physician: Lonni Gaskins MD Electronically signed by Lonni Gaskins MD on 08/14/2024 at 4:59:56 PM.    Final    CT Angio Chest PE W and/or Wo Contrast Result Date: 08/14/2024 CLINICAL DATA:  Weakness, fever, diarrhea, head and neck cancer EXAM: CT ANGIOGRAPHY CHEST WITH CONTRAST TECHNIQUE: Multidetector CT imaging of the chest was performed using the standard protocol during bolus administration of intravenous contrast. Multiplanar CT image reconstructions and MIPs were obtained to evaluate the vascular anatomy. RADIATION DOSE REDUCTION: This exam was performed according to the departmental dose-optimization program which includes automated exposure control, adjustment of the mA and/or kV according to patient size and/or use of iterative reconstruction technique. CONTRAST:  75mL OMNIPAQUE  IOHEXOL  350 MG/ML SOLN COMPARISON:  08/07/2024 FINDINGS: Cardiovascular: This is a technically adequate evaluation of the pulmonary vasculature. No filling defects or pulmonary emboli. The heart is unremarkable without pericardial effusion. No evidence of thoracic aortic aneurysm or dissection. Atherosclerosis of the aorta and coronary vasculature. Right chest wall port via internal jugular approach tip within the superior vena cava. Mediastinum/Nodes: Right hilar adenopathy measuring up to 1.3 cm in short axis. No other pathologic adenopathy within the mediastinum or axillary regions. Thyroid , trachea, and esophagus are unremarkable. Lungs/Pleura: Since the previous exam, there has been progression of the multifocal bilateral airspace disease, greatest in  the right upper lobe. Interval development of trace bilateral pleural effusions. No pneumothorax. The central airways are patent. Upper Abdomen: Percutaneous gastrostomy tube within the lumen of the gastric antrum. No acute upper abdominal findings. Musculoskeletal: No acute or destructive bony abnormalities. Reconstructed images demonstrate no additional findings. Review of the MIP images confirms the above findings. IMPRESSION: 1. No evidence of pulmonary embolus. 2. Progressive multifocal bilateral airspace disease, greatest in the right upper lobe, consistent with bilateral bronchopneumonia. 3. Trace bilateral parapneumonic effusions. 4. Stable right hilar lymphadenopathy. 5. Aortic Atherosclerosis (ICD10-I70.0). Coronary artery atherosclerosis. The Electronically Signed   By: Ozell Daring M.D.   On: 08/14/2024 16:08   DG Chest Portable 1 View Result Date: 08/14/2024 CLINICAL DATA:  Shortness of breath. EXAM: PORTABLE CHEST 1 VIEW COMPARISON:  Chest radiograph dated 08/07/2024. FINDINGS: Right-sided Port-A-Cath with tip at the cavoatrial junction. Right mid to lower lung field hazy density may represent atelectasis or pneumonia. No pleural effusion pneumothorax. Stable cardiac silhouette. No acute osseous pathology. IMPRESSION: Right mid to lower lung field atelectasis or pneumonia. Electronically Signed   By: Vanetta Chou M.D.   On: 08/14/2024 13:10           LOS: 1 day   Time spent= 35 mins    Burgess JAYSON Dare, MD [facility]  If 7PM-7AM, please contact night-coverage  08/15/2024, 10:06 AM

## 2024-08-16 ENCOUNTER — Other Ambulatory Visit: Payer: Self-pay | Admitting: Hematology

## 2024-08-16 DIAGNOSIS — C109 Malignant neoplasm of oropharynx, unspecified: Secondary | ICD-10-CM | POA: Diagnosis not present

## 2024-08-16 DIAGNOSIS — E440 Moderate protein-calorie malnutrition: Secondary | ICD-10-CM

## 2024-08-16 DIAGNOSIS — R1310 Dysphagia, unspecified: Secondary | ICD-10-CM | POA: Diagnosis not present

## 2024-08-16 LAB — COMPREHENSIVE METABOLIC PANEL WITH GFR
ALT: 75 U/L — ABNORMAL HIGH (ref 0–44)
AST: 74 U/L — ABNORMAL HIGH (ref 15–41)
Albumin: 2.5 g/dL — ABNORMAL LOW (ref 3.5–5.0)
Alkaline Phosphatase: 95 U/L (ref 38–126)
Anion gap: 10 (ref 5–15)
BUN: 13 mg/dL (ref 8–23)
CO2: 24 mmol/L (ref 22–32)
Calcium: 8.6 mg/dL — ABNORMAL LOW (ref 8.9–10.3)
Chloride: 101 mmol/L (ref 98–111)
Creatinine, Ser: 0.72 mg/dL (ref 0.61–1.24)
GFR, Estimated: >60 mL/min (ref >60)
Glucose, Bld: 135 mg/dL — ABNORMAL HIGH (ref 70–99)
Potassium: 4.1 mmol/L (ref 3.5–5.1)
Sodium: 135 mmol/L (ref 135–145)
Total Bilirubin: <0.2 mg/dL (ref 0.0–1.2)
Total Protein: 6.2 g/dL — ABNORMAL LOW (ref 6.5–8.1)

## 2024-08-16 LAB — GLUCOSE, CAPILLARY
Glucose-Capillary: 113 mg/dL — ABNORMAL HIGH (ref 70–99)
Glucose-Capillary: 117 mg/dL — ABNORMAL HIGH (ref 70–99)
Glucose-Capillary: 125 mg/dL — ABNORMAL HIGH (ref 70–99)
Glucose-Capillary: 133 mg/dL — ABNORMAL HIGH (ref 70–99)
Glucose-Capillary: 139 mg/dL — ABNORMAL HIGH (ref 70–99)

## 2024-08-16 LAB — PHOSPHORUS: Phosphorus: 3.4 mg/dL (ref 2.5–4.6)

## 2024-08-16 LAB — CBC
HCT: 22.6 % — ABNORMAL LOW (ref 39.0–52.0)
Hemoglobin: 7.4 g/dL — ABNORMAL LOW (ref 13.0–17.0)
MCH: 29.6 pg (ref 26.0–34.0)
MCHC: 32.7 g/dL (ref 30.0–36.0)
MCV: 90.4 fL (ref 80.0–100.0)
Platelets: 157 K/uL (ref 150–400)
RBC: 2.5 MIL/uL — ABNORMAL LOW (ref 4.22–5.81)
RDW: 17.4 % — ABNORMAL HIGH (ref 11.5–15.5)
WBC: 3.2 K/uL — ABNORMAL LOW (ref 4.0–10.5)
nRBC: 0 % (ref 0.0–0.2)

## 2024-08-16 LAB — MAGNESIUM: Magnesium: 1.7 mg/dL (ref 1.7–2.4)

## 2024-08-16 MED ORDER — PANTOPRAZOLE SODIUM 40 MG PO PACK: 40.0000 mg | PACK | Freq: Every day | ORAL | 0 refills | Status: DC

## 2024-08-16 MED ORDER — HEPARIN SOD (PORK) LOCK FLUSH 100 UNIT/ML IV SOLN
INTRAVENOUS | Status: AC
Start: 2024-08-16 — End: 2024-08-16
  Administered 2024-08-16: 500 [IU]
  Filled 2024-08-16: qty 5

## 2024-08-16 MED ORDER — LOPERAMIDE HCL 1 MG/7.5ML PO SUSP: 2.0000 mg | ORAL | 0 refills | Status: AC | PRN

## 2024-08-16 NOTE — TOC Initial Note (Addendum)
 Transition of Care [facility]) - Initial/Assessment Note    Patient Details  Name: Gilbert Thompson MRN: [id number] Date of Birth: 07-27-1951  [facility] CM/SW Contact:    Toy LITTIE Agar, RN Phone Number:[phone]  08/16/2024, 10:25 AM  Clinical Narrative:                 Inpatient care manager following patient for readmission. Patient admitted for diarrhea, extremity swelling. [facility] ids following for tube feeds. Holley Herring currently at bedside providing teaching for tube feeding pump. Per Pam if needed [facility] can provide a pump at discharge. They will need new orders. Patient currently has [facility] services and will continue at discharge.   1058 Orders have been signed and given to Pam with [facility].  Angie with [facility] made aware of patient discharge today.   Expected Discharge Plan: Home w Home Health Services Barriers to Discharge: Continued Medical Work up   Patient Goals and CMS Choice Patient states their goals for this hospitalization and ongoing recovery are:: Home with home health          Expected Discharge Plan and Services In-house Referral: NA Discharge Planning Services: CM Consult Post Acute Care Choice: Home Health (will resume home health services with [facility]) Living arrangements for the past 2 months: Single Family Home                 DME Arranged: Tube feeding (Active with [facility]) DME Agency: Other - Comment (Active with [facility]) Date DME Agency Contacted: 08/16/24 Time DME Agency Contacted: 1020 Representative spoke with at DME Agency: Holley Herring HH Arranged: RN, PT, OT [facility] Agency: Other - See comment (Active with [facility]) Date [facility] Agency Contacted: 08/16/24 Time HH Agency Contacted: 1021 Representative spoke with at [facility] Agency: Angie  Prior Living Arrangements/Services Living arrangements for the past 2 months: Single Family Home Lives with:: Spouse Patient language and need for interpreter reviewed::  Yes Do you feel safe going back to the place where you live?: Yes      Need for Family Participation in Patient Care: Yes (Comment) Care giver support system in place?: Yes (comment) Current home services: Home OT, Home PT, Home RN Criminal Activity/Legal Involvement Pertinent to Current Situation/Hospitalization: No - Comment as needed  Activities of Daily Living   ADL Screening (condition at time of admission) Independently performs ADLs?: Yes (appropriate for developmental age) Is the patient deaf or have difficulty hearing?: No Does the patient have difficulty seeing, even when wearing glasses/contacts?: No Does the patient have difficulty concentrating, remembering, or making decisions?: No  Permission Sought/Granted Permission sought to share information with : Family Supports Permission granted to share information with : Yes, Verbal Permission Granted  Share Information with NAME: Arnez Stoneking [phone]  Permission granted to share info w AGENCY: [facility]  Permission granted to share info w Relationship: spouse  Permission granted to share info w Contact Information: [phone]  Emotional Assessment Appearance:: Appears stated age Attitude/Demeanor/Rapport: Gracious Affect (typically observed): Pleasant, Quiet Orientation: : Oriented to Self, Oriented to Place, Oriented to  Time, Oriented to Situation Alcohol / Substance Use: Not Applicable Psych Involvement: No (comment)  Admission diagnosis:  Peripheral edema [R60.0] HCAP (healthcare-associated pneumonia) [J18.9] Diarrhea, unspecified type [R19.7] Patient Active Problem List   Diagnosis Date Noted   Malnutrition of moderate degree 08/16/2024   Diarrhea 08/15/2024   Antineoplastic chemotherapy induced pancytopenia 08/15/2024   HCAP (healthcare-associated pneumonia) 08/14/2024   Sepsis (HCC) 08/11/2024   Protein-calorie malnutrition,  severe 08/09/2024   Sepsis due to undetermined organism (HCC) 08/07/2024    Neutropenia with fever 08/07/2024   Anemia 08/07/2024   Hyponatremia 08/07/2024   Moderate protein malnutrition 08/07/2024   Transaminitis 08/07/2024   Oral thrush 08/07/2024   Multifocal pneumonia 08/07/2024   Sinus tachycardia 08/07/2024   Hypomagnesemia 08/07/2024   Hypophosphatemia 08/07/2024   Neutropenia 08/07/2024   Leukopenia due to antineoplastic chemotherapy 07/26/2024   Cancer related pain 06/08/2024   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 [place] [address] Phone: [phone] Fax: [phone]     Social Drivers of Health (SDOH) Social History: SDOH Screenings   Food Insecurity: No Food Insecurity (08/14/2024)  Housing: Low Risk  (08/14/2024)  Transportation Needs: No Transportation Needs (08/14/2024)  Utilities: Not At Risk (08/14/2024)  Alcohol Screen: Low Risk  (02/24/2023)  Depression (PHQ2-9): Low Risk  (08/07/2024)  Social Connections: Moderately Isolated (08/14/2024)  Tobacco Use: Low Risk  (08/14/2024)   SDOH Interventions:     Readmission Risk Interventions    08/16/2024   10:11 AM 08/10/2024    3:54 PM  Readmission Risk Prevention Plan  Transportation Screening Complete Complete  PCP or Specialist Appt within 3-5 Days  Complete  HRI or Home Care Consult  Complete  Social Work Consult for Recovery Care Planning/Counseling  Complete  Palliative Care Screening  Not Applicable  Medication Review [employer]) Referral to Pharmacy Referral to Pharmacy  PCP or Specialist appointment within 3-5 days of discharge Complete   HRI or Home Care Consult Complete    SW Recovery Care/Counseling Consult Complete   Palliative Care Screening Not Applicable   Skilled Nursing Facility Not Applicable

## 2024-08-16 NOTE — Plan of Care (Signed)

## 2024-08-16 NOTE — Discharge Summary (Addendum)
 Physician Discharge Summary   Patient: Gilbert Thompson MRN: [id number] DOB: 16-Aug-1951  Admit date:     08/14/2024  Discharge date: 08/16/24  Discharge Physician: Carliss LELON Canales   PCP: Windy Coy, MD   Recommendations at discharge:    Pt to be discharged home with home health.   If you experience worsening fever, chills, chest pain, shortness of breath, or other concerning symptoms, please call your PCP or go to the emergency department immediately.  Discharge Diagnoses: Principal Problem:   HCAP (healthcare-associated pneumonia) Active Problems:   Oropharyngeal cancer (HCC)   Anemia   Diarrhea   Antineoplastic chemotherapy induced pancytopenia   Malnutrition of moderate degree  Resolved Problems:   * No resolved hospital problems. *   Hospital Course:  73 year old with history of asthma, prostate cancer, CAD, GERD, carcinoma of oropharynx completed chemo and now on radiation and recently hospitalized for sepsis secondary to pneumonia coming to the hospital for diarrhea, weakness and lower extremity edema from the cancer center. Upon admission started on broad-spectrum antibiotic. Suspect his diarrhea is secondary to tube feeds but given his history, C. difficile and GI panel has been sent.   Assessment and Plan:  Diarrhea - Initial concern for C. difficile.  C. difficile positive however toxin negative.  Diarrhea has resolved on its own.  Patient feeling improved, eager for discharge home.  Will give prescription for liquid loperamide  to take as needed.  Etiology of diarrhea likely multifactorial given antibiotic use, tube feeds, radiation.  Chronic dysphagia/moderate protein calorie malnutrition - Evaluated by dietary.  Recommend Osmolite 1.5 at 65 mL/h via G-tube.  Banatrol fiber supplement 3 times daily.  Working with TOC on equipment to provide at discharge.  Multifocal pneumonia (POA) - Was on broad-spectrum antibiotics at previous admission, transition to  Augmentin  upon discharge.  Patient to resume and complete Augmentin .  Primary squamous cell carcinoma of the oropharynx - Completed chemotherapy.  Received radiation yesterday 11/25.  Eager to be discharged home.   Consultants: Oncology Procedures performed: None Disposition: Home Diet recommendation:  Discharge Diet Orders (From admission, onward)     Start     Ordered   08/16/24 0000  Diet - low sodium heart healthy        08/16/24 1036           NPO tube feeds  DISCHARGE MEDICATION: Allergies as of 08/16/2024   No Known Allergies      Medication List     PAUSE taking these medications    aspirin  EC 81 MG tablet Wait to take this until your doctor or other care provider tells you to start again. Take 81 mg by mouth daily. Swallow whole.   ezetimibe  10 MG tablet Wait to take this until your doctor or other care provider tells you to start again. Commonly known as: ZETIA  TAKE 1 TABLET BY MOUTH DAILY   rosuvastatin  40 MG tablet Wait to take this until your doctor or other care provider tells you to start again. Commonly known as: CRESTOR  Take 1 tablet (40 mg total) by mouth daily.       STOP taking these medications    dexamethasone  4 MG tablet Commonly known as: DECADRON    lidocaine -prilocaine  cream Commonly known as: EMLA    loratadine  10 MG tablet Commonly known as: CLARITIN    nystatin  100000 UNIT/ML suspension Commonly known as: MYCOSTATIN        TAKE these medications    acetaminophen  325 MG tablet Commonly known as: TYLENOL  Take 2 tablets (650  mg total) by mouth every 6 (six) hours as needed for mild pain (pain score 1-3) or fever (or Fever >/= 101). What changed: how to take this   albuterol  108 (90 Base) MCG/ACT inhaler Commonly known as: VENTOLIN  HFA Inhale 1-2 puffs into the lungs 3 (three) times daily for 5 days, THEN 1-2 puffs every 4 (four) hours as needed for wheezing or shortness of breath. Start taking on: August 12, 2024    allopurinol  300 MG tablet Commonly known as: ZYLOPRIM  Place 300 mg into feeding tube daily.   ALPRAZolam  1 MG tablet Commonly known as: XANAX  Place 1 mg into feeding tube daily as needed for anxiety or sleep.   amoxicillin -clavulanate 875-125 MG tablet Commonly known as: AUGMENTIN  Take 1 tablet by mouth every 12 (twelve) hours for 4 days. What changed: how to take this   antiseptic oral rinse solution Commonly known as: CORINZ 7 mLs by Mouth Rinse route as needed (for a dry mouth).   finasteride  5 MG tablet Commonly known as: PROSCAR  5 mg See admin instructions. 5 mg per tube once a day   fluticasone  50 MCG/ACT nasal spray Commonly known as: FLONASE  Place 2 sprays into both nostrils daily for 7 days.   HYDROcodone -acetaminophen  5-325 MG tablet Commonly known as: NORCO/VICODIN Place 1 tablet into feeding tube every 6 (six) hours.   lidocaine  2 % solution Commonly known as: XYLOCAINE  Patient: Mix 1part 2% viscous lidocaine , 1part H20. Swish & swallow 10mL of diluted mixture, before meals and at bedtime, up to QID   loperamide  HCl 1 MG/7.5ML suspension Commonly known as: IMODIUM  Place 15 mLs (2 mg total) into feeding tube as needed for diarrhea or loose stools.   magic mouthwash (nystatin , lidocaine , diphenhydrAMINE , alum & mag hydroxide) suspension Swish and swallow 5 mLs by mouth 4 (four) times daily as needed for mouth pain. Suspension contains equal amounts of Maalox Extra Strength, nystatin , diphenhydramine  and lidocaine . Swish and Swallow What changed: when to take this   morphine  30 MG 12 hr tablet Commonly known as: MS CONTIN  Take 1 tablet (30 mg total) by mouth every 12 (twelve) hours. What changed:  when to take this additional instructions   nitroGLYCERIN  0.4 MG SL tablet Commonly known as: NITROSTAT  Place 0.4 mg under the tongue every 5 (five) minutes as needed for chest pain.   nitroGLYCERIN  0.4 MG SL tablet Commonly known as: NITROSTAT  Place 1  tablet (0.4 mg total) under the tongue every 5 (five) minutes as needed for chest pain.   ondansetron  8 MG tablet Commonly known as: Zofran  Take 1 tablet (8 mg total) by mouth every 8 (eight) hours as needed for nausea or vomiting. Start on the third day after cisplatin . What changed: how to take this   Oxycodone  HCl 10 MG Tabs Take 1 tablet (10 mg total) by mouth every 6 (six) hours as needed. What changed:  how much to take how to take this when to take this   pantoprazole  40 MG tablet Commonly known as: Protonix  Take 1 tablet (40 mg total) by mouth 2 (two) times daily. What changed:  when to take this additional instructions   polyethylene glycol 17 g packet Commonly known as: MIRALAX  / GLYCOLAX  Place 17 g into feeding tube daily as needed for moderate constipation.   prochlorperazine  10 MG tablet Commonly known as: COMPAZINE  Take 1 tablet (10 mg total) by mouth every 6 (six) hours as needed (Nausea or vomiting). What changed: how to take this   Trelegy Ellipta 100-62.5-25  MCG/ACT Aepb Generic drug: Fluticasone -Umeclidin-Vilant Inhale 1 puff into the lungs daily.         Discharge Exam: Filed Weights   08/14/24 1224 08/15/24 2031 08/16/24 0721  Weight: 80.7 kg 81.2 kg 81.1 kg    GENERAL:  Alert, pleasant, no acute distress  HEENT:  EOMI CARDIOVASCULAR:  RRR, no murmurs appreciated RESPIRATORY:  Clear to auscultation, no wheezing, rales, or rhonchi GASTROINTESTINAL: G-tube, soft, nontender, nondistended EXTREMITIES:  No LE edema bilaterally NEURO:  No new focal deficits appreciated SKIN:  No rashes noted PSYCH:  Appropriate mood and affect     Condition at discharge: improving  The results of significant diagnostics from this hospitalization (including imaging, microbiology, ancillary and laboratory) are listed below for reference.   Imaging Studies: ECHOCARDIOGRAM COMPLETE Result Date: 08/15/2024    ECHOCARDIOGRAM REPORT   Patient Name:   Gilbert Thompson Date of Exam: 08/15/2024 Medical Rec #:  [id number]       Height:       69.0 in Accession #:    [id number]      Weight:       177.9 lb Date of Birth:  1951-04-14       BSA:          1.966 m Patient Age:    73 years        BP:           113/68 mmHg Patient Gender: M               HR:           93 bpm. Exam Location:  Inpatient Procedure: 2D Echo, 3D Echo, Color Doppler, Cardiac Doppler and Strain Analysis            (Both Spectral and Color Flow Doppler were utilized during            procedure). Indications:    CHF- Acute Diastolic I50.31  History:        Patient has prior history of Echocardiogram examinations, most                 recent 05/04/2022. CAD, hx of cancer, Signs/Symptoms:Shortness of                 Breath and Dyspnea; Risk Factors:Hypertension and Dyslipidemia.  Sonographer:    Koleen Popper RDCS Referring Phys: [id number] MIR M [facility]  Sonographer Comments: No subcostal window. Global longitudinal strain was attempted. IMPRESSIONS  1. Left ventricular ejection fraction, by estimation, is 60 to 65%. The left ventricle has normal function. The left ventricle has no regional wall motion abnormalities. There is mild concentric left ventricular hypertrophy. Indeterminate diastolic filling due to E-A fusion.  2. Right ventricular systolic function is normal. The right ventricular size is normal.  3. The mitral valve is grossly normal. No evidence of mitral valve regurgitation. No evidence of mitral stenosis.  4. The aortic valve is tricuspid. There is mild calcification of the aortic valve. Aortic valve regurgitation is not visualized. Aortic valve sclerosis is present, with no evidence of aortic valve stenosis. FINDINGS  Left Ventricle: Left ventricular ejection fraction, by estimation, is 60 to 65%. The left ventricle has normal function. The left ventricle has no regional wall motion abnormalities. Global longitudinal strain performed but not reported based on interpreter judgement due to  suboptimal tracking. 3D ejection fraction reviewed and evaluated as part of the interpretation. Alternate measurement of EF is felt to be most reflective of LV function. The left ventricular  internal cavity size was normal in  size. There is mild concentric left ventricular hypertrophy. Indeterminate diastolic filling due to E-A fusion. Right Ventricle: The right ventricular size is normal. No increase in right ventricular wall thickness. Right ventricular systolic function is normal. Left Atrium: Left atrial size was normal in size. Right Atrium: Right atrial size was normal in size. Pericardium: Trivial pericardial effusion is present. Presence of epicardial fat layer. Mitral Valve: The mitral valve is grossly normal. No evidence of mitral valve regurgitation. No evidence of mitral valve stenosis. Tricuspid Valve: The tricuspid valve is grossly normal. Tricuspid valve regurgitation is trivial. No evidence of tricuspid stenosis. Aortic Valve: The aortic valve is tricuspid. There is mild calcification of the aortic valve. Aortic valve regurgitation is not visualized. Aortic valve sclerosis is present, with no evidence of aortic valve stenosis. Pulmonic Valve: The pulmonic valve was grossly normal. Pulmonic valve regurgitation is not visualized. No evidence of pulmonic stenosis. Aorta: The aortic root and ascending aorta are structurally normal, with no evidence of dilitation. Venous: The inferior vena cava was not well visualized. IAS/Shunts: The atrial septum is grossly normal. Additional Comments: 3D was performed not requiring image post processing on an independent workstation and was indeterminate.  LEFT VENTRICLE PLAX 2D LVIDd:         4.30 cm      Diastology LVIDs:         2.70 cm      LV e' medial:    7.07 cm/s LV PW:         1.10 cm      LV E/e' medial:  12.6 LV IVS:        1.20 cm      LV e' lateral:   11.00 cm/s LVOT diam:     2.20 cm      LV E/e' lateral: 8.1 LV SV:         69 LV SV Index:   35 LVOT  Area:     3.80 cm                              3D Volume EF: LV Volumes (MOD)            3D EF:        60 % LV vol d, MOD A2C: 110.0 ml LV EDV:       155 ml LV vol s, MOD A2C: 41.2 ml  LV ESV:       62 ml LV SV MOD A2C:     68.8 ml  LV SV:        93 ml RIGHT VENTRICLE RV Basal diam:  3.90 cm RV S prime:     15.30 cm/s TAPSE (M-mode): 2.0 cm LEFT ATRIUM             Index        RIGHT ATRIUM           Index LA diam:        4.00 cm 2.03 cm/m   RA Area:     13.00 cm LA Vol (A2C):   38.8 ml 19.74 ml/m  RA Volume:   30.10 ml  15.31 ml/m LA Vol (A4C):   36.5 ml 18.57 ml/m LA Biplane Vol: 37.8 ml 19.23 ml/m  AORTIC VALVE LVOT Vmax:   101.00 cm/s LVOT Vmean:  67.900 cm/s LVOT VTI:    0.181 m  AORTA Ao Root diam: 3.60 cm  Ao Asc diam:  3.80 cm MITRAL VALVE                TRICUSPID VALVE MV Area (PHT): 3.65 cm     TR Peak grad:   15.5 mmHg MV Decel Time: 208 msec     TR Vmax:        197.00 cm/s MV E velocity: 88.90 cm/s MV A velocity: 132.00 cm/s  SHUNTS MV E/A ratio:  0.67         Systemic VTI:  0.18 m                             Systemic Diam: 2.20 cm Darryle Decent MD Electronically signed by Darryle Decent MD Signature Date/Time: 08/15/2024/9:48:46 AM    Final    UE VENOUS DUPLEX (7am - 7pm) Result Date: 08/14/2024 UPPER VENOUS STUDY  Patient Name:  Gilbert Thompson  Date of Exam:   08/14/2024 Medical Rec #: [id number]        Accession #:    [id number] Date of Birth: [birth year]        Patient Gender: M Patient Age:   50 years Exam Location:  [facility] Procedure:      VAS US  UPPER EXTREMITY VENOUS DUPLEX Referring Phys: ADAM CURATOLO --------------------------------------------------------------------------------  Indications: Swelling Risk Factors: Primary squamous cell carcinoma of oropharynx, chemotherapy/radiation. Limitations: Body habitus and poor ultrasound/tissue interface. Comparison Study: No previous exams Performing Technologist: Alberta Lis RVS  Examination Guidelines: A complete evaluation  includes B-mode imaging, spectral Doppler, color Doppler, and power Doppler as needed of all accessible portions of each vessel. Bilateral testing is considered an integral part of a complete examination. Limited examinations for reoccurring indications may be performed as noted.  Right Findings: +----------+------------+---------+-----------+----------+-----------------+ RIGHT     CompressiblePhasicitySpontaneousProperties     Summary      +----------+------------+---------+-----------+----------+-----------------+ Subclavian               Yes       Yes              patent by doppler +----------+------------+---------+-----------+----------+-----------------+  Left Findings: +----------+------------+---------+-----------+----------+--------------------+ LEFT      CompressiblePhasicitySpontaneousProperties      Summary        +----------+------------+---------+-----------+----------+--------------------+ IJV                      Yes       Yes                   patent by                                                              color/doppler     +----------+------------+---------+-----------+----------+--------------------+ Subclavian               Yes       Yes                   patent by  color/doppler     +----------+------------+---------+-----------+----------+--------------------+ Axillary                 Yes       Yes                   patent by                                                              color/doppler     +----------+------------+---------+-----------+----------+--------------------+ Brachial      Full       Yes       Yes                                   +----------+------------+---------+-----------+----------+--------------------+ Radial        Full                                                        +----------+------------+---------+-----------+----------+--------------------+ Ulnar         Full                                                       +----------+------------+---------+-----------+----------+--------------------+ Cephalic      Full                                                       +----------+------------+---------+-----------+----------+--------------------+ Basilic                                                Not visualized    +----------+------------+---------+-----------+----------+--------------------+ enlarged lymph nodes seen in neck (left side).  Summary:  Right: No evidence of thrombosis in the subclavian.  Left: No evidence of deep vein thrombosis in the upper extremity. No evidence of superficial vein thrombosis in the upper extremity. However, unable to visualize the basilic vein. Subcutaneous edema throughout upper extremity.  *See table(s) above for measurements and observations.  Diagnosing physician: Lonni Gaskins MD Electronically signed by Lonni Gaskins MD on 08/14/2024 at 4:59:56 PM.    Final    CT Angio Chest PE W and/or Wo Contrast Result Date: 08/14/2024 CLINICAL DATA:  Weakness, fever, diarrhea, head and neck cancer EXAM: CT ANGIOGRAPHY CHEST WITH CONTRAST TECHNIQUE: Multidetector CT imaging of the chest was performed using the standard protocol during bolus administration of intravenous contrast. Multiplanar CT image reconstructions and MIPs were obtained to evaluate the vascular anatomy. RADIATION DOSE REDUCTION: This exam was performed according to the departmental dose-optimization program which includes automated exposure control, adjustment of the mA and/or kV according to patient size and/or use of iterative reconstruction technique. CONTRAST:  75mL OMNIPAQUE  IOHEXOL  350 MG/ML SOLN COMPARISON:  08/07/2024 FINDINGS: Cardiovascular: This is a technically adequate evaluation of the pulmonary vasculature. No filling defects or  pulmonary emboli. The heart is unremarkable without pericardial effusion. No evidence of thoracic aortic aneurysm or dissection. Atherosclerosis of the aorta and coronary vasculature. Right chest wall port via internal jugular approach tip within the superior vena cava. Mediastinum/Nodes: Right hilar adenopathy measuring up to 1.3 cm in short axis. No other pathologic adenopathy within the mediastinum or axillary regions. Thyroid , trachea, and esophagus are unremarkable. Lungs/Pleura: Since the previous exam, there has been progression of the multifocal bilateral airspace disease, greatest in the right upper lobe. Interval development of trace bilateral pleural effusions. No pneumothorax. The central airways are patent. Upper Abdomen: Percutaneous gastrostomy tube within the lumen of the gastric antrum. No acute upper abdominal findings. Musculoskeletal: No acute or destructive bony abnormalities. Reconstructed images demonstrate no additional findings. Review of the MIP images confirms the above findings. IMPRESSION: 1. No evidence of pulmonary embolus. 2. Progressive multifocal bilateral airspace disease, greatest in the right upper lobe, consistent with bilateral bronchopneumonia. 3. Trace bilateral parapneumonic effusions. 4. Stable right hilar lymphadenopathy. 5. Aortic Atherosclerosis (ICD10-I70.0). Coronary artery atherosclerosis. The Electronically Signed   By: Ozell Daring M.D.   On: 08/14/2024 16:08   DG Chest Portable 1 View Result Date: 08/14/2024 CLINICAL DATA:  Shortness of breath. EXAM: PORTABLE CHEST 1 VIEW COMPARISON:  Chest radiograph dated 08/07/2024. FINDINGS: Right-sided Port-A-Cath with tip at the cavoatrial junction. Right mid to lower lung field hazy density may represent atelectasis or pneumonia. No pleural effusion pneumothorax. Stable cardiac silhouette. No acute osseous pathology. IMPRESSION: Right mid to lower lung field atelectasis or pneumonia. Electronically Signed   By: Vanetta Chou M.D.   On: 08/14/2024 13:10   US  Abdomen Limited RUQ (LIVER/GB) Result Date: 08/11/2024 CLINICAL DATA:  Transaminitis EXAM: ULTRASOUND ABDOMEN LIMITED RIGHT UPPER QUADRANT COMPARISON:  PET-CT February 22, 2023. FINDINGS: Gallbladder: No gallstones or wall thickening visualized. No sonographic Murphy sign noted by sonographer. Common bile duct: Diameter: 4 mm Liver: No focal lesion identified. Normal parenchymal echogenicity. Portal vein is patent on color Doppler imaging with normal direction of blood flow towards the liver. Other: Incidental note was made of right-sided pleural effusion. IMPRESSION: No focal liver lesion. Right small pleural effusion. Electronically Signed   By: Megan  Zare M.D.   On: 08/11/2024 14:45   DG Swallowing Func-Speech Pathology Result Date: 08/09/2024 Table formatting from the original result was not included. Modified Barium Swallow Study Patient Details Name: Gilbert Thompson MRN: [id number] Date of Birth: 04/16/1951 Today's Date: 08/09/2024 HPI/PMH: HPI: pt is a 73 yo male adm to [facility] with hypotension, poor intake.  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 He is undergoing concurrent chemoradiation. Last chemotherapy with cisplatin  cycle 5 was on 07/26/2024. Further chemotherapy was withheld because of neutropenia and has been receiving Zarxio  support in our clinic. Pt received a PEG tube for nutrition and reports having consumed po and using feeding tube for nutrition. 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  He admits odynophagia and dysphagia are equal parts responsible for his poor intake. Pt CXR 08/07/2024: Patchy airspace opacities, especially in the lower lobes, but also in the  right upper lobe  and right middle lobe potentially from atypical or multilobar pneumonia. Clinical Impression: Clinical Impression: Patient presents with mild oropharyngeal dysphagia likely due to his oropharyngeal mass, iatrogenic effects of XRT and his bilateral vocal fold atrophy.  These combined issues with edema result in decreased airway closure allowing trace aspiration of thin liquid with nonproductive cough response.  Aspiration noted most especially occuring early in the study - as barium spilled into open airway prior to swallow being triggered.  SLP questions some component of warm up effect. Further episodes of laryngeal penetration were inconsistent and only trace.  Mild retention noted in pharynx (more at tongue base with solids) post-swallow due to decreased tongue base retraction and inadequate mastication. Pt c/o severe xerostomia causing foods to adhere to his oral structures  No aspiration or penetration with cracker, puree or honey thick liquids.   Pharyngeal clearance with puree was excellent, likely due to combined weight of bolus allowing improved neuro input.  Various postures were not helpful to mitigate retention or aspiration - including chin tuck *tucked approx 20* due to neck stiffness* and head turn left.  Liquid swallows are more effective than dry swallows which are not really functional.  At this time, recommend pt be able to start po diet with strict precautions.  If pt is reflexively coughing during intake, it is likely due to aspiration.  Given he has PEG tube for nutrition, recommend close monitoring of po diet via temperatures, lung sound monitoring, etc. Expect swallow to improve several week after XRT is completed. Factors that may increase risk of adverse event in presence of aspiration Noe & Lianne 2021): Factors that may increase risk of adverse event in presence of aspiration Noe & Lianne 2021): Reduced saliva; Poor general health and/or compromised immunity  Recommendations/Plan: Swallowing Evaluation Recommendations Swallowing Evaluation Recommendations Recommendations: Dysphagia 1 (Pureed);(EXTRA GRAVY/SAUCES - CREAMY PUREES!) Thin liquids (Level 0 )=  CONSIDER WATER  WITH MEALS Liquid Administration via: Cup; Straw Medication Administration: Crushed with puree (or via PEG) Swallowing strategies : Start intake with WATER ; Small bites/sips. Follow solid w/liquid. Stop intake if overtly coughing. Clear throat intermittently Treatment Plan Treatment Plan Treatment recommendations: Therapy as outlined in treatment plan below Follow-up recommendations: Follow physicians's recommendations for discharge plan and follow up therapies Functional status assessment: Patient has had a recent decline in their functional status and demonstrates the ability to make significant improvements in function in a reasonable and predictable amount of time. Treatment frequency: Min 1x/week Treatment duration: 1 week Interventions: Aspiration precaution training; Patient/family education; Oropharyngeal exercises; Respiratory muscle strength training Recommendations Recommendations for follow up therapy are one component of a multi-disciplinary discharge planning process, led by the attending physician.  Recommendations may be updated based on patient status, additional functional criteria and insurance authorization. Assessment: Orofacial Exam: Orofacial Exam Oral Cavity: Oral Hygiene: Xerostomia; Lingual coating; Edema; Erythema Oral Cavity - Dentition: Adequate natural dentition Orofacial Anatomy: Other (comment) (pt has known left tonsillar mass close to uvula and down to glossotonsillar sulcus) Anatomy: Anatomy: Suspected cervical osteophytes Boluses Administered: Boluses Administered Boluses Administered: Thin liquids (Level 0); Mildly thick liquids (Level 2, nectar thick); Puree; Moderately thick liquids (Level 3, honey thick); Solid  Oral Impairment Domain: Oral Impairment Domain Lip  Closure: No labial escape Tongue control during bolus hold: Cohesive bolus between tongue to palatal seal Bolus preparation/mastication: Minimal chewing/mashing with majority of bolus unchewed Bolus transport/lingual motion: Slow tongue motion Oral residue: Trace residue lining oral structures Location of oral residue : Palate; Tongue  Initiation of pharyngeal swallow : Posterior laryngeal surface of the epiglottis; Pyriform sinuses; Valleculae  Pharyngeal Impairment Domain: Pharyngeal Impairment Domain Soft palate elevation: No bolus between soft palate (SP)/pharyngeal wall (PW) Laryngeal elevation: Partial superior movement of thyroid  cartilage/partial approximation of arytenoids to epiglottic petiole Anterior hyoid excursion: Partial anterior movement Epiglottic movement: Partial inversion Laryngeal vestibule closure: Incomplete, narrow column air/contrast in laryngeal vestibule Pharyngeal stripping wave : Present - diminished Pharyngeal contraction (A/P view only): N/A Pharyngoesophageal segment opening: Partial distention/partial duration, partial obstruction of flow Tongue base retraction: Narrow column of contrast or air between tongue base and PPW Pharyngeal residue: Collection of residue within or on pharyngeal structures; Trace residue within or on pharyngeal structures Location of pharyngeal residue: Tongue base; Valleculae (tongue base and vallecular more than pharyngeal wall and pyriform sinus)  Esophageal Impairment Domain: Esophageal Impairment Domain Esophageal clearance upright position: Esophageal retention Pill: Pill Consistency administered: -- (DNT) Penetration/Aspiration Scale Score: Penetration/Aspiration Scale Score 1.  Material does not enter airway: Moderately thick liquids (Level 3, honey thick); Puree; Solid 2.  Material enters airway, remains ABOVE vocal cords then ejected out: Mildly thick liquids (Level 2, nectar thick) 7.  Material enters airway, passes BELOW cords and not ejected out  despite cough attempt by patient: Thin liquids (Level 0) Compensatory Strategies: Compensatory Strategies Compensatory strategies: Yes Chin tuck: Ineffective Ineffective Chin Tuck: Thin liquid (Level 0) Liquid wash: Effective Left head turn: Ineffective Ineffective Left Head Turn: Mildly thick liquid (Level 2, nectar thick)   General Information: Caregiver present: Yes  Diet Prior to this Study: NPO; Other (Comment) (ice chips)   Temperature : Normal   Respiratory Status: WFL   Supplemental O2: None (Room air)   History of Recent Intubation: No  Behavior/Cognition: Alert; Cooperative; Pleasant mood Self-Feeding Abilities: Able to self-feed Baseline vocal quality/speech: Dysphonic Volitional Cough: Able to elicit Volitional Swallow: Able to elicit Exam Limitations: No limitations Goal Planning: Prognosis for improved oropharyngeal function: Good No data recorded No data recorded Patient/Family Stated Goal: He admits odynophagia and dysphagia are equal parts responsible for his poor intake. No data recorded Pain: Pain Assessment Pain Assessment: No/denies pain Faces Pain Scale: 2 Pain Location: oropharynx Pain Descriptors / Indicators: Aching; Burning Pain Intervention(s): Limited activity within patient's tolerance; Other (comment) (RN provided medication during session) End of Session: Start Time:SLP Start Time (ACUTE ONLY): 1210 Stop Time: SLP Stop Time (ACUTE ONLY): 1235 Time Calculation:SLP Time Calculation (min) (ACUTE ONLY): 25 min Charges: SLP Evaluations $ SLP Speech Visit: 1 Visit SLP Evaluations $BSS Swallow: 1 Procedure $MBS Swallow: 1 Procedure $Swallowing Treatment: 1 Procedure SLP visit diagnosis: SLP Visit Diagnosis: Dysphagia, oropharyngeal phase (R13.12) Past Medical History: Past Medical History: Diagnosis Date  Asthma   Cancer (HCC)   prostate cancer  Coronary artery disease   pt states they were unable to place a stent at that time  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, follwed by [facility]   Dyspnea   with exertion  GERD (gastroesophageal reflux disease)   Hyperlipidemia   Hypertension   Pneumonia  Past Surgical History: Past Surgical History: Procedure Laterality Date  CATARACT EXTRACTION Bilateral   IR GASTROSTOMY TUBE MOD SED  06/23/2024  IR IMAGING GUIDED PORT INSERTION  06/23/2024  LEFT HEART CATH AND CORONARY ANGIOGRAPHY N/A 06/05/2022  Procedure: LEFT HEART CATH AND CORONARY ANGIOGRAPHY;  Surgeon: Claudene Victory ORN, MD;  Location: [facility];  Service: Cardiovascular;  Laterality: N/A;  LYMPHADENECTOMY Bilateral 04/29/2023  Procedure: BILATERAL PELVIC LYMPHADENECTOMY;  Surgeon:  Renda Glance, MD;  Location: [facility] OR[facility];  Service: Urology;  Laterality: Bilateral;  PROSTATE BIOPSY    ROBOT ASSISTED LAPAROSCOPIC RADICAL PROSTATECTOMY N/A 04/29/2023  Procedure: XI ROBOTIC ASSISTED LAPAROSCOPIC RADICAL PROSTATECTOMY LEVEL 2;  Surgeon: Renda Glance, MD;  Location: [facility];  Service: Urology;  Laterality: N/A;  210 MINUTES NEEDED FOR CASE  UPPER GASTROINTESTINAL ENDOSCOPY    VENTRAL HERNIA REPAIR N/A 10/05/2023  Procedure: LAPAROSCOPIC VENTRAL HERNIA REPAAIR WITH MESH;  Surgeon: Rubin Calamity, MD;  Location: [facility] OR;  Service: General;  Laterality: N/A; [name], MS [facility] SLP Acute Rehab Services Office [phone] [name] 08/09/2024, 2:55 PM  CT Angio Chest PE W/Cm &/Or Wo Cm Result Date: 08/07/2024 EXAM: CTA CHEST 08/07/2024 01:30:55 PM TECHNIQUE: CTA of the chest was performed after the administration of intravenous contrast. Multiplanar reformatted images are provided for review. MIP images are provided for review. Automated exposure control, iterative reconstruction, and/or weight based adjustment of the mA/kV was utilized to reduce the radiation dose to as low as reasonably achievable. COMPARISON: None available. 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 * Tracking  Code: BO * right port a cath tip colon SVC. FINDINGS: PULMONARY ARTERIES: Pulmonary arteries are adequately opacified for evaluation. No acute pulmonary embolus. Main pulmonary artery is normal in caliber. MEDIASTINUM: Mild cardiomegaly. Thoracic aortic, coronary artery, and branch vessel atheromatous vascular calcifications. There is no acute abnormality of the thoracic aorta. LYMPH NODES: Right high lung node 1.3 cm in short axis on image 62 series 5, formerly 0.7 cm. No mediastinal or axillary lymphadenopathy. LUNGS AND PLEURA: Bilateral airway thickening noted. Patchy and nodular regions of airspace opacity in the right upper lobe, right middle lobe, and confluent in both lower lobes especially dependently raising the possibility of multilobar pneumonia. There is also some faint ground glass opacity posteriorly in the lingula. No evidence of pleural effusion or pneumothorax. UPPER ABDOMEN: Limited images of the upper abdomen are unremarkable. Peg tube noted. SOFT TISSUES AND BONES: Thoracic spondylosis. No acute bone or soft tissue abnormality. IMPRESSION: 1. No pulmonary embolism. 2. Patchy airspace opacities, especially in the lower lobes, but also in the right upper lobe and right middle lobe potentially from atypical or multilobar pneumonia. 3. Enlarged right high lung lymph node measuring 1.3 cm short axis, increased from 0.7 cm. This is likely reactive given the findings in the lung, although surveillance is suggested. 4. Mild cardiomegaly. 5. Thoracic aortic and coronary artery atherosclerosis. 6. Bilateral airway thickening. 7. Thoracic spondylosis. Electronically signed by: Ryan Salvage MD 08/07/2024 02:26 PM EST RP Workstation: [id number]   DG Chest Port 1 View Result Date: 08/07/2024 CLINICAL DATA:  Questionable sepsis. Head and neck cancer, hypotension with decreased O2 sats. EXAM: PORTABLE CHEST 1 VIEW COMPARISON:  09/28/2022.  CT chest 04/06/2024. FINDINGS: Right IJ Port-A-Cath tip is at  the SVC RA junction. Lungs are clear. No pleural fluid. IMPRESSION: No acute findings. Electronically Signed   By: Newell Eke M.D.   On: 08/07/2024 10:59    Microbiology: Results for orders placed or performed during the hospital encounter of 08/14/24  C Difficile Quick Screen w PCR reflex     Status: Abnormal   Collection Time: 08/15/24  7:00 AM   Specimen: STOOL  Result Value Ref Range Status   C Diff antigen POSITIVE (A) NEGATIVE Final   C Diff toxin NEGATIVE NEGATIVE Final   C Diff interpretation Results are indeterminate. See PCR  results.  Final    Comment: Performed at [facility], [address], [place], [place] [address]  C. Diff by PCR, Reflexed     Status: None   Collection Time: 08/15/24  7:00 AM  Result Value Ref Range Status   Toxigenic C. Difficile by PCR NEGATIVE NEGATIVE Final    Comment: Patient is colonized with non toxigenic C. difficile. May not need treatment unless significant symptoms are present.   Hypervirulent Strain PRESUMPTIVE NEGATIVE PRESUMPTIVE NEGATIVE Final    Comment: Performed at [facility], [address] [place], [place] [address]    Labs: CBC: Recent Labs  Lab 08/10/24 0553 08/11/24 0600 08/12/24 0444 08/14/24 1155 08/15/24 0400 08/16/24 0400  WBC 1.6* 2.6* 3.3* 5.1 3.2* 3.2*  NEUTROABS 1.1* 1.8 2.3 4.0  --   --   HGB 8.3* 7.8* 8.4* 9.6* 7.1* 7.4*  HCT 24.1* 22.3* 24.5* 28.3* 21.2* 22.6*  MCV 88.0 86.4 87.5 88.4 89.8 90.4  PLT 62* 69* 96* 133* 112* 157   Basic Metabolic Panel: Recent Labs  Lab 08/09/24 1731 08/09/24 1731 08/10/24 0553 08/11/24 0600 08/12/24 0444 08/14/24 1155 08/14/24 1400 08/15/24 0400 08/16/24 0400  NA  --    < > 135 133* 132* 132*  --  133* 135  K  --    < > 3.4* 3.5 3.3* 3.7  --  3.4* 4.1  CL  --    < > 102 100 96* 96*  --  99 101  CO2  --    < > 24 25 25 26   --  27 24  GLUCOSE  --    < > 132* 96 115* 112*  --  164* 135*  BUN  --    < > 21 16 18 18   --  16 13   CREATININE  --    < > 0.93 0.83 0.84 0.95  --  0.77 0.72  CALCIUM   --    < > 8.6* 8.2* 8.4* 9.0  --  8.4* 8.6*  MG 1.7  --  1.7 1.7 1.8  --  1.5*  --  1.7  PHOS 1.8*  --  2.5  --   --   --   --   --  3.4   < > = values in this interval not displayed.   Liver Function Tests: Recent Labs  Lab 08/10/24 0553 08/11/24 0930 08/12/24 0444 08/14/24 1155 08/16/24 0400  AST 114* 67* 72* 61* 74*  ALT 116* 90* 88* 69* 75*  ALKPHOS 69 67 84 96 95  BILITOT 0.4 0.5 0.4 0.4 <0.2  PROT 5.9* 5.8* 6.3* 7.2 6.2*  ALBUMIN 2.5* 2.4* 2.6* 2.6* 2.5*   CBG: Recent Labs  Lab 08/12/24 0800 08/12/24 1317 08/16/24 0005 08/16/24 0412 08/16/24 0724  GLUCAP 134* 130* 139* 133* 125*    Discharge time spent: 35 minutes.  Length of inpatient stay: 2 days  Signed: Carliss LELON Canales, DO [facility] 08/16/2024

## 2024-08-16 NOTE — Progress Notes (Signed)
 Nutrition Note  Patient discharging today. Expected long-term need of enteral nutrition.  Patient continues to tolerate current regimen well via continuous pump.  At discharge: -Recommend Nutren 1.5 @ 65 ml/hr via G-tube. -Provides 2371 kcals, 106g protein and 1191 ml H2O -Free water : 150 ml QID   Labs and medications reviewed.   Morna Lee, MS, RD, LDN Inpatient Clinical Dietitian Contact via Secure chat

## 2024-08-16 NOTE — TOC Transition Note (Addendum)
 Transition of Care [facility]) - Discharge Note   Patient Details  Name: Gilbert Thompson MRN: [id number] Date of Birth: Mar 05, 1951  Transition of [facility]) CM/SW Contact:  Toy LITTIE Agar, RN Phone Number:[phone]  08/16/2024, 11:43 AM   Clinical Narrative:    CM has confirmed with Pam with [facility] that patient is ready for discharge from her end. Angie with [facility] confirms that she will get patient scheduled for [facility] follow up. No other needs noted at this time. Inpatient care manager to sign off  1259 CM received call from Angie with [facility]. There is no RN to see the patient before Monday. [facility] has reached out to [facility] and [facility] to determine if they would have a nurse sooner. No agency is able to provide nursing on Friday Sat or Sun. Wife has been made aware and verbalizes understanding.      Barriers to Discharge: Continued Medical Work up   Patient Goals and CMS Choice Patient states their goals for this hospitalization and ongoing recovery are:: Home with home health          Discharge Placement                       Discharge Plan and Services Additional resources added to the After Visit Summary for   In-house Referral: NA Discharge Planning Services: CM Consult Post Acute Care Choice: Home Health (will resume home health services with [facility])          DME Arranged: Tube feeding (Active with [facility]) DME Agency: Other - Comment (Active with [facility]) Date DME Agency Contacted: 08/16/24 Time DME Agency Contacted: 1020 Representative spoke with at DME Agency: [facility] HH Arranged: RN, PT, OT [facility] Agency: Other - See comment (Active with [facility]) Date [facility] Agency Contacted: 08/16/24 Time HH Agency Contacted: 1021 Representative spoke with at [facility] Agency: Angie  Social Drivers of Health (SDOH) Interventions SDOH Screenings   Food Insecurity: No Food Insecurity (08/14/2024)  Housing: Low Risk  (08/14/2024)  Transportation Needs: No  Transportation Needs (08/14/2024)  Utilities: Not At Risk (08/14/2024)  Alcohol Screen: Low Risk  (02/24/2023)  Depression (PHQ2-9): Low Risk  (08/07/2024)  Social Connections: Moderately Isolated (08/14/2024)  Tobacco Use: Low Risk  (08/14/2024)     Readmission Risk Interventions    08/16/2024   10:11 AM 08/10/2024    3:54 PM  Readmission Risk Prevention Plan  Transportation Screening Complete Complete  PCP or Specialist Appt within 3-5 Days  Complete  HRI or Home Care Consult  Complete  Social Work Consult for Recovery Care Planning/Counseling  Complete  Palliative Care Screening  Not Applicable  Medication Review [facility]) Referral to Pharmacy Referral to Pharmacy  PCP or Specialist appointment within 3-5 days of discharge Complete   HRI or Home Care Consult Complete   SW Recovery Care/Counseling Consult Complete   Palliative Care Screening Not Applicable   Skilled Nursing Facility Not Applicable

## 2024-08-17 DIAGNOSIS — I10 Essential (primary) hypertension: Secondary | ICD-10-CM | POA: Diagnosis not present

## 2024-08-17 DIAGNOSIS — E440 Moderate protein-calorie malnutrition: Secondary | ICD-10-CM | POA: Diagnosis not present

## 2024-08-17 DIAGNOSIS — E876 Hypokalemia: Secondary | ICD-10-CM | POA: Diagnosis not present

## 2024-08-17 DIAGNOSIS — I2510 Atherosclerotic heart disease of native coronary artery without angina pectoris: Secondary | ICD-10-CM | POA: Diagnosis not present

## 2024-08-17 DIAGNOSIS — E871 Hypo-osmolality and hyponatremia: Secondary | ICD-10-CM | POA: Diagnosis not present

## 2024-08-17 DIAGNOSIS — J189 Pneumonia, unspecified organism: Secondary | ICD-10-CM | POA: Diagnosis not present

## 2024-08-17 DIAGNOSIS — Z9181 History of falling: Secondary | ICD-10-CM | POA: Diagnosis not present

## 2024-08-17 DIAGNOSIS — Z7982 Long term (current) use of aspirin: Secondary | ICD-10-CM | POA: Diagnosis not present

## 2024-08-17 DIAGNOSIS — C109 Malignant neoplasm of oropharynx, unspecified: Secondary | ICD-10-CM | POA: Diagnosis not present

## 2024-08-17 DIAGNOSIS — Z8546 Personal history of malignant neoplasm of prostate: Secondary | ICD-10-CM | POA: Diagnosis not present

## 2024-08-17 DIAGNOSIS — J45909 Unspecified asthma, uncomplicated: Secondary | ICD-10-CM | POA: Diagnosis not present

## 2024-08-18 ENCOUNTER — Telehealth: Payer: Self-pay

## 2024-08-18 NOTE — Telephone Encounter (Signed)
 Oral Oncology Patient Advocate Encounter  Prior Authorization for pantoprazole  sodium (PROTONIX ) 40 mg packet  has been approved.    PA# [id number] Effective dates: 09/22/23 through 09/20/24  I let [facility] know.   Lucie Lamer, CPhT [place]  [facility] Oncology Pharmacy Patient Advocate Specialist [facility] [place] Phone: [phone]  Fax: [phone] [email or web address]

## 2024-08-18 NOTE — Telephone Encounter (Signed)
 Oral Oncology Patient Advocate Encounter   Received notification that prior authorization for pantoprazole  sodium (PROTONIX ) 40 mg packet is required.   PA submitted on 08/18/24 Key [id number] Status is pending     Lucie Lamer, CPhT [place]  [facility] Oncology Pharmacy Patient Advocate Specialist [facility] [place] Phone: [phone]  Fax: [phone] [email or web address]

## 2024-08-21 ENCOUNTER — Other Ambulatory Visit: Payer: Self-pay | Admitting: Oncology

## 2024-08-21 DIAGNOSIS — C109 Malignant neoplasm of oropharynx, unspecified: Secondary | ICD-10-CM

## 2024-08-22 DIAGNOSIS — Z8546 Personal history of malignant neoplasm of prostate: Secondary | ICD-10-CM | POA: Diagnosis not present

## 2024-08-22 DIAGNOSIS — J45909 Unspecified asthma, uncomplicated: Secondary | ICD-10-CM | POA: Diagnosis not present

## 2024-08-22 DIAGNOSIS — E871 Hypo-osmolality and hyponatremia: Secondary | ICD-10-CM | POA: Diagnosis not present

## 2024-08-22 DIAGNOSIS — E440 Moderate protein-calorie malnutrition: Secondary | ICD-10-CM | POA: Diagnosis not present

## 2024-08-22 DIAGNOSIS — E876 Hypokalemia: Secondary | ICD-10-CM | POA: Diagnosis not present

## 2024-08-22 DIAGNOSIS — Z9181 History of falling: Secondary | ICD-10-CM | POA: Diagnosis not present

## 2024-08-22 DIAGNOSIS — I2510 Atherosclerotic heart disease of native coronary artery without angina pectoris: Secondary | ICD-10-CM | POA: Diagnosis not present

## 2024-08-22 DIAGNOSIS — J189 Pneumonia, unspecified organism: Secondary | ICD-10-CM | POA: Diagnosis not present

## 2024-08-22 DIAGNOSIS — I10 Essential (primary) hypertension: Secondary | ICD-10-CM | POA: Diagnosis not present

## 2024-08-22 DIAGNOSIS — C109 Malignant neoplasm of oropharynx, unspecified: Secondary | ICD-10-CM | POA: Diagnosis not present

## 2024-08-22 DIAGNOSIS — Z7982 Long term (current) use of aspirin: Secondary | ICD-10-CM | POA: Diagnosis not present

## 2024-08-23 DIAGNOSIS — D61810 Antineoplastic chemotherapy induced pancytopenia: Secondary | ICD-10-CM | POA: Diagnosis not present

## 2024-08-23 DIAGNOSIS — J4530 Mild persistent asthma, uncomplicated: Secondary | ICD-10-CM | POA: Diagnosis not present

## 2024-08-23 DIAGNOSIS — C109 Malignant neoplasm of oropharynx, unspecified: Secondary | ICD-10-CM | POA: Diagnosis not present

## 2024-08-23 DIAGNOSIS — R7401 Elevation of levels of liver transaminase levels: Secondary | ICD-10-CM | POA: Diagnosis not present

## 2024-08-23 DIAGNOSIS — J189 Pneumonia, unspecified organism: Secondary | ICD-10-CM | POA: Diagnosis not present

## 2024-08-23 DIAGNOSIS — E7800 Pure hypercholesterolemia, unspecified: Secondary | ICD-10-CM | POA: Diagnosis not present

## 2024-08-23 DIAGNOSIS — I10 Essential (primary) hypertension: Secondary | ICD-10-CM | POA: Diagnosis not present

## 2024-08-23 DIAGNOSIS — R7302 Impaired glucose tolerance (oral): Secondary | ICD-10-CM | POA: Diagnosis not present

## 2024-08-23 DIAGNOSIS — K5900 Constipation, unspecified: Secondary | ICD-10-CM | POA: Diagnosis not present

## 2024-08-24 ENCOUNTER — Other Ambulatory Visit: Payer: Self-pay | Admitting: Oncology

## 2024-08-24 ENCOUNTER — Other Ambulatory Visit: Payer: Self-pay | Admitting: Family Medicine

## 2024-08-24 ENCOUNTER — Other Ambulatory Visit (HOSPITAL_COMMUNITY): Payer: Self-pay

## 2024-08-24 ENCOUNTER — Ambulatory Visit
Admission: RE | Admit: 2024-08-24 | Discharge: 2024-08-24 | Disposition: A | Source: Ambulatory Visit | Attending: Family Medicine | Admitting: Family Medicine

## 2024-08-24 ENCOUNTER — Other Ambulatory Visit (HOSPITAL_BASED_OUTPATIENT_CLINIC_OR_DEPARTMENT_OTHER): Payer: Self-pay

## 2024-08-24 ENCOUNTER — Encounter: Payer: Self-pay | Admitting: Oncology

## 2024-08-24 DIAGNOSIS — E440 Moderate protein-calorie malnutrition: Secondary | ICD-10-CM | POA: Diagnosis not present

## 2024-08-24 DIAGNOSIS — J189 Pneumonia, unspecified organism: Secondary | ICD-10-CM | POA: Diagnosis not present

## 2024-08-24 DIAGNOSIS — J45909 Unspecified asthma, uncomplicated: Secondary | ICD-10-CM | POA: Diagnosis not present

## 2024-08-24 DIAGNOSIS — R1310 Dysphagia, unspecified: Secondary | ICD-10-CM | POA: Diagnosis not present

## 2024-08-24 DIAGNOSIS — E871 Hypo-osmolality and hyponatremia: Secondary | ICD-10-CM | POA: Diagnosis not present

## 2024-08-24 DIAGNOSIS — Z9181 History of falling: Secondary | ICD-10-CM | POA: Diagnosis not present

## 2024-08-24 DIAGNOSIS — I10 Essential (primary) hypertension: Secondary | ICD-10-CM | POA: Diagnosis not present

## 2024-08-24 DIAGNOSIS — Z8546 Personal history of malignant neoplasm of prostate: Secondary | ICD-10-CM | POA: Diagnosis not present

## 2024-08-24 DIAGNOSIS — C109 Malignant neoplasm of oropharynx, unspecified: Secondary | ICD-10-CM | POA: Diagnosis not present

## 2024-08-24 DIAGNOSIS — Z7982 Long term (current) use of aspirin: Secondary | ICD-10-CM | POA: Diagnosis not present

## 2024-08-24 DIAGNOSIS — J129 Viral pneumonia, unspecified: Secondary | ICD-10-CM

## 2024-08-24 DIAGNOSIS — I2510 Atherosclerotic heart disease of native coronary artery without angina pectoris: Secondary | ICD-10-CM | POA: Diagnosis not present

## 2024-08-24 DIAGNOSIS — E876 Hypokalemia: Secondary | ICD-10-CM | POA: Diagnosis not present

## 2024-08-24 MED ORDER — NYSTATIN 100000 UNIT/ML MT SUSP
5.0000 mL | Freq: Four times a day (QID) | OROMUCOSAL | 2 refills | Status: DC | PRN
  Filled 2024-08-24: qty 250, 12d supply, fill #0

## 2024-08-24 MED ORDER — NYSTATIN 100000 UNIT/ML MT SUSP
5.0000 mL | Freq: Four times a day (QID) | OROMUCOSAL | 2 refills | Status: AC | PRN
  Filled 2024-08-24: qty 140, 7d supply, fill #0

## 2024-08-25 ENCOUNTER — Ambulatory Visit: Payer: Self-pay | Admitting: Internal Medicine

## 2024-08-25 ENCOUNTER — Other Ambulatory Visit (HOSPITAL_COMMUNITY): Payer: Self-pay

## 2024-08-25 NOTE — Telephone Encounter (Signed)
 This RN attempted contact patient. Wife answered, [phone], and states, could you call back in about 10 minutes, I am doing my husband's feeding tube right now.  Will place in call back folder.

## 2024-08-25 NOTE — Telephone Encounter (Signed)
 FYI Only or Action Required?: FYI only for provider: appointment scheduled on 08/28/24.  Patient is followed in Pulmonology for recent PNA, last seen on N/A  Called Nurse Triage reporting Cough.  Symptoms began several weeks ago.  Interventions attempted: Prescription medications: azithromycin , Rescue inhaler, and Maintenance inhaler.  Symptoms are: stable.  Triage Disposition: See PCP When Office is Open (Within 3 Days)  Patient/caregiver understands and will follow disposition?: Yes   E2C2 Pulmonary Triage - Initial Assessment Questions "Chief Complaint (e.g., cough, sob, wheezing, fever, chills, sweat or additional symptoms) *Go to specific symptom protocol after initial questions. Cough  "How long have symptoms been present?" Since 08/11/24  Have you tested for COVID or Flu? Note: If not, ask patient if a home test can be taken. If so, instruct patient to call back for positive results. Yes  MEDICINES:   "Have you used any OTC meds to help with symptoms?" No If yes, ask "What medications?" NO  "Have you used your inhalers/maintenance medication?" Yes If yes, "What medications?" Advair  and albuterol  inhaler  If inhaler, ask "How many puffs and how often?" Note: Review instructions on medication in the chart. 1 puff BID for advair; albuterol  inhaler every 4 hours as needed  OXYGEN: "Do you wear supplemental oxygen?" No If yes, "How many liters are you supposed to use?" N/A  "Do you monitor your oxygen levels?" Yes If yes, What is your reading (oxygen level) today? 98%  What is your usual oxygen saturation reading?  (Note: Pulmonary O2 sats should be 90% or greater) High 90's  Reason for Disposition  Cough has been present for > 3 weeks  Answer Assessment - Initial Assessment Questions Spoke to patient's wife. She says he has a cough that has been ongoing since before he went to the hospital on 08/11/24. She says his cough seems worse since coming home, he  says cough is a 8/10 severity, coughing up thick yellow mucus. She says sometimes he's SOB walking around, denies CP, denies fever. She says she spoke to someone in the pulmonary office and an appointment was scheduled for 08/28/24. Advised ED over the weekend if symptoms worsen, CP, SOB, fever. She verbalized understanding and says she's waiting on a call from PCP tonight as well.    1. ONSET: When did the cough begin?      08/11/24  2. SEVERITY: How bad is the cough today?      8/10  3. SPUTUM: Describe the color of your sputum (e.g., none, dry cough; clear, white, yellow, green)     Thick and yellow  4. HEMOPTYSIS: Are you coughing up any blood? If Yes, ask: How much? (e.g., flecks, streaks, tablespoons, etc.)     No  5. DIFFICULTY BREATHING: Are you having difficulty breathing? If Yes, ask: How bad is it? (e.g., mild, moderate, severe)      Mild sometimes with moving around  6. FEVER: Do you have a fever? If Yes, ask: What is your temperature, how was it measured, and when did it start?     No  7. CARDIAC HISTORY: Do you have any history of heart disease? (e.g., heart attack, congestive heart failure)      Yes heart disease-blockage   8. LUNG HISTORY: Do you have any history of lung disease?  (e.g., pulmonary embolus, asthma, emphysema)     Asthma   10. OTHER SYMPTOMS: Do you have any other symptoms? (e.g., runny nose, wheezing, chest pain)       Mild SOB  when up moving sometimes  Protocols used: Cough - Acute Productive-A-AH

## 2024-08-25 NOTE — Telephone Encounter (Signed)
 Copied from CRM #[id number]. Topic: Clinical - Red Word Triage >> Aug 25, 2024  3:18 PM Lavanda D wrote: Red Word that prompted transfer to Nurse Triage: Mary with Dr. Magnus office is calling on behalf of the patient, she would like to get him scheduled urgently due to pneumonia and the PCP's recommendations to see pulmonology asap. She is unsure of what symptoms he may be experiencing but mentioned that the patient is low energy and on multiple antibiotics currently. Advised per lead to get over to NT due to pneumonia/older pt. Answer Assessment - Initial Assessment Questions 1. REASON FOR CALL: What is the main reason for your call? or How can I best help you?  PCP office, Cherri called, requesting the soonest appt with pulmonary.  Patient currently has pneumonia and under the care of oncology.  Patient is not currently in clinic.  Nurse Triage provided clinic office contact number and fax number.  Nurse Triage will attempt to contact patient.   FYI: patient will be a new patient for [facility] and need soonest appt.  Protocols used: Information Only Call - No Triage-A-AH

## 2024-08-25 NOTE — Telephone Encounter (Signed)
 Called CAL-pulm, no answer.

## 2024-08-26 DIAGNOSIS — Z8546 Personal history of malignant neoplasm of prostate: Secondary | ICD-10-CM | POA: Diagnosis not present

## 2024-08-26 DIAGNOSIS — J45909 Unspecified asthma, uncomplicated: Secondary | ICD-10-CM | POA: Diagnosis not present

## 2024-08-26 DIAGNOSIS — I2510 Atherosclerotic heart disease of native coronary artery without angina pectoris: Secondary | ICD-10-CM | POA: Diagnosis not present

## 2024-08-26 DIAGNOSIS — I10 Essential (primary) hypertension: Secondary | ICD-10-CM | POA: Diagnosis not present

## 2024-08-26 DIAGNOSIS — E871 Hypo-osmolality and hyponatremia: Secondary | ICD-10-CM | POA: Diagnosis not present

## 2024-08-26 DIAGNOSIS — E440 Moderate protein-calorie malnutrition: Secondary | ICD-10-CM | POA: Diagnosis not present

## 2024-08-26 DIAGNOSIS — E876 Hypokalemia: Secondary | ICD-10-CM | POA: Diagnosis not present

## 2024-08-26 DIAGNOSIS — C109 Malignant neoplasm of oropharynx, unspecified: Secondary | ICD-10-CM | POA: Diagnosis not present

## 2024-08-26 DIAGNOSIS — Z9181 History of falling: Secondary | ICD-10-CM | POA: Diagnosis not present

## 2024-08-26 DIAGNOSIS — Z7982 Long term (current) use of aspirin: Secondary | ICD-10-CM | POA: Diagnosis not present

## 2024-08-26 DIAGNOSIS — J189 Pneumonia, unspecified organism: Secondary | ICD-10-CM | POA: Diagnosis not present

## 2024-08-28 ENCOUNTER — Ambulatory Visit: Admitting: Pulmonary Disease

## 2024-08-28 ENCOUNTER — Encounter: Payer: Self-pay | Admitting: Oncology

## 2024-08-28 ENCOUNTER — Encounter: Payer: Self-pay | Admitting: Pulmonary Disease

## 2024-08-28 VITALS — BP 109/65 | HR 121 | Ht 69.0 in | Wt 169.2 lb

## 2024-08-28 DIAGNOSIS — K219 Gastro-esophageal reflux disease without esophagitis: Secondary | ICD-10-CM | POA: Diagnosis not present

## 2024-08-28 DIAGNOSIS — R197 Diarrhea, unspecified: Secondary | ICD-10-CM | POA: Diagnosis not present

## 2024-08-28 DIAGNOSIS — R1310 Dysphagia, unspecified: Secondary | ICD-10-CM | POA: Diagnosis not present

## 2024-08-28 DIAGNOSIS — C109 Malignant neoplasm of oropharynx, unspecified: Secondary | ICD-10-CM

## 2024-08-28 DIAGNOSIS — J189 Pneumonia, unspecified organism: Secondary | ICD-10-CM

## 2024-08-28 DIAGNOSIS — J4540 Moderate persistent asthma, uncomplicated: Secondary | ICD-10-CM

## 2024-08-28 DIAGNOSIS — Z931 Gastrostomy status: Secondary | ICD-10-CM | POA: Diagnosis not present

## 2024-08-28 MED ORDER — BUDESONIDE 0.25 MG/2ML IN SUSP: 0.2500 mg | Freq: Two times a day (BID) | RESPIRATORY_TRACT | 11 refills | Status: DC

## 2024-08-28 MED ORDER — ARFORMOTEROL TARTRATE 15 MCG/2ML IN NEBU: 15.0000 ug | INHALATION_SOLUTION | Freq: Two times a day (BID) | RESPIRATORY_TRACT | 11 refills | Status: DC

## 2024-08-28 NOTE — Patient Instructions (Addendum)
 We will order you a nebulizer machine to use as needed  Use albuterol  nebulizer solution as needed every 4-6 hours as needed to help with cough and mucous clearance  We will start you on long acting nebulizer medicines - budesonide  twice daily - brovana  twice daily - ok to mix nebulizer solutions together  Stop advair inhaler once you start the new nebulizer regimen  Use the flutter valve, 5-7 good puffs through the device 2-3 times daily to help with mucous clearance  Recommend sleeping with your head elevated at night using a wedge pillow, elevated the headboard or using a base adjustable mattress to sleep with the head at least at a 30 degree angle. This is to reduce night time reflux that could be causing pneumonias.  Agree with working with speech therapy  Monitor for fevers, chills, night sweats, abdominal pain for concern of C. Diff infection or returning pneumonia  Your recent chest x-ray looks improved. We will schedule you for a CT Chest scan at the end of January or early February to follow up on the pneumonia.  Follow up in 2 months

## 2024-08-28 NOTE — Progress Notes (Signed)
 New Patient Pulmonology Office Visit   Subjective:  Patient ID: Gilbert Thompson, male    DOB: Jun 20, 1951  MRN: [id number]  Referred by: Windy Coy, MD  CC:  Chief Complaint  Patient presents with   Consult    Pt states after chemo treatment ended up with pneumonia     Discussed the use of AI scribe software for clinical note transcription with the patient, who gave verbal consent to proceed.  History of Present Illness Gilbert Thompson is a 73 year old male, never smoker with history of asthma and head and neck cancer who is referred to pulmonary clinic for recurrent pneumonia.  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 He completed chemotherapy and is undergoing radiation therapy. On August 07, 2024, he was hypotensive and ill-appearing at Oncology follow up and went to the ER.  He was recently hospitalized for sepsis from pneumonia and treated with vancomycin  and cefepime , then narrowed to Augmentin . CT angio chest on August 14, 2024 showed right hilar adenopathy and progression of multifocal bilateral airspace disease, greatest in the right upper lobe, with trace bilateral effusions. Chest radiograph on August 24, 2024 showed patchy right lung airspace opacities that were improving compared to [facility] imaging.  He has chronic dysphagia and a G-tube for nutrition. He is currently on continuous feeds throughout day/night. He was previously on bolus feeding. He takes Miralax  daily and now has loose, foul-smelling stools without abdominal pain but with occasional bloating, raising concern for C. diff infection. He was tested for c. Diff in hospital with positive antigen and negative toxin. His diarrhea seemed to resolve in hospital.   He has a chronic cough that he links to his neck cancer. He now coughs up mostly white mucus, previously thick and yellow. He had chills yesterday but no current fevers. He uses an albuterol  inhaler and has asthma. He was  previously on Trelegy and is now on Advair twice a day due to dry mouth. He takes pantoprazole  twice a day for GERD.  He feels weak and has had difficulty regaining strength after hospitalization. He is in physical therapy. He can walk but develops shortness of breath with exertion, such as after showering. He also notes an uncomfortable dry tongue.        Review of Systems  Constitutional:  Negative for chills, fever, malaise/fatigue and weight loss.  HENT:  Negative for congestion, sinus pain and sore throat.   Eyes: Negative.   Respiratory:  Positive for cough, sputum production and shortness of breath. Negative for hemoptysis and wheezing.   Cardiovascular:  Negative for chest pain, palpitations, orthopnea, claudication and leg swelling.  Gastrointestinal:  Positive for diarrhea. Negative for abdominal pain, heartburn, nausea and vomiting.  Genitourinary: Negative.   Musculoskeletal:  Negative for joint pain and myalgias.  Skin:  Negative for rash.  Neurological:  Negative for weakness.  Endo/Heme/Allergies: Negative.   Psychiatric/Behavioral: Negative.      Allergies: Patient has no known allergies.  Current Outpatient Medications:    acetaminophen  (TYLENOL ) 325 MG tablet, Take 2 tablets (650 mg total) by mouth every 6 (six) hours as needed for mild pain (pain score 1-3) or fever (or Fever >/= 101). (Patient taking differently: Place 650 mg into feeding tube every 6 (six) hours as needed for mild pain (pain score 1-3) or fever (or Fever >/= 101).), Disp: , Rfl:    albuterol  (VENTOLIN  HFA) 108 (90 Base) MCG/ACT inhaler, Inhale 1-2 puffs into the lungs  3 (three) times daily for 5 days, THEN 1-2 puffs every 4 (four) hours as needed for wheezing or shortness of breath., Disp: 6.7 g, Rfl: 0   allopurinol  (ZYLOPRIM ) 300 MG tablet, Place 300 mg into feeding tube daily., Disp: , Rfl:    arformoterol  (BROVANA ) 15 MCG/2ML NEBU, Take 2 mLs (15 mcg total) by nebulization 2 (two) times daily.,  Disp: 120 mL, Rfl: 11   [Paused] aspirin  EC 81 MG tablet, Take 81 mg by mouth daily. Swallow whole., Disp: , Rfl:    azithromycin  (ZITHROMAX ) 250 MG tablet, , Disp: , Rfl:    budesonide  (PULMICORT ) 0.25 MG/2ML nebulizer solution, Take 2 mLs (0.25 mg total) by nebulization 2 (two) times daily., Disp: 120 mL, Rfl: 11   [Paused] ezetimibe  (ZETIA ) 10 MG tablet, TAKE 1 TABLET BY MOUTH DAILY, Disp: 90 tablet, Rfl: 3   finasteride  (PROSCAR ) 5 MG tablet, 5 mg See admin instructions. 5 mg per tube once a day, Disp: , Rfl:    fluticasone  (FLONASE ) 50 MCG/ACT nasal spray, Place 2 sprays into both nostrils daily for 7 days., Disp: 11.1 mL, Rfl: 0   lidocaine  (XYLOCAINE ) 2 % solution, Patient: Mix 1part 2% viscous lidocaine , 1part H20. Swish & swallow 10mL of diluted mixture, before meals and at bedtime, up to QID, Disp: 200 mL, Rfl: 3   magic mouthwash (nystatin , lidocaine , diphenhydrAMINE , alum & mag hydroxide) suspension, Swish and swallow 5 mLs by mouth 4 (four) times daily as needed for mouth pain., Disp: 140 mL, Rfl: 2   morphine  (MS CONTIN ) 30 MG 12 hr tablet, Take 1 tablet (30 mg total) by mouth every 12 (twelve) hours. (Patient taking differently: Take 30 mg by mouth See admin instructions. 30 mg, crushed, per tube, every 12 hours), Disp: 60 tablet, Rfl: 0   nitroGLYCERIN  (NITROSTAT ) 0.4 MG SL tablet, Place 0.4 mg under the tongue every 5 (five) minutes as needed for chest pain., Disp: , Rfl:    ondansetron  (ZOFRAN ) 8 MG tablet, Take 1 tablet (8 mg total) by mouth every 8 (eight) hours as needed for nausea or vomiting. Start on the third day after cisplatin . (Patient taking differently: Place 8 mg into feeding tube every 8 (eight) hours as needed for nausea or vomiting. Start on the third day after cisplatin .), Disp: 30 tablet, Rfl: 1   oxyCODONE  10 MG TABS, Take 1 tablet (10 mg total) by mouth every 6 (six) hours as needed. (Patient taking differently: Place 20 mg into feeding tube every 6 (six)  hours.), Disp: 120 tablet, Rfl: 0   [Paused] rosuvastatin  (CRESTOR ) 40 MG tablet, Take 1 tablet (40 mg total) by mouth daily., Disp: 90 tablet, Rfl: 3   ALPRAZolam  (XANAX ) 1 MG tablet, Place 1 mg into feeding tube daily as needed for anxiety or sleep. (Patient not taking: Reported on 08/28/2024), Disp: , Rfl:    antiseptic oral rinse (CORINZ) solution, 7 mLs by Mouth Rinse route as needed (for a dry mouth). (Patient not taking: Reported on 08/28/2024), Disp: , Rfl:    loperamide  HCl (IMODIUM ) 1 MG/7.5ML suspension, Place 15 mLs (2 mg total) into feeding tube as needed for diarrhea or loose stools. (Patient not taking: Reported on 08/28/2024), Disp: 120 mL, Rfl: 0   pantoprazole  (PROTONIX ) 40 MG tablet, Take 1 tablet (40 mg total) by mouth 2 (two) times daily. (Patient taking differently: Take 40 mg by mouth See admin instructions. 40 mg, per tube, two times a day), Disp: 180 tablet, Rfl: 3   polyethylene glycol (MIRALAX  /  GLYCOLAX ) 17 g packet, Place 17 g into feeding tube daily as needed for moderate constipation., Disp: , Rfl:    prochlorperazine  (COMPAZINE ) 10 MG tablet, Take 1 tablet (10 mg total) by mouth every 6 (six) hours as needed (Nausea or vomiting). 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, follwed by [facility]   Dyspnea    with exertion   GERD (gastroesophageal reflux disease)    Hyperlipidemia    Hypertension    Pneumonia    Past Surgical History:  Procedure Laterality Date   CATARACT EXTRACTION Bilateral    IR GASTROSTOMY TUBE MOD SED  06/23/2024   IR IMAGING GUIDED PORT INSERTION  06/23/2024   LEFT HEART CATH AND CORONARY ANGIOGRAPHY N/A 06/05/2022   Procedure: LEFT HEART CATH AND CORONARY ANGIOGRAPHY;  Surgeon: Claudene Victory ORN, MD;   Location: [facility];  Service: Cardiovascular;  Laterality: N/A;   LYMPHADENECTOMY Bilateral 04/29/2023   Procedure: BILATERAL PELVIC LYMPHADENECTOMY;  Surgeon: Renda Glance, MD;  Location: [facility];  Service: Urology;  Laterality: Bilateral;   PROSTATE BIOPSY     ROBOT ASSISTED LAPAROSCOPIC RADICAL PROSTATECTOMY N/A 04/29/2023   Procedure: XI ROBOTIC ASSISTED LAPAROSCOPIC RADICAL PROSTATECTOMY LEVEL 2;  Surgeon: Renda Glance, MD;  Location: [facility];  Service: Urology;  Laterality: N/A;  210 MINUTES NEEDED FOR CASE   UPPER GASTROINTESTINAL ENDOSCOPY     VENTRAL HERNIA REPAIR N/A 10/05/2023   Procedure: LAPAROSCOPIC VENTRAL HERNIA REPAAIR WITH MESH;  Surgeon: Rubin Calamity, MD;  Location: [facility] OR;  Service: General;  Laterality: N/A;   Family History  Problem Relation Age of Onset   Coronary artery disease Father    Prostate cancer Father 58 - 64   Leukemia Father 95   Coronary artery disease Brother    Prostate cancer Brother 64 - 59   Colon cancer Brother 46   Pancreatic cancer Cousin        maternal first cousin   Breast cancer Daughter 74       negative genetic testing   Esophageal cancer Neg Hx    Rectal cancer Neg Hx    Stomach cancer Neg Hx    Social History   Socioeconomic History   Marital status: Married    Spouse name: Not on file   Number of children: 1   Years of education: Not on file   Highest education level: Not on file  Occupational History   Not on file  Tobacco Use   Smoking status: Never   Smokeless tobacco: Never  Vaping Use   Vaping status: Never Used  Substance and Sexual Activity   Alcohol use: Yes    Alcohol/week: 14.0 standard drinks of alcohol    Types: 14 Standard drinks or equivalent per week    Comment: 2 drinks per day   Drug use: Never   Sexual activity: Not on file  Other Topics Concern   Not on file  Social History Narrative   Not on file   Social Drivers of Health   Financial Resource Strain: Not on file  Food  Insecurity: No Food Insecurity (08/14/2024)   Hunger Vital Sign    Worried About Running Out of Food in the Last  Year: Never true    Ran Out of Food in the Last Year: Never true  Transportation Needs: No Transportation Needs (08/14/2024)   PRAPARE - Administrator, Civil Service (Medical): No    Lack of Transportation (Non-Medical): No  Physical Activity: Not on file  Stress: Not on file  Social Connections: Moderately Isolated (08/14/2024)   Social Connection and Isolation Panel    Frequency of Communication with Friends and Family: Three times a week    Frequency of Social Gatherings with Friends and Family: Three times a week    Attends Religious Services: Never    Active Member of Clubs or Organizations: No    Attends Banker Meetings: Never    Marital Status: Married  Catering Manager Violence: Not At Risk (08/14/2024)   Humiliation, Afraid, Rape, and Kick questionnaire    Fear of Current or Ex-Partner: No    Emotionally Abused: No    Physically Abused: No    Sexually Abused: No       Objective:  BP 109/65   Pulse (!) 121   Ht 5' 9 (1.753 m) Comment: per pt  Wt 169 lb 3.2 oz (76.7 kg)   SpO2 96%   BMI 24.99 kg/m    Physical Exam Constitutional:      General: He is not in acute distress.    Appearance: Normal appearance.  Eyes:     General: No scleral icterus.    Conjunctiva/sclera: Conjunctivae normal.  Cardiovascular:     Rate and Rhythm: Normal rate and regular rhythm.  Pulmonary:     Breath sounds: No wheezing, rhonchi or rales.  Musculoskeletal:     Right lower leg: No edema.     Left lower leg: No edema.  Skin:    General: Skin is warm and dry.  Neurological:     General: No focal deficit present.    CT Chest 08/14/24 1. No evidence of pulmonary embolus. 2. Progressive multifocal bilateral airspace disease, greatest in the right upper lobe, consistent with bilateral bronchopneumonia. 3. Trace bilateral parapneumonic  effusions. 4. Stable right hilar lymphadenopathy.   Diagnostic Review:  Last CBC Lab Results  Component Value Date   WBC 3.2 (L) 08/16/2024   HGB 7.4 (L) 08/16/2024   HCT 22.6 (L) 08/16/2024   MCV 90.4 08/16/2024   MCH 29.6 08/16/2024   RDW 17.4 (H) 08/16/2024   PLT 157 08/16/2024   Last metabolic panel Lab Results  Component Value Date   GLUCOSE 135 (H) 08/16/2024   NA 135 08/16/2024   K 4.1 08/16/2024   CL 101 08/16/2024   CO2 24 08/16/2024   BUN 13 08/16/2024   CREATININE 0.72 08/16/2024   GFRNONAA >60 08/16/2024   CALCIUM  8.6 (L) 08/16/2024   PHOS 3.4 08/16/2024   PROT 6.2 (L) 08/16/2024   ALBUMIN 2.5 (L) 08/16/2024   LABGLOB 2.3 05/09/2024   BILITOT <0.2 08/16/2024   ALKPHOS 95 08/16/2024   AST 74 (H) 08/16/2024   ALT 75 (H) 08/16/2024   ANIONGAP 10 08/16/2024       Assessment & Plan:   Assessment & Plan Oropharyngeal cancer (HCC)  Orders:   Ambulatory Referral for DME  Recurrent pneumonia  Orders:   CT Chest Wo Contrast; Future   Ambulatory Referral for DME  Moderate persistent asthma without complication  Orders:   budesonide  (PULMICORT ) 0.25 MG/2ML nebulizer solution; Take 2 mLs (0.25 mg total) by nebulization 2 (two) times daily.   arformoterol  (BROVANA ) 15 MCG/2ML NEBU; Take  2 mLs (15 mcg total) by nebulization 2 (two) times daily.   Ambulatory Referral for DME   Assessment and Plan Assessment & Plan Recurrent Pneumonia - Continue azithromycin  for two more days. - Monitor for symptoms of fever, chills, or changes in sputum. - Ordered CT chest scan for late January or early February. - Ordered nebulizer machine and albuterol  nebulizer solution. - Consider hypertonic saline solution if albuterol  nebulizer is ineffective.  Moderate persistent asthma Asthma managed with Advair and albuterol  inhaler. Transitioning to nebulizer regimen due to dry mouth from Advair. - Switched to nebulizer regimen with budesonide  and Brovana . - Continue  albuterol  inhaler as needed. - Ordered nebulizer machine and supplies. - Stop advair inhaler once starting on nebulizer regimen  Malignant oropharyngeal squamous cell carcinoma, HPV-positive, post-chemotherapy, on radiation - Follow up with oncology and radiation oncology  Chronic dysphagia with gastrostomy tube dependence Chronic dysphagia with gastrostomy tube in place. Risk of aspiration due to swallowing difficulties. - Continue speech therapy at home. - Consider wedge pillow or bed elevation to prevent nocturnal reflux and aspiration. - Consider avoiding nocturnal feeds if able  Recurrent diarrhea, likely multifactorial (C. diff antigen positive, Miralax  use) Recurrent diarrhea with positive C. diff antigen but negative toxin. Miralax  use may contribute to loose stools. - Adjust Miralax  dosage based on bowel movement frequency. - Monitor for symptoms of C. diff infection such as fever, chills, and abdominal pain.  Gastroesophageal reflux disease (GERD) GERD managed with pantoprazole . Risk of nocturnal reflux due to continuous tube feeds. - Use wedge pillow or bed elevation to prevent nocturnal reflux. - Continue pantoprazole  twice daily.        Return in about 2 months (around 10/29/2024) for f/u visit Dr. Kara.   Dorn KATHEE Kara, MD

## 2024-08-28 NOTE — Assessment & Plan Note (Addendum)
  Orders:   Ambulatory Referral for DME

## 2024-08-29 DIAGNOSIS — J45909 Unspecified asthma, uncomplicated: Secondary | ICD-10-CM | POA: Diagnosis not present

## 2024-08-29 DIAGNOSIS — J189 Pneumonia, unspecified organism: Secondary | ICD-10-CM | POA: Diagnosis not present

## 2024-08-29 DIAGNOSIS — Z8546 Personal history of malignant neoplasm of prostate: Secondary | ICD-10-CM | POA: Diagnosis not present

## 2024-08-29 DIAGNOSIS — Z9181 History of falling: Secondary | ICD-10-CM | POA: Diagnosis not present

## 2024-08-29 DIAGNOSIS — E871 Hypo-osmolality and hyponatremia: Secondary | ICD-10-CM | POA: Diagnosis not present

## 2024-08-29 DIAGNOSIS — E440 Moderate protein-calorie malnutrition: Secondary | ICD-10-CM | POA: Diagnosis not present

## 2024-08-29 DIAGNOSIS — C109 Malignant neoplasm of oropharynx, unspecified: Secondary | ICD-10-CM | POA: Diagnosis not present

## 2024-08-29 DIAGNOSIS — I2510 Atherosclerotic heart disease of native coronary artery without angina pectoris: Secondary | ICD-10-CM | POA: Diagnosis not present

## 2024-08-29 DIAGNOSIS — I10 Essential (primary) hypertension: Secondary | ICD-10-CM | POA: Diagnosis not present

## 2024-08-29 DIAGNOSIS — Z7982 Long term (current) use of aspirin: Secondary | ICD-10-CM | POA: Diagnosis not present

## 2024-08-29 DIAGNOSIS — E876 Hypokalemia: Secondary | ICD-10-CM | POA: Diagnosis not present

## 2024-08-30 ENCOUNTER — Inpatient Hospital Stay: Admitting: Oncology

## 2024-08-30 ENCOUNTER — Telehealth: Payer: Self-pay | Admitting: Pulmonary Disease

## 2024-08-30 ENCOUNTER — Inpatient Hospital Stay: Admitting: Dietician

## 2024-08-30 ENCOUNTER — Other Ambulatory Visit: Payer: Self-pay

## 2024-08-30 ENCOUNTER — Inpatient Hospital Stay: Attending: Oncology

## 2024-08-30 VITALS — BP 140/73 | HR 115 | Temp 97.3°F | Resp 17 | Ht 69.0 in | Wt 170.0 lb

## 2024-08-30 DIAGNOSIS — C109 Malignant neoplasm of oropharynx, unspecified: Secondary | ICD-10-CM | POA: Diagnosis not present

## 2024-08-30 DIAGNOSIS — Z79899 Other long term (current) drug therapy: Secondary | ICD-10-CM | POA: Insufficient documentation

## 2024-08-30 DIAGNOSIS — E46 Unspecified protein-calorie malnutrition: Secondary | ICD-10-CM | POA: Diagnosis not present

## 2024-08-30 DIAGNOSIS — G893 Neoplasm related pain (acute) (chronic): Secondary | ICD-10-CM | POA: Insufficient documentation

## 2024-08-30 DIAGNOSIS — C779 Secondary and unspecified malignant neoplasm of lymph node, unspecified: Secondary | ICD-10-CM | POA: Insufficient documentation

## 2024-08-30 DIAGNOSIS — R682 Dry mouth, unspecified: Secondary | ICD-10-CM | POA: Diagnosis not present

## 2024-08-30 DIAGNOSIS — R1310 Dysphagia, unspecified: Secondary | ICD-10-CM | POA: Insufficient documentation

## 2024-08-30 DIAGNOSIS — C61 Malignant neoplasm of prostate: Secondary | ICD-10-CM | POA: Diagnosis not present

## 2024-08-30 DIAGNOSIS — Z923 Personal history of irradiation: Secondary | ICD-10-CM | POA: Insufficient documentation

## 2024-08-30 LAB — CBC WITH DIFFERENTIAL (CANCER CENTER ONLY)
Abs Immature Granulocytes: 0.22 K/uL — ABNORMAL HIGH (ref 0.00–0.07)
Basophils Absolute: 0.1 K/uL (ref 0.0–0.1)
Basophils Relative: 1 %
Eosinophils Absolute: 0.2 K/uL (ref 0.0–0.5)
Eosinophils Relative: 1 %
HCT: 28.2 % — ABNORMAL LOW (ref 39.0–52.0)
Hemoglobin: 9.3 g/dL — ABNORMAL LOW (ref 13.0–17.0)
Immature Granulocytes: 2 %
Lymphocytes Relative: 12 %
Lymphs Abs: 1.8 K/uL (ref 0.7–4.0)
MCH: 29.3 pg (ref 26.0–34.0)
MCHC: 33 g/dL (ref 30.0–36.0)
MCV: 89 fL (ref 80.0–100.0)
Monocytes Absolute: 1.5 K/uL — ABNORMAL HIGH (ref 0.1–1.0)
Monocytes Relative: 11 %
Neutro Abs: 10.7 K/uL — ABNORMAL HIGH (ref 1.7–7.7)
Neutrophils Relative %: 73 %
Platelet Count: 432 K/uL — ABNORMAL HIGH (ref 150–400)
RBC: 3.17 MIL/uL — ABNORMAL LOW (ref 4.22–5.81)
RDW: 18.1 % — ABNORMAL HIGH (ref 11.5–15.5)
WBC Count: 14.5 K/uL — ABNORMAL HIGH (ref 4.0–10.5)
nRBC: 0 % (ref 0.0–0.2)

## 2024-08-30 LAB — BASIC METABOLIC PANEL - CANCER CENTER ONLY
Anion gap: 11 (ref 5–15)
BUN: 21 mg/dL (ref 8–23)
CO2: 26 mmol/L (ref 22–32)
Calcium: 9.5 mg/dL (ref 8.9–10.3)
Chloride: 97 mmol/L — ABNORMAL LOW (ref 98–111)
Creatinine: 0.79 mg/dL (ref 0.61–1.24)
GFR, Estimated: >60 mL/min (ref >60)
Glucose, Bld: 124 mg/dL — ABNORMAL HIGH (ref 70–99)
Potassium: 4.1 mmol/L (ref 3.5–5.1)
Sodium: 135 mmol/L (ref 135–145)

## 2024-08-30 LAB — MAGNESIUM: Magnesium: 1.7 mg/dL (ref 1.7–2.4)

## 2024-08-30 MED ORDER — MORPHINE SULFATE ER 30 MG PO TBCR: 30.0000 mg | EXTENDED_RELEASE_TABLET | Freq: Two times a day (BID) | ORAL | Status: DC

## 2024-08-30 MED ORDER — HYDROMORPHONE HCL 2 MG PO TABS: 2.0000 mg | ORAL_TABLET | Freq: Four times a day (QID) | ORAL | 0 refills | Status: DC | PRN

## 2024-08-30 MED ORDER — MORPHINE SULFATE ER 30 MG PO TBCR: 30.0000 mg | EXTENDED_RELEASE_TABLET | Freq: Two times a day (BID) | ORAL | 0 refills | Status: DC

## 2024-08-30 NOTE — Progress Notes (Signed)
 "  [facility]  ONCOLOGY CLINIC PROGRESS NOTE   Patient Care Team: Windy Coy, MD as PCP - General (Family Medicine) Pietro, Redell RAMAN, MD as PCP - Cardiology (Cardiology) Vertell Pont, RN as Oncology Nurse Navigator Renda Glance, MD as Consulting Physician (Urology) Patrcia Cough, MD as Consulting Physician (Radiation Oncology) Starla Wendelyn BIRCH, RN as Registered Nurse Izell Domino, MD as Consulting Physician (Radiation Oncology) Autumn Millman, MD as Consulting Physician (Oncology) Lauralee Chew, MD as Referring Physician (Otolaryngology) Malmfelt, Delon CROME, RN as Oncology Nurse Navigator  PATIENT NAME: Gilbert Thompson   MR#: [id number] DOB: 07/15/1951  Date of visit: 08/30/2024   ASSESSMENT & PLAN:   WATSON ROBARGE is a 73 y.o. 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  Concern for extranodal extension based on imaging.  Started concurrent chemoradiation with weekly cisplatin  from 06/27/2024.  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   CT scan from July 2025 showed lymph nodes with extranodal extension, indicating a higher risk of recurrence without appropriate treatment. The cancer is aggressive due to lymph node involvement but has not spread beyond the head and neck area.   cT2,cN1,cM0,p16+, Stage 1 disease.  Concern for extranodal extension based on imaging.  Previously I discussed staging, prognosis, plan of care, treatment options.  Reviewed NCCN guidelines.  Started concurrent chemoradiation with weekly cisplatin  from 06/27/2024.  Creatinine went up to 1.44 on 07/12/2024.  Dose reduced cisplatin  by 50% with  cycle 3 as a result.  With normalization of creatinine, cisplatin  was resumed at standard dose from cycle 4 onwards.  Cycle 5 of chemotherapy was given on 07/26/2024.  Further chemotherapy held because of neutropenia.   He was hospitalized from 08/08/2023 until 08/12/2024 for sepsis secondary to bilateral aspiration pneumonia.   Completed radiation treatments on 08/15/2024.  He is in the post-treatment phase following chemotherapy and radiation, with gradual recovery anticipated over the next month. Recent complications included pneumonia and Clostridioides difficile infection, both improving. Laboratory evaluation revealed compensatory leukocytosis and thrombocytosis, with persistent anemia. Liver enzymes, previously elevated, are trending toward normalization. - Planned PET scan after radiation oncology visit to assess treatment response. - Scheduled hematology/oncology follow-up for end of January.  Cancer related pain Hydrocodone  was discontinued due to gastrointestinal side effects. He is currently managed with extended-release morphine  for baseline pain and oxycodone  for breakthrough pain, but prefers hydromorphone  (Dilaudid ) for breakthrough episodes. He is able to swallow pain medications. Pain control remains a priority, with regimen adjustments to optimize efficacy and minimize adverse effects. Dilaudid  was discussed as a preferred alternative for breakthrough pain, with education provided regarding safe dosing intervals and avoidance of dose doubling. - Sent prescription for extended-release morphine  for baseline pain control. - Sent prescription for Dilaudid  (hydromorphone ) 2 mg tablets for breakthrough pain, up to three times daily as needed, with instructions to avoid double dosing and to adjust frequency if needed. - Discontinued oxycodone  in favor of Dilaudid  for breakthrough pain. - Provided guidance on safe dosing intervals for Dilaudid .  Protein-calorie malnutrition He has  ongoing protein-calorie malnutrition due to poor oral intake from dysphagia and oral mucosal dryness. Tube feedings remain necessary, as attempts to reduce tube feeds have been limited by inadequate oral intake and nocturnal  nausea. - Continued tube feedings for nutritional support. - Emphasized importance of hydration and nutrition during recovery.  Dysphagia and oral mucosal dryness He has persistent dysphagia and significant oral mucosal dryness, with dry tongue as the main complaint. He is unable to tolerate lozenges due to fear of choking but is using Biotene rinse for symptomatic relief. These symptoms are expected post-treatment and are gradually improving. - Continued Biotene rinse for oral dryness. - Encouraged ongoing physical therapy to support recovery.  I reviewed lab results and outside records for this visit and discussed relevant results with the patient. Diagnosis, plan of care and treatment options were also discussed in detail with the patient. Opportunity provided to ask questions and answers provided to his apparent satisfaction. Provided instructions to call our clinic with any problems, questions or concerns prior to return visit. I recommended to continue follow-up with PCP and sub-specialists. He verbalized understanding and agreed with the plan.   NCCN guidelines have been consulted in the planning of this patients care.  I spent a total of 42 minutes during this encounter with the patient including review of chart and various tests results, discussions about plan of care and coordination of care plan.   [name], MD  08/30/2024 4:22 PM  [facility] - A DEPT OF [facility] 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 cT2,cN1,cM0,p16+, Stage 1 disease.  Concern  for extranodal extension based on imaging.    Current Treatment:  Started concurrent chemoradiation with weekly cisplatin  from 06/27/2024. Cycle 5 of cisplatin  given on 07/26/24. Further chemo held because of neutropenia and hospitalization with pneumonia.   INTERVAL HISTORY:    Discussed the use of AI scribe software for clinical note transcription with the patient, who gave verbal consent to proceed.  History of Present Illness TYLYN DERWIN is a 73 year old male with oropharyngeal cancer post-chemoradiation who presents for oncology follow-up and supportive care.  He is in the recovery phase following chemoradiation for oropharyngeal cancer, with the last chemotherapy administered on July 26, 2024. He did not receive the scheduled chemotherapy on August 02, 2024, due to complications. He continues to experience significant oral mucosal dryness, particularly of the tongue, and persistent dysphagia, which limits oral intake. He avoids lozenges or mints due to concern for choking and uses a biotin rinse for symptomatic relief. He is participating in physical therapy to support recovery.  He remains dependent on tube feedings for nutrition, as attempts to reduce tube feeds during sleep have been unsuccessful due to inadequate oral intake and persistent nocturnal nausea, typically around 7 PM. He is scheduled to discuss further nutritional management with the nutritionist today. He continues to experience fatigue and has not returned to baseline functional status.  He was recently hospitalized twice for pneumonia and Clostridioides difficile infection. Diarrhea has improved and he is no longer on antibiotics for C. difficile. Pulmonary symptoms are improving, though residual findings remain, and he was evaluated by pulmonology earlier this week. Laboratory studies show a white blood cell count of 14,500, elevated platelets, and a hemoglobin of 9.3 g/dL. Recent laboratory results showed normal  creatinine, magnesium , and potassium levels. Liver enzymes were previously elevated and were not checked at this visit; they were reported to be improving before discharge in late November.  Pain management remains challenging. He  previously used hydrocodone  but discontinued it due to gastrointestinal side effects. His current regimen includes extended-release morphine  twice daily and oxycodone  for breakthrough pain, though he prefers hydromorphone  for breakthrough episodes. He is now able to swallow pain medications, which were previously administered via feeding tube during hospitalization.   I have reviewed the past medical history, past surgical history, social history and family history with the patient and they are unchanged from previous note.  HISTORY OF PRESENT ILLNESS:   ONCOLOGY HISTORY:   He presented to his PCP with complains of chronic pharyngitis and a left submandibular mass that has persisted over the past year.   To further investigate his symptoms, he underwent a CT neck and CT chest on 04/06/24 showing a 3.0 x 2.4 x 2.5 cm soft tissue mass centered in the left oropharynx at the glossotonsillar sulcus with possible involvement of the adjacent tongue base. 7 mm low-attenuation left level IB lymph node likely reflecting a site of nodal metastatic disease. 1.7 x 1.7 cm irregular soft tissue focus at the left level II station, inseparable from the adjacent sternocleidomastoid muscle. This is suspicious for nodal metastatic disease (with findings concerning for extracapsular extension) or a tumor deposit. 9 mm short-axis low attenuation left level III lymph node likely reflecting a site of nodal metastatic disease. Scans also noted scattered tiny pulmonary nodules are unchanged and likely benign but with only 5 months of documented stability but with recommend attention on follow-up given his history of prostate cancer.    Subsequently, the patient was referred to Dr. Graig on 04/24/24 who  performed a FNA biopsy of left neck. However, biopsy was inconclusive. As a result, he then underwent a laryngoscopy with biopsy on 05/15/24 under the care of Dr. Lauralee. 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 Tumor is moderately differentiated with foci of poor differentiation and invasion into the striated muscle. Immunohistochemical studies are p16, p40, and HRHPV are positive.    He had a cervical spine MRI performed on 05/09/24 revealing no evidence of metastatic disease to the cervical spine itself but did indicated degenerative spondylosis at C4-5 and C5-6. 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 PET scan performed on 04/26/24 showing FDG avid mass along the left tongue base/oropharynx with left cervical chain nodal metastatic disease with no suspicious pulmonary or mediastinal FDG uptake.   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    Prior cancers, if any: history of prostate cancer.   Given extracapsular extension with lymph node positive disease, plan made to proceed with concurrent chemoradiation.   Plan made for weekly cisplatin  with radiation, if renal function allows.  Otherwise we will use either docetaxel or carboplatin/paclitaxel regimen.  Started concurrent chemoradiation with weekly cisplatin  from 06/27/2024.  Creatinine went up to 1.44 on 07/12/2024.  Dose reduced cisplatin  by 50% with cycle 3 as a result.  With normalization of creatinine, cisplatin  was resumed at standard dose from cycle 4 onwards.  Cycle 5 of chemotherapy was given on 07/26/2024.  Further chemotherapy held because of neutropenia.   He was hospitalized from 08/08/2023 until 08/12/2024 for sepsis secondary to bilateral aspiration pneumonia.   Completed radiation treatments on 08/15/2024.  Oncology History  Malignant neoplasm  of prostate (HCC)  01/19/2023 Cancer Staging   Staging form: Prostate, AJCC 8th Edition - Clinical stage from 01/19/2023: Stage IIC (  cT2a, cN0, cM0, PSA: 6, Grade Group: 4) - Signed by Sherwood Rise, PA-C on 03/30/2023 Histopathologic type: Adenocarcinoma, NOS Stage prefix: Initial diagnosis Prostate specific antigen (PSA) range: Less than 10 Gleason primary pattern: 4 Gleason secondary pattern: 4 Gleason score: 8 Histologic grading system: 5 grade system Number of biopsy cores examined: 14 Number of biopsy cores positive: 5 Location of positive needle core biopsies: One side   02/24/2023 Initial Diagnosis   Malignant neoplasm of prostate (HCC)   Oropharyngeal cancer (HCC)  06/07/2024 Initial Diagnosis   Malignant neoplasm of base of tongue (HCC)   06/07/2024 Cancer Staging   Staging form: Pharynx - HPV-Mediated Oropharynx, AJCC 8th Edition - Clinical stage from 06/07/2024: Stage I (cT2, cN1, cM0, p16+) - Signed by Izell Domino, MD on 06/07/2024 Stage prefix: Initial diagnosis   06/28/2024 -  Chemotherapy   Patient is on Treatment Plan : HEAD/NECK Cisplatin  (40) q7d         REVIEW OF SYSTEMS:   Review of Systems  Respiratory:  Positive for shortness of breath.   Gastrointestinal:  Positive for constipation and diarrhea.  Neurological:  Positive for dizziness.    All other pertinent systems were reviewed with the patient and are negative.  ALLERGIES: He has no known allergies.  MEDICATIONS:  Current Outpatient Medications  Medication Sig Dispense Refill   acetaminophen  (TYLENOL ) 325 MG tablet Take 2 tablets (650 mg total) by mouth every 6 (six) hours as needed for mild pain (pain score 1-3) or fever (or Fever >/= 101). (Patient taking differently: Place 650 mg into feeding tube every 6 (six) hours as needed for mild pain (pain score 1-3) or fever (or Fever >/= 101).)     albuterol  (VENTOLIN  HFA) 108 (90 Base) MCG/ACT inhaler Inhale 1-2 puffs into the lungs 3 (three) times  daily for 5 days, THEN 1-2 puffs every 4 (four) hours as needed for wheezing or shortness of breath. 6.7 g 0   allopurinol  (ZYLOPRIM ) 300 MG tablet Place 300 mg into feeding tube daily.     arformoterol  (BROVANA ) 15 MCG/2ML NEBU Take 2 mLs (15 mcg total) by nebulization 2 (two) times daily. 120 mL 11   [Paused] aspirin  EC 81 MG tablet Take 81 mg by mouth daily. Swallow whole.     azithromycin  (ZITHROMAX ) 250 MG tablet      budesonide  (PULMICORT ) 0.25 MG/2ML nebulizer solution Take 2 mLs (0.25 mg total) by nebulization 2 (two) times daily. 120 mL 11   [Paused] ezetimibe  (ZETIA ) 10 MG tablet TAKE 1 TABLET BY MOUTH DAILY 90 tablet 3   finasteride  (PROSCAR ) 5 MG tablet 5 mg See admin instructions. 5 mg per tube once a day     fluticasone  (FLONASE ) 50 MCG/ACT nasal spray Place 2 sprays into both nostrils daily for 7 days. 11.1 mL 0   lidocaine  (XYLOCAINE ) 2 % solution Patient: Mix 1part 2% viscous lidocaine , 1part H20. Swish & swallow 10mL of diluted mixture, 30min before meals and at bedtime, up to QID 200 mL 3   magic mouthwash (nystatin , lidocaine , diphenhydrAMINE , alum & mag hydroxide) suspension Swish and swallow 5 mLs by mouth 4 (four) times daily as needed for mouth pain. 140 mL 2   morphine  (MS CONTIN ) 30 MG 12 hr tablet Take 1 tablet (30 mg total) by mouth every 12 (twelve) hours. (Patient taking differently: Take 30 mg by mouth See admin instructions. 30 mg, crushed, per tube, every 12 hours) 60 tablet 0   nitroGLYCERIN  (NITROSTAT ) 0.4 MG SL tablet Place 0.4 mg  under the tongue every 5 (five) minutes as needed for chest pain.     ondansetron  (ZOFRAN ) 8 MG tablet Take 1 tablet (8 mg total) by mouth every 8 (eight) hours as needed for nausea or vomiting. Start on the third day after cisplatin . (Patient taking differently: Place 8 mg into feeding tube every 8 (eight) hours as needed for nausea or vomiting. Start on the third day after cisplatin .) 30 tablet 1   oxyCODONE  10 MG TABS Take 1 tablet (10  mg total) by mouth every 6 (six) hours as needed. (Patient taking differently: Place 20 mg into feeding tube every 6 (six) hours.) 120 tablet 0   pantoprazole  (PROTONIX ) 40 MG tablet Take 1 tablet (40 mg total) by mouth 2 (two) times daily. (Patient taking differently: Take 40 mg by mouth See admin instructions. 40 mg, per tube, two times a day) 180 tablet 3   polyethylene glycol (MIRALAX  / GLYCOLAX ) 17 g packet Place 17 g into feeding tube daily as needed for moderate constipation.     [Paused] rosuvastatin  (CRESTOR ) 40 MG tablet Take 1 tablet (40 mg total) by mouth daily. 90 tablet 3   ALPRAZolam  (XANAX ) 1 MG tablet Place 1 mg into feeding tube daily as needed for anxiety or sleep. (Patient not taking: Reported on 08/30/2024)     antiseptic oral rinse (CORINZ) solution 7 mLs by Mouth Rinse route as needed (for a dry mouth). (Patient not taking: Reported on 08/30/2024)     celecoxib (CELEBREX) 200 MG capsule Take 200 mg by mouth daily. (Patient not taking: Reported on 08/30/2024)     loperamide  HCl (IMODIUM ) 1 MG/7.5ML suspension Place 15 mLs (2 mg total) into feeding tube as needed for diarrhea or loose stools. (Patient not taking: Reported on 08/30/2024) 120 mL 0   losartan  (COZAAR ) 50 MG tablet Take 50 mg by mouth daily. (Patient not taking: Reported on 08/30/2024)     prochlorperazine  (COMPAZINE ) 10 MG tablet Take 1 tablet (10 mg total) by mouth every 6 (six) hours as needed (Nausea or vomiting). (Patient not taking: Reported on 08/30/2024) 30 tablet 1   No current facility-administered medications for this visit.     VITALS:   Blood pressure (!) 140/73, pulse (!) 115, temperature (!) 97.3 F (36.3 C), temperature source Temporal, resp. rate 17, height 5' 9 (1.753 m), weight 170 lb (77.1 kg), SpO2 100%.  Wt Readings from Last 3 Encounters:  08/30/24 170 lb (77.1 kg)  08/28/24 169 lb 3.2 oz (76.7 kg)  08/16/24 178 lb 12.7 oz (81.1 kg)    Body mass index is 25.1 kg/m.   Onc Performance  Status - 08/30/24 1613       ECOG Perf Status   ECOG Perf Status Restricted in physically strenuous activity but ambulatory and able to carry out work of a light or sedentary nature, e.g., light house work, office work      KPS SCALE   KPS % SCORE Cares for self, unable to carry on normal activity or to do active work           PHYSICAL EXAM:   Physical Exam Constitutional:      General: He is not in acute distress.    Appearance: Normal appearance.  HENT:     Head: Normocephalic and atraumatic.     Mouth/Throat:     Comments: Signs of mild mucositis with erythema in the back of the throat.  No definite thrush. Eyes:     Conjunctiva/sclera: Conjunctivae normal.  Neck:  Comments:  2 cm, relatively firm mass at the angle of left jaw Cardiovascular:     Rate and Rhythm: Normal rate and regular rhythm.  Pulmonary:     Effort: Pulmonary effort is normal. No respiratory distress.  Chest:     Comments: Right-sided Port-A-Cath in place without any signs of infection Abdominal:     General: There is no distension.     Comments: Feeding tube in place  Neurological:     General: No focal deficit present.     Mental Status: He is alert and oriented to person, place, and time.  Psychiatric:        Mood and Affect: Mood normal.        Behavior: Behavior normal.      LABORATORY DATA:   I have reviewed the data as listed.  Results for orders placed or performed in visit on 08/30/24  Magnesium   Result Value Ref Range   Magnesium  1.7 1.7 - 2.4 mg/dL  Basic Metabolic Panel - Cancer Center Only  Result Value Ref Range   Sodium 135 135 - 145 mmol/L   Potassium 4.1 3.5 - 5.1 mmol/L   Chloride 97 (L) 98 - 111 mmol/L   CO2 26 22 - 32 mmol/L   Glucose, Bld 124 (H) 70 - 99 mg/dL   BUN 21 8 - 23 mg/dL   Creatinine 9.20 9.38 - 1.24 mg/dL   Calcium  9.5 8.9 - 10.3 mg/dL   GFR, Estimated >39 >39 mL/min   Anion gap 11 5 - 15  CBC with Differential (Cancer Center Only)  Result  Value Ref Range   WBC Count 14.5 (H) 4.0 - 10.5 K/uL   RBC 3.17 (L) 4.22 - 5.81 MIL/uL   Hemoglobin 9.3 (L) 13.0 - 17.0 g/dL   HCT 71.7 (L) 60.9 - 47.9 %   MCV 89.0 80.0 - 100.0 fL   MCH 29.3 26.0 - 34.0 pg   MCHC 33.0 30.0 - 36.0 g/dL   RDW 81.8 (H) 88.4 - 84.4 %   Platelet Count 432 (H) 150 - 400 K/uL   nRBC 0.0 0.0 - 0.2 %   Neutrophils Relative % 73 %   Neutro Abs 10.7 (H) 1.7 - 7.7 K/uL   Lymphocytes Relative 12 %   Lymphs Abs 1.8 0.7 - 4.0 K/uL   Monocytes Relative 11 %   Monocytes Absolute 1.5 (H) 0.1 - 1.0 K/uL   Eosinophils Relative 1 %   Eosinophils Absolute 0.2 0.0 - 0.5 K/uL   Basophils Relative 1 %   Basophils Absolute 0.1 0.0 - 0.1 K/uL   Immature Granulocytes 2 %   Abs Immature Granulocytes 0.22 (H) 0.00 - 0.07 K/uL      RADIOGRAPHIC STUDIES:  No recent pertinent imaging available to review.   CODE STATUS:  Code Status History     Date Active Date Inactive Code Status Order ID Comments User Context   06/23/2024 1257 06/24/2024 0510 Full Code [id number]  Hughes Simmonds, MD HOV   06/23/2024 1257 06/23/2024 1257 Full Code [id number]  Hughes Simmonds, MD HOV   04/29/2023 1830 04/30/2023 1811 Full Code [id number]  Renda Glance, MD Inpatient   06/05/2022 0846 06/05/2022 1634 Full Code [id number]  Claudene Victory ORN, MD Inpatient    Questions for Most Recent Historical Code Status (Order [id number])     Question Answer   By: Consent: discussion documented in EHR            No orders of the defined types were placed in  this encounter.    Future Appointments  Date Time Provider Department Center  09/12/2024 10:00 AM Wyatt Leeroy CHRISTELLA DEVONNA [facility] None  10/18/2024 10:00 AM [facility]  10/30/2024  9:45 AM Kara Dorn NOVAK, MD [facility] [address]  12/22/2024  3:30 PM [facility] 1 [facility]     This document was completed utilizing speech recognition software. Grammatical errors, random word insertions, pronoun errors, and incomplete sentences  are an occasional consequence of this system due to software limitations, ambient noise, and hardware issues. Any formal questions or concerns about the content, text or information contained within the body of this dictation should be directly addressed to the provider for clarification.   "

## 2024-08-30 NOTE — Progress Notes (Signed)
 Nutrition Follow-up:  Pt with primary SCC of oropharynx, p16+.  He is receiving concurrent chemoradiation with weekly cisplatin  (start RT 10/7). Patient is under the care of Dr. Izell and Dr. Autumn.    S/p PEG - 10/3  11/17-11/22  admission with sepsis of undetermined organism 11/24 - 11/26 admission with HCAP  Met with patient and wife in clinic after MD. Patient tolerating some thin liquids (water , prune juice, chicken broth) orally. He continues relying on feeding tube via pump. Patient has tried drinking Boost VHC orally to begin weaning from tube, however mouth is dry and thickness of supplement is not tolerated. Patient has not been evaluated by [facility]. Wife reports therapist quit and she was the only one with company. Patient has continued doing exercises given by outpt [facility] [place]). He does these daily. Patient working with [facility] PT. Feels this is helping with strength. PT has not assessed lymphedema which appears present at visit. Constipation managed with miralax  and prune juice. Patient reports nausea overnight. Takes zofran  as needed.   Medications: reviewed   Labs: glucose 124  Anthropometrics: Wt 170 lb today (hospital weights omitted)   11/12 - 172 lb  11/5 - 172 lb 4.8 oz 10/29 - 173 lb 11.2 oz   Estimated Energy Needs  Kcals: 2160-2330 Protein: 99-116 Fluid: >2.1 L  NUTRITION DIAGNOSIS: Predicted sub-optimal intake - addressing with TF   INTERVENTION:  Continue Nutren 1.5 via pump 65 ml/hr x 24h - provides 2438 kcal, 111 g protein, 1177 ml free water  Encourage oral intake as tolerated Continue bowel regimen, suggested daily benefiber  Continue completing swallowing exercises Suggested pt to have PT evaluate for lymphedema  Suggested trying compazine  at night vs zofran  given history of constipation Support and encouragement     MONITORING, EVALUATION, GOAL: wt trends, intake   NEXT VISIT: Tuesday December 30 via telephone

## 2024-08-30 NOTE — Telephone Encounter (Signed)
 Spoke with patient and wife order was placed on the 12/08 takes few business days to process   Someone from the company will reach out

## 2024-08-30 NOTE — Telephone Encounter (Signed)
 Copied from CRM #[id number]. Topic: Clinical - Order For Equipment >> Aug 30, 2024  9:55 AM Devaughn RAMAN wrote: Reason for CRM: Pt's wife Heron calling regarding nebulizer machine, she stated they have not heard from DME company regarding nebulizer machine, pt was seen on 12.8.25 with Dr.Dewald.

## 2024-08-31 ENCOUNTER — Telehealth: Payer: Self-pay | Admitting: Oncology

## 2024-08-31 NOTE — Telephone Encounter (Signed)
 I SPOKE WITH PATIENT'S SPOUSE TO SCHEDULE 7 WEEK FOLLOW UP LAB AND MD OV. PATIENT AND SPOUSE AWARE OF DATE/TIME.

## 2024-09-02 DIAGNOSIS — J45909 Unspecified asthma, uncomplicated: Secondary | ICD-10-CM | POA: Diagnosis not present

## 2024-09-02 DIAGNOSIS — E871 Hypo-osmolality and hyponatremia: Secondary | ICD-10-CM | POA: Diagnosis not present

## 2024-09-02 DIAGNOSIS — Z7982 Long term (current) use of aspirin: Secondary | ICD-10-CM | POA: Diagnosis not present

## 2024-09-02 DIAGNOSIS — E876 Hypokalemia: Secondary | ICD-10-CM | POA: Diagnosis not present

## 2024-09-02 DIAGNOSIS — Z9181 History of falling: Secondary | ICD-10-CM | POA: Diagnosis not present

## 2024-09-02 DIAGNOSIS — J189 Pneumonia, unspecified organism: Secondary | ICD-10-CM | POA: Diagnosis not present

## 2024-09-02 DIAGNOSIS — E440 Moderate protein-calorie malnutrition: Secondary | ICD-10-CM | POA: Diagnosis not present

## 2024-09-02 DIAGNOSIS — I2510 Atherosclerotic heart disease of native coronary artery without angina pectoris: Secondary | ICD-10-CM | POA: Diagnosis not present

## 2024-09-02 DIAGNOSIS — Z8546 Personal history of malignant neoplasm of prostate: Secondary | ICD-10-CM | POA: Diagnosis not present

## 2024-09-02 DIAGNOSIS — I10 Essential (primary) hypertension: Secondary | ICD-10-CM | POA: Diagnosis not present

## 2024-09-02 DIAGNOSIS — C109 Malignant neoplasm of oropharynx, unspecified: Secondary | ICD-10-CM | POA: Diagnosis not present

## 2024-09-05 ENCOUNTER — Ambulatory Visit: Admitting: Radiology

## 2024-09-05 DIAGNOSIS — Z9181 History of falling: Secondary | ICD-10-CM | POA: Diagnosis not present

## 2024-09-05 DIAGNOSIS — J45909 Unspecified asthma, uncomplicated: Secondary | ICD-10-CM | POA: Diagnosis not present

## 2024-09-05 DIAGNOSIS — I10 Essential (primary) hypertension: Secondary | ICD-10-CM | POA: Diagnosis not present

## 2024-09-05 DIAGNOSIS — J189 Pneumonia, unspecified organism: Secondary | ICD-10-CM | POA: Diagnosis not present

## 2024-09-05 DIAGNOSIS — E871 Hypo-osmolality and hyponatremia: Secondary | ICD-10-CM | POA: Diagnosis not present

## 2024-09-05 DIAGNOSIS — E876 Hypokalemia: Secondary | ICD-10-CM | POA: Diagnosis not present

## 2024-09-05 DIAGNOSIS — I2510 Atherosclerotic heart disease of native coronary artery without angina pectoris: Secondary | ICD-10-CM | POA: Diagnosis not present

## 2024-09-05 DIAGNOSIS — C109 Malignant neoplasm of oropharynx, unspecified: Secondary | ICD-10-CM | POA: Diagnosis not present

## 2024-09-05 DIAGNOSIS — E440 Moderate protein-calorie malnutrition: Secondary | ICD-10-CM | POA: Diagnosis not present

## 2024-09-05 DIAGNOSIS — Z8546 Personal history of malignant neoplasm of prostate: Secondary | ICD-10-CM | POA: Diagnosis not present

## 2024-09-05 DIAGNOSIS — Z7982 Long term (current) use of aspirin: Secondary | ICD-10-CM | POA: Diagnosis not present

## 2024-09-05 NOTE — Therapy (Signed)
 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, follwed by [facility]   Dyspnea    with exertion   GERD (gastroesophageal reflux disease)    Hyperlipidemia    Hypertension    Pneumonia    Past Surgical History:  Procedure Laterality Date   CATARACT EXTRACTION Bilateral    IR GASTROSTOMY TUBE MOD SED  06/23/2024   IR IMAGING GUIDED PORT INSERTION  06/23/2024   LEFT HEART CATH AND CORONARY ANGIOGRAPHY N/A 06/05/2022   Procedure: LEFT HEART CATH AND CORONARY ANGIOGRAPHY;  Surgeon: Claudene Victory ORN, MD;  Location: [facility] INVASIVE CV LAB;  Service: Cardiovascular;  Laterality: N/A;   LYMPHADENECTOMY Bilateral 04/29/2023   Procedure: BILATERAL PELVIC LYMPHADENECTOMY;  Surgeon: Renda Glance, MD;  Location: [facility] ORS;  Service: Urology;  Laterality: Bilateral;   PROSTATE BIOPSY     ROBOT ASSISTED LAPAROSCOPIC RADICAL PROSTATECTOMY N/A 04/29/2023   Procedure: XI ROBOTIC ASSISTED LAPAROSCOPIC RADICAL PROSTATECTOMY LEVEL 2;  Surgeon: Renda Glance, MD;  Location: [facility] ORS;  Service: Urology;  Laterality: N/A;  210 MINUTES NEEDED FOR CASE   UPPER GASTROINTESTINAL ENDOSCOPY     VENTRAL HERNIA REPAIR N/A 10/05/2023   Procedure: LAPAROSCOPIC VENTRAL HERNIA REPAAIR WITH MESH;  Surgeon: Rubin Calamity, MD;  Location: [facility] OR;  Service: General;  Laterality: N/A;   Patient Active Problem List   Diagnosis Date Noted   Malnutrition of moderate degree 08/16/2024   Diarrhea 08/15/2024   Antineoplastic chemotherapy induced pancytopenia 08/15/2024   HCAP (healthcare-associated pneumonia)  08/14/2024   Sepsis (HCC) 08/11/2024   Protein-calorie malnutrition, severe 08/09/2024   Sepsis due to undetermined organism (HCC) 08/07/2024   Neutropenia with fever 08/07/2024   Anemia 08/07/2024   Hyponatremia 08/07/2024   Moderate protein malnutrition 08/07/2024   Transaminitis 08/07/2024   Oral thrush 08/07/2024   Multifocal pneumonia 08/07/2024   Sinus tachycardia 08/07/2024   Hypomagnesemia 08/07/2024   Hypophosphatemia 08/07/2024   Neutropenia 08/07/2024   Leukopenia due to antineoplastic chemotherapy 07/26/2024   Cancer related pain 06/08/2024   Oropharyngeal cancer (HCC) 06/07/2024   Neck mass 04/23/2024   Genetic testing 04/23/2023   Malignant neoplasm of prostate (HCC) 02/24/2023   Dysphonia 11/25/2022   Coronary artery disease involving native coronary artery of native heart without angina pectoris    DOE (dyspnea on exertion)     ONSET DATE: See pertinent history below   REFERRING DIAG:  C10.9 (ICD-10-CM) - Primary squamous cell carcinoma of oropharynx (HCC)    THERAPY DIAG:  No diagnosis found.  Rationale for Evaluation and Treatment: Rehabilitation  SUBJECTIVE:   SUBJECTIVE STATEMENT: I know they (HEP) is important.  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 Hx: He presented to his PCP with complaints of chronic pharyngitis and a left submandibular mass that has persisted over the past year. CT scans were ordered. 04/06/24 CT neck/chest showed a 3.0 x 2.4 x 2.5 cm soft tissue mass centered in the left oropharynx at the glossotonsillar sulcus with  possible involvement of the adjacent tongue base. Scans also noted scattered tiny pulmonary nodules are unchanged and likely benign but with only 5 months of documented stability but with recommend attention on follow-up given his history of prostate cancer. 04/24/24 He saw Dr. Graig who performed a FNA biopsy of left neck. However, biopsy was  inconclusive. As a result, he then underwent a laryngoscopy with biopsy on 05/15/24 under the care of Dr. Lauralee. 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 Tumor is moderately differentiated with foci of poor differentiation and invasion into the striated muscle. Immunohistochemical studies are p16, p40, and HRHPV are positive. 05/09/24 MRI cervical spine revealing no evidence of metastatic disease. 04/26/24 PET showing FDG avid mass along the left tongue base/oropharynx with left cervical chain nodal metastatic disease with no suspicious pulmonary or mediastinal FDG uptake  Consult with Dr. Izell and Dr. Autumn 06/07/24. He will receive chemotherapy and radiation. Treatment plan: He will receive 35 fractions of radiation to his Oropharynx and bilateral neck with weekly chemotherapy. He will start on 06/27/24 and complete 08/14/24. Pretreatment procedures:06/23/24 PEG/PAC  PAIN:  Are you having pain? Yes: NPRS scale: 10/10, 10/10 Pain location: neck and lt throat  Pain description: burning Aggravating factors: swallowing Relieving factors: meds  FALLS: Has patient fallen in last 6 months?  No   PATIENT GOALS: Have normal swallowing  OBJECTIVE:  Note: Objective measures were completed at Evaluation unless otherwise noted.                                                                                                                            TREATMENT DATE:   09/06/24:  07/27/24: Pt having milkshakes, pudding, and Boost VHC for nutrition. Today with applesauce and water , pt did not exhibit oral deficits, nor overt s/sx of pharyngeal deficits, with small bites of applesauce. SLP shared about food journal with pt and he expressed he may or may not use the food journal depending on how his recovery is going. Right now cannot watch people eat. With HEP procedure, pt was mod independent. Asked if one exercise was recommended which  one would it be and SLP shared this was challenging as exercises target certain muscle groups. Pt has performed HEP at suboptimal frequency however at this point in the tx schedule this can be expected. SLP reminded pt about cycling through the exercises at this point in ChRT schedule.   06/22/24: Research states the risk for dysphagia increases due to radiation and/or chemotherapy treatment due to a variety of factors, so SLP educated the pt about the possibility of reduced/limited ability for PO intake during rad tx. SLP also educated pt regarding possible changes to swallowing musculature after rad tx, and why adherence to dysphagia HEP provided today and PO consumption was necessary to inhibit muscle fibrosis following rad tx and to mitigate muscle disuse atrophy. SLP informed pt why this would be detrimental to their swallowing status and  to their pulmonary health. Pt demonstrated understanding of these things to SLP. SLP encouraged pt to safely eat and drink as deep into their radiation/chemotherapy as possible to provide the best possible long-term swallowing outcome for pt.    SLP then developed an individualized HEP for pt involving oral and pharyngeal strengthening and ROM and pt was instructed how to perform these exercises, including SLP demonstration. After SLP demonstration, pt return demonstrated each exercise. SLP ensured pt performance was correct prior to educating pt on next exercise. Pt required usual min-mod cues faded to modified independent to perform HEP. Pt was instructed to complete this program 6-7 days/week, at least 2 times a day until 6 months after his or her last day of rad tx, and then x2 a week after that, indefinitely. Among other modifications for days when pt cannot functionally swallow, SLP also suggested pt to perform only non-swallowing tasks on the handout/HEP, and if necessary to cycle through the swallowing portion so the full program of exercises can be completed  instead of fatiguing on one of the swallowing exercises and being unable to perform the other swallowing exercises. SLP instructed that swallowing exercises should then be added back into the regimen as pt is able to do so.   PATIENT EDUCATION: Education details: late effects head/neck radiation on swallow function, HEP procedure, and modification to HEP when difficulty experienced with swallowing during and after radiation course Person educated: Patient Education method: Explanation, Demonstration, Verbal cues, and Handouts Education comprehension: verbalized understanding, returned demonstration, verbal cues required, and needs further education   ASSESSMENT:  CLINICAL IMPRESSION: Patient is a 72 y.o. M who was seen today for treatment of swallowing as they undergo radiation/chemoradiation therapy. Today pt ate applesauce and drank thin liquids without overt s/s oral or pharyngeal difficulty. At this time pt swallowing is deemed WNL/WFL with these POs. No oral or overt s/sx pharyngeal deficits, including aspiration were observed. There are no overt s/s aspiration PNA observed by SLP nor any reported by pt at this time. Data indicate that pt's swallow ability will likely decrease over the course of radiation/chemoradiation therapy and could very well decline over time following the conclusion of that therapy due to muscle disuse atrophy and/or muscle fibrosis. Pt will cont to need to be seen by SLP in order to assess safety of PO intake, assess the need for recommending any objective swallow assessment, and ensuring pt is correctly completing the individualized HEP.  OBJECTIVE IMPAIRMENTS: include dysphagia. These impairments are limiting patient from safety when swallowing. Factors affecting potential to achieve goals and functional outcome are none noted today. Patient will benefit from skilled SLP services to address above impairments and improve overall function.   REHAB POTENTIAL: Good      GOALS: Goals reviewed with patient? No   SHORT TERM GOALS: Target: 3rd total session   1. Pt will complete HEP with modified independence in 2 sessions Baseline: 07/27/24 Goal status: INITIAL   2.  pt will tell SLP why pt is completing HEP with modified independence Baseline:  Goal status: met   3.  pt will describe 3 overt s/s aspiration PNA with modified independence Baseline:  Goal status: INITIAL   4.  pt will tell SLP how a food journal could hasten return to a more normalized diet Baseline:  Goal status: met     LONG TERM GOALS: Target: 7th total session   1.  pt will complete HEP with independence over two visits Baseline:  Goal status: INITIAL  2.  pt will describe how to modify HEP over time, and the timeline associated with reduction in HEP frequency with modified independence over two sessions Baseline:  Goal status: INITIAL     PLAN:   SLP FREQUENCY:  once approx every 4 weeks   SLP DURATION:  7 sessions   PLANNED INTERVENTIONS: Aspiration precaution training, Pharyngeal strengthening exercises, Diet toleration management , Trials of upgraded texture/liquids, SLP instruction and feedback, Compensatory strategies, and Patient/family education, [phone] (treatment of swallowing dysfunction and/or oral function for feeding)   Yoltzin Barg, CCC-SLP 09/05/2024, 9:01 AM

## 2024-09-06 ENCOUNTER — Ambulatory Visit

## 2024-09-06 ENCOUNTER — Ambulatory Visit: Payer: Self-pay | Admitting: Physical Therapy

## 2024-09-06 ENCOUNTER — Ambulatory Visit: Attending: Radiation Oncology

## 2024-09-06 DIAGNOSIS — R1310 Dysphagia, unspecified: Secondary | ICD-10-CM | POA: Insufficient documentation

## 2024-09-06 DIAGNOSIS — R490 Dysphonia: Secondary | ICD-10-CM | POA: Diagnosis present

## 2024-09-06 NOTE — Patient Instructions (Signed)
   Signs of Aspiration Pneumonia   Chest pain/tightness Fever (can be low grade) Cough  With foul-smelling phlegm (sputum) With sputum containing pus or blood With greenish sputum Fatigue  Shortness of breath  Wheezing   **IF YOU HAVE THESE SIGNS, CONTACT YOUR DOCTOR OR GO TO THE EMERGENCY DEPARTMENT OR URGENT CARE AS SOON AS POSSIBLE**

## 2024-09-08 ENCOUNTER — Other Ambulatory Visit: Payer: Self-pay

## 2024-09-08 DIAGNOSIS — C109 Malignant neoplasm of oropharynx, unspecified: Secondary | ICD-10-CM

## 2024-09-08 NOTE — Progress Notes (Incomplete)
 " Radiation Oncology         [phone] ________________________________  Name: Gilbert Thompson MRN: [id number]  Date: 09/12/2024  DOB: Jul 10, 1951  Follow-Up Visit Note  CC: Windy Coy, MD  Windy Coy, MD  Diagnosis and Prior Radiotherapy:    No diagnosis found. ***    Cancer Staging  Malignant neoplasm of prostate [facility]) Staging form: Prostate, AJCC 8th Edition - Clinical stage from 01/19/2023: Stage IIC (cT2a, cN0, cM0, PSA: 6, Grade Group: 4) - Signed by Sherwood Rise, PA-C on 03/30/2023 Histopathologic type: Adenocarcinoma, NOS Stage prefix: Initial diagnosis Prostate specific antigen (PSA) range: Less than 10 Gleason primary pattern: 4 Gleason secondary pattern: 4 Gleason score: 8 Histologic grading system: 5 grade system Number of biopsy cores examined: 14 Number of biopsy cores positive: 5 Location of positive needle core biopsies: One side  Oropharyngeal cancer (HCC) Staging form: Pharynx - HPV-Mediated Oropharynx, AJCC 8th Edition - Clinical stage from 06/07/2024: Stage I (cT2, cN1, cM0, p16+) - Signed by Izell Domino, MD on 06/07/2024 Stage prefix: Initial diagnosis   Patient received 35 Gy to the oropharynx and completed this on 08/15/2024   Stage I (cT2, N1, 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   Narrative:  The patient returns today for routine follow-up. He completed his treatment on 08/15/2024      In the interim since he was last seen by us , he was admitted to the hospital for HCAP.  He was started on antibiotics and ultimately discharged on 08/16/2024 with home health.   ***                ALLERGIES:  has no known allergies.  Meds: Current Outpatient Medications  Medication Sig Dispense Refill   acetaminophen  (TYLENOL ) 325 MG tablet Take 2 tablets (650 mg total) by mouth every 6 (six) hours as  needed for mild pain (pain score 1-3) or fever (or Fever >/= 101). (Patient taking differently: Place 650 mg into feeding tube every 6 (six) hours as needed for mild pain (pain score 1-3) or fever (or Fever >/= 101).)     albuterol  (VENTOLIN  HFA) 108 (90 Base) MCG/ACT inhaler Inhale 1-2 puffs into the lungs 3 (three) times daily for 5 days, THEN 1-2 puffs every 4 (four) hours as needed for wheezing or shortness of breath. 6.7 g 0   allopurinol  (ZYLOPRIM ) 300 MG tablet Place 300 mg into feeding tube daily.     ALPRAZolam  (XANAX ) 1 MG tablet Place 1 mg into feeding tube daily as needed for anxiety or sleep. (Patient not taking: Reported on 08/30/2024)     antiseptic oral rinse (CORINZ) solution 7 mLs by Mouth Rinse route as needed (for a dry mouth). (Patient not taking: Reported on 08/30/2024)     arformoterol  (BROVANA ) 15 MCG/2ML NEBU Take 2 mLs (15 mcg total) by nebulization 2 (two) times daily. 120 mL 11   [Paused] aspirin  EC 81 MG tablet Take 81 mg by mouth daily. Swallow whole.     azithromycin  (ZITHROMAX ) 250 MG tablet      budesonide  (PULMICORT ) 0.25 MG/2ML nebulizer solution Take 2 mLs (0.25 mg total) by nebulization 2 (two) times daily. 120 mL 11   celecoxib (CELEBREX) 200 MG capsule Take 200 mg by mouth daily. (Patient not taking: Reported on 08/30/2024)     [Paused] ezetimibe  (ZETIA ) 10 MG tablet TAKE 1 TABLET BY MOUTH DAILY 90  tablet 3   finasteride  (PROSCAR ) 5 MG tablet 5 mg See admin instructions. 5 mg per tube once a day     fluticasone  (FLONASE ) 50 MCG/ACT nasal spray Place 2 sprays into both nostrils daily for 7 days. 11.1 mL 0   HYDROmorphone  (DILAUDID ) 2 MG tablet Take 1 tablet (2 mg total) by mouth every 6 (six) hours as needed for severe pain (pain score 7-10). 90 tablet 0   lidocaine  (XYLOCAINE ) 2 % solution Patient: Mix 1part 2% viscous lidocaine , 1part H20. Swish & swallow 10mL of diluted mixture, before meals and at bedtime, up to QID 200 mL 3   loperamide  HCl (IMODIUM ) 1  MG/7.5ML suspension Place 15 mLs (2 mg total) into feeding tube as needed for diarrhea or loose stools. (Patient not taking: Reported on 08/30/2024) 120 mL 0   losartan  (COZAAR ) 50 MG tablet Take 50 mg by mouth daily. (Patient not taking: Reported on 08/30/2024)     magic mouthwash (nystatin , lidocaine , diphenhydrAMINE , alum & mag hydroxide) suspension Swish and swallow 5 mLs by mouth 4 (four) times daily as needed for mouth pain. 140 mL 2   morphine  (MS CONTIN ) 30 MG 12 hr tablet Take 1 tablet (30 mg total) by mouth every 12 (twelve) hours. 60 tablet 0   nitroGLYCERIN  (NITROSTAT ) 0.4 MG SL tablet Place 0.4 mg under the tongue every 5 (five) minutes as needed for chest pain.     ondansetron  (ZOFRAN ) 8 MG tablet Take 1 tablet (8 mg total) by mouth every 8 (eight) hours as needed for nausea or vomiting. Start on the third day after cisplatin . (Patient taking differently: Place 8 mg into feeding tube every 8 (eight) hours as needed for nausea or vomiting. Start on the third day after cisplatin .) 30 tablet 1   pantoprazole  (PROTONIX ) 40 MG tablet Take 1 tablet (40 mg total) by mouth 2 (two) times daily. (Patient taking differently: Take 40 mg by mouth See admin instructions. 40 mg, per tube, two times a day) 180 tablet 3   polyethylene glycol (MIRALAX  / GLYCOLAX ) 17 g packet Place 17 g into feeding tube daily as needed for moderate constipation.     prochlorperazine  (COMPAZINE ) 10 MG tablet Take 1 tablet (10 mg total) by mouth every 6 (six) hours as needed (Nausea or vomiting). (Patient not taking: Reported on 08/30/2024) 30 tablet 1   [Paused] rosuvastatin  (CRESTOR ) 40 MG tablet Take 1 tablet (40 mg total) by mouth daily. 90 tablet 3   No current facility-administered medications for this visit.    Physical Findings: The patient is in no acute distress. Patient is alert and oriented. Wt Readings from Last 3 Encounters:  08/30/24 170 lb (77.1 kg)  08/28/24 169 lb 3.2 oz (76.7 kg)  08/16/24 178 lb 12.7  oz (81.1 kg)    vitals were not taken for this visit. .  General: Alert and oriented, in no acute distress HEENT: Head is normocephalic. Extraocular movements are intact. Oropharynx is notable for *** Neck: Neck is notable for *** Skin: Skin in treatment fields shows satisfactory healing *** Heart: Regular in rate and rhythm with no murmurs, rubs, or gallops. Chest: Clear to auscultation bilaterally, with no rhonchi, wheezes, or rales. Abdomen: Soft, nontender, nondistended, with no rigidity or guarding. Extremities: No cyanosis or edema. Lymphatics: see Neck Exam Psychiatric: Judgment and insight are intact. Affect is appropriate.   Lab Findings: Lab Results  Component Value Date   WBC 14.5 (H) 08/30/2024   HGB 9.3 (L) 08/30/2024  HCT 28.2 (L) 08/30/2024   MCV 89.0 08/30/2024   PLT 432 (H) 08/30/2024    Lab Results  Component Value Date   TSH 1.850 06/07/2024    Radiographic Findings: DG Chest 2 View Result Date: 08/25/2024 EXAM: 2 VIEW(S) XRAY OF THE CHEST 08/24/2024 09:47:33 AM COMPARISON: 08/14/2024 CLINICAL HISTORY: PNEUMONIA FINDINGS: LINES, TUBES AND DEVICES: Right Port-A-Cath stable in position. LUNGS AND PLEURA: Patchy airspace opacities in right lung. Persistent nodular opacity in right upper lung. No pleural effusion. No pneumothorax. HEART AND MEDIASTINUM: No acute abnormality of the cardiac and mediastinal silhouettes. BONES AND SOFT TISSUES: Thoracic degenerative changes. IMPRESSION: 1. Patchy airspace opacities in the right lung, consistent with pneumonia. 2. Persistent nodular opacity in the right upper lung. Electronically signed by: Evalene Coho MD 08/25/2024 02:26 PM EST RP Workstation: [id number]   ECHOCARDIOGRAM COMPLETE Result Date: 08/15/2024    ECHOCARDIOGRAM REPORT   Patient Name:   KIRK BASQUEZ [facility] Date of Exam: 08/15/2024 Medical Rec #:  [id number]       Height:       69.0 in Accession #:    [id number]      Weight:       177.9 lb Date of Birth:   Jun 02, 1951       BSA:          1.966 m Patient Age:    73 years        BP:           113/68 mmHg Patient Gender: M               HR:           93 bpm. Exam Location:  Inpatient Procedure: 2D Echo, 3D Echo, Color Doppler, Cardiac Doppler and Strain Analysis            (Both Spectral and Color Flow Doppler were utilized during            procedure). Indications:    CHF- Acute Diastolic I50.31  History:        Patient has prior history of Echocardiogram examinations, most                 recent 05/04/2022. CAD, hx of cancer, Signs/Symptoms:Shortness of                 Breath and Dyspnea; Risk Factors:Hypertension and Dyslipidemia.  Sonographer:    Koleen Popper RDCS Referring Phys: [id number] MIR M [facility]  Sonographer Comments: No subcostal window. Global longitudinal strain was attempted. IMPRESSIONS  1. Left ventricular ejection fraction, by estimation, is 60 to 65%. The left ventricle has normal function. The left ventricle has no regional wall motion abnormalities. There is mild concentric left ventricular hypertrophy. Indeterminate diastolic filling due to E-A fusion.  2. Right ventricular systolic function is normal. The right ventricular size is normal.  3. The mitral valve is grossly normal. No evidence of mitral valve regurgitation. No evidence of mitral stenosis.  4. The aortic valve is tricuspid. There is mild calcification of the aortic valve. Aortic valve regurgitation is not visualized. Aortic valve sclerosis is present, with no evidence of aortic valve stenosis. FINDINGS  Left Ventricle: Left ventricular ejection fraction, by estimation, is 60 to 65%. The left ventricle has normal function. The left ventricle has no regional wall motion abnormalities. Global longitudinal strain performed but not reported based on interpreter judgement due to suboptimal tracking. 3D ejection fraction reviewed and evaluated as part of the interpretation. Alternate measurement of EF is felt  to be most reflective of LV  function. The left ventricular internal cavity size was normal in  size. There is mild concentric left ventricular hypertrophy. Indeterminate diastolic filling due to E-A fusion. Right Ventricle: The right ventricular size is normal. No increase in right ventricular wall thickness. Right ventricular systolic function is normal. Left Atrium: Left atrial size was normal in size. Right Atrium: Right atrial size was normal in size. Pericardium: Trivial pericardial effusion is present. Presence of epicardial fat layer. Mitral Valve: The mitral valve is grossly normal. No evidence of mitral valve regurgitation. No evidence of mitral valve stenosis. Tricuspid Valve: The tricuspid valve is grossly normal. Tricuspid valve regurgitation is trivial. No evidence of tricuspid stenosis. Aortic Valve: The aortic valve is tricuspid. There is mild calcification of the aortic valve. Aortic valve regurgitation is not visualized. Aortic valve sclerosis is present, with no evidence of aortic valve stenosis. Pulmonic Valve: The pulmonic valve was grossly normal. Pulmonic valve regurgitation is not visualized. No evidence of pulmonic stenosis. Aorta: The aortic root and ascending aorta are structurally normal, with no evidence of dilitation. Venous: The inferior vena cava was not well visualized. IAS/Shunts: The atrial septum is grossly normal. Additional Comments: 3D was performed not requiring image post processing on an independent workstation and was indeterminate.  LEFT VENTRICLE PLAX 2D LVIDd:         4.30 cm      Diastology LVIDs:         2.70 cm      LV e' medial:    7.07 cm/s LV PW:         1.10 cm      LV E/e' medial:  12.6 LV IVS:        1.20 cm      LV e' lateral:   11.00 cm/s LVOT diam:     2.20 cm      LV E/e' lateral: 8.1 LV SV:         69 LV SV Index:   35 LVOT Area:     3.80 cm                              3D Volume EF: LV Volumes (MOD)            3D EF:        60 % LV vol d, MOD A2C: 110.0 ml LV EDV:       155 ml LV vol  s, MOD A2C: 41.2 ml  LV ESV:       62 ml LV SV MOD A2C:     68.8 ml  LV SV:        93 ml RIGHT VENTRICLE RV Basal diam:  3.90 cm RV S prime:     15.30 cm/s TAPSE (M-mode): 2.0 cm LEFT ATRIUM             Index        RIGHT ATRIUM           Index LA diam:        4.00 cm 2.03 cm/m   RA Area:     13.00 cm LA Vol (A2C):   38.8 ml 19.74 ml/m  RA Volume:   30.10 ml  15.31 ml/m LA Vol (A4C):   36.5 ml 18.57 ml/m LA Biplane Vol: 37.8 ml 19.23 ml/m  AORTIC VALVE LVOT Vmax:   101.00 cm/s LVOT Vmean:  67.900 cm/s LVOT VTI:  0.181 m  AORTA Ao Root diam: 3.60 cm Ao Asc diam:  3.80 cm MITRAL VALVE                TRICUSPID VALVE MV Area (PHT): 3.65 cm     TR Peak grad:   15.5 mmHg MV Decel Time: 208 msec     TR Vmax:        197.00 cm/s MV E velocity: 88.90 cm/s MV A velocity: 132.00 cm/s  SHUNTS MV E/A ratio:  0.67         Systemic VTI:  0.18 m                             Systemic Diam: 2.20 cm Darryle Decent MD Electronically signed by Darryle Decent MD Signature Date/Time: 08/15/2024/9:48:46 AM    Final    UE VENOUS DUPLEX (7am - 7pm) Result Date: 08/14/2024 UPPER VENOUS STUDY  Patient Name:  PAULINO CORK  Date of Exam:   08/14/2024 Medical Rec #: [id number]        Accession #:    [id number] Date of Birth: Apr 29, 1951        Patient Gender: M Patient Age:   43 years Exam Location:  [facility] Procedure:      VAS US  UPPER EXTREMITY VENOUS DUPLEX Referring Phys: ADAM CURATOLO --------------------------------------------------------------------------------  Indications: Swelling Risk Factors: Primary squamous cell carcinoma of oropharynx, chemotherapy/radiation. Limitations: Body habitus and poor ultrasound/tissue interface. Comparison Study: No previous exams Performing Technologist: Alberta Lis RVS  Examination Guidelines: A complete evaluation includes B-mode imaging, spectral Doppler, color Doppler, and power Doppler as needed of all accessible portions of each vessel. Bilateral testing is considered an  integral part of a complete examination. Limited examinations for reoccurring indications may be performed as noted.  Right Findings: +----------+------------+---------+-----------+----------+-----------------+ RIGHT     CompressiblePhasicitySpontaneousProperties     Summary      +----------+------------+---------+-----------+----------+-----------------+ Subclavian               Yes       Yes              patent by doppler +----------+------------+---------+-----------+----------+-----------------+  Left Findings: +----------+------------+---------+-----------+----------+--------------------+ LEFT      CompressiblePhasicitySpontaneousProperties      Summary        +----------+------------+---------+-----------+----------+--------------------+ IJV                      Yes       Yes                   patent by                                                              color/doppler     +----------+------------+---------+-----------+----------+--------------------+ Subclavian               Yes       Yes                   patent by  color/doppler     +----------+------------+---------+-----------+----------+--------------------+ Axillary                 Yes       Yes                   patent by                                                              color/doppler     +----------+------------+---------+-----------+----------+--------------------+ Brachial      Full       Yes       Yes                                   +----------+------------+---------+-----------+----------+--------------------+ Radial        Full                                                       +----------+------------+---------+-----------+----------+--------------------+ Ulnar         Full                                                        +----------+------------+---------+-----------+----------+--------------------+ Cephalic      Full                                                       +----------+------------+---------+-----------+----------+--------------------+ Basilic                                                Not visualized    +----------+------------+---------+-----------+----------+--------------------+ enlarged lymph nodes seen in neck (left side).  Summary:  Right: No evidence of thrombosis in the subclavian.  Left: No evidence of deep vein thrombosis in the upper extremity. No evidence of superficial vein thrombosis in the upper extremity. However, unable to visualize the basilic vein. Subcutaneous edema throughout upper extremity.  *See table(s) above for measurements and observations.  Diagnosing physician: Lonni Gaskins MD Electronically signed by Lonni Gaskins MD on 08/14/2024 at 4:59:56 PM.    Final    CT Angio Chest PE W and/or Wo Contrast Result Date: 08/14/2024 CLINICAL DATA:  Weakness, fever, diarrhea, head and neck cancer EXAM: CT ANGIOGRAPHY CHEST WITH CONTRAST TECHNIQUE: Multidetector CT imaging of the chest was performed using the standard protocol during bolus administration of intravenous contrast. Multiplanar CT image reconstructions and MIPs were obtained to evaluate the vascular anatomy. RADIATION DOSE REDUCTION: This exam was performed according to the departmental dose-optimization program which includes automated exposure control, adjustment of the mA and/or kV according to patient size and/or use of iterative reconstruction technique. CONTRAST:  75mL OMNIPAQUE  IOHEXOL  350 MG/ML SOLN COMPARISON:  08/07/2024 FINDINGS: Cardiovascular: This is a technically adequate evaluation of the pulmonary vasculature. No filling defects or pulmonary emboli. The heart is unremarkable without pericardial effusion. No evidence of thoracic aortic aneurysm or dissection. Atherosclerosis of the aorta and  coronary vasculature. Right chest wall port via internal jugular approach tip within the superior vena cava. Mediastinum/Nodes: Right hilar adenopathy measuring up to 1.3 cm in short axis. No other pathologic adenopathy within the mediastinum or axillary regions. Thyroid , trachea, and esophagus are unremarkable. Lungs/Pleura: Since the previous exam, there has been progression of the multifocal bilateral airspace disease, greatest in the right upper lobe. Interval development of trace bilateral pleural effusions. No pneumothorax. The central airways are patent. Upper Abdomen: Percutaneous gastrostomy tube within the lumen of the gastric antrum. No acute upper abdominal findings. Musculoskeletal: No acute or destructive bony abnormalities. Reconstructed images demonstrate no additional findings. Review of the MIP images confirms the above findings. IMPRESSION: 1. No evidence of pulmonary embolus. 2. Progressive multifocal bilateral airspace disease, greatest in the right upper lobe, consistent with bilateral bronchopneumonia. 3. Trace bilateral parapneumonic effusions. 4. Stable right hilar lymphadenopathy. 5. Aortic Atherosclerosis (ICD10-I70.0). Coronary artery atherosclerosis. The Electronically Signed   By: Ozell Daring M.D.   On: 08/14/2024 16:08   DG Chest Portable 1 View Result Date: 08/14/2024 CLINICAL DATA:  Shortness of breath. EXAM: PORTABLE CHEST 1 VIEW COMPARISON:  Chest radiograph dated 08/07/2024. FINDINGS: Right-sided Port-A-Cath with tip at the cavoatrial junction. Right mid to lower lung field hazy density may represent atelectasis or pneumonia. No pleural effusion pneumothorax. Stable cardiac silhouette. No acute osseous pathology. IMPRESSION: Right mid to lower lung field atelectasis or pneumonia. Electronically Signed   By: Vanetta Chou M.D.   On: 08/14/2024 13:10   US  Abdomen Limited RUQ (LIVER/GB) Result Date: 08/11/2024 CLINICAL DATA:  Transaminitis EXAM: ULTRASOUND ABDOMEN  LIMITED RIGHT UPPER QUADRANT COMPARISON:  PET-CT February 22, 2023. FINDINGS: Gallbladder: No gallstones or wall thickening visualized. No sonographic Murphy sign noted by sonographer. Common bile duct: Diameter: 4 mm Liver: No focal lesion identified. Normal parenchymal echogenicity. Portal vein is patent on color Doppler imaging with normal direction of blood flow towards the liver. Other: Incidental note was made of right-sided pleural effusion. IMPRESSION: No focal liver lesion. Right small pleural effusion. Electronically Signed   By: Megan  Zare M.D.   On: 08/11/2024 14:45   DG Swallowing Func-Speech Pathology Result Date: 08/09/2024 Table formatting from the original result was not included. Modified Barium Swallow Study Patient Details Name: DEMPSEY AHONEN MRN: [id number] Date of Birth: November 20, 1950 Today's Date: 08/09/2024 HPI/PMH: HPI: pt is a 73 yo male adm to [facility] with hypotension, poor intake.  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 He is undergoing concurrent chemoradiation. Last chemotherapy with cisplatin  cycle 5 was on 07/26/2024. Further chemotherapy was withheld because of neutropenia and has been receiving Zarxio  support in our clinic. Pt received a PEG tube for nutrition and reports having consumed po and using feeding tube for nutrition. 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  He admits odynophagia and dysphagia are equal parts responsible for his poor intake. Pt CXR 08/07/2024: Patchy airspace opacities, especially in the lower lobes, but also in the  right upper lobe and right middle lobe potentially from atypical or multilobar pneumonia. Clinical Impression: Clinical Impression: Patient presents with mild oropharyngeal  dysphagia likely due to his oropharyngeal mass, iatrogenic effects of XRT and his bilateral vocal fold atrophy.  These combined issues with edema result in decreased airway closure allowing trace aspiration of thin liquid with nonproductive cough response.  Aspiration noted most especially occuring early in the study - as barium spilled into open airway prior to swallow being triggered.  SLP questions some component of warm up effect. Further episodes of laryngeal penetration were inconsistent and only trace.  Mild retention noted in pharynx (more at tongue base with solids) post-swallow due to decreased tongue base retraction and inadequate mastication. Pt c/o severe xerostomia causing foods to adhere to his oral structures  No aspiration or penetration with cracker, puree or honey thick liquids.   Pharyngeal clearance with puree was excellent, likely due to combined weight of bolus allowing improved neuro input.  Various postures were not helpful to mitigate retention or aspiration - including chin tuck *tucked approx 20* due to neck stiffness* and head turn left.  Liquid swallows are more effective than dry swallows which are not really functional.  At this time, recommend pt be able to start po diet with strict precautions.  If pt is reflexively coughing during intake, it is likely due to aspiration.  Given he has PEG tube for nutrition, recommend close monitoring of po diet via temperatures, lung sound monitoring, etc. Expect swallow to improve several week after XRT is completed. Factors that may increase risk of adverse event in presence of aspiration Noe & Lianne 2021): Factors that may increase risk of adverse event in presence of aspiration Noe & Lianne 2021): Reduced saliva; Poor general health and/or compromised immunity Recommendations/Plan: Swallowing Evaluation Recommendations Swallowing Evaluation Recommendations Recommendations: Dysphagia 1 (Pureed);(EXTRA GRAVY/SAUCES - CREAMY PUREES!)  Thin liquids (Level 0 )=  CONSIDER WATER  WITH MEALS Liquid Administration via: Cup; Straw Medication Administration: Crushed with puree (or via PEG) Swallowing strategies : Start intake with WATER ; Small bites/sips. Follow solid w/liquid. Stop intake if overtly coughing. Clear throat intermittently Treatment Plan Treatment Plan Treatment recommendations: Therapy as outlined in treatment plan below Follow-up recommendations: Follow physicians's recommendations for discharge plan and follow up therapies Functional status assessment: Patient has had a recent decline in their functional status and demonstrates the ability to make significant improvements in function in a reasonable and predictable amount of time. Treatment frequency: Min 1x/week Treatment duration: 1 week Interventions: Aspiration precaution training; Patient/family education; Oropharyngeal exercises; Respiratory muscle strength training Recommendations Recommendations for follow up therapy are one component of a multi-disciplinary discharge planning process, led by the attending physician.  Recommendations may be updated based on patient status, additional functional criteria and insurance authorization. Assessment: Orofacial Exam: Orofacial Exam Oral Cavity: Oral Hygiene: Xerostomia; Lingual coating; Edema; Erythema Oral Cavity - Dentition: Adequate natural dentition Orofacial Anatomy: Other (comment) (pt has known left tonsillar mass close to uvula and down to glossotonsillar sulcus) Anatomy: Anatomy: Suspected cervical osteophytes Boluses Administered: Boluses Administered Boluses Administered: Thin liquids (Level 0); Mildly thick liquids (Level 2, nectar thick); Puree; Moderately thick liquids (Level 3, honey thick); Solid  Oral Impairment Domain: Oral Impairment Domain Lip Closure: No labial escape Tongue control during bolus hold: Cohesive bolus between tongue to palatal seal Bolus preparation/mastication: Minimal chewing/mashing with majority of  bolus unchewed Bolus transport/lingual motion: Slow tongue motion Oral residue: Trace residue lining oral structures Location of oral residue : Palate; Tongue  Initiation of pharyngeal swallow : Posterior laryngeal surface of the epiglottis; Pyriform sinuses; Valleculae  Pharyngeal Impairment Domain: Pharyngeal Impairment Domain Soft palate elevation: No bolus between soft palate (SP)/pharyngeal wall (PW) Laryngeal elevation: Partial superior movement of thyroid  cartilage/partial approximation of arytenoids to epiglottic petiole Anterior hyoid excursion: Partial anterior movement Epiglottic movement: Partial inversion Laryngeal vestibule closure: Incomplete, narrow column air/contrast in laryngeal vestibule Pharyngeal stripping wave : Present - diminished Pharyngeal contraction (A/P view only): N/A Pharyngoesophageal segment opening: Partial distention/partial duration, partial obstruction of flow Tongue base retraction: Narrow column of contrast or air between tongue base and PPW Pharyngeal residue: Collection of residue within or on pharyngeal structures; Trace residue within or on pharyngeal structures Location of pharyngeal residue: Tongue base; Valleculae (tongue base and vallecular more than pharyngeal wall and pyriform sinus)  Esophageal Impairment Domain: Esophageal Impairment Domain Esophageal clearance upright position: Esophageal retention Pill: Pill Consistency administered: -- (DNT) Penetration/Aspiration Scale Score: Penetration/Aspiration Scale Score 1.  Material does not enter airway: Moderately thick liquids (Level 3, honey thick); Puree; Solid 2.  Material enters airway, remains ABOVE vocal cords then ejected out: Mildly thick liquids (Level 2, nectar thick) 7.  Material enters airway, passes BELOW cords and not ejected out despite cough attempt by patient: Thin liquids (Level 0) Compensatory Strategies: Compensatory Strategies Compensatory strategies: Yes Chin tuck: Ineffective Ineffective Chin  Tuck: Thin liquid (Level 0) Liquid wash: Effective Left head turn: Ineffective Ineffective Left Head Turn: Mildly thick liquid (Level 2, nectar thick)   General Information: Caregiver present: Yes  Diet Prior to this Study: NPO; Other (Comment) (ice chips)   Temperature : Normal   Respiratory Status: WFL   Supplemental O2: None (Room air)   History of Recent Intubation: No  Behavior/Cognition: Alert; Cooperative; Pleasant mood Self-Feeding Abilities: Able to self-feed Baseline vocal quality/speech: Dysphonic Volitional Cough: Able to elicit Volitional Swallow: Able to elicit Exam Limitations: No limitations Goal Planning: Prognosis for improved oropharyngeal function: Good No data recorded No data recorded Patient/Family Stated Goal: He admits odynophagia and dysphagia are equal parts responsible for his poor intake. No data recorded Pain: Pain Assessment Pain Assessment: No/denies pain Faces Pain Scale: 2 Pain Location: oropharynx Pain Descriptors / Indicators: Aching; Burning Pain Intervention(s): Limited activity within patient's tolerance; Other (comment) (RN provided medication during session) End of Session: Start Time:SLP Start Time (ACUTE ONLY): 1210 Stop Time: SLP Stop Time (ACUTE ONLY): 1235 Time Calculation:SLP Time Calculation (min) (ACUTE ONLY): 25 min Charges: SLP Evaluations $ SLP Speech Visit: 1 Visit SLP Evaluations $BSS Swallow: 1 Procedure $MBS Swallow: 1 Procedure $Swallowing Treatment: 1 Procedure SLP visit diagnosis: SLP Visit Diagnosis: Dysphagia, oropharyngeal phase (R13.12) Past Medical History: Past Medical History: Diagnosis Date  Asthma   Cancer (HCC)   prostate cancer  Coronary artery disease   pt states they were unable to place a stent at that time  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, follwed by [facility]  Dyspnea   with exertion  GERD (gastroesophageal reflux disease)   Hyperlipidemia   Hypertension   Pneumonia  Past Surgical History: Past Surgical History: Procedure Laterality  Date  CATARACT EXTRACTION Bilateral   IR GASTROSTOMY TUBE MOD SED  06/23/2024  IR IMAGING GUIDED PORT INSERTION  06/23/2024  LEFT HEART CATH AND CORONARY ANGIOGRAPHY N/A 06/05/2022  Procedure: LEFT HEART CATH AND CORONARY ANGIOGRAPHY;  Surgeon: Claudene Victory ORN, MD;  Location: [facility];  Service: Cardiovascular;  Laterality: N/A;  LYMPHADENECTOMY Bilateral 04/29/2023  Procedure: BILATERAL PELVIC LYMPHADENECTOMY;  Surgeon:  Renda Glance, MD;  Location: [facility];  Service: Urology;  Laterality: Bilateral;  PROSTATE BIOPSY    ROBOT ASSISTED LAPAROSCOPIC RADICAL PROSTATECTOMY N/A 04/29/2023  Procedure: XI ROBOTIC ASSISTED LAPAROSCOPIC RADICAL PROSTATECTOMY LEVEL 2;  Surgeon: Renda Glance, MD;  Location: [facility];  Service: Urology;  Laterality: N/A;  210 MINUTES NEEDED FOR CASE  UPPER GASTROINTESTINAL ENDOSCOPY    VENTRAL HERNIA REPAIR N/A 10/05/2023  Procedure: LAPAROSCOPIC VENTRAL HERNIA REPAAIR WITH MESH;  Surgeon: Rubin Calamity, MD;  Location: [facility] OR;  Service: General;  Laterality: N/A; [name], MS [facility] SLP Acute Rehab Services Office [phone] [name] 08/09/2024, 2:55 PM   Impression/Plan:  Stage I (cT2, N1, 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 ***  6) Thyroid  function:  Lab Results  Component Value Date   TSH 1.850 06/07/2024    7) Other: ***  8) Follow-up in *** months. The patient was encouraged to call with any issues or questions before then.  On date of service, in total,  I spent *** minutes on this encounter. Patient was seen in person. _____________________________________    Leeroy Due, PA-C   "

## 2024-09-11 ENCOUNTER — Other Ambulatory Visit (HOSPITAL_COMMUNITY): Payer: Self-pay | Admitting: Radiation Oncology

## 2024-09-11 ENCOUNTER — Telehealth (HOSPITAL_COMMUNITY): Payer: Self-pay

## 2024-09-11 DIAGNOSIS — R059 Cough, unspecified: Secondary | ICD-10-CM

## 2024-09-11 DIAGNOSIS — R1310 Dysphagia, unspecified: Secondary | ICD-10-CM

## 2024-09-11 NOTE — Telephone Encounter (Signed)
 Attempted to reach patient at preferred number of [phone] [facility]) to schedule swallow study. Left voicemail and request for call back.

## 2024-09-11 NOTE — Progress Notes (Incomplete)
 Patient identity verified x2  Patient *** is here for a follow-up appointment today- *** for: ***  Treatment Completion Date: *** Pain issues, if any: *** Using a feeding tube?: *** Weight changes, if any: *** Swallowing issues, if any: *** Smoking or chewing tobacco? *** Using fluoride  toothpaste daily? *** Last ENT visit was on: *** Other notable issues, if any: ***

## 2024-09-12 ENCOUNTER — Ambulatory Visit
Admission: RE | Admit: 2024-09-12 | Discharge: 2024-09-12 | Disposition: A | Discharge status: Home / Self Care | Source: Ambulatory Visit | Attending: Radiology | Admitting: Radiology

## 2024-09-12 ENCOUNTER — Inpatient Hospital Stay: Admitting: Dietician

## 2024-09-12 VITALS — HR 102 | Temp 96.4°F | Resp 16 | Ht 69.0 in | Wt 178.1 lb

## 2024-09-12 DIAGNOSIS — Z79899 Other long term (current) drug therapy: Secondary | ICD-10-CM | POA: Diagnosis not present

## 2024-09-12 DIAGNOSIS — C109 Malignant neoplasm of oropharynx, unspecified: Secondary | ICD-10-CM | POA: Insufficient documentation

## 2024-09-12 DIAGNOSIS — G893 Neoplasm related pain (acute) (chronic): Secondary | ICD-10-CM | POA: Diagnosis not present

## 2024-09-12 DIAGNOSIS — C61 Malignant neoplasm of prostate: Secondary | ICD-10-CM | POA: Diagnosis not present

## 2024-09-12 DIAGNOSIS — Z7982 Long term (current) use of aspirin: Secondary | ICD-10-CM | POA: Insufficient documentation

## 2024-09-12 DIAGNOSIS — Z791 Long term (current) use of non-steroidal anti-inflammatories (NSAID): Secondary | ICD-10-CM | POA: Diagnosis not present

## 2024-09-12 DIAGNOSIS — Z9221 Personal history of antineoplastic chemotherapy: Secondary | ICD-10-CM | POA: Diagnosis not present

## 2024-09-12 DIAGNOSIS — Z7951 Long term (current) use of inhaled steroids: Secondary | ICD-10-CM | POA: Diagnosis not present

## 2024-09-12 DIAGNOSIS — Z923 Personal history of irradiation: Secondary | ICD-10-CM | POA: Diagnosis not present

## 2024-09-12 NOTE — Addendum Note (Signed)
 Encounter addended by: Wyatt Leeroy HERO, PA-C on: 09/12/2024 2:40 PM  Actions taken: Clinical Note Signed

## 2024-09-12 NOTE — Progress Notes (Signed)
 Nutrition Follow-up:  Pt with primary SCC of oropharynx, p16+.  He completed concurrent chemoradiation with weekly cisplatin  (final RT 11/24). Patient is under the care of Dr. Izell and Dr. Autumn.    S/p PEG - 10/3   11/17-11/22  admission with sepsis of undetermined organism 11/24 - 11/26 admission with HCAP  Met with patient and wife in office. He reports slowly healing. Reports short burst of good energy, then needing to rest. Continues to have some dyspnea that started with PNA. He has follow-up with cards next week. Patient tolerating liquids orally. Solids get stuck in mouth and unable to swallow. Currently drinking 3 Boost VHC along with prune juice, apple juice, and some water . Says he needs to do better with water . He continues to supplement oral intake with enteral nutrition via pump. Patient is ready to get rid of that thing.   Medications: reviewed   Labs: no new labs   Anthropometrics: Wt 178 lb 2 oz today - increased   12/10 - 170 lb  11/12 - 172 lb  11/5 - 172 lb 4.8 oz 10/29 - 173 lb 11.2 oz   Estimated Energy Needs  Kcals: 2160-2330 Protein: 99-116 Fluid: >2.1 L   NUTRITION DIAGNOSIS: Predicted sub-optimal intake - improving    INTERVENTION:  Will continue working with patient to wean from tube Pt to try increasing oral intake of Boost VHC - 5/day  Use tube if unable to drink orally  Continue daily PEG care and FWF Reviewed bolus feed as patient does not want to use pump - boley connector provided Continue bowel regimen - daily miralax  + prune juice Support and encouragement     MONITORING, EVALUATION, GOAL: weight trends, intake, TF    NEXT VISIT: Tuesday January 6 via telephone

## 2024-09-13 ENCOUNTER — Other Ambulatory Visit: Payer: Self-pay | Admitting: Hematology

## 2024-09-13 ENCOUNTER — Other Ambulatory Visit: Payer: Self-pay

## 2024-09-14 ENCOUNTER — Encounter: Payer: Self-pay | Admitting: Oncology

## 2024-09-14 NOTE — Assessment & Plan Note (Signed)
 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   CT scan from July 2025 showed lymph nodes with extranodal extension, indicating a higher risk of recurrence without appropriate treatment. The cancer is aggressive due to lymph node involvement but has not spread beyond the head and neck area.   cT2,cN1,cM0,p16+, Stage 1 disease.  Concern for extranodal extension based on imaging.  Previously I discussed staging, prognosis, plan of care, treatment options.  Reviewed NCCN guidelines.  Started concurrent chemoradiation with weekly cisplatin  from 06/27/2024.  Creatinine went up to 1.44 on 07/12/2024.  Dose reduced cisplatin  by 50% with cycle 3 as a result.  With normalization of creatinine, cisplatin  was resumed at standard dose from cycle 4 onwards.  Cycle 5 of chemotherapy was given on 07/26/2024.  Further chemotherapy held because of neutropenia.   He was hospitalized from 08/08/2023 until 08/12/2024 for sepsis secondary to bilateral aspiration pneumonia.   Completed radiation treatments on 08/15/2024.  He is in the post-treatment phase following chemotherapy and radiation, with gradual recovery anticipated over the next month. Recent complications included pneumonia and Clostridioides difficile infection, both improving. Laboratory evaluation revealed compensatory leukocytosis and thrombocytosis, with persistent anemia. Liver enzymes, previously elevated, are trending toward normalization. - Planned PET scan after radiation oncology visit to assess treatment response. - Scheduled hematology/oncology follow-up for end of January.

## 2024-09-15 ENCOUNTER — Encounter: Payer: Self-pay | Admitting: Oncology

## 2024-09-17 ENCOUNTER — Encounter: Payer: Self-pay | Admitting: Physical Therapy

## 2024-09-17 NOTE — Progress Notes (Unsigned)
 " Cardiology Office Note:  .   Date:  09/19/2024  ID:  Gilbert Thompson, DOB 1951-04-29, MRN [id number] PCP: Windy Coy, MD  [facility] Providers Cardiologist:  Redell Shallow, MD {  History of Present Illness: .   Gilbert Thompson is a 74 y.o. male with history of CAD with medically managed disease and LAD 05/2022, hypertension, hyperlipidemia, lung nodule, SCC of the left tonsil/tongue, prostate cancer     CAD 04/2022 CCTA CAC score 620.  Moderate to severe stenosis in the mid LAD with otherwise mild nonobstructive disease.  FFR abnormal in the LAD distribution.  Echo preserved LVEF.  Severe LVH.  Mild MR. 05/2022 LHC with 75% followed by 70% lesions in LAD.  Medical management.  High risk PCI. 04/2024 beta-blocker previously discontinued due to bradycardia.  Social history  No history of tobacco.  1-2 drinks per month. No drugs.  Father with MI.     Patient with history LHC showing 75% stenosis in LAD medically managed given high risk for PCI.  Appears to be asymptomatic, last seen 04/2024 no DOE or exertional chest pain.  He was ambulating 3 to 4 miles playing golf.  Since then  he's had more issues with SCC of the oropharynx.  Has had 2x admissions for possible C. Difficile and multifocal pneumonia 07/2024.  Concerns of chronic dysphagia and malnutrition as well.  Requiring tube feeds.  Aspirin , Zetia , rosuvastatin  were held due to elevated LFTs and thrombocytopenia although both were improving by the time of discharge.  Today patient presents for follow-up.  He is accompanied with his wife.  They primarily wanted to go over medications and looking to restart some of their medications.  They have not been on aspirin , rosuvastatin , Zetia  since discharge.  Reports that in the meantime they have seen oncology reported they had started Celebrex for management of pain.  He also is taking Dilaudid  for this.  His primary complaint has been DOE x 2 months.  He has no orthopnea, peripheral  edema.  Has not had any chest pain whatsoever.  Voice is very hoarse.  They were concerned that SOB could be cardiac related.  Still on tube feeds.  ROS: Denies: Chest pain, orthopnea, peripheral edema, palpitations, fatigue, lightheadedness.   Studies Reviewed: .         Risk Assessment/Calculations:          Physical Exam:   VS:  BP 98/62   Pulse (!) 115   Ht 1' (0.305 m)   Wt 172 lb (78 kg)   SpO2 99%   BMI 839.79 kg/m    Wt Readings from Last 6 Encounters:  09/19/24 172 lb (78 kg)  09/12/24 178 lb 2 oz (80.8 kg)  08/30/24 170 lb (77.1 kg)  08/28/24 169 lb 3.2 oz (76.7 kg)  08/16/24 178 lb 12.7 oz (81.1 kg)  08/12/24 184 lb 4.9 oz (83.6 kg)    GEN: Well nourished, well developed in no acute distress NECK: No JVD; No carotid bruits CARDIAC: RRR, no murmurs, rubs, gallops RESPIRATORY:  Clear to auscultation without rales, wheezing or rhonchi  ABDOMEN: Soft, non-tender, non-distended EXTREMITIES:  No edema; No deformity   ASSESSMENT AND PLAN: .    SOB Reports symptoms going on for the last 2 months with recent hospitalization with multifocal pneumonia.  Followed by pulmonology with known history of moderate asthma.  Suspect shortness of breath primarily of pulmonary origins.  Echocardiogram during admission 07/2024 demonstrated preserved biventricular function with no significant valvular  disease.  Looks euvolemic on exam with no clinical findings of CHF.  Do not suspect this is related to CAD either as he has had known disease for last 2 years and onset of symptoms starting in the setting of hospitalization for multifocal pneumonia. Continue to follow-up with pulmonology and oncology. They have plans to repeat CT scan in upcoming months.  CAD Hyperlipidemia -LHC 05/2022 mid LAD lesion 75% followed by 75% stenosis. Per prior report, if unable to control symptoms on medical therapy, higher risk PCI could be performed with a relatively long stent in the proximal to mid  vessel that would jailed for small side branches.  No chest pain.  I do not think his SOB is related to CAD.  Either way I think he would be high risk PCI with underlying thrombocytopenia, anemia and with high risk anatomy per cath report.   Ideally would be on aspirin , Zetia , rosuvastatin  but he had issues with thrombocytopenia and elevated LFTs during admission although both of these were improving by the time of discharge.  Will check CBC and CMP today to ensure that this is close to normalizing before initiating therapy.  Will also get recommendations from his primary cardiologist. Additionally he is on Celebrex for management of throat pain/cancer, ideally would be treating with Tylenol .  Will discuss with his oncologist if he can stop this so we can continue aspirin  safely assuming that platelets are within reason as well. LDL 36 04/2024  Hypertension Blood pressure little bit soft.  98/62.  Does not seem to be symptomatic from this with no dizziness or falls.   Continue losartan  50 mg daily.  Continue to monitor, we can cut in half if BP becomes an issue.  Sinus tachycardia Seems to be somewhat chronic for him.  CTA recently negative for PE.  Lung nodule CT scan pending 09/2024  SCC of the left tonsil/lung Dysphagia with malnutrition Followed by oncology very closely, dependent on tube feeds.  No longer on chemotherapy due to neutropenia and pneumonia. Completed radiation 11/25.   Dispo: Follow-up as planned August 2026  Signed, Thom LITTIE Sluder, PA-C  "

## 2024-09-18 ENCOUNTER — Encounter: Payer: Self-pay | Admitting: Physical Therapy

## 2024-09-18 ENCOUNTER — Ambulatory Visit: Attending: Radiation Oncology | Admitting: Physical Therapy

## 2024-09-18 DIAGNOSIS — I890 Lymphedema, not elsewhere classified: Secondary | ICD-10-CM | POA: Diagnosis present

## 2024-09-18 DIAGNOSIS — M6281 Muscle weakness (generalized): Secondary | ICD-10-CM | POA: Insufficient documentation

## 2024-09-18 DIAGNOSIS — C109 Malignant neoplasm of oropharynx, unspecified: Secondary | ICD-10-CM | POA: Diagnosis present

## 2024-09-18 DIAGNOSIS — R293 Abnormal posture: Secondary | ICD-10-CM | POA: Diagnosis present

## 2024-09-18 NOTE — Therapy (Addendum)
 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, follwed by [facility]   Dyspnea    with exertion   GERD (gastroesophageal reflux disease)    Hyperlipidemia    Hypertension    Pneumonia    Past Surgical History:  Procedure Laterality Date   CATARACT EXTRACTION Bilateral    IR GASTROSTOMY TUBE MOD SED  06/23/2024   IR IMAGING GUIDED PORT INSERTION  06/23/2024   LEFT HEART CATH AND CORONARY ANGIOGRAPHY N/A 06/05/2022   Procedure: LEFT HEART CATH AND CORONARY ANGIOGRAPHY;  Surgeon: Claudene Victory ORN, MD;  Location: [facility];  Service: Cardiovascular;  Laterality: N/A;   LYMPHADENECTOMY Bilateral 04/29/2023   Procedure: BILATERAL PELVIC LYMPHADENECTOMY;  Surgeon: Renda Glance, MD;  Location: [facility];  Service: Urology;  Laterality: Bilateral;   PROSTATE BIOPSY     ROBOT ASSISTED LAPAROSCOPIC RADICAL PROSTATECTOMY N/A 04/29/2023   Procedure: XI ROBOTIC ASSISTED LAPAROSCOPIC RADICAL PROSTATECTOMY LEVEL 2;  Surgeon: Renda Glance, MD;  Location: [facility];  Service: Urology;  Laterality: N/A;  210 MINUTES NEEDED FOR CASE   UPPER GASTROINTESTINAL ENDOSCOPY     VENTRAL HERNIA REPAIR N/A 10/05/2023   Procedure: LAPAROSCOPIC VENTRAL HERNIA REPAAIR WITH MESH;  Surgeon: Rubin Calamity, MD;  Location: [facility];  Service: General;  Laterality: N/A;   Patient Active Problem List   Diagnosis Date Noted   Malnutrition of moderate  degree 08/16/2024   Diarrhea 08/15/2024   Antineoplastic chemotherapy induced pancytopenia 08/15/2024   HCAP (healthcare-associated pneumonia) 08/14/2024   Sepsis (HCC) 08/11/2024   Protein-calorie malnutrition, severe 08/09/2024   Sepsis due to undetermined organism (HCC) 08/07/2024   Neutropenia with fever 08/07/2024   Anemia 08/07/2024   Hyponatremia 08/07/2024   Moderate protein malnutrition 08/07/2024   Transaminitis 08/07/2024   Oral thrush 08/07/2024   Multifocal pneumonia 08/07/2024   Sinus tachycardia 08/07/2024   Hypomagnesemia 08/07/2024   Hypophosphatemia 08/07/2024   Neutropenia 08/07/2024   Leukopenia due to antineoplastic chemotherapy 07/26/2024   Cancer related pain 06/08/2024   Oropharyngeal cancer (HCC) 06/07/2024   Neck mass 04/23/2024   Genetic testing 04/23/2023   Malignant neoplasm of prostate (HCC) 02/24/2023   Dysphonia 11/25/2022   Coronary artery disease involving native coronary artery of native heart without angina pectoris    DOE (dyspnea on exertion)     PCP: Maude Sprague, MD   REFERRING PROVIDER: Lauraine Golden, MD   REFERRING DIAG: C10.9 (ICD-10-CM) - Primary squamous cell carcinoma of oropharynx (HCC)   THERAPY DIAG:  Abnormal posture  Lymphedema, not elsewhere classified  Muscle weakness (generalized)  Primary squamous cell carcinoma of oropharynx (HCC)  Rationale for Evaluation and Treatment: Rehabilitation  ONSET DATE: 05/15/24  SUBJECTIVE:  SUBJECTIVE STATEMENT: I am slowly getting better but my voice is very hoarse. I have shortness of breath after activity which is worse than usual. I have some swelling in my throat. I see the cardiologist tomorrow.   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 He presented to his  PCP with complaints of chronic pharyngitis and a left submandibular mass that has persisted over the past year. CT scans were ordered. 04/06/24 CT neck/chest showed a 3.0 x 2.4 x 2.5 cm soft tissue mass centered in the left oropharynx at the glossotonsillar sulcus with possible involvement of the adjacent tongue base. Scans also noted scattered tiny pulmonary nodules are unchanged and likely benign but with only 5 months of documented stability but with recommend attention on follow-up given his history of prostate cancer. 04/24/24 He saw Dr. Graig who performed a FNA biopsy of left neck. However, biopsy was inconclusive. As a result, he then underwent a laryngoscopy with biopsy on 05/15/24 under the care of Dr. Lauralee. 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 Tumor is moderately differentiated with foci of poor differentiation and invasion into the striated muscle. Immunohistochemical studies are p16, p40, and HRHPV are positive.  05/09/24 MRI cervical spine revealing no evidence of metastatic disease. 04/26/24 PET showing FDG avid mass along the left tongue base/oropharynx with left cervical chain nodal metastatic disease with no suspicious pulmonary or mediastinal FDG uptake. He will receive 35 fractions of radiation to his Oropharynx and bilateral neck with weekly chemotherapy. He will start on 06/27/24 and complete 08/14/24. 06/23/24. PEG/PAC. History of prostate cancer, no radiation had surgery. Pt reports 80% blockage of LAD. Hospitalized for pneumonia in Nov 2025.   PATIENT GOALS:  Reassess how my recovery is going related to neck ROM, cervical pain, fatigue, and swelling.  PAIN:  Are you having pain? Yes NPRS scale: 6/10 Pain location: throat Pain orientation: Left  PAIN TYPE: raw Pain description: constant  Aggravating factors: eating, brushing teeth Relieving factors: nothing  PRECAUTIONS: Recent radiation, Head and neck  lymphedema risk,    OBJECTIVE:   POSTURE:  Forward head and rounded shoulders posture  30 SEC SIT TO STAND: 09/18/24: 16 reps in 30 sec without use UEs which is good for patients age  Baseline 06/22/24: 13 reps in 30 sec without use of UEs which is  Average for patient's age.   SHOULDER AROM:   At baseline pt reported L shoulder was uncomfortable to sleep on but both shoulders were [facility]  CERVICAL AROM:     Percent limited 09/18/24  Flexion [facility] WFL  Extension 75% limited WFL  Right lateral flexion 75% limited 75% limited  Left lateral flexion 75% limited 85% limited  Right rotation 50% limited 50% limited  Left rotation 50% limited 50% limited                          (Blank rows=not tested)  LYMPHEDEMA ASSESSMENT:    Circumference in cm  4 cm superior to sternal notch around neck 39.2  6 cm superior to sternal notch around neck 39.5  8 cm superior to sternal notch around neck 41.2  R lateral nostril from base of nose to medial tragus   L lateral nostril from base of nose to medial tragus   R corner of mouth to where ear lobe meets face   L corner of mouth to where  ear lobe meets face         (Blank rows=not tested)  LE STRENGTH: Bilateral hips 4/5, bilateral knee extension 5/5, ankle DF 5/5  Neck Disability Index score: 7 / 50 = 14.0 %  BALANCE: SLS on R - 7 sec, on L - 20 sec  OTHER SYMPTOMS: Pain Yes Fibrosis Yes Pitting edema No Infections No Decreased scar mobility No  TREATMENT PERFORMED:  09/18/24: Created foam chip pack for pt to wear for lymphedema, educated pt in basic skin stretch technique for anterior neck    PATIENT EDUCATION:  Education details: hold on walking program and exercise until cleared by cardiology, work up to holding stretches for 30-60 sec, wear chip pack for 4-5 hrs/day, don't tie too tightly, basic skin stretch for anterior neck Person educated: Patient and Spouse Education method: Medical Illustrator Education  comprehension: verbalized understanding  HOME EXERCISE PROGRAM: Reviewed previously given post op HEP.   ASSESSMENT:  CLINICAL IMPRESSION: Pt returns to PT after completing chemo and radiation for treatment of base of tongue cancer. He has developed mild lymphedema of anterior neck with some mild fibrosis. Created a chip pack for pt to begin wearing to help soften swelling and reduce edema. Instructed pt in basic skin stretch technique and will instruct pt in entire [facility] anterior approach technique for anterior neck at next session. He has very limited neck ROM which was limited at baseline but his L neck flexion has decreased since baseline. He reports increased shortness of breath with activity and has to have recovery periods. He will see his cardiologist tomorrow. Pending clearance from the cardiologist he would benefit from strengthening exercises to help improve bilateral hip flexion strength and decrease fatigue. He will also benefit from skilled PT services to instruct tp in independent management of anterior neck lymphedema.   Pt will benefit from skilled therapeutic intervention to improve on the following deficits: decreased knowledge of condition, decreased knowledge of use of DME, decreased ROM, decreased strength, increased edema, increased fascial restrictions, postural dysfunction, and pain  PT treatment/interventions: ADL/Self care home management, [id number]- PT Re-evaluation, 97110-Therapeutic exercises, 97530- Therapeutic activity, [id number]- Neuromuscular re-education, 97535- Self Care, [id number]- Manual therapy, [id number]- Orthotic Initial, [id number]- Orthotic/Prosthetic subsequent, and Patient/Family education     GOALS: Goals reviewed with patient? Yes  GOALS MET AT EVAL:   Name Target Date  Goal status  1 Patient will be able to verbalize understanding of a home exercise program for cervical range of motion, posture, and walking.   Baseline:  No knowledge Eval Achieved at eval  2  Patient will be able to verbalize understanding of proper sitting and standing posture. Baseline:  No knowledge Eval Achieved at eval  3 Patient will be able to verbalize understanding of lymphedema risk and availability of treatment for this condition Baseline:  No knowledge Eval Achieved at eval     LONG TERM GOALS:  (STG=LTG)  GOALS Name Target Date  Goal status  1 Pt will demonstrate a return to baseline cervical ROM measurements and not demonstrate any signs or symptoms of lymphedema. Baseline: 10/16/24 NEW  2 Pt will be independent in self MLD for long term management of neck lymphedema. 10/16/24 NEW  3 Pt will be independent in a home exercise program for stretching and strengthening and for improved posture. 10/16/24 NEW  4 Pt will demonstrate 5/5 bilateral hip flexion strength to decrease fall risk. 10/16/24  NEW       PLAN:  PT FREQUENCY/DURATION: 2x/wk for 4 wks  PLAN FOR NEXT SESSION: instruct pt and spouse in self MLD for anterior neck lymphedema, does he want a head and neck garment?, posture exercises, neck ROM, eventually LE strengthening/balance pending clearance from cardiologist   [facility]  [address]  [place] [address]  [phone]   Scar massage You can begin gentle scar massage to you incision sites. Gently place one hand on the incision and move the skin (without sliding on the skin) in various directions. Do this for a few minutes and then you can gently massage either coconut oil or vitamin E cream into the scars.  Home exercise Program Continue doing the exercises you were given until you feel like you can do them without feeling any tightness at the end. It is best to do them for several months after completion of radiation since the effects of radiation continue past completion.   Walking Program Studies show that 30 minutes of walking per day (fast enough to elevate your heart rate) can significantly reduce  the risk of a cancer recurrence. If you can't walk due to other medical reasons, we encourage you to find another activity you could do (like a stationary bike or water  exercise).  Posture After treatment for head and neck cancer, people frequently sit with rounded shoulders and forward head posture because the front of the neck has become tight and it feels better. If you sit like this, you can become very tight and have pain in sitting or standing with good posture. Try to be aware of your posture and sit and stand up tall to heal properly.  Follow up PT: Please let you doctor know as soon as possible if you develop any swelling in your face or neck in the future. Lymphedema (swelling) can occur months after completion of radiation. The sooner you can let the doctor know, the sooner they can refer you back to PT. It is much easier to treat the swelling early on.   [facility] [place], PT 09/18/2024, 9:54 AM  "

## 2024-09-19 ENCOUNTER — Encounter: Payer: Self-pay | Admitting: Cardiology

## 2024-09-19 ENCOUNTER — Telehealth: Payer: Self-pay | Admitting: Pulmonary Disease

## 2024-09-19 ENCOUNTER — Inpatient Hospital Stay: Admitting: Dietician

## 2024-09-19 ENCOUNTER — Ambulatory Visit: Admitting: Cardiology

## 2024-09-19 ENCOUNTER — Ambulatory Visit: Payer: Self-pay | Admitting: Pulmonary Disease

## 2024-09-19 VITALS — BP 98/62 | HR 115 | Ht <= 58 in | Wt 172.0 lb

## 2024-09-19 DIAGNOSIS — I2510 Atherosclerotic heart disease of native coronary artery without angina pectoris: Secondary | ICD-10-CM | POA: Diagnosis not present

## 2024-09-19 DIAGNOSIS — J4540 Moderate persistent asthma, uncomplicated: Secondary | ICD-10-CM

## 2024-09-19 DIAGNOSIS — E785 Hyperlipidemia, unspecified: Secondary | ICD-10-CM | POA: Diagnosis not present

## 2024-09-19 DIAGNOSIS — R7989 Other specified abnormal findings of blood chemistry: Secondary | ICD-10-CM

## 2024-09-19 DIAGNOSIS — R0602 Shortness of breath: Secondary | ICD-10-CM

## 2024-09-19 DIAGNOSIS — D696 Thrombocytopenia, unspecified: Secondary | ICD-10-CM

## 2024-09-19 DIAGNOSIS — I10 Essential (primary) hypertension: Secondary | ICD-10-CM | POA: Diagnosis not present

## 2024-09-19 DIAGNOSIS — J4541 Moderate persistent asthma with (acute) exacerbation: Secondary | ICD-10-CM

## 2024-09-19 LAB — CBC
Hematocrit: 35.3 % — ABNORMAL LOW (ref 37.5–51.0)
Hemoglobin: 11.3 g/dL — ABNORMAL LOW (ref 13.0–17.7)
MCH: 30.1 pg (ref 26.6–33.0)
MCHC: 32 g/dL (ref 31.5–35.7)
MCV: 94 fL (ref 79–97)
Platelets: 299 x10E3/uL (ref 150–450)
RBC: 3.75 x10E6/uL — ABNORMAL LOW (ref 4.14–5.80)
RDW: 18 % — ABNORMAL HIGH (ref 11.6–15.4)
WBC: 9.7 x10E3/uL (ref 3.4–10.8)

## 2024-09-19 LAB — COMPREHENSIVE METABOLIC PANEL WITH GFR
ALT: 23 IU/L (ref 0–44)
AST: 24 IU/L (ref 0–40)
Albumin: 4.1 g/dL (ref 3.8–4.8)
Alkaline Phosphatase: 137 IU/L — ABNORMAL HIGH (ref 47–123)
BUN/Creatinine Ratio: 26 — ABNORMAL HIGH (ref 10–24)
BUN: 31 mg/dL — ABNORMAL HIGH (ref 8–27)
Bilirubin Total: 0.3 mg/dL (ref 0.0–1.2)
CO2: 22 mmol/L (ref 20–29)
Calcium: 9.8 mg/dL (ref 8.6–10.2)
Chloride: 100 mmol/L (ref 96–106)
Creatinine, Ser: 1.18 mg/dL (ref 0.76–1.27)
Globulin, Total: 2.7 g/dL (ref 1.5–4.5)
Glucose: 99 mg/dL (ref 70–99)
Potassium: 4.4 mmol/L (ref 3.5–5.2)
Sodium: 140 mmol/L (ref 134–144)
Total Protein: 6.8 g/dL (ref 6.0–8.5)
eGFR: 65 mL/min/1.73

## 2024-09-19 MED ORDER — NITROGLYCERIN 0.4 MG SL SUBL: 0.4000 mg | SUBLINGUAL_TABLET | SUBLINGUAL | 3 refills | Status: AC | PRN

## 2024-09-19 MED ORDER — PREDNISONE 10 MG PO TABS: ORAL_TABLET | ORAL | 0 refills | Status: AC

## 2024-09-19 MED ORDER — DOXYCYCLINE MONOHYDRATE 25 MG/5ML PO SUSR: 100.0000 mg | Freq: Two times a day (BID) | ORAL | 0 refills | Status: DC

## 2024-09-19 NOTE — Telephone Encounter (Signed)
 Sending doxy and prednisone taper in for dyspnea.  JD

## 2024-09-19 NOTE — Telephone Encounter (Signed)
 I called and spoke with the pt's spouse ok per DPR and notified of response from Dr Kara. She verbalized understanding. Nothing further needed.

## 2024-09-19 NOTE — Telephone Encounter (Signed)
 Pt scheduled with PCP 10/02/24. Pt's wife would like to know if Dr. Kara has any recommendations for the pt in the meantime. Pt scheduled for Chest CT 10/18/24 but pt's wife will attempt to call and see if there is anything sooner.   [facility] Pulmonary Triage - Initial Assessment Questions Chief Complaint (e.g., cough, sob, wheezing, fever, chills, sweat or additional symptoms) *Go to specific symptom protocol after initial questions. Breathing   How long have symptoms been present?  3 weeks  Have you tested for COVID or Flu? Note: If not, ask patient if a home test can be taken. If so, instruct patient to call back for positive results. No  MEDICINES:   Have you used any OTC meds to help with symptoms? No If yes, ask What medications? N/a  Have you used your inhalers/maintenance medication? Yes If yes, What medications? Albuterol   If inhaler, ask How many puffs and how often? Note: Review instructions on medication in the chart. no  OXYGEN: Do you wear supplemental oxygen? No If yes, How many liters are you supposed to use? N/a  Do you monitor your oxygen levels? Yes If yes, What is your reading (oxygen level) today? 97%  What is your usual oxygen saturation reading?  (Note: Pulmonary O2 sats should be 90% or greater) 97%  Triage Disposition: See PCP Within 2 Weeks  Patient/caregiver understands and will follow disposition?: Yes  Reason for Disposition  [1] MILD longstanding difficulty breathing (e.g., minimal/no SOB at rest, SOB with walking, pulse < 100) AND [2] SAME as normal  Answer Assessment - Initial Assessment Questions Pt's wife calling in to report pt is SOB on exertion/movement, dizzy when standing, fatigue, SpO2 97% today 09/19/24.  Pt scheduled with PCP 10/02/24. Pt's wife would like to know if Dr. Kara has any recommendations for the pt in the meantime. Pt scheduled for Chest CT 10/18/24 but pt's wife will attempt to call and see if  there is anything sooner.   1. RESPIRATORY STATUS: Describe your breathing? (e.g., wheezing, shortness of breath, unable to speak, severe coughing)      SOB with exertion/movement 2. ONSET: When did this breathing problem begin?      3 weeks ago but ongoing and getting worse 3. PATTERN Does the difficult breathing come and go, or has it been constant since it started?      Comes and goes - only with exertion  4. SEVERITY: How bad is your breathing? (e.g., mild, moderate, severe)      Mild  5. RECURRENT SYMPTOM: Have you had difficulty breathing before? If Yes, ask: When was the last time? and What happened that time?      yes 6. CARDIAC HISTORY: Do you have any history of heart disease? (e.g., heart attack, angina, bypass surgery, angioplasty)      Yes   7. LUNG HISTORY: Do you have any history of lung disease?  (e.g., pulmonary embolus, asthma, emphysema)     Yes  8. CAUSE: What do you think is causing the breathing problem?      N/a  9. OTHER SYMPTOMS: Do you have any other symptoms? (e.g., chest pain, cough, dizziness, fever, runny nose)     Dizziness when getting up  10. O2 SATURATION MONITOR:  Do you use an oxygen saturation monitor (pulse oximeter) at home? If Yes, ask: What is your reading (oxygen level) today? What is your usual oxygen saturation reading? (e.g., 95%)       97%  Protocols used: Breathing Difficulty-A-AH  Copied from CRM [id number]. Topic: Clinical - Red Word Triage >> Sep 19, 2024  2:37 PM Isabell A wrote: Red Word that prompted transfer to Nurse Triage: Spouse states patient is experiencing SOB & dizziness.

## 2024-09-19 NOTE — Telephone Encounter (Signed)
 Steroid script per other phone note.

## 2024-09-19 NOTE — Patient Instructions (Addendum)
 Medication Instructions:  Your physician recommends that you continue on your current medications as directed. Please refer to the Current Medication list given to you today.  *If you need a refill on your cardiac medications before your next appointment, please call your pharmacy*  Lab Work: Today: CMET, CBC If you have labs (blood work) drawn today and your tests are completely normal, you will receive your results only by: MyChart Message (if you have MyChart) OR A paper copy in the mail If you have any lab test that is abnormal or we need to change your treatment, we will call you to review the results.  Testing/Procedures: None   Follow-Up: At [facility], you and your health needs are our priority.  As part of our continuing mission to provide you with exceptional heart care, our providers are all part of one team.  This team includes your primary Cardiologist (physician) and Advanced Practice Providers or APPs (Physician Assistants and Nurse Practitioners) who all work together to provide you with the care you need, when you need it.  Your next appointment:   August 2026  Provider:   Redell Shallow, MD    Other Instructions None

## 2024-09-20 ENCOUNTER — Other Ambulatory Visit: Payer: Self-pay

## 2024-09-20 ENCOUNTER — Telehealth: Payer: Self-pay

## 2024-09-20 ENCOUNTER — Ambulatory Visit: Payer: Self-pay | Admitting: Cardiology

## 2024-09-20 ENCOUNTER — Ambulatory Visit

## 2024-09-20 DIAGNOSIS — E785 Hyperlipidemia, unspecified: Secondary | ICD-10-CM

## 2024-09-20 DIAGNOSIS — J4541 Moderate persistent asthma with (acute) exacerbation: Secondary | ICD-10-CM

## 2024-09-20 DIAGNOSIS — Z79899 Other long term (current) drug therapy: Secondary | ICD-10-CM

## 2024-09-20 MED ORDER — DOXYCYCLINE MONOHYDRATE 25 MG/5ML PO SUSR: 100.0000 mg | Freq: Two times a day (BID) | ORAL | 0 refills | Status: DC

## 2024-09-20 NOTE — Telephone Encounter (Signed)
 See below

## 2024-09-20 NOTE — Telephone Encounter (Signed)
 Spoke with patient wife okay per DPR -VBU, resent order to pharmacy as well as spoke to pharmacy they will order and be in by Friday since they are closed for new years day      Copied from CRM #[id number]. Topic: Clinical - Prescription Issue >> Sep 20, 2024 10:05 AM Leila BROCKS wrote: Reason for CRM: Patient's wife Heron [id number] [address] [facility] [id number] [place], [place] - [address] [place] [address] Phone: [phone] Fax: [phone] received Prednisone 10mg , but does not have doxycycline (VIBRAMYCIN) 25 MG/5ML SUSR from Dr. Kara. Please advise and call back.

## 2024-09-22 NOTE — Telephone Encounter (Signed)
 Copied from CRM #[id number]. Topic: Clinical - Prescription Issue >> Sep 20, 2024 11:36 AM Isabell A wrote: Reason for CRM:  From previous encounter:  Ilah Krabbe   09/20/2024 10:32 AM Patients wife states they needs Dr. Kara to order the tabs of the doxycycline as that is what the pharmacy has in stock. Please call patient back when this is completed.   Wife is requesting to speak to nurse in the office.  Callback number: [phone]    ATC X1. Left message (DPR) stating doxy rx was resent 12/31 and to followup with pharmacy regarding pickup. Also advised per Burnard note rx would be in today due to the holidays.

## 2024-09-26 ENCOUNTER — Ambulatory Visit: Attending: Radiation Oncology

## 2024-09-26 ENCOUNTER — Telehealth: Payer: Self-pay | Admitting: Dietician

## 2024-09-26 ENCOUNTER — Inpatient Hospital Stay: Attending: Oncology | Admitting: Dietician

## 2024-09-26 DIAGNOSIS — C109 Malignant neoplasm of oropharynx, unspecified: Secondary | ICD-10-CM | POA: Insufficient documentation

## 2024-09-26 DIAGNOSIS — M6281 Muscle weakness (generalized): Secondary | ICD-10-CM | POA: Insufficient documentation

## 2024-09-26 DIAGNOSIS — R293 Abnormal posture: Secondary | ICD-10-CM | POA: Diagnosis present

## 2024-09-26 DIAGNOSIS — I890 Lymphedema, not elsewhere classified: Secondary | ICD-10-CM | POA: Insufficient documentation

## 2024-09-26 NOTE — Therapy (Signed)
 " OUTPATIENT PHYSICAL THERAPY HEAD AND NECK POST RADIATION TREATMENT   Patient Name: Gilbert Thompson MRN: [id number] DOB:1951/07/20, 74 y.o., male Today's Date: 09/26/2024  END OF SESSION:  PT End of Session - 09/26/24 1216     Visit Number 3    Number of Visits 10    Date for Recertification  10/16/24    PT Start Time 1205    PT Stop Time 1258    PT Time Calculation (min) 53 min    Activity Tolerance Patient tolerated treatment well    Behavior During Therapy WFL for tasks assessed/performed          Past Medical History:  Diagnosis Date   Asthma    Cancer (HCC)    prostate cancer   Coronary artery disease    pt states they were unable to place a stent at that time   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, follwed by [facility]   Dyspnea    with exertion   GERD (gastroesophageal reflux disease)    Hyperlipidemia    Hypertension    Pneumonia    Past Surgical History:  Procedure Laterality Date   CATARACT EXTRACTION Bilateral    IR GASTROSTOMY TUBE MOD SED  06/23/2024   IR IMAGING GUIDED PORT INSERTION  06/23/2024   LEFT HEART CATH AND CORONARY ANGIOGRAPHY N/A 06/05/2022   Procedure: LEFT HEART CATH AND CORONARY ANGIOGRAPHY;  Surgeon: Claudene Victory ORN, MD;  Location: [facility];  Service: Cardiovascular;  Laterality: N/A;   LYMPHADENECTOMY Bilateral 04/29/2023   Procedure: BILATERAL PELVIC LYMPHADENECTOMY;  Surgeon: Renda Glance, MD;  Location: [facility];  Service: Urology;  Laterality: Bilateral;   PROSTATE BIOPSY     ROBOT ASSISTED LAPAROSCOPIC RADICAL PROSTATECTOMY N/A 04/29/2023   Procedure: XI ROBOTIC ASSISTED LAPAROSCOPIC RADICAL PROSTATECTOMY LEVEL 2;  Surgeon: Renda Glance, MD;  Location: [facility];  Service: Urology;  Laterality: N/A;  210 MINUTES NEEDED FOR CASE   UPPER GASTROINTESTINAL ENDOSCOPY     VENTRAL HERNIA REPAIR N/A 10/05/2023   Procedure: LAPAROSCOPIC VENTRAL HERNIA REPAAIR WITH MESH;  Surgeon: Rubin Calamity, MD;  Location: [facility] OR;  Service:  General;  Laterality: N/A;   Patient Active Problem List   Diagnosis Date Noted   Malnutrition of moderate degree 08/16/2024   Diarrhea 08/15/2024   Antineoplastic chemotherapy induced pancytopenia 08/15/2024   HCAP (healthcare-associated pneumonia) 08/14/2024   Sepsis (HCC) 08/11/2024   Protein-calorie malnutrition, severe 08/09/2024   Sepsis due to undetermined organism (HCC) 08/07/2024   Neutropenia with fever 08/07/2024   Anemia 08/07/2024   Hyponatremia 08/07/2024   Moderate protein malnutrition 08/07/2024   Transaminitis 08/07/2024   Oral thrush 08/07/2024   Multifocal pneumonia 08/07/2024   Sinus tachycardia 08/07/2024   Hypomagnesemia 08/07/2024   Hypophosphatemia 08/07/2024   Neutropenia 08/07/2024   Leukopenia due to antineoplastic chemotherapy 07/26/2024   Cancer related pain 06/08/2024   Oropharyngeal cancer (HCC) 06/07/2024   Neck mass 04/23/2024   Genetic testing 04/23/2023   Malignant neoplasm of prostate (HCC) 02/24/2023   Dysphonia 11/25/2022   Coronary artery disease involving native coronary artery of native heart without angina pectoris    DOE (dyspnea on exertion)     PCP: Maude Sprague, MD   REFERRING PROVIDER: Lauraine Golden, MD   REFERRING DIAG: C10.9 (ICD-10-CM) - Primary squamous cell carcinoma of oropharynx (HCC)   THERAPY DIAG:  Lymphedema, not elsewhere classified  Muscle weakness (generalized)  Abnormal posture  Primary squamous cell carcinoma of oropharynx (HCC)  Rationale for  Evaluation and Treatment: Rehabilitation  ONSET DATE: 05/15/24  SUBJECTIVE:                                                                                                                                                                                           SUBJECTIVE STATEMENT: I've been wearing the chip pack but it doesn't stay well on the top of my head. I want to look at getting a garment.   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 He presented to his PCP with complaints of chronic pharyngitis and a left submandibular mass that has persisted over the past year. CT scans were ordered. 04/06/24 CT neck/chest showed a 3.0 x 2.4 x 2.5 cm soft tissue mass centered in the left oropharynx at the glossotonsillar sulcus with possible involvement of the adjacent tongue base. Scans also noted scattered tiny pulmonary nodules are unchanged and likely benign but with only 5 months of documented stability but with recommend attention on follow-up given his history of prostate cancer. 04/24/24 He saw Dr. Graig who performed a FNA biopsy of left neck. However, biopsy was inconclusive. As a result, he then underwent a laryngoscopy with biopsy on 05/15/24 under the care of Dr. Lauralee. 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 Tumor is moderately differentiated with foci of poor differentiation and invasion into the striated muscle. Immunohistochemical studies are p16, p40, and HRHPV are positive.  05/09/24 MRI cervical spine revealing no evidence of metastatic disease. 04/26/24 PET showing FDG avid mass along the left tongue base/oropharynx with left cervical chain nodal metastatic disease with no suspicious pulmonary or mediastinal FDG uptake. He will receive 35 fractions of radiation to his Oropharynx and bilateral neck with weekly chemotherapy. He will start on 06/27/24 and complete 08/14/24. 06/23/24. PEG/PAC. History of prostate cancer, no radiation had surgery. Pt reports 80% blockage of LAD. Hospitalized for pneumonia in Nov 2025.   PATIENT GOALS:  Reassess how my recovery is going related to neck ROM, cervical pain, fatigue, and swelling.  PAIN:  Are you having pain? Yes NPRS scale: 6/10 Pain location: throat Pain orientation: Left  PAIN TYPE: raw Pain description: constant  Aggravating factors: eating, brushing teeth Relieving factors: nothing  PRECAUTIONS:  Recent radiation, Head and neck lymphedema risk,    OBJECTIVE:   POSTURE:  Forward head and rounded shoulders posture  30 SEC SIT TO STAND: 09/18/24: 16 reps in 30 sec without use UEs which is good for patients age  Baseline 06/22/24: 13 reps in 30 sec without use of UEs which is  Average for patient's age.   SHOULDER AROM:   At baseline pt reported L shoulder was uncomfortable to sleep on but both shoulders were [facility]  CERVICAL AROM:     Percent limited 09/18/24  Flexion WFL WFL  Extension 75% limited WFL  Right lateral flexion 75% limited 75% limited  Left lateral flexion 75% limited 85% limited  Right rotation 50% limited 50% limited  Left rotation 50% limited 50% limited                          (Blank rows=not tested)  LYMPHEDEMA ASSESSMENT:    Circumference in cm  4 cm superior to sternal notch around neck 39.2  6 cm superior to sternal notch around neck 39.5  8 cm superior to sternal notch around neck 41.2  R lateral nostril from base of nose to medial tragus   L lateral nostril from base of nose to medial tragus   R corner of mouth to where ear lobe meets face   L corner of mouth to where ear lobe meets face         (Blank rows=not tested)  LE STRENGTH: Bilateral hips 4/5, bilateral knee extension 5/5, ankle DF 5/5  Neck Disability Index score: 7 / 50 = 14.0 %  BALANCE: SLS on R - 7 sec, on L - 20 sec  OTHER SYMPTOMS: Pain Yes Fibrosis Yes Pitting edema No Infections No Decreased scar mobility No  TREATMENT PERFORMED:  09/26/24: Self Care Initially educated pt and spouse in basic anatomy of lymphatic system and sequence of MLD answering their questions during. Also answered their questions about H&N compression garment options and showed them [employer] professor [employer]. They want to try the [facility] garment so will submit this through [employer] to see if insurance will cover any of price. Otherwise, they said they are able to pay OOP for this  one if not.  Manual Therapy MLD to H&N using Norton anterior approach and handout. Excluded step 7 as this wasn't necessary due to anatomy of pt. Therapist performed most of sequence having wife and pt return steps briefly to begin instruction of light pressure and correct skin stretch, not slide.  09/18/24: Created foam chip pack for pt to wear for lymphedema, educated pt in basic skin stretch technique for anterior neck    PATIENT EDUCATION:  Education details: Self MLD using Norton anterior approach Person educated: Patient and Spouse Education method: Medical Illustrator, actor and VC's, handout issued Education comprehension: verbalized understanding, returned demo, tactile cues and will benefit from further review  HOME EXERCISE PROGRAM: Reviewed previously given post op HEP. Self MLD  ASSESSMENT:  CLINICAL IMPRESSION: Pt reports he has been wearing his chip pack issued to him at last session. However, he has been fighting to get it to stay in place on his head so wanted to purchase a H&N compression garment. After showing him and wife [employer] [employer] options, they decided on [employer] swell spot so submitted order through [employer] to see if insurance will cover any first. Then began instruction of self MLD to H&N area using Norton anterior approach. Pt and especially wife able to verbalize good understanding of sequence by end of session and improvement in technique. They will both benefit from further review for reinforcement of correct technique and follow through of sequence.   Pt will benefit from skilled therapeutic intervention to improve on the following deficits: decreased knowledge of condition, decreased knowledge of  use of DME, decreased ROM, decreased strength, increased edema, increased fascial restrictions, postural dysfunction, and pain  PT treatment/interventions: ADL/Self care home management, [id number]- PT Re-evaluation, 97110-Therapeutic exercises, 97530- Therapeutic  activity, [id number]- Neuromuscular re-education, 97535- Self Care, [id number]- Manual therapy, 97760- Orthotic Initial, [phone]- Orthotic/Prosthetic subsequent, and Patient/Family education     GOALS: Goals reviewed with patient? Yes  GOALS MET AT EVAL:   Name Target Date  Goal status  1 Patient will be able to verbalize understanding of a home exercise program for cervical range of motion, posture, and walking.   Baseline:  No knowledge Eval Achieved at eval  2 Patient will be able to verbalize understanding of proper sitting and standing posture. Baseline:  No knowledge Eval Achieved at eval  3 Patient will be able to verbalize understanding of lymphedema risk and availability of treatment for this condition Baseline:  No knowledge Eval Achieved at eval     LONG TERM GOALS:  (STG=LTG)  GOALS Name Target Date  Goal status  1 Pt will demonstrate a return to baseline cervical ROM measurements and not demonstrate any signs or symptoms of lymphedema. Baseline: 10/16/24 NEW  2 Pt will be independent in self MLD for long term management of neck lymphedema. 10/16/24 NEW  3 Pt will be independent in a home exercise program for stretching and strengthening and for improved posture. 10/16/24 NEW  4 Pt will demonstrate 5/5 bilateral hip flexion strength to decrease fall risk. 10/16/24  NEW       PLAN:  PT FREQUENCY/DURATION: 2x/wk for 4 wks  PLAN FOR NEXT SESSION: Review MLD in front of mirror next with therapist standing behind pt and performing with pt for reinforcement, wife may want to videorecord; Cont with self MLD for anterior neck lymphedema reviewing with pt and wife, find anything out about the garment? posture exercises, neck ROM, eventually LE strengthening/balance pending clearance from cardiologist 09/26/24: Val placed order for [facility] swell spot through [facility]  [address]  [place] [address]  [phone]    Aden Berwyn Caldron, PTA 09/26/2024, 1:22 PM  "

## 2024-09-26 NOTE — Telephone Encounter (Signed)
 Nutrition Follow-up:  Pt with primary SCC of oropharynx, p16+.  He completed concurrent chemoradiation with weekly cisplatin  (final RT 11/24). Patient is under the care of Dr. Izell and Dr. Autumn.    S/p PEG - 10/3   11/17-11/22  admission with sepsis of undetermined organism 11/24 - 11/26 admission with HCAP  Spoke with wife via telephone for nutrition follow-up. Patient continues to have challenges with increasing oral intake. Nothing taste good. Dry mouth makes foods stick to roof of mouth and unable to swallow. Patient has an appointment with his dentist on Friday. He is taking pills with jello, drinking bone broth and likes ice cream. Wife reports he was able to eat eggs, but has not done this in a couple weeks. Currently doing 4 Boost. Patient has more energy. He is taking walks and says he would like to loose weight.   Medications: reviewed   Labs: 12/30 labs reviewed   Anthropometrics: Last wt 178 lb 2 oz on 12/23   12/8 - 169 lb 3.2 oz  11/26 - 178 lb 12.7 oz 11/17 - 181 lb 10.5 oz  Estimated Energy Needs  Kcals: 2160-2330 Protein: 99-116 Fluid: >2.1 L  NUTRITION DIAGNOSIS: Predicted sub-optimal intake ongoing - relying on tube    INTERVENTION:  Continue po trials - encourage soft moist textures and playing into flavors that are working  Continue 4 Boost [facility]  Encourage activity as able Recommend wt maintenance as metabolic demands continue to be increased to support post treatment healing Support and encouragement    MONITORING, EVALUATION, GOAL: wt trends, intake   NEXT VISIT: Tuesday January 20 via telephone

## 2024-09-28 ENCOUNTER — Ambulatory Visit

## 2024-09-28 ENCOUNTER — Ambulatory Visit
Admission: RE | Admit: 2024-09-28 | Discharge: 2024-09-28 | Disposition: A | Discharge status: Home / Self Care | Source: Ambulatory Visit | Attending: Pulmonary Disease | Admitting: Pulmonary Disease

## 2024-09-28 DIAGNOSIS — I890 Lymphedema, not elsewhere classified: Secondary | ICD-10-CM | POA: Diagnosis not present

## 2024-09-28 DIAGNOSIS — R293 Abnormal posture: Secondary | ICD-10-CM

## 2024-09-28 DIAGNOSIS — J189 Pneumonia, unspecified organism: Secondary | ICD-10-CM

## 2024-09-28 DIAGNOSIS — M6281 Muscle weakness (generalized): Secondary | ICD-10-CM

## 2024-09-28 DIAGNOSIS — C109 Malignant neoplasm of oropharynx, unspecified: Secondary | ICD-10-CM

## 2024-09-28 NOTE — Therapy (Signed)
 " OUTPATIENT PHYSICAL THERAPY HEAD AND NECK POST RADIATION TREATMENT   Patient Name: Gilbert Thompson MRN: [id number] DOB:[birth year], 74 y.o., male Today's Date: 09/28/2024  END OF SESSION:  PT End of Session - 09/28/24 0906     Visit Number 4    Number of Visits 10    Date for Recertification  10/16/24    PT Start Time 0904    PT Stop Time 0958    PT Time Calculation (min) 54 min    Activity Tolerance Patient tolerated treatment well    Behavior During Therapy [facility] for tasks assessed/performed          Past Medical History:  Diagnosis Date   Asthma    Cancer (HCC)    prostate cancer   Coronary artery disease    pt states they were unable to place a stent at that time   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, follwed by [facility]   Dyspnea    with exertion   GERD (gastroesophageal reflux disease)    Hyperlipidemia    Hypertension    Pneumonia    Past Surgical History:  Procedure Laterality Date   CATARACT EXTRACTION Bilateral    IR GASTROSTOMY TUBE MOD SED  06/23/2024   IR IMAGING GUIDED PORT INSERTION  06/23/2024   LEFT HEART CATH AND CORONARY ANGIOGRAPHY N/A 06/05/2022   Procedure: LEFT HEART CATH AND CORONARY ANGIOGRAPHY;  Surgeon: Claudene Victory ORN, MD;  Location: [facility];  Service: Cardiovascular;  Laterality: N/A;   LYMPHADENECTOMY Bilateral 04/29/2023   Procedure: BILATERAL PELVIC LYMPHADENECTOMY;  Surgeon: Renda Glance, MD;  Location: [facility];  Service: Urology;  Laterality: Bilateral;   PROSTATE BIOPSY     ROBOT ASSISTED LAPAROSCOPIC RADICAL PROSTATECTOMY N/A 04/29/2023   Procedure: XI ROBOTIC ASSISTED LAPAROSCOPIC RADICAL PROSTATECTOMY LEVEL 2;  Surgeon: Renda Glance, MD;  Location: [facility];  Service: Urology;  Laterality: N/A;  210 MINUTES NEEDED FOR CASE   UPPER GASTROINTESTINAL ENDOSCOPY     VENTRAL HERNIA REPAIR N/A 10/05/2023   Procedure: LAPAROSCOPIC VENTRAL HERNIA REPAAIR WITH MESH;  Surgeon: Rubin Calamity, MD;  Location: [facility] OR;  Service:  General;  Laterality: N/A;   Patient Active Problem List   Diagnosis Date Noted   Malnutrition of moderate degree 08/16/2024   Diarrhea 08/15/2024   Antineoplastic chemotherapy induced pancytopenia 08/15/2024   HCAP (healthcare-associated pneumonia) 08/14/2024   Sepsis (HCC) 08/11/2024   Protein-calorie malnutrition, severe 08/09/2024   Sepsis due to undetermined organism (HCC) 08/07/2024   Neutropenia with fever 08/07/2024   Anemia 08/07/2024   Hyponatremia 08/07/2024   Moderate protein malnutrition 08/07/2024   Transaminitis 08/07/2024   Oral thrush 08/07/2024   Multifocal pneumonia 08/07/2024   Sinus tachycardia 08/07/2024   Hypomagnesemia 08/07/2024   Hypophosphatemia 08/07/2024   Neutropenia 08/07/2024   Leukopenia due to antineoplastic chemotherapy 07/26/2024   Cancer related pain 06/08/2024   Oropharyngeal cancer (HCC) 06/07/2024   Neck mass 04/23/2024   Genetic testing 04/23/2023   Malignant neoplasm of prostate (HCC) 02/24/2023   Dysphonia 11/25/2022   Coronary artery disease involving native coronary artery of native heart without angina pectoris    DOE (dyspnea on exertion)     PCP: Maude Sprague, MD   REFERRING PROVIDER: Lauraine Golden, MD   REFERRING DIAG: C10.9 (ICD-10-CM) - Primary squamous cell carcinoma of oropharynx (HCC)   THERAPY DIAG:  Lymphedema, not elsewhere classified  Muscle weakness (generalized)  Abnormal posture  Primary squamous cell carcinoma of oropharynx (HCC)  Rationale for  Evaluation and Treatment: Rehabilitation  ONSET DATE: 05/15/24  SUBJECTIVE:                                                                                                                                                                                           SUBJECTIVE STATEMENT: I've played with wearing the chip pack and feel like I've gotten it to stay a little better now. My wife and I practiced a little with the massage you taught us  last time.    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 He presented to his PCP with complaints of chronic pharyngitis and a left submandibular mass that has persisted over the past year. CT scans were ordered. 04/06/24 CT neck/chest showed a 3.0 x 2.4 x 2.5 cm soft tissue mass centered in the left oropharynx at the glossotonsillar sulcus with possible involvement of the adjacent tongue base. Scans also noted scattered tiny pulmonary nodules are unchanged and likely benign but with only 5 months of documented stability but with recommend attention on follow-up given his history of prostate cancer. 04/24/24 He saw Dr. Graig who performed a FNA biopsy of left neck. However, biopsy was inconclusive. As a result, he then underwent a laryngoscopy with biopsy on 05/15/24 under the care of Dr. Lauralee. 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 Tumor is moderately differentiated with foci of poor differentiation and invasion into the striated muscle. Immunohistochemical studies are p16, p40, and HRHPV are positive.  05/09/24 MRI cervical spine revealing no evidence of metastatic disease. 04/26/24 PET showing FDG avid mass along the left tongue base/oropharynx with left cervical chain nodal metastatic disease with no suspicious pulmonary or mediastinal FDG uptake. He will receive 35 fractions of radiation to his Oropharynx and bilateral neck with weekly chemotherapy. He will start on 06/27/24 and complete 08/14/24. 06/23/24. PEG/PAC. History of prostate cancer, no radiation had surgery. Pt reports 80% blockage of LAD. Hospitalized for pneumonia in Nov 2025.   PATIENT GOALS:  Reassess how my recovery is going related to neck ROM, cervical pain, fatigue, and swelling.  PAIN:  Are you having pain? Yes NPRS scale: 6/10 Pain location: throat Pain orientation: Left  PAIN TYPE: raw Pain description: constant  Aggravating factors:  eating, brushing teeth Relieving factors: nothing  PRECAUTIONS: Recent radiation, Head and neck lymphedema risk,    OBJECTIVE:   POSTURE:  Forward head and rounded shoulders posture  30 SEC SIT TO STAND: 09/18/24: 16 reps in 30 sec without use UEs which is good for patients age  Baseline 06/22/24: 13 reps  in 30 sec without use of UEs which is  Average for patient's age.   SHOULDER AROM:   At baseline pt reported L shoulder was uncomfortable to sleep on but both shoulders were [facility]  CERVICAL AROM:     Percent limited 09/18/24  Flexion WFL WFL  Extension 75% limited WFL  Right lateral flexion 75% limited 75% limited  Left lateral flexion 75% limited 85% limited  Right rotation 50% limited 50% limited  Left rotation 50% limited 50% limited                          (Blank rows=not tested)  LYMPHEDEMA ASSESSMENT:    Circumference in cm  4 cm superior to sternal notch around neck 39.2  6 cm superior to sternal notch around neck 39.5  8 cm superior to sternal notch around neck 41.2  R lateral nostril from base of nose to medial tragus   L lateral nostril from base of nose to medial tragus   R corner of mouth to where ear lobe meets face   L corner of mouth to where ear lobe meets face         (Blank rows=not tested)  LE STRENGTH: Bilateral hips 4/5, bilateral knee extension 5/5, ankle DF 5/5  Neck Disability Index score: 7 / 50 = 14.0 %  BALANCE: SLS on R - 7 sec, on L - 20 sec  OTHER SYMPTOMS: Pain Yes Fibrosis Yes Pitting edema No Infections No Decreased scar mobility No  TREATMENT PERFORMED: 09/28/24: Self Care At beginning of session reviewed self MLD with having pt wit in chair in front of mirror and PTA stood behind pt to perform whole Norton anterior approach sequence with pt returning steps with therapist throughout. Pts wife, with his permission, also video recorded for further reinforcement at home. Tactile and VC's were offered during for further education  and correction of technique. Manual Therapy After instruction then PTA performed whole sequence again with pt supine per handout and continued to have pt return some steps for continued instruction.   09/26/24: Self Care Initially educated pt and spouse in basic anatomy of lymphatic system and sequence of MLD answering their questions during. Also answered their questions about H&N compression garment options and showed them [employer] professor [employer]. They want to try the [facility] garment so will submit this through [employer] to see if insurance will cover any of price. Otherwise, they said they are able to pay OOP for this one if not.  Manual Therapy MLD to H&N using Norton anterior approach and handout. Excluded step 7 as this wasn't necessary due to anatomy of pt. Therapist performed most of sequence having wife and pt return steps briefly to begin instruction of light pressure and correct skin stretch, not slide.  09/18/24: Created foam chip pack for pt to wear for lymphedema, educated pt in basic skin stretch technique for anterior neck    PATIENT EDUCATION:  Education details: Self MLD using Norton anterior approach Person educated: Patient and Spouse Education method: Medical Illustrator, actor and VC's, handout issued Education comprehension: verbalized understanding, returned demo, tactile cues and will benefit from further review  HOME EXERCISE PROGRAM: Reviewed previously given post op HEP. Self MLD  ASSESSMENT:  CLINICAL IMPRESSION: Valentine is on order through [employer]. Pt is wearing his chip pack at home. Continued with review and instruction of self MLD for H&N area offering cuing prn in sitting and again supine. Pt  is able to show some improved technique but does struggle with pressure due to not fully relaxing fingers.   Pt will benefit from skilled therapeutic intervention to improve on the following deficits: decreased knowledge of condition,  decreased knowledge of use of DME, decreased ROM, decreased strength, increased edema, increased fascial restrictions, postural dysfunction, and pain  PT treatment/interventions: ADL/Self care home management, [id number]- PT Re-evaluation, 97110-Therapeutic exercises, 97530- Therapeutic activity, [id number]- Neuromuscular re-education, 97535- Self Care, [id number]- Manual therapy, [id number]- Orthotic Initial, [id number]- Orthotic/Prosthetic subsequent, and Patient/Family education     GOALS: Goals reviewed with patient? Yes  GOALS MET AT EVAL:   Name Target Date  Goal status  1 Patient will be able to verbalize understanding of a home exercise program for cervical range of motion, posture, and walking.   Baseline:  No knowledge Eval Achieved at eval  2 Patient will be able to verbalize understanding of proper sitting and standing posture. Baseline:  No knowledge Eval Achieved at eval  3 Patient will be able to verbalize understanding of lymphedema risk and availability of treatment for this condition Baseline:  No knowledge Eval Achieved at eval     LONG TERM GOALS:  (STG=LTG)  GOALS Name Target Date  Goal status  1 Pt will demonstrate a return to baseline cervical ROM measurements and not demonstrate any signs or symptoms of lymphedema. Baseline: 10/16/24 NEW  2 Pt will be independent in self MLD for long term management of neck lymphedema. 10/16/24 NEW  3 Pt will be independent in a home exercise program for stretching and strengthening and for improved posture. 10/16/24 NEW  4 Pt will demonstrate 5/5 bilateral hip flexion strength to decrease fall risk. 10/16/24  NEW       PLAN:  PT FREQUENCY/DURATION: 2x/wk for 4 wks  PLAN FOR NEXT SESSION: Review MLD in front of mirror PRN for reinforcement; Cont with MLD for anterior neck lymphedema reviewing with pt and wife PRN, find anything out about the garment? Next if all questions answered from self MLD start posture exercises and begin a HEP, neck ROM,  eventually LE strengthening/balance pending clearance from cardiologist 09/26/24: Wendelyn placed order for [facility] swell spot through [facility]  [address], [address]  [place] [address]  [phone]    Aden Berwyn Caldron, PTA 09/28/2024, 10:15 AM  "

## 2024-10-02 ENCOUNTER — Ambulatory Visit: Admitting: Pulmonary Disease

## 2024-10-02 ENCOUNTER — Encounter: Payer: Self-pay | Admitting: Pulmonary Disease

## 2024-10-02 VITALS — BP 116/75 | HR 117 | Ht 69.0 in | Wt 171.4 lb

## 2024-10-02 DIAGNOSIS — C109 Malignant neoplasm of oropharynx, unspecified: Secondary | ICD-10-CM

## 2024-10-02 DIAGNOSIS — J189 Pneumonia, unspecified organism: Secondary | ICD-10-CM

## 2024-10-02 DIAGNOSIS — J4540 Moderate persistent asthma, uncomplicated: Secondary | ICD-10-CM | POA: Diagnosis not present

## 2024-10-02 MED ORDER — AMOXICILLIN-POT CLAVULANATE 875-125 MG PO TABS: 1.0000 | ORAL_TABLET | Freq: Two times a day (BID) | ORAL | 0 refills | Status: AC

## 2024-10-02 MED ORDER — PREDNISONE 10 MG PO TABS: 20.0000 mg | ORAL_TABLET | Freq: Every day | ORAL | 0 refills | Status: AC

## 2024-10-02 NOTE — Assessment & Plan Note (Signed)
 SABRA

## 2024-10-02 NOTE — Progress Notes (Unsigned)
 "  Established Patient Pulmonology Office Visit   Subjective:  Patient ID: Gilbert Thompson, male    DOB: 1951/04/13  MRN: [id number]  CC:  Chief Complaint  Patient presents with   Medical Management of Chronic Issues    ACUTE- pt states SOB , doxy full couse, pred stopped dried his mouth out - review CT     Discussed the use of AI scribe software for clinical note transcription with the patient, who gave verbal consent to proceed.  History of Present Illness Gilbert Thompson is a 74 year old male, never smoker with history of asthma and head and neck cancer who returns to pulmonary clinic for recurrent pneumonia   He has exertional shortness of breath that has improved on current medication. He can walk around the block but is winded afterward. He denies wheezing, productive cough, sinus drainage, fever, chills, or night sweats. Sputum, when present, is scant and white.  He recently completed doxycycline  and a prednisone  taper for pneumonia. Prednisone  was adjusted due to intermittent dry mouth. He uses a biotene rinse for symptom relief.  He was previously hospitalized for multifocal pneumonia with sepsis. He has been increasing his activity with gradual improvement in endurance.  He uses a Wixela inhaler after switching from Trelegy due to dry mouth and has albuterol  as needed. He feels the home nebulizer is less effective than the hospital version. Trelegy helped in the past and he still has some at home.  He is planning a trip to [place], [place], for his 74th birthday.        ROS   Current Medications[1]      Objective:  BP 116/75   Pulse (!) 117   Ht 5' 9 (1.753 m) Comment: per pt  Wt 171 lb 6.4 oz (77.7 kg)   SpO2 98%   BMI 25.31 kg/m     Physical Exam Constitutional:      General: He is not in acute distress.    Appearance: Normal appearance.  Eyes:     General: No scleral icterus.    Conjunctiva/sclera: Conjunctivae normal.  Cardiovascular:     Rate and  Rhythm: Normal rate and regular rhythm.  Pulmonary:     Breath sounds: No wheezing, rhonchi or rales.  Musculoskeletal:     Right lower leg: No edema.     Left lower leg: No edema.  Skin:    General: Skin is warm and dry.  Neurological:     General: No focal deficit present.      Diagnostic Review:  Last CBC Lab Results  Component Value Date   WBC 9.7 09/19/2024   HGB 11.3 (L) 09/19/2024   HCT 35.3 (L) 09/19/2024   MCV 94 09/19/2024   MCH 30.1 09/19/2024   RDW 18.0 (H) 09/19/2024   PLT 299 09/19/2024   Last metabolic panel Lab Results  Component Value Date   GLUCOSE 99 09/19/2024   NA 140 09/19/2024   K 4.4 09/19/2024   CL 100 09/19/2024   CO2 22 09/19/2024   BUN 31 (H) 09/19/2024   CREATININE 1.18 09/19/2024   EGFR 65 09/19/2024   CALCIUM  9.8 09/19/2024   PHOS 3.4 08/16/2024   PROT 6.8 09/19/2024   ALBUMIN 4.1 09/19/2024   LABGLOB 2.7 09/19/2024   BILITOT 0.3 09/19/2024   ALKPHOS 137 (H) 09/19/2024   AST 24 09/19/2024   ALT 23 09/19/2024   ANIONGAP 11 08/30/2024   CT Chest 09/28/24 New left upper lobe ground glass consolidations. Resolving right  lung opacities with potential scarring. Pending final radiology read.     Assessment & Plan:   Assessment & Plan Moderate persistent asthma without complication     Recurrent pneumonia  Orders:   amoxicillin -clavulanate (AUGMENTIN ) 875-125 MG tablet; Take 1 tablet by mouth 2 (two) times daily.   predniSONE  (DELTASONE ) 10 MG tablet; Take 2 tablets (20 mg total) by mouth daily with breakfast.  Oropharyngeal cancer (HCC)      Assessment and Plan Assessment & Plan Recurrent pneumonia New left upper lung inflammation likely from aspiration or community-acquired pneumonia. Right lung inflammation improved. Shortness of breath due to deconditioning and residual inflammation. Increased activity level noted. - Prescribed Augmentin  for potential pneumonia during travel. - Prescribed prednisone  with tapering dose  for potential exacerbation during travel. - Advised to monitor for pneumonia symptoms and contact clinic if symptoms arise. - Encouraged continued physical activity to improve conditioning.  Moderate persistent asthma Asthma managed with inhalers. Trelegy preferred despite dry mouth. No current wheezing or coughing. - Restart Trelegy inhaler once daily. - Continue albuterol  inhaler as needed. - Monitor for increased dry mouth with Trelegy use.      Return in about 3 months (around 12/31/2024) for f/u visit Dr. Kara.   Dorn KATHEE Kara, MD     [1]  Current Outpatient Medications:    acetaminophen  (TYLENOL ) 325 MG tablet, Take 2 tablets (650 mg total) by mouth every 6 (six) hours as needed for mild pain (pain score 1-3) or fever (or Fever >/= 101)., Disp: , Rfl:    albuterol  (VENTOLIN  HFA) 108 (90 Base) MCG/ACT inhaler, Inhale 1-2 puffs into the lungs 3 (three) times daily for 5 days, THEN 1-2 puffs every 4 (four) hours as needed for wheezing or shortness of breath., Disp: 6.7 g, Rfl: 0   allopurinol  (ZYLOPRIM ) 300 MG tablet, Place 300 mg into feeding tube daily., Disp: , Rfl:    ALPRAZolam  (XANAX ) 1 MG tablet, Place 1 mg into feeding tube daily as needed for anxiety or sleep., Disp: , Rfl:    amoxicillin -clavulanate (AUGMENTIN ) 875-125 MG tablet, Take 1 tablet by mouth 2 (two) times daily., Disp: 14 tablet, Rfl: 0   antiseptic oral rinse (CORINZ) solution, 7 mLs by Mouth Rinse route as needed (for a dry mouth)., Disp: , Rfl:    [Paused] aspirin  EC 81 MG tablet, Take 81 mg by mouth daily. Swallow whole., Disp: , Rfl:    celecoxib (CELEBREX) 200 MG capsule, Take 200 mg by mouth daily., Disp: , Rfl:    [Paused] ezetimibe  (ZETIA ) 10 MG tablet, TAKE 1 TABLET BY MOUTH DAILY, Disp: 90 tablet, Rfl: 3   finasteride  (PROSCAR ) 5 MG tablet, 5 mg See admin instructions. 5 mg per tube once a day, Disp: , Rfl:    fluticasone  (FLONASE ) 50 MCG/ACT nasal spray, Place 2 sprays into both nostrils daily  for 7 days., Disp: 11.1 mL, Rfl: 0   Fluticasone -Umeclidin-Vilant (TRELEGY ELLIPTA) 100-62.5-25 MCG/ACT AEPB, Inhale 1 puff into the lungs daily., Disp: , Rfl:    HYDROmorphone  (DILAUDID ) 2 MG tablet, Take 1 tablet (2 mg total) by mouth every 6 (six) hours as needed for severe pain (pain score 7-10)., Disp: 90 tablet, Rfl: 0   lidocaine  (XYLOCAINE ) 2 % solution, Patient: Mix 1part 2% viscous lidocaine , 1part H20. Swish & swallow 10mL of diluted mixture, before meals and at bedtime, up to QID, Disp: 200 mL, Rfl: 3   loperamide  HCl (IMODIUM ) 1 MG/7.5ML suspension, Place 15 mLs (2 mg total) into feeding tube as  needed for diarrhea or loose stools., Disp: 120 mL, Rfl: 0   losartan  (COZAAR ) 50 MG tablet, Take 50 mg by mouth daily., Disp: , Rfl:    magic mouthwash (nystatin , lidocaine , diphenhydrAMINE , alum & mag hydroxide) suspension, Swish and swallow 5 mLs by mouth 4 (four) times daily as needed for mouth pain., Disp: 140 mL, Rfl: 2   morphine  (MS CONTIN ) 30 MG 12 hr tablet, Take 1 tablet (30 mg total) by mouth every 12 (twelve) hours., Disp: 60 tablet, Rfl: 0   nitroGLYCERIN  (NITROSTAT ) 0.4 MG SL tablet, Place 1 tablet (0.4 mg total) under the tongue every 5 (five) minutes as needed for chest pain., Disp: 25 tablet, Rfl: 3   ondansetron  (ZOFRAN ) 8 MG tablet, Take 1 tablet (8 mg total) by mouth every 8 (eight) hours as needed for nausea or vomiting. Start on the third day after cisplatin ., Disp: 30 tablet, Rfl: 1   pantoprazole  (PROTONIX ) 40 MG tablet, Take 1 tablet (40 mg total) by mouth 2 (two) times daily., Disp: 180 tablet, Rfl: 3   pantoprazole  sodium (PROTONIX ) 40 mg, PLACE 40 MG INTO FEEDING TUBE DAILY AS DIRECTED, Disp: 30 packet, Rfl: 5   polyethylene glycol (MIRALAX  / GLYCOLAX ) 17 g packet, Place 17 g into feeding tube daily as needed for moderate constipation., Disp: , Rfl:    predniSONE  (DELTASONE ) 10 MG tablet, Take 2 tablets (20 mg total) by mouth daily with breakfast., Disp: 10 tablet,  Rfl: 0   prochlorperazine  (COMPAZINE ) 10 MG tablet, Take 1 tablet (10 mg total) by mouth every 6 (six) hours as needed (Nausea or vomiting)., Disp: 30 tablet, Rfl: 1   [Paused] rosuvastatin  (CRESTOR ) 40 MG tablet, Take 1 tablet (40 mg total) by mouth daily., Disp: 90 tablet, Rfl: 3  "

## 2024-10-02 NOTE — Patient Instructions (Addendum)
 Resume trelegy ellipta 1 puff daily - rinse mouth out after each use  Continue albuterol  inhaler as needed  Use albuterol  nebulizer solution as needed every 4-6 hours as needed to help with cough and mucous clearance  Use the flutter valve, 5-7 good puffs through the device 2-3 times daily to help with mucous clearance  Take augmentin  twice daily if you feel pneumonia symptoms are returning while traveling   Take prednisone  20mg  daily for 5 days if pneumonia symptoms return  Follow up in 3 months

## 2024-10-03 ENCOUNTER — Other Ambulatory Visit: Payer: Self-pay

## 2024-10-03 ENCOUNTER — Ambulatory Visit: Admitting: Physical Therapy

## 2024-10-03 ENCOUNTER — Encounter: Payer: Self-pay | Admitting: Physical Therapy

## 2024-10-03 DIAGNOSIS — C109 Malignant neoplasm of oropharynx, unspecified: Secondary | ICD-10-CM

## 2024-10-03 DIAGNOSIS — I890 Lymphedema, not elsewhere classified: Secondary | ICD-10-CM

## 2024-10-03 DIAGNOSIS — M6281 Muscle weakness (generalized): Secondary | ICD-10-CM

## 2024-10-03 DIAGNOSIS — R293 Abnormal posture: Secondary | ICD-10-CM

## 2024-10-03 NOTE — Therapy (Signed)
 " OUTPATIENT PHYSICAL THERAPY HEAD AND NECK POST RADIATION TREATMENT   Patient Name: Gilbert Thompson MRN: [id number] DOB:11-22-1950, 74 y.o., male Today's Date: 10/03/2024  END OF SESSION:  PT End of Session - 10/03/24 1205     Visit Number 5    Number of Visits 10    Date for Recertification  10/16/24    PT Start Time 1204    PT Stop Time 1245   pt had to leave early because he had another appt   PT Time Calculation (min) 41 min    Activity Tolerance Patient tolerated treatment well    Behavior During Therapy WFL for tasks assessed/performed          Past Medical History:  Diagnosis Date   Asthma    Cancer (HCC)    prostate cancer   Coronary artery disease    pt states they were unable to place a stent at that time   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, follwed by [facility]   Dyspnea    with exertion   GERD (gastroesophageal reflux disease)    Hyperlipidemia    Hypertension    Pneumonia    Past Surgical History:  Procedure Laterality Date   CATARACT EXTRACTION Bilateral    IR GASTROSTOMY TUBE MOD SED  06/23/2024   IR IMAGING GUIDED PORT INSERTION  06/23/2024   LEFT HEART CATH AND CORONARY ANGIOGRAPHY N/A 06/05/2022   Procedure: LEFT HEART CATH AND CORONARY ANGIOGRAPHY;  Surgeon: Claudene Victory ORN, MD;  Location: [facility];  Service: Cardiovascular;  Laterality: N/A;   LYMPHADENECTOMY Bilateral 04/29/2023   Procedure: BILATERAL PELVIC LYMPHADENECTOMY;  Surgeon: Renda Glance, MD;  Location: [facility];  Service: Urology;  Laterality: Bilateral;   PROSTATE BIOPSY     ROBOT ASSISTED LAPAROSCOPIC RADICAL PROSTATECTOMY N/A 04/29/2023   Procedure: XI ROBOTIC ASSISTED LAPAROSCOPIC RADICAL PROSTATECTOMY LEVEL 2;  Surgeon: Renda Glance, MD;  Location: [facility];  Service: Urology;  Laterality: N/A;  210 MINUTES NEEDED FOR CASE   UPPER GASTROINTESTINAL ENDOSCOPY     VENTRAL HERNIA REPAIR N/A 10/05/2023   Procedure: LAPAROSCOPIC VENTRAL HERNIA REPAAIR WITH MESH;   Surgeon: Rubin Calamity, MD;  Location: [facility] OR;  Service: General;  Laterality: N/A;   Patient Active Problem List   Diagnosis Date Noted   Malnutrition of moderate degree 08/16/2024   Diarrhea 08/15/2024   Antineoplastic chemotherapy induced pancytopenia 08/15/2024   HCAP (healthcare-associated pneumonia) 08/14/2024   Sepsis (HCC) 08/11/2024   Protein-calorie malnutrition, severe 08/09/2024   Sepsis due to undetermined organism (HCC) 08/07/2024   Neutropenia with fever 08/07/2024   Anemia 08/07/2024   Hyponatremia 08/07/2024   Moderate protein malnutrition 08/07/2024   Transaminitis 08/07/2024   Oral thrush 08/07/2024   Multifocal pneumonia 08/07/2024   Sinus tachycardia 08/07/2024   Hypomagnesemia 08/07/2024   Hypophosphatemia 08/07/2024   Neutropenia 08/07/2024   Leukopenia due to antineoplastic chemotherapy 07/26/2024   Cancer related pain 06/08/2024   Oropharyngeal cancer (HCC) 06/07/2024   Neck mass 04/23/2024   Genetic testing 04/23/2023   Malignant neoplasm of prostate (HCC) 02/24/2023   Dysphonia 11/25/2022   Coronary artery disease involving native coronary artery of native heart without angina pectoris    DOE (dyspnea on exertion)     PCP: Maude Sprague, MD   REFERRING PROVIDER: Lauraine Golden, MD   REFERRING DIAG: C10.9 (ICD-10-CM) - Primary squamous cell carcinoma of oropharynx (HCC)   THERAPY DIAG:  Lymphedema, not elsewhere classified  Muscle weakness (generalized)  Abnormal posture  Primary squamous cell carcinoma of oropharynx (HCC)  Rationale for Evaluation and Treatment: Rehabilitation  ONSET DATE: 05/15/24  SUBJECTIVE:                                                                                                                                                                                           SUBJECTIVE STATEMENT: I wear the chip pack a lot. I have been trying to do the massage.   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 He presented to his PCP with complaints of chronic pharyngitis and a left submandibular mass that has persisted over the past year. CT scans were ordered. 04/06/24 CT neck/chest showed a 3.0 x 2.4 x 2.5 cm soft tissue mass centered in the left oropharynx at the glossotonsillar sulcus with possible involvement of the adjacent tongue base. Scans also noted scattered tiny pulmonary nodules are unchanged and likely benign but with only 5 months of documented stability but with recommend attention on follow-up given his history of prostate cancer. 04/24/24 He saw Dr. Graig who performed a FNA biopsy of left neck. However, biopsy was inconclusive. As a result, he then underwent a laryngoscopy with biopsy on 05/15/24 under the care of Dr. Lauralee. 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 Tumor is moderately differentiated with foci of poor differentiation and invasion into the striated muscle. Immunohistochemical studies are p16, p40, and HRHPV are positive.  05/09/24 MRI cervical spine revealing no evidence of metastatic disease. 04/26/24 PET showing FDG avid mass along the left tongue base/oropharynx with left cervical chain nodal metastatic disease with no suspicious pulmonary or mediastinal FDG uptake. He will receive 35 fractions of radiation to his Oropharynx and bilateral neck with weekly chemotherapy. He will start on 06/27/24 and complete 08/14/24. 06/23/24. PEG/PAC. History of prostate cancer, no radiation had surgery. Pt reports 80% blockage of LAD. Hospitalized for pneumonia in Nov 2025.   PATIENT GOALS:  Reassess how my recovery is going related to neck ROM, cervical pain, fatigue, and swelling.  PAIN:  Are you having pain? Yes NPRS scale: 6/10 Pain location: throat Pain orientation: Left  PAIN TYPE: raw Pain description: constant  Aggravating factors: eating, brushing teeth Relieving factors:  nothing  PRECAUTIONS: Recent radiation, Head and neck lymphedema risk,    OBJECTIVE:   POSTURE:  Forward head and rounded shoulders posture  30 SEC SIT TO STAND: 09/18/24: 16 reps in 30 sec without use UEs which is good for patients age  Baseline 06/22/24: 13 reps in 30 sec without use of UEs which is  Average for patient's age.   SHOULDER AROM:   At baseline pt reported L shoulder was uncomfortable to sleep on but both shoulders were [facility]  CERVICAL AROM:     Percent limited 09/18/24  Flexion WFL WFL  Extension 75% limited WFL  Right lateral flexion 75% limited 75% limited  Left lateral flexion 75% limited 85% limited  Right rotation 50% limited 50% limited  Left rotation 50% limited 50% limited                          (Blank rows=not tested)  LYMPHEDEMA ASSESSMENT:    Circumference in cm  4 cm superior to sternal notch around neck 39.2  6 cm superior to sternal notch around neck 39.5  8 cm superior to sternal notch around neck 41.2  R lateral nostril from base of nose to medial tragus   L lateral nostril from base of nose to medial tragus   R corner of mouth to where ear lobe meets face   L corner of mouth to where ear lobe meets face         (Blank rows=not tested)  LE STRENGTH: Bilateral hips 4/5, bilateral knee extension 5/5, ankle DF 5/5  Neck Disability Index score: 7 / 50 = 14.0 %  BALANCE: SLS on R - 7 sec, on L - 20 sec  OTHER SYMPTOMS: Pain Yes Fibrosis Yes Pitting edema No Infections No Decreased scar mobility No  TREATMENT PERFORMED: 10/03/24: Manual Therapy Instructed pt using Norton anterior approach handout as follows: short neck, 5 diaphragmatic breaths, bilateral axillary nodes, bilateral pectoral nodes, anterior chest, short neck, posterior neck moving fluid towards pathway aimed at lateral neck, then lateral and anterior neck moving fluid towards pathway aimed at lateral neck then retracing all steps. Had pt demonstrate initially and provided  v/c and t/c to achieve proper skin stretch as initially pt was sliding hand over skin but was able to correct with cueing.    09/28/24: Self Care At beginning of session reviewed self MLD with having pt wit in chair in front of mirror and PTA stood behind pt to perform whole Norton anterior approach sequence with pt returning steps with therapist throughout. Pts wife, with his permission, also video recorded for further reinforcement at home. Tactile and VC's were offered during for further education and correction of technique. Manual Therapy After instruction then PTA performed whole sequence again with pt supine per handout and continued to have pt return some steps for continued instruction.   09/26/24: Self Care Initially educated pt and spouse in basic anatomy of lymphatic system and sequence of MLD answering their questions during. Also answered their questions about H&N compression garment options and showed them [employer] professor [employer]. They want to try the [facility] garment so will submit this through [employer] to see if insurance will cover any of price. Otherwise, they said they are able to pay OOP for this one if not.  Manual Therapy MLD to H&N using Norton anterior approach and handout. Excluded step 7 as this wasn't necessary due to anatomy of pt. Therapist performed most of sequence having wife and pt return steps briefly to begin instruction of light pressure and correct skin stretch, not slide.  09/18/24: Created foam chip pack for pt to wear for lymphedema, educated pt in basic skin stretch technique for anterior neck    PATIENT EDUCATION:  Education details: Self MLD using Norton anterior approach Person educated: Patient and  Spouse Education method: Medical Illustrator, tactile and VC's, handout issued Education comprehension: verbalized understanding, returned demo, tactile cues and will benefit from further review  HOME EXERCISE PROGRAM: Reviewed  previously given post op HEP. Self MLD  ASSESSMENT:  CLINICAL IMPRESSION: Pt has been wearing his compression garmetn ferquently and has been practicing self MLD. Continued to educate pt in porper self MLD technique including focus on proper skin stretch with improved form following cueing today.   Pt will benefit from skilled therapeutic intervention to improve on the following deficits: decreased knowledge of condition, decreased knowledge of use of DME, decreased ROM, decreased strength, increased edema, increased fascial restrictions, postural dysfunction, and pain  PT treatment/interventions: ADL/Self care home management, [id number]- PT Re-evaluation, 97110-Therapeutic exercises, 97530- Therapeutic activity, [id number]- Neuromuscular re-education, 97535- Self Care, [id number]- Manual therapy, [id number]- Orthotic Initial, [id number]- Orthotic/Prosthetic subsequent, and Patient/Family education     GOALS: Goals reviewed with patient? Yes  GOALS MET AT EVAL:   Name Target Date  Goal status  1 Patient will be able to verbalize understanding of a home exercise program for cervical range of motion, posture, and walking.   Baseline:  No knowledge Eval Achieved at eval  2 Patient will be able to verbalize understanding of proper sitting and standing posture. Baseline:  No knowledge Eval Achieved at eval  3 Patient will be able to verbalize understanding of lymphedema risk and availability of treatment for this condition Baseline:  No knowledge Eval Achieved at eval     LONG TERM GOALS:  (STG=LTG)  GOALS Name Target Date  Goal status  1 Pt will demonstrate a return to baseline cervical ROM measurements and not demonstrate any signs or symptoms of lymphedema. Baseline: 10/16/24 NEW  2 Pt will be independent in self MLD for long term management of neck lymphedema. 10/16/24 NEW  3 Pt will be independent in a home exercise program for stretching and strengthening and for improved posture. 10/16/24 NEW  4 Pt will  demonstrate 5/5 bilateral hip flexion strength to decrease fall risk. 10/16/24  NEW       PLAN:  PT FREQUENCY/DURATION: 2x/wk for 4 wks  PLAN FOR NEXT SESSION: Review MLD in front of mirror PRN for reinforcement; Cont with MLD for anterior neck lymphedema reviewing with pt and wife PRN, find anything out about the garment? Next if all questions answered from self MLD start posture exercises and begin a HEP, neck ROM, eventually LE strengthening/balance pending clearance from cardiologist 09/26/24: Wendelyn placed order for [facility] swell spot through [facility]  [address]  [place] [address]  [phone]    Florina Sever [place], [place] 10/03/2024, 12:48 PM  "

## 2024-10-04 ENCOUNTER — Other Ambulatory Visit: Payer: Self-pay | Admitting: Oncology

## 2024-10-04 ENCOUNTER — Encounter: Payer: Self-pay | Admitting: Pulmonary Disease

## 2024-10-04 DIAGNOSIS — G893 Neoplasm related pain (acute) (chronic): Secondary | ICD-10-CM

## 2024-10-04 MED ORDER — MORPHINE SULFATE ER 30 MG PO TBCR: 30.0000 mg | EXTENDED_RELEASE_TABLET | Freq: Two times a day (BID) | ORAL | 0 refills | Status: DC

## 2024-10-06 ENCOUNTER — Other Ambulatory Visit (HOSPITAL_COMMUNITY): Payer: Self-pay

## 2024-10-06 ENCOUNTER — Ambulatory Visit

## 2024-10-06 ENCOUNTER — Other Ambulatory Visit: Payer: Self-pay | Admitting: Oncology

## 2024-10-06 ENCOUNTER — Telehealth: Payer: Self-pay

## 2024-10-06 DIAGNOSIS — R293 Abnormal posture: Secondary | ICD-10-CM

## 2024-10-06 DIAGNOSIS — I890 Lymphedema, not elsewhere classified: Secondary | ICD-10-CM | POA: Diagnosis not present

## 2024-10-06 DIAGNOSIS — M6281 Muscle weakness (generalized): Secondary | ICD-10-CM

## 2024-10-06 DIAGNOSIS — C109 Malignant neoplasm of oropharynx, unspecified: Secondary | ICD-10-CM

## 2024-10-06 DIAGNOSIS — G893 Neoplasm related pain (acute) (chronic): Secondary | ICD-10-CM

## 2024-10-06 MED ORDER — MORPHINE SULFATE ER 15 MG PO TBCR: 15.0000 mg | EXTENDED_RELEASE_TABLET | Freq: Two times a day (BID) | ORAL | 0 refills | Status: AC

## 2024-10-06 NOTE — Telephone Encounter (Signed)
 Dr. Autumn refilled patient's MS Contin  30 mg last Wednesday per patient's wife's request. The wife just called and reported that patient's MS Contin  30 mg was not refilled and that she had called [facility] and that they said they never received the refill order. In addition, she said patient needed the 15 mg dose now instead of the 30 mg, which was never communicated to me per our communications last Wednesday.   I followed up with [facility] and they said they have the order and there is no problem and that she (the pharmacist) was unaware of patient called. Pharmacist agreed to cancel the current MS Contin  30 mg order, per my request, and await the new, lesser dose order. Dr. Autumn made aware.

## 2024-10-06 NOTE — Telephone Encounter (Signed)
 Spoke with wife via phone call and informed her that patient's new Rx for lower dose of MS Contin  15 mg has been sent to [facility]. Wife, Heron, understood and agreed with this update.

## 2024-10-06 NOTE — Therapy (Signed)
 " OUTPATIENT PHYSICAL THERAPY HEAD AND NECK POST RADIATION TREATMENT   Patient Name: Gilbert Thompson MRN: [id number] DOB:26-Sep-1950, 74 y.o., male Today's Date: 10/06/2024  END OF SESSION:  PT End of Session - 10/06/24 1121     Visit Number 6    Number of Visits 10    Date for Recertification  10/16/24    PT Start Time 1118   pt arrived late from MD appt   PT Stop Time 1157    PT Time Calculation (min) 39 min    Activity Tolerance Patient tolerated treatment well    Behavior During Therapy WFL for tasks assessed/performed          Past Medical History:  Diagnosis Date   Asthma    Cancer (HCC)    prostate cancer   Coronary artery disease    pt states they were unable to place a stent at that time   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, follwed by [facility]   Dyspnea    with exertion   GERD (gastroesophageal reflux disease)    Hyperlipidemia    Hypertension    Pneumonia    Past Surgical History:  Procedure Laterality Date   CATARACT EXTRACTION Bilateral    IR GASTROSTOMY TUBE MOD SED  06/23/2024   IR IMAGING GUIDED PORT INSERTION  06/23/2024   LEFT HEART CATH AND CORONARY ANGIOGRAPHY N/A 06/05/2022   Procedure: LEFT HEART CATH AND CORONARY ANGIOGRAPHY;  Surgeon: Claudene Victory ORN, MD;  Location: [facility];  Service: Cardiovascular;  Laterality: N/A;   LYMPHADENECTOMY Bilateral 04/29/2023   Procedure: BILATERAL PELVIC LYMPHADENECTOMY;  Surgeon: Renda Glance, MD;  Location: [facility];  Service: Urology;  Laterality: Bilateral;   PROSTATE BIOPSY     ROBOT ASSISTED LAPAROSCOPIC RADICAL PROSTATECTOMY N/A 04/29/2023   Procedure: XI ROBOTIC ASSISTED LAPAROSCOPIC RADICAL PROSTATECTOMY LEVEL 2;  Surgeon: Renda Glance, MD;  Location: [facility];  Service: Urology;  Laterality: N/A;  210 MINUTES NEEDED FOR CASE   UPPER GASTROINTESTINAL ENDOSCOPY     VENTRAL HERNIA REPAIR N/A 10/05/2023   Procedure: LAPAROSCOPIC VENTRAL HERNIA REPAAIR WITH MESH;  Surgeon: Rubin Calamity,  MD;  Location: [facility] OR;  Service: General;  Laterality: N/A;   Patient Active Problem List   Diagnosis Date Noted   Malnutrition of moderate degree 08/16/2024   Diarrhea 08/15/2024   Antineoplastic chemotherapy induced pancytopenia 08/15/2024   HCAP (healthcare-associated pneumonia) 08/14/2024   Sepsis (HCC) 08/11/2024   Protein-calorie malnutrition, severe 08/09/2024   Sepsis due to undetermined organism (HCC) 08/07/2024   Neutropenia with fever 08/07/2024   Anemia 08/07/2024   Hyponatremia 08/07/2024   Moderate protein malnutrition 08/07/2024   Transaminitis 08/07/2024   Oral thrush 08/07/2024   Multifocal pneumonia 08/07/2024   Sinus tachycardia 08/07/2024   Hypomagnesemia 08/07/2024   Hypophosphatemia 08/07/2024   Neutropenia 08/07/2024   Leukopenia due to antineoplastic chemotherapy 07/26/2024   Cancer related pain 06/08/2024   Oropharyngeal cancer (HCC) 06/07/2024   Neck mass 04/23/2024   Genetic testing 04/23/2023   Malignant neoplasm of prostate (HCC) 02/24/2023   Dysphonia 11/25/2022   Coronary artery disease involving native coronary artery of native heart without angina pectoris    DOE (dyspnea on exertion)     PCP: Maude Sprague, MD   REFERRING PROVIDER: Lauraine Golden, MD   REFERRING DIAG: C10.9 (ICD-10-CM) - Primary squamous cell carcinoma of oropharynx (HCC)   THERAPY DIAG:  Lymphedema, not elsewhere classified  Muscle weakness (generalized)  Abnormal posture  Primary squamous cell  carcinoma of oropharynx (HCC)  Rationale for Evaluation and Treatment: Rehabilitation  ONSET DATE: 05/15/24  SUBJECTIVE:                                                                                                                                                                                           SUBJECTIVE STATEMENT: I wear the chip pack a lot. I have been trying to do the massage.   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 He  presented to his PCP with complaints of chronic pharyngitis and a left submandibular mass that has persisted over the past year. CT scans were ordered. 04/06/24 CT neck/chest showed a 3.0 x 2.4 x 2.5 cm soft tissue mass centered in the left oropharynx at the glossotonsillar sulcus with possible involvement of the adjacent tongue base. Scans also noted scattered tiny pulmonary nodules are unchanged and likely benign but with only 5 months of documented stability but with recommend attention on follow-up given his history of prostate cancer. 04/24/24 He saw Dr. Graig who performed a FNA biopsy of left neck. However, biopsy was inconclusive. As a result, he then underwent a laryngoscopy with biopsy on 05/15/24 under the care of Dr. Lauralee. 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 Tumor is moderately differentiated with foci of poor differentiation and invasion into the striated muscle. Immunohistochemical studies are p16, p40, and HRHPV are positive.  05/09/24 MRI cervical spine revealing no evidence of metastatic disease. 04/26/24 PET showing FDG avid mass along the left tongue base/oropharynx with left cervical chain nodal metastatic disease with no suspicious pulmonary or mediastinal FDG uptake. He will receive 35 fractions of radiation to his Oropharynx and bilateral neck with weekly chemotherapy. He will start on 06/27/24 and complete 08/14/24. 06/23/24. PEG/PAC. History of prostate cancer, no radiation had surgery. Pt reports 80% blockage of LAD. Hospitalized for pneumonia in Nov 2025.   PATIENT GOALS:  Reassess how my recovery is going related to neck ROM, cervical pain, fatigue, and swelling.  PAIN:  Are you having pain? Yes NPRS scale: 6/10 Pain location: throat Pain orientation: Left  PAIN TYPE: raw Pain description: constant  Aggravating factors: eating, brushing teeth Relieving factors: nothing  PRECAUTIONS: Recent radiation,  Head and neck lymphedema risk,    OBJECTIVE:   POSTURE:  Forward head and rounded shoulders posture  30 SEC SIT TO STAND: 09/18/24: 16 reps in 30 sec without use UEs which is good for patients age  Baseline 06/22/24: 13 reps in 30 sec without use of UEs which is  Average for  patient's age.   SHOULDER AROM:   At baseline pt reported L shoulder was uncomfortable to sleep on but both shoulders were [facility]  CERVICAL AROM:     Percent limited 09/18/24  Flexion [facility] WFL  Extension 75% limited WFL  Right lateral flexion 75% limited 75% limited  Left lateral flexion 75% limited 85% limited  Right rotation 50% limited 50% limited  Left rotation 50% limited 50% limited                          (Blank rows=not tested)  LYMPHEDEMA ASSESSMENT:    Circumference in cm Circumference in cm   09/18/24 10/06/24  4 cm superior to sternal notch around neck 39.2 38.8  6 cm superior to sternal notch around neck 39.5 38.9  8 cm superior to sternal notch around neck 41.2 40.5  R lateral nostril from base of nose to medial tragus    L lateral nostril from base of nose to medial tragus    R corner of mouth to where ear lobe meets face    L corner of mouth to where ear lobe meets face            (Blank rows=not tested)  LE STRENGTH: Bilateral hips 4/5, bilateral knee extension 5/5, ankle DF 5/5  Neck Disability Index score: 7 / 50 = 14.0 %  BALANCE: SLS on R - 7 sec, on L - 20 sec  OTHER SYMPTOMS: Pain Yes Fibrosis Yes Pitting edema No Infections No Decreased scar mobility No  TREATMENT PERFORMED: 10/06/24: anual Therapy MLD per [facility] anterior approach as follows: short neck, 5 diaphragmatic breaths, bilateral axillary nodes, bilateral pectoral nodes, anterior chest, short neck, posterior neck moving fluid towards pathway aimed at lateral neck, then lateral and anterior neck moving fluid towards pathway aimed at lateral neck then retracing all steps. Had pt return some steps to assess his  technique. His pressure is still a bit heavy with a slight skin slide at end of stretch. Corrected this with hand over hand pressure and he was then able to return correct demo.   10/03/24: Manual Therapy Instructed pt using [facility] anterior approach handout as follows: short neck, 5 diaphragmatic breaths, bilateral axillary nodes, bilateral pectoral nodes, anterior chest, short neck, posterior neck moving fluid towards pathway aimed at lateral neck, then lateral and anterior neck moving fluid towards pathway aimed at lateral neck then retracing all steps. Had pt demonstrate initially and provided v/c and t/c to achieve proper skin stretch as initially pt was sliding hand over skin but was able to correct with cueing.    09/28/24: Self Care At beginning of session reviewed self MLD with having pt wit in chair in front of mirror and PTA stood behind pt to perform whole [facility] anterior approach sequence with pt returning steps with therapist throughout. Pts wife, with his permission, also video recorded for further reinforcement at home. Tactile and VC's were offered during for further education and correction of technique. Manual Therapy After instruction then PTA performed whole sequence again with pt supine per handout and continued to have pt return some steps for continued instruction.      PATIENT EDUCATION:  Education details: Self MLD using [facility] anterior approach Person educated: Patient and Spouse Education method: Medical Illustrator, actor and VC's, handout issued Education comprehension: verbalized understanding, returned demo, tactile cues and will benefit from further review  HOME EXERCISE PROGRAM: Reviewed previously given post op HEP. Self MLD  ASSESSMENT:  CLINICAL IMPRESSION: Pt reports his new compression garment is going fairly well. He interchanges wearing that with the chip pack. He does feel the H&N garment stays on his head better than the chip pack and is  glad he got that. Today pt arrived late from an MD appt at the [facility]. Continued with MLD to H&N per [facility] anterior approach and reviewed this with pt while performing. He does still benefit from reinforcement of lighter pressure and skin stretch only but he is doing better. He reports using the video his wife took has been helpful for review at home.   Pt will benefit from skilled therapeutic intervention to improve on the following deficits: decreased knowledge of condition, decreased knowledge of use of DME, decreased ROM, decreased strength, increased edema, increased fascial restrictions, postural dysfunction, and pain  PT treatment/interventions: ADL/Self care home management, [id number]- PT Re-evaluation, 97110-Therapeutic exercises, 97530- Therapeutic activity, [id number]- Neuromuscular re-education, 97535- Self Care, [id number]- Manual therapy, [id number]- Orthotic Initial, [id number]- Orthotic/Prosthetic subsequent, and Patient/Family education     GOALS: Goals reviewed with patient? Yes  GOALS MET AT EVAL:   Name Target Date  Goal status  1 Patient will be able to verbalize understanding of a home exercise program for cervical range of motion, posture, and walking.   Baseline:  No knowledge Eval Achieved at eval  2 Patient will be able to verbalize understanding of proper sitting and standing posture. Baseline:  No knowledge Eval Achieved at eval  3 Patient will be able to verbalize understanding of lymphedema risk and availability of treatment for this condition Baseline:  No knowledge Eval Achieved at eval     LONG TERM GOALS:  (STG=LTG)  GOALS Name Target Date  Goal status  1 Pt will demonstrate a return to baseline cervical ROM measurements and not demonstrate any signs or symptoms of lymphedema. Baseline: 10/16/24 NEW  2 Pt will be independent in self MLD for long term management of neck lymphedema. 10/16/24 ONGOING  3 Pt will be independent in a home exercise program for stretching and  strengthening and for improved posture. 10/16/24 NEW  4 Pt will demonstrate 5/5 bilateral hip flexion strength to decrease fall risk. 10/16/24  NEW       PLAN:  PT FREQUENCY/DURATION: 2x/wk for 4 wks  PLAN FOR NEXT SESSION: Cont MLD and review prn, Next start posture exercises and begin a HEP, neck ROM, eventually LE strengthening/balance pending clearance from cardiologist  09/26/24: Val placed order for [facility] swell spot through [facility]  [address]  [place] [address]  [phone]    Aden Berwyn Caldron, PTA 10/06/2024, 12:07 PM  "

## 2024-10-09 ENCOUNTER — Ambulatory Visit

## 2024-10-09 DIAGNOSIS — C109 Malignant neoplasm of oropharynx, unspecified: Secondary | ICD-10-CM

## 2024-10-09 DIAGNOSIS — I890 Lymphedema, not elsewhere classified: Secondary | ICD-10-CM | POA: Diagnosis not present

## 2024-10-09 DIAGNOSIS — M6281 Muscle weakness (generalized): Secondary | ICD-10-CM

## 2024-10-09 DIAGNOSIS — R293 Abnormal posture: Secondary | ICD-10-CM

## 2024-10-09 NOTE — Therapy (Signed)
 " OUTPATIENT PHYSICAL THERAPY HEAD AND NECK POST RADIATION TREATMENT   Patient Name: Gilbert Thompson MRN: [id number] DOB:1950-12-15, 74 y.o., male Today's Date: 10/09/2024  END OF SESSION:  PT End of Session - 10/09/24 1508     Visit Number 7    Number of Visits 10    Date for Recertification  10/16/24    PT Start Time 1505    PT Stop Time 1546   pt reports has to leave at 345 today   PT Time Calculation (min) 41 min    Activity Tolerance Patient tolerated treatment well    Behavior During Therapy [facility] for tasks assessed/performed          Past Medical History:  Diagnosis Date   Asthma    Cancer (HCC)    prostate cancer   Coronary artery disease    pt states they were unable to place a stent at that time   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, follwed by [facility]   Dyspnea    with exertion   GERD (gastroesophageal reflux disease)    Hyperlipidemia    Hypertension    Pneumonia    Past Surgical History:  Procedure Laterality Date   CATARACT EXTRACTION Bilateral    IR GASTROSTOMY TUBE MOD SED  06/23/2024   IR IMAGING GUIDED PORT INSERTION  06/23/2024   LEFT HEART CATH AND CORONARY ANGIOGRAPHY N/A 06/05/2022   Procedure: LEFT HEART CATH AND CORONARY ANGIOGRAPHY;  Surgeon: Claudene Victory ORN, MD;  Location: [facility];  Service: Cardiovascular;  Laterality: N/A;   LYMPHADENECTOMY Bilateral 04/29/2023   Procedure: BILATERAL PELVIC LYMPHADENECTOMY;  Surgeon: Renda Glance, MD;  Location: [facility];  Service: Urology;  Laterality: Bilateral;   PROSTATE BIOPSY     ROBOT ASSISTED LAPAROSCOPIC RADICAL PROSTATECTOMY N/A 04/29/2023   Procedure: XI ROBOTIC ASSISTED LAPAROSCOPIC RADICAL PROSTATECTOMY LEVEL 2;  Surgeon: Renda Glance, MD;  Location: [facility];  Service: Urology;  Laterality: N/A;  210 MINUTES NEEDED FOR CASE   UPPER GASTROINTESTINAL ENDOSCOPY     VENTRAL HERNIA REPAIR N/A 10/05/2023   Procedure: LAPAROSCOPIC VENTRAL HERNIA REPAAIR WITH MESH;  Surgeon: Rubin Calamity, MD;  Location: [facility] OR;  Service: General;  Laterality: N/A;   Patient Active Problem List   Diagnosis Date Noted   Malnutrition of moderate degree 08/16/2024   Diarrhea 08/15/2024   Antineoplastic chemotherapy induced pancytopenia 08/15/2024   HCAP (healthcare-associated pneumonia) 08/14/2024   Sepsis (HCC) 08/11/2024   Protein-calorie malnutrition, severe 08/09/2024   Sepsis due to undetermined organism (HCC) 08/07/2024   Neutropenia with fever 08/07/2024   Anemia 08/07/2024   Hyponatremia 08/07/2024   Moderate protein malnutrition 08/07/2024   Transaminitis 08/07/2024   Oral thrush 08/07/2024   Multifocal pneumonia 08/07/2024   Sinus tachycardia 08/07/2024   Hypomagnesemia 08/07/2024   Hypophosphatemia 08/07/2024   Neutropenia 08/07/2024   Leukopenia due to antineoplastic chemotherapy 07/26/2024   Cancer related pain 06/08/2024   Oropharyngeal cancer (HCC) 06/07/2024   Neck mass 04/23/2024   Genetic testing 04/23/2023   Malignant neoplasm of prostate (HCC) 02/24/2023   Dysphonia 11/25/2022   Coronary artery disease involving native coronary artery of native heart without angina pectoris    DOE (dyspnea on exertion)     PCP: Maude Sprague, MD   REFERRING PROVIDER: Lauraine Golden, MD   REFERRING DIAG: C10.9 (ICD-10-CM) - Primary squamous cell carcinoma of oropharynx (HCC)   THERAPY DIAG:  Lymphedema, not elsewhere classified  Muscle weakness (generalized)  Abnormal posture  Primary  squamous cell carcinoma of oropharynx (HCC)  Rationale for Evaluation and Treatment: Rehabilitation  ONSET DATE: 05/15/24  SUBJECTIVE:                                                                                                                                                                                           SUBJECTIVE STATEMENT: I need to leave early for an appt at the [facility]. I'm wearing the chip pack or H&N compression garment often.   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 He presented to his PCP with complaints of chronic pharyngitis and a left submandibular mass that has persisted over the past year. CT scans were ordered. 04/06/24 CT neck/chest showed a 3.0 x 2.4 x 2.5 cm soft tissue mass centered in the left oropharynx at the glossotonsillar sulcus with possible involvement of the adjacent tongue base. Scans also noted scattered tiny pulmonary nodules are unchanged and likely benign but with only 5 months of documented stability but with recommend attention on follow-up given his history of prostate cancer. 04/24/24 He saw Dr. Graig who performed a FNA biopsy of left neck. However, biopsy was inconclusive. As a result, he then underwent a laryngoscopy with biopsy on 05/15/24 under the care of Dr. Lauralee. 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 Tumor is moderately differentiated with foci of poor differentiation and invasion into the striated muscle. Immunohistochemical studies are p16, p40, and HRHPV are positive.  05/09/24 MRI cervical spine revealing no evidence of metastatic disease. 04/26/24 PET showing FDG avid mass along the left tongue base/oropharynx with left cervical chain nodal metastatic disease with no suspicious pulmonary or mediastinal FDG uptake. He will receive 35 fractions of radiation to his Oropharynx and bilateral neck with weekly chemotherapy. He will start on 06/27/24 and complete 08/14/24. 06/23/24. PEG/PAC. History of prostate cancer, no radiation had surgery. Pt reports 80% blockage of LAD. Hospitalized for pneumonia in Nov 2025.   PATIENT GOALS:  Reassess how my recovery is going related to neck ROM, cervical pain, fatigue, and swelling.  PAIN:  Are you having pain? Yes NPRS scale: 6/10 Pain location: throat Pain orientation: Left  PAIN TYPE: raw Pain description: constant  Aggravating factors: eating, brushing  teeth Relieving factors: nothing  PRECAUTIONS: Recent radiation, Head and neck lymphedema risk,    OBJECTIVE:   POSTURE:  Forward head and rounded shoulders posture  30 SEC SIT TO STAND: 09/18/24: 16 reps in 30 sec without use UEs which is good for patients age  Baseline 06/22/24: 13 reps in 30 sec  without use of UEs which is  Average for patient's age.   SHOULDER AROM:   At baseline pt reported L shoulder was uncomfortable to sleep on but both shoulders were [facility]  CERVICAL AROM:     Percent limited 09/18/24  Flexion [facility] WFL  Extension 75% limited WFL  Right lateral flexion 75% limited 75% limited  Left lateral flexion 75% limited 85% limited  Right rotation 50% limited 50% limited  Left rotation 50% limited 50% limited                          (Blank rows=not tested)  LYMPHEDEMA ASSESSMENT:    Circumference in cm Circumference in cm   09/18/24 10/06/24  4 cm superior to sternal notch around neck 39.2 38.8  6 cm superior to sternal notch around neck 39.5 38.9  8 cm superior to sternal notch around neck 41.2 40.5  R lateral nostril from base of nose to medial tragus    L lateral nostril from base of nose to medial tragus    R corner of mouth to where ear lobe meets face    L corner of mouth to where ear lobe meets face            (Blank rows=not tested)  LE STRENGTH: Bilateral hips 4/5, bilateral knee extension 5/5, ankle DF 5/5  Neck Disability Index score: 7 / 50 = 14.0 %  BALANCE: SLS on R - 7 sec, on L - 20 sec  OTHER SYMPTOMS: Pain Yes Fibrosis Yes Pitting edema No Infections No Decreased scar mobility No  TREATMENT PERFORMED: 10/09/24: Neuro Re Ed In // bars: Tandem stance practice x 30 sec each; bil SLS x 15 sec each; pt demonstrates ankle instability with both exs and reports feeling challenged. Also educated him that he can try same but with EC, but when standing at counter only for UE support prn! Then heel raises x 10, and Rt then Lt heel raise x 5  each with VC's for slow, controlled pace with these; lastly 6 step ups with 3 SLS on step with each rep x 10 each leg; handout issued Therapeutic Activities Supine Scapular Series with red theraband: Horz abd x 10, then x 5 reps of following: bil UE er, narrow and wide grip flex and D2 bil returning therapist demo for each; added to HEP Manual Therapy MLD per [facility] anterior approach as follows: short neck, 5 diaphragmatic breaths, bilateral axillary nodes, bilateral pectoral nodes, anterior chest, short neck, posterior neck moving fluid towards pathway aimed at lateral neck, then lateral and anterior neck moving fluid towards pathway aimed at lateral neck then retracing all steps. Had pt return some steps to assess his technique.   10/06/24: Manual Therapy MLD per [facility] anterior approach as follows: short neck, 5 diaphragmatic breaths, bilateral axillary nodes, bilateral pectoral nodes, anterior chest, short neck, posterior neck moving fluid towards pathway aimed at lateral neck, then lateral and anterior neck moving fluid towards pathway aimed at lateral neck then retracing all steps. Had pt return some steps to assess his technique. His pressure is still a bit heavy with a slight skin slide at end of stretch. Corrected this with hand over hand pressure and he was then able to return correct demo.   10/03/24: Manual Therapy Instructed pt using [facility] anterior approach handout as follows: short neck, 5 diaphragmatic breaths, bilateral axillary nodes, bilateral pectoral nodes, anterior chest, short neck, posterior neck moving fluid towards pathway aimed  at lateral neck, then lateral and anterior neck moving fluid towards pathway aimed at lateral neck then retracing all steps. Had pt demonstrate initially and provided v/c and t/c to achieve proper skin stretch as initially pt was sliding hand over skin but was able to correct with cueing.        PATIENT EDUCATION:  Education details: Balance  activities and supine scapular series with red theraband Person educated: Patient Education method: Medical Illustrator, tactile and VC's, handout issued Education comprehension: verbalized understanding, returned demo, tactile cues and will benefit from further review  HOME EXERCISE PROGRAM: Reviewed previously given post op HEP. Self MLD Supine scapular series with red theraband Balance Actvities  ASSESSMENT:  CLINICAL IMPRESSION: Pt had to leave early for an MD appt today. Added balance activities and postural strength as these were also deficits noted at eval. Then continued with MLD to H&N area focusing on anterior throat. Pt has one more appt next week and we began to discuss renewal vs D/C at next session. Pt would like to see if he can come back periodically for check ups for his lymphedema and HEP progression. Encouraged him to speak to [facility], PT who he sees next and he verbalized good understanding.   Pt will benefit from skilled therapeutic intervention to improve on the following deficits: decreased knowledge of condition, decreased knowledge of use of DME, decreased ROM, decreased strength, increased edema, increased fascial restrictions, postural dysfunction, and pain  PT treatment/interventions: ADL/Self care home management, [id number]- PT Re-evaluation, 97110-Therapeutic exercises, 97530- Therapeutic activity, [id number]- Neuromuscular re-education, 97535- Self Care, [id number]- Manual therapy, [id number]- Orthotic Initial, [id number]- Orthotic/Prosthetic subsequent, and Patient/Family education     GOALS: Goals reviewed with patient? Yes  GOALS MET AT EVAL:   Name Target Date  Goal status  1 Patient will be able to verbalize understanding of a home exercise program for cervical range of motion, posture, and walking.   Baseline:  No knowledge Eval Achieved at eval  2 Patient will be able to verbalize understanding of proper sitting and standing posture. Baseline:  No knowledge  Eval Achieved at eval  3 Patient will be able to verbalize understanding of lymphedema risk and availability of treatment for this condition Baseline:  No knowledge Eval Achieved at eval     LONG TERM GOALS:  (STG=LTG)  GOALS Name Target Date  Goal status  1 Pt will demonstrate a return to baseline cervical ROM measurements and not demonstrate any signs or symptoms of lymphedema. Baseline: 10/16/24 NEW  2 Pt will be independent in self MLD for long term management of neck lymphedema. 10/16/24 ONGOING  3 Pt will be independent in a home exercise program for stretching and strengthening and for improved posture. 10/16/24 NEW  4 Pt will demonstrate 5/5 bilateral hip flexion strength to decrease fall risk. 10/16/24  NEW       PLAN:  PT FREQUENCY/DURATION: 2x/wk for 4 wks  PLAN FOR NEXT SESSION: Renewal vs D/C next; Cont MLD and review prn, Review posture and balance exercises and progress HEP prn, neck ROM, reassess LE strengthening to see if this is still a deficit to address from eval.  09/26/24: Val placed order for [employer] swell spot through [facility]  [address], [address]  [place] [address]  [phone]    Aden Berwyn Caldron, PTA 10/09/2024, 3:59 PM   Heel Raise: Bilateral (Standing)   [facility] [phone]    Stand near counter for  fingertip support if needed. Rise on balls of feet. Also do this with one leg at a time, performing slowly and controlled.  Repeat __10-20__ times per set. Do _1-2___ sets per session. Do __2__ sessions per day.  SINGLE LIMB STANCE    Stand at counter (corner if you have one) for minimal arm support. Raise leg. Hold _10-20__ seconds. Repeat with other leg. Once this becomes easier, for increased challenge, close eyes with fingertips on counter for safety. _3-5__ reps per set, _2-3__ sets per day.  Tandem Stance    Stand at counter (corner if you have one). Right foot in front of  left, heel touching toe both feet straight ahead. Stand on Foot Triangle of Support with both feet. Balance in this position _10-20__ seconds. Do with left foot in front of right. Once this becomes easier, for increased challenge, close eyes with fingertips on counter for safety.  Step-Up: Forward    Move step-stool to counter or hold rail if at steps, but as little as able. Step up forward keeping opposite foot off step. Keep pelvis level and back straight. Hold the single leg stand for 3 seconds, then come back down slowly.  Do _10__ times, do _1-2_ sets, on each leg, _1-2__ times per day.    Over Head Pull: Narrow and Wide Grip     On back, knees bent, feet flat, band across thighs, elbows straight but relaxed. Pull hands apart (start). Keeping elbows straight, bring arms up and over head, hands toward floor. Keep pull steady on band. Hold momentarily. Return slowly, keeping pull steady, back to start. Then do same with a wider grip on the band (past shoulder width) Repeat _5-10__ times. Band color __red____   Side Pull: Double Arm   On back, knees bent, feet flat. Arms perpendicular to body, shoulder level, elbows straight but relaxed. Pull arms out to sides, elbows straight. Resistance band comes across collarbones, hands toward floor. Hold momentarily. Slowly return to starting position. Repeat _5-10__ times. Band color _red____   Sword   On back, knees bent, feet flat, left hand on left hip, right hand above left. Pull right arm DIAGONALLY (hip to shoulder) across chest. Bring right arm along head toward floor. Hold momentarily. Slowly return to starting position. Repeat _5-10__ times. Do with left arm. Band color _red_____   Shoulder Rotation: Double Arm   On back, knees bent, feet flat, elbows tucked at sides, bent 90, hands palms up. Pull hands apart and down toward floor, keeping elbows near sides. Hold momentarily. Slowly return to starting position. Repeat _5-10__  times. Band color __red___  "

## 2024-10-09 NOTE — Patient Instructions (Addendum)
 SABRA

## 2024-10-10 ENCOUNTER — Inpatient Hospital Stay: Admitting: Dietician

## 2024-10-10 ENCOUNTER — Telehealth: Payer: Self-pay | Admitting: Dietician

## 2024-10-10 ENCOUNTER — Other Ambulatory Visit: Payer: Self-pay

## 2024-10-10 ENCOUNTER — Telehealth: Payer: Self-pay | Admitting: *Deleted

## 2024-10-10 DIAGNOSIS — C109 Malignant neoplasm of oropharynx, unspecified: Secondary | ICD-10-CM

## 2024-10-10 NOTE — Telephone Encounter (Signed)
 Nutrition Follow-up:  Pt with primary SCC of oropharynx, p16+.  He completed concurrent chemoradiation with weekly cisplatin  (final RT 11/24). Patient is under the care of Dr. Izell and Dr. Autumn.    S/p PEG - 10/3   11/17-11/22  admission with sepsis of undetermined organism 11/24 - 11/26 admission with HCAP  Spoke with wife of patient via telephone. Patient doing well overall. He has not used tube the last 2 weeks other than free water . Patient has had soreness around tube the last few days which has become less tender at time of call. Wife reports abdominal tissue is coming out around the tube. Suspect granulation tissue. Tube is not leaking. Denies fever. Dr. Autumn recommending IR evaluation.   Patient continues to have dry mouth which affects intake of solids. Drinking a lot of water . Patient tolerating oatmeal with extra milk soft boiled eggs, banana pudding, trifle, milkshakes. He is drinking Boost VHC 4-5 daily by mouth. Wife reports they have a trip planned mid February and hoping feeding tube can come out before then.   Denies nausea or vomiting. Pain is improving and weaning from morphine . Constipation resolved with daily miralax .    Medications: reviewed   Labs: 12/30 labs reviewed   Anthropometrics: Wt 171 lb 6.4 oz on 1/12  12/30 - 172 lb  12/8 - 169 lb 3.2 oz    Estimated Energy Needs  Kcals: 2160-2330 Protein: 99-116 Fluid: >2.1 L  NUTRITION DIAGNOSIS: Predicted sub-optimal intake improving   INTERVENTION:  Continue drinking 4-5 Boost VHC  Encourage soft moist textures as tolerated  Continue daily PEG care and FWF Pending IR evaluation - consider d/c of feeding tube given tolerating po nutrition/medication x 2 weeks    MONITORING, EVALUATION, GOAL: wt trends, intake, FT   NEXT VISIT: Wednesday March 4 via telephone

## 2024-10-11 ENCOUNTER — Encounter: Payer: Self-pay | Admitting: Oncology

## 2024-10-11 ENCOUNTER — Ambulatory Visit: Attending: Radiation Oncology

## 2024-10-11 DIAGNOSIS — R490 Dysphonia: Secondary | ICD-10-CM | POA: Diagnosis present

## 2024-10-11 DIAGNOSIS — R1310 Dysphagia, unspecified: Secondary | ICD-10-CM | POA: Insufficient documentation

## 2024-10-11 NOTE — Therapy (Signed)
 " OUTPATIENT SPEECH LANGUAGE PATHOLOGY ONCOLOGY TREATMENT   Patient Name: Gilbert Thompson MRN: [id number] DOB:Nov 20, 1950, 74 y.o., male Today's Date: 10/11/2024  PCP: Windy Coy, MD REFERRING PROVIDER: Izell Domino, MD  END OF SESSION:  End of Session - 10/11/24 1408     Visit Number 4    Number of Visits 6    Date for Recertification  12/05/24    SLP Start Time 1320    SLP Stop Time  1355    SLP Time Calculation (min) 35 min    Activity Tolerance Patient tolerated treatment well            Past Medical History:  Diagnosis Date   Asthma    Cancer (HCC)    prostate cancer   Coronary artery disease    pt states they were unable to place a stent at that time   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, follwed by [facility]   Dyspnea    with exertion   GERD (gastroesophageal reflux disease)    Hyperlipidemia    Hypertension    Pneumonia    Past Surgical History:  Procedure Laterality Date   CATARACT EXTRACTION Bilateral    IR GASTROSTOMY TUBE MOD SED  06/23/2024   IR IMAGING GUIDED PORT INSERTION  06/23/2024   LEFT HEART CATH AND CORONARY ANGIOGRAPHY N/A 06/05/2022   Procedure: LEFT HEART CATH AND CORONARY ANGIOGRAPHY;  Surgeon: Claudene Victory ORN, MD;  Location: [facility];  Service: Cardiovascular;  Laterality: N/A;   LYMPHADENECTOMY Bilateral 04/29/2023   Procedure: BILATERAL PELVIC LYMPHADENECTOMY;  Surgeon: Renda Glance, MD;  Location: [facility];  Service: Urology;  Laterality: Bilateral;   PROSTATE BIOPSY     ROBOT ASSISTED LAPAROSCOPIC RADICAL PROSTATECTOMY N/A 04/29/2023   Procedure: XI ROBOTIC ASSISTED LAPAROSCOPIC RADICAL PROSTATECTOMY LEVEL 2;  Surgeon: Renda Glance, MD;  Location: [facility];  Service: Urology;  Laterality: N/A;  210 MINUTES NEEDED FOR CASE   UPPER GASTROINTESTINAL ENDOSCOPY     VENTRAL HERNIA REPAIR N/A 10/05/2023   Procedure: LAPAROSCOPIC VENTRAL HERNIA REPAAIR WITH MESH;  Surgeon: Rubin Calamity, MD;  Location: [facility];  Service:  General;  Laterality: N/A;   Patient Active Problem List   Diagnosis Date Noted   Malnutrition of moderate degree 08/16/2024   Diarrhea 08/15/2024   Antineoplastic chemotherapy induced pancytopenia 08/15/2024   HCAP (healthcare-associated pneumonia) 08/14/2024   Sepsis (HCC) 08/11/2024   Protein-calorie malnutrition, severe 08/09/2024   Sepsis due to undetermined organism (HCC) 08/07/2024   Neutropenia with fever 08/07/2024   Anemia 08/07/2024   Hyponatremia 08/07/2024   Moderate protein malnutrition 08/07/2024   Transaminitis 08/07/2024   Oral thrush 08/07/2024   Multifocal pneumonia 08/07/2024   Sinus tachycardia 08/07/2024   Hypomagnesemia 08/07/2024   Hypophosphatemia 08/07/2024   Neutropenia 08/07/2024   Leukopenia due to antineoplastic chemotherapy 07/26/2024   Cancer related pain 06/08/2024   Oropharyngeal cancer (HCC) 06/07/2024   Neck mass 04/23/2024   Genetic testing 04/23/2023   Malignant neoplasm of prostate (HCC) 02/24/2023   Dysphonia 11/25/2022   Coronary artery disease involving native coronary artery of native heart without angina pectoris    DOE (dyspnea on exertion)    Speech Therapy Progress Note  Dates of Reporting Period: 06/22/24 to present  Subjective Statement: Pt has been seen for 3 sessions for swallowing in light of head and neck cancer post ChRT.  Objective: Pt is beginning to consume solid POs and procedure with HEP is WNL.  Goal Update: See below.  Plan: See below  Reason Skilled Services are Required: Pt cont to require ST to assess safety with diet, accuracy with HEP.   ONSET DATE: See pertinent history below   REFERRING DIAG:  C10.9 (ICD-10-CM) - Primary squamous cell carcinoma of oropharynx (HCC)    THERAPY DIAG:  Dysphagia, unspecified type  Voice hoarseness  Rationale for Evaluation and Treatment: Rehabilitation  SUBJECTIVE:   SUBJECTIVE STATEMENT: I had split pea soup the other night.  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 Hx: He presented to his PCP with complaints of chronic pharyngitis and a left submandibular mass that has persisted over the past year. CT scans were ordered. 04/06/24 CT neck/chest showed a 3.0 x 2.4 x 2.5 cm soft tissue mass centered in the left oropharynx at the glossotonsillar sulcus with possible involvement of the adjacent tongue base. Scans also noted scattered tiny pulmonary nodules are unchanged and likely benign but with only 5 months of documented stability but with recommend attention on follow-up given his history of prostate cancer. 04/24/24 He saw Dr. Graig who performed a FNA biopsy of left neck. However, biopsy was inconclusive. As a result, he then underwent a laryngoscopy with biopsy on 05/15/24 under the care of Dr. Lauralee. 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 Tumor is moderately differentiated with foci of poor differentiation and invasion into the striated muscle. Immunohistochemical studies are p16, p40, and HRHPV are positive. 05/09/24 MRI cervical spine revealing no evidence of metastatic disease. 04/26/24 PET showing FDG avid mass along the left tongue base/oropharynx with left cervical chain nodal metastatic disease with no suspicious pulmonary or mediastinal FDG uptake  Consult with Dr. Izell and Dr. Autumn 06/07/24. He will receive chemotherapy and radiation. Treatment plan: He will receive 35 fractions of radiation to his Oropharynx and bilateral neck with weekly chemotherapy. He will start on 06/27/24 and complete 08/14/24. Pretreatment procedures:06/23/24 PEG/PAC  MBS 08/09/24: Clinical Impression: Patient presents with mild oropharyngeal dysphagia likely due to his oropharyngeal mass, iatrogenic effects of XRT and his bilateral vocal fold atrophy. These combined issues with edema result in decreased airway closure allowing trace  aspiration of thin liquid with nonproductive cough response. Aspiration noted most especially occuring early in the study - as barium spilled into open airway prior to swallow being triggered. SLP questions some component of warm up effect. Further episodes of laryngeal penetration were inconsistent and only trace. Mild retention noted in pharynx (more at tongue base with solids) post-swallow due to decreased tongue base retraction and inadequate mastication. Pt c/o severe xerostomia causing foods to adhere to his oral structures No aspiration or penetration with cracker, puree or honey thick liquids. Pharyngeal clearance with puree was excellent, likely due to combined weight of bolus allowing improved neuro input. Various postures were not helpful to mitigate retention or aspiration - including chin tuck *tucked approx 20* due to neck stiffness* and head turn left. Liquid swallows are more effective than dry swallows which are not really functional. At this time, recommend pt be able to start po diet with strict precautions. If pt is reflexively coughing during intake, it is likely due to aspiration. Given he has PEG tube for nutrition, recommend close monitoring of po diet via temperatures, lung sound monitoring, etc. Expect swallow to improve several week after XRT is completed.   PAIN:  Are you having pain? No  FALLS: Has patient  fallen in last 6 months?  No   PATIENT GOALS: Have normal swallowing  OBJECTIVE:  Note: Objective measures were completed at Evaluation unless otherwise noted.                                                                                                                            TREATMENT DATE:   10/11/24: Pt having PEG removed Friday. Eating soft and soupy foods, some minimal puree. Today pt req'd min A with Masako and Mendelsohn. He has not been compliant with HEP frequency or recommended rep #. Today he ate cereal bar, applesauce, and drank water  without any OVERT  pharyngeal difficulties noted, and xerostomia caused increased oral residue on lower teeth. Lung CT results from 10/02/24 included: Assessment & Plan Recurrent pneumonia New left upper lung inflammation likely from aspiration or community-acquired pneumonia. Right lung inflammation improved. Shortness of breath due to deconditioning and residual inflammation. Increased activity level noted. - Prescribed Augmentin  for potential pneumonia during travel. - Prescribed prednisone  with tapering dose for potential exacerbation during travel. - Advised to monitor for pneumonia symptoms and contact clinic if symptoms arise. - Encouraged continued physical activity to improve conditioning. SLP encouraged pt to keep 10/24/24 MBS.  09/06/24: Pt had hospitalization for PNA in November, bacterial. Had MBS which Id'd trace aspiration. Results above in pertinent history. Today pt ate 1/3 teaspoons applesauce and drank water  with persistent pharyngeal residue despite multiple sips thin afterwards. Pt has had tastes of creamed/homogenous soups, apple juice, prune juice, and water . He is most comfortable today not pushing solid foods and focusing on water  intake. SLP told pt to drink 8 oz H2O TID after thorough oral care and cont to sip on water  throughout the day. Pt also agreed he would like another MBS, when given the option by SLP.  He has been completing HEP minimally in the last 4 weeks but was more consistent with Shaker (could do 30 second hold instead of 45-seconds). SLP assessed pt's accuracy with HEP and he req'd min A for tongue protrusion with Masako. SLP strongly encouraged as pt cont to heal he should incr reps up to 25-30 over the next 2-3 weeks as he cont to heal. He provided rationale for HEP, and was provided overt s/sx aspiration PNA which he demonstrated understanding.   07/27/24: Pt having milkshakes, pudding, and Boost VHC for nutrition. Today with applesauce and water , pt did not exhibit oral  deficits, nor overt s/sx of pharyngeal deficits, with small bites of applesauce. SLP shared about food journal with pt and he expressed he may or may not use the food journal depending on how his recovery is going. Right now cannot watch people eat. With HEP procedure, pt was mod independent. Asked if one exercise was recommended which one would it be and SLP shared this was challenging as exercises target certain muscle groups. Pt has performed HEP at suboptimal frequency however at this point in the tx schedule this can be  expected. SLP reminded pt about cycling through the exercises at this point in ChRT schedule.   06/22/24: Research states the risk for dysphagia increases due to radiation and/or chemotherapy treatment due to a variety of factors, so SLP educated the pt about the possibility of reduced/limited ability for PO intake during rad tx. SLP also educated pt regarding possible changes to swallowing musculature after rad tx, and why adherence to dysphagia HEP provided today and PO consumption was necessary to inhibit muscle fibrosis following rad tx and to mitigate muscle disuse atrophy. SLP informed pt why this would be detrimental to their swallowing status and to their pulmonary health. Pt demonstrated understanding of these things to SLP. SLP encouraged pt to safely eat and drink as deep into their radiation/chemotherapy as possible to provide the best possible long-term swallowing outcome for pt.    SLP then developed an individualized HEP for pt involving oral and pharyngeal strengthening and ROM and pt was instructed how to perform these exercises, including SLP demonstration. After SLP demonstration, pt return demonstrated each exercise. SLP ensured pt performance was correct prior to educating pt on next exercise. Pt required usual min-mod cues faded to modified independent to perform HEP. Pt was instructed to complete this program 6-7 days/week, at least 2 times a day until 6 months after  his or her last day of rad tx, and then x2 a week after that, indefinitely. Among other modifications for days when pt cannot functionally swallow, SLP also suggested pt to perform only non-swallowing tasks on the handout/HEP, and if necessary to cycle through the swallowing portion so the full program of exercises can be completed instead of fatiguing on one of the swallowing exercises and being unable to perform the other swallowing exercises. SLP instructed that swallowing exercises should then be added back into the regimen as pt is able to do so.   PATIENT EDUCATION: Education details: late effects head/neck radiation on swallow function, HEP procedure, and modification to HEP when difficulty experienced with swallowing during and after radiation course Person educated: Patient Education method: Explanation, Demonstration, Verbal cues, and Handouts Education comprehension: verbalized understanding, returned demonstration, verbal cues required, and needs further education   ASSESSMENT:  CLINICAL IMPRESSION: Patient is a 74 y.o. M who was seen today for treatment of swallowing as they undergo radiation/chemoradiation therapy. Today pt ate applesauce, two bites of cereal bar, and drank thin liquids with no overt s/sx oropharyngeal dysphagia except xerostomia affecting pt's oral clearance from lower teeth. With pt's hx of aspiration PNA, and a new possibility of PNA from aspiration on latest lung CT, a MBS is recommended to be completed 10/24/24, to assess a safe diet for pt post ChRT.  At this time pt swallowing is deemed [facility] with liquids and puree. There are no OVERT s/s aspiration PNA observed by SLP today however pt lung CT from 10/02/24 is not completely ruling out the possibility of PNA due to aspiration or a virus. Data indicate that pt's swallow ability will likely decline over time following the conclusion of that therapy due to muscle disuse atrophy and/or muscle fibrosis. Pt will cont to need to  be seen by SLP in order to assess safety of PO intake, assess the need for recommending any objective swallow assessment, and ensuring pt is correctly completing the individualized HEP.  OBJECTIVE IMPAIRMENTS: include dysphagia. These impairments are limiting patient from safety when swallowing. Factors affecting potential to achieve goals and functional outcome are none noted today. Patient will benefit from skilled  SLP services to address above impairments and improve overall function.   REHAB POTENTIAL: Good     GOALS: Goals reviewed with patient? No   SHORT TERM GOALS: Target: 3rd total session   1. Pt will complete HEP with modified independence in 2 sessions Baseline: 07/27/24 Goal status: met   2.  pt will tell SLP why pt is completing HEP with modified independence Baseline:  Goal status: met   3.  pt will describe 3 overt s/s aspiration PNA with modified independence Baseline:  Goal status: met   4.  pt will tell SLP how a food journal could hasten return to a more normalized diet Baseline:  Goal status: met     LONG TERM GOALS: Target: 7th total session   1.  pt will complete HEP with independence over two visits Baseline:  Goal status: INITIAL   2.  pt will describe how to modify HEP over time, and the timeline associated with reduction in HEP frequency with modified independence over two sessions Baseline:  Goal status: INITIAL     PLAN:   SLP FREQUENCY:  once approx every 4 weeks   SLP DURATION:  7 sessions   PLANNED INTERVENTIONS: Aspiration precaution training, Pharyngeal strengthening exercises, Diet toleration management , Trials of upgraded texture/liquids, SLP instruction and feedback, Compensatory strategies, and Patient/family education, [phone] (treatment of swallowing dysfunction and/or oral function for feeding)   Jemmie Ledgerwood, CCC-SLP 10/11/2024, 2:09 PM      "

## 2024-10-11 NOTE — Telephone Encounter (Signed)
 Open in error

## 2024-10-13 ENCOUNTER — Ambulatory Visit (HOSPITAL_COMMUNITY)
Admission: RE | Admit: 2024-10-13 | Discharge: 2024-10-13 | Disposition: A | Source: Ambulatory Visit | Attending: Oncology

## 2024-10-13 DIAGNOSIS — Z431 Encounter for attention to gastrostomy: Secondary | ICD-10-CM | POA: Diagnosis present

## 2024-10-13 DIAGNOSIS — C109 Malignant neoplasm of oropharynx, unspecified: Secondary | ICD-10-CM

## 2024-10-17 ENCOUNTER — Ambulatory Visit: Admitting: Physical Therapy

## 2024-10-18 ENCOUNTER — Other Ambulatory Visit

## 2024-10-19 ENCOUNTER — Encounter: Payer: Self-pay | Admitting: Oncology

## 2024-10-19 ENCOUNTER — Inpatient Hospital Stay: Admitting: Oncology

## 2024-10-19 ENCOUNTER — Inpatient Hospital Stay

## 2024-10-19 VITALS — BP 109/66 | HR 107 | Temp 97.7°F | Resp 18 | Ht 69.0 in | Wt 172.4 lb

## 2024-10-19 DIAGNOSIS — C109 Malignant neoplasm of oropharynx, unspecified: Secondary | ICD-10-CM

## 2024-10-19 DIAGNOSIS — G893 Neoplasm related pain (acute) (chronic): Secondary | ICD-10-CM | POA: Diagnosis not present

## 2024-10-19 DIAGNOSIS — D649 Anemia, unspecified: Secondary | ICD-10-CM

## 2024-10-19 LAB — CMP (CANCER CENTER ONLY)
ALT: 25 U/L (ref 0–44)
AST: 28 U/L (ref 15–41)
Albumin: 4.2 g/dL (ref 3.5–5.0)
Alkaline Phosphatase: 89 U/L (ref 38–126)
Anion gap: 12 (ref 5–15)
BUN: 18 mg/dL (ref 8–23)
CO2: 24 mmol/L (ref 22–32)
Calcium: 9.9 mg/dL (ref 8.9–10.3)
Chloride: 102 mmol/L (ref 98–111)
Creatinine: 0.96 mg/dL (ref 0.61–1.24)
GFR, Estimated: >60 mL/min
Glucose, Bld: 137 mg/dL — ABNORMAL HIGH (ref 70–99)
Potassium: 3.6 mmol/L (ref 3.5–5.1)
Sodium: 138 mmol/L (ref 135–145)
Total Bilirubin: 0.5 mg/dL (ref 0.0–1.2)
Total Protein: 7.3 g/dL (ref 6.5–8.1)

## 2024-10-19 LAB — CBC WITH DIFFERENTIAL (CANCER CENTER ONLY)
Abs Immature Granulocytes: 0.02 10*3/uL (ref 0.00–0.07)
Basophils Absolute: 0 10*3/uL (ref 0.0–0.1)
Basophils Relative: 0 %
Eosinophils Absolute: 0 10*3/uL (ref 0.0–0.5)
Eosinophils Relative: 1 %
HCT: 34.2 % — ABNORMAL LOW (ref 39.0–52.0)
Hemoglobin: 11.7 g/dL — ABNORMAL LOW (ref 13.0–17.0)
Immature Granulocytes: 0 %
Lymphocytes Relative: 17 %
Lymphs Abs: 1 10*3/uL (ref 0.7–4.0)
MCH: 30 pg (ref 26.0–34.0)
MCHC: 34.2 g/dL (ref 30.0–36.0)
MCV: 87.7 fL (ref 80.0–100.0)
Monocytes Absolute: 0.3 10*3/uL (ref 0.1–1.0)
Monocytes Relative: 6 %
Neutro Abs: 4.5 10*3/uL (ref 1.7–7.7)
Neutrophils Relative %: 76 %
Platelet Count: 246 10*3/uL (ref 150–400)
RBC: 3.9 MIL/uL — ABNORMAL LOW (ref 4.22–5.81)
RDW: 16 % — ABNORMAL HIGH (ref 11.5–15.5)
WBC Count: 5.9 10*3/uL (ref 4.0–10.5)
nRBC: 0 % (ref 0.0–0.2)

## 2024-10-19 LAB — MAGNESIUM: Magnesium: 2.1 mg/dL (ref 1.7–2.4)

## 2024-10-19 MED ORDER — HYDROMORPHONE HCL 2 MG PO TABS: 2.0000 mg | ORAL_TABLET | Freq: Four times a day (QID) | ORAL | 0 refills | Status: AC | PRN

## 2024-10-19 NOTE — Assessment & Plan Note (Signed)
 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   CT scan from July 2025 showed lymph nodes with extranodal extension, indicating a higher risk of recurrence without appropriate treatment. The cancer is aggressive due to lymph node involvement but has not spread beyond the head and neck area.   cT2,cN1,cM0,p16+, Stage 1 disease.  Concern for extranodal extension based on imaging.  Previously I discussed staging, prognosis, plan of care, treatment options.  Reviewed NCCN guidelines.  Started concurrent chemoradiation with weekly cisplatin  from 06/27/2024.  Creatinine went up to 1.44 on 07/12/2024.  Dose reduced cisplatin  by 50% with cycle 3 as a result.  With normalization of creatinine, cisplatin  was resumed at standard dose from cycle 4 onwards.  Cycle 5 of chemotherapy was given on 07/26/2024.  Further chemotherapy held because of neutropenia.   He was hospitalized from 08/08/2023 until 08/12/2024 for sepsis secondary to bilateral aspiration pneumonia.   Completed radiation treatments on 08/15/2024.  He is recovering well from chemoradiation related side effects.  No mucositis currently.  Has some voice hoarseness still.  Has generalized fatigue which is slowly improving ever since his recent sepsis episode.  - Repeat CT scan of neck and chest in late February 2026 prior to next radiation oncology appointment. - Alternating follow-up visits with medical and radiation oncologists every 6 weeks to 3 months.  I will plan to see him approximately 3 months for follow-up.

## 2024-10-19 NOTE — Progress Notes (Signed)
 "  [facility]  ONCOLOGY CLINIC PROGRESS NOTE   Patient Care Team: Windy Coy, MD as PCP - General (Family Medicine) Pietro, Redell RAMAN, MD as PCP - Cardiology (Cardiology) Vertell Pont, RN as Oncology Nurse Navigator Renda Glance, MD as Consulting Physician (Urology) Patrcia Cough, MD as Consulting Physician (Radiation Oncology) Starla Wendelyn BIRCH, RN as Registered Nurse Izell Domino, MD as Consulting Physician (Radiation Oncology) Autumn Millman, MD as Consulting Physician (Oncology) Lauralee Chew, MD as Referring Physician (Otolaryngology) Malmfelt, Delon CROME, RN as Oncology Nurse Navigator  PATIENT NAME: Gilbert [facility]   MR#: [id number] DOB: October 15, 1950  Date of visit: 10/19/2024   ASSESSMENT & PLAN:   Gilbert Thompson is a 74 y.o. 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  Concern for extranodal extension based on imaging.  Started concurrent chemoradiation with weekly cisplatin  from 06/27/2024.  Received 5 cycles of cisplatin , last on 07/26/2024.  Further chemotherapy held because of neutropenia.  Completed radiation treatments on 08/15/2024.  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   CT scan from July 2025 showed lymph nodes with extranodal extension, indicating a higher risk of recurrence without appropriate treatment. The cancer is aggressive due to lymph node involvement but has not spread beyond the head and neck area.   cT2,cN1,cM0,p16+, Stage 1 disease.  Concern for extranodal extension based on imaging.  Previously I discussed staging, prognosis, plan of care, treatment options.  Reviewed NCCN  guidelines.  Started concurrent chemoradiation with weekly cisplatin  from 06/27/2024.  Creatinine went up to 1.44 on 07/12/2024.  Dose reduced cisplatin  by 50% with cycle 3 as a result.  With normalization of creatinine, cisplatin  was resumed at standard dose from cycle 4 onwards.  Cycle 5 of chemotherapy was given on 07/26/2024.  Further chemotherapy held because of neutropenia.   He was hospitalized from 08/08/2023 until 08/12/2024 for sepsis secondary to bilateral aspiration pneumonia.   Completed radiation treatments on 08/15/2024.  He is recovering well from chemoradiation related side effects.  No mucositis currently.  Has some voice hoarseness still.  Has generalized fatigue which is slowly improving ever since his recent sepsis episode.  - Repeat CT scan of neck and chest in late February 2026 prior to next radiation oncology appointment. - Alternating follow-up visits with medical and radiation oncologists every 6 weeks to 3 months.  I will plan to see him approximately 3 months for follow-up.  Cancer related pain Chronic pain, currently managed with intermittent hydromorphone . - Prescribed 60 tablets of hydromorphone  for breakthrough pain. - Reviewed pain management regimen and confirmed ongoing need for short-acting opioid.  Protein-calorie malnutrition Protein-calorie malnutrition. Feeding tube recently removed. He is now able to swallow pills and is working on oral intake, though appetite remains variable. - Confirmed feeding tube removal.  Normocytic anemia Mild normocytic anemia with hemoglobin of 11.7 g/dL, improved from prior values. Blood counts otherwise stable. - Monitored hemoglobin and blood counts. - Planned to recheck blood counts at next visit.  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 Imaging shows improved lung aeration but persistent inflammation and possible scarring, with a new spot in  the left upper lung. He reports ongoing generalized soreness, likely post-infectious sequelae. No  current fever or acute symptoms. Cleared for planned international travel given current stability. - Reviewed recent CT scans and discussed findings of residual scarring and new left upper lung spot. - Planned repeat CT scan of the chest in late February for ongoing surveillance. - Advised to monitor for fever or new symptoms, especially prior to travel. - Cleared for one-week international travel in February, provided he remains afebrile and stable.  Dysphagia and oral mucosal dryness Chronic dysphagia and oral mucosal dryness, both improving. He is now able to swallow pills and oral mucosa has healed. Dry mouth persists but is manageable. Near completion of speech and swallowing therapy. - Assessed oral cavity and confirmed healing with no evidence of thrush or mucositis. - Confirmed near completion of speech and swallowing therapy.  I reviewed lab results and outside records for this visit and discussed relevant results with the patient. Diagnosis, plan of care and treatment options were also discussed in detail with the patient. Opportunity provided to ask questions and answers provided to his apparent satisfaction. Provided instructions to call our clinic with any problems, questions or concerns prior to return visit. I recommended to continue follow-up with PCP and sub-specialists. He verbalized understanding and agreed with the plan.   NCCN guidelines have been consulted in the planning of this patients care.  I spent a total of 42 minutes during this encounter with the patient including review of chart and various tests results, discussions about plan of care and coordination of care plan.   [name], MD  10/19/2024 1:00 PM  [facility] - A DEPT OF 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 cT2,cN1,cM0,p16+, Stage 1 disease.  Concern for extranodal extension based on imaging.    Current Treatment:  Started concurrent chemoradiation with weekly cisplatin  from 06/27/2024. Cycle 5 of cisplatin  given on 07/26/24. Further chemo held because of neutropenia and hospitalization with pneumonia.   INTERVAL HISTORY:    Discussed the use of AI scribe software for clinical note transcription with the patient, who gave verbal consent to proceed.  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  He is in the post-treatment surveillance phase following completion of concurrent chemoradiation (5 cycles cisplatin , completed November 2025) for oropharyngeal squamous cell carcinoma. Chemotherapy was discontinued after five cycles due to neutropenia and hospitalization for sepsis; radiation was completed as planned.  He continues to experience persistent hoarseness and xerostomia, though his voice is gradually improving. He is now able to swallow pills and is nearing completion of speech and swallowing therapy. The feeding tube was removed the Friday prior to this visit, and the port remains in place for ongoing monitoring. He continues to have poor oral intake and variable appetite, with intermittent mild nausea managed with Compazine . He describes fluctuating energy levels, with some days of increased fatigue and need for sleep.  He has ongoing cancer-related pain, typically requiring one to two doses of hydromorphone  daily, especially for late afternoon pain episodes. Pain is otherwise reasonably controlled. He denies fevers and has not experienced new infectious symptoms.  He was recently hospitalized  for multifocal pneumonia and sepsis. Follow-up imaging on September 28, 2024, demonstrated improved lung  aeration compared to November 2025, but persistent inflammation and possible scarring remain, with a new spot identified in the left upper lung. He is concerned about lingering effects of sepsis, specifically persistent soreness in his legs, hands, and feet without a clear etiology. He is aware of ongoing evaluation for possible atypical infection and is scheduled for a follow-up CT scan of the neck and chest in late February 2026. He is considering international travel for one week in mid-February and sought guidance regarding the safety of travel given his recent illness and ongoing recovery.  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 Chemotherapy stopped after cycle 5 due to neutropenia and hospitalization for sepsis; post-treatment pneumonia and C. difficile infection improving. Persistent dysphagia and oral mucosal dryness requiring tube feedings; pain managed with extended-release morphine  and hydromorphone . PET scan planned to assess treatment response.   I have reviewed the past medical history, past surgical history, social history and family history with the patient and they are unchanged from previous note.  HISTORY OF PRESENT ILLNESS:   ONCOLOGY HISTORY:   He presented to his PCP with complains of chronic pharyngitis and a left submandibular mass that has persisted over the past year.   To further investigate his symptoms, he underwent a CT neck and CT chest on 04/06/24 showing a 3.0 x 2.4 x 2.5 cm soft tissue mass centered in the left oropharynx at the glossotonsillar sulcus with possible involvement of the adjacent tongue base. 7 mm low-attenuation left level IB lymph node likely reflecting a site of nodal metastatic disease. 1.7 x 1.7 cm irregular soft tissue focus  at the left level II station, inseparable from the adjacent sternocleidomastoid muscle. This is suspicious for nodal metastatic disease (with findings concerning for extracapsular extension) or a tumor deposit. 9 mm short-axis low attenuation left level III lymph node likely reflecting a site of nodal metastatic disease. Scans also noted scattered tiny pulmonary nodules are unchanged and likely benign but with only 5 months of documented stability but with recommend attention on follow-up given his history of prostate cancer.    Subsequently, the patient was referred to Dr. Graig on 04/24/24 who performed a FNA biopsy of left neck. However, biopsy was inconclusive. As a result, he then underwent a laryngoscopy with biopsy on 05/15/24 under the care of Dr. Lauralee. 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 Tumor is moderately differentiated with foci of poor differentiation and invasion into the striated muscle. Immunohistochemical studies are p16, p40, and HRHPV are positive.    He had a cervical spine MRI performed on 05/09/24 revealing no evidence of metastatic disease to the cervical spine itself but did indicated degenerative spondylosis at C4-5 and C5-6. 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 PET scan performed on 04/26/24 showing FDG avid mass along the left tongue base/oropharynx with left cervical chain nodal metastatic disease with no suspicious pulmonary or mediastinal FDG uptake.   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    Prior cancers, if any: history of prostate cancer.   Given extracapsular extension with lymph node positive disease, plan made to proceed with concurrent chemoradiation.   Plan made for weekly cisplatin  with radiation, if renal function allows.  Otherwise we will use either docetaxel or  carboplatin/paclitaxel regimen.  Started concurrent chemoradiation with weekly cisplatin  from 06/27/2024.  Creatinine went up to 1.44 on 07/12/2024.  Dose reduced cisplatin  by 50% with cycle 3 as a result.  With normalization of creatinine, cisplatin  was resumed at standard dose from cycle 4 onwards.  Cycle 5 of chemotherapy was given on 07/26/2024.  Further chemotherapy held because of neutropenia.   He was hospitalized from 08/08/2023 until 08/12/2024 for sepsis secondary to bilateral aspiration pneumonia.   Completed radiation treatments on 08/15/2024.  Remains on surveillance as per NCCN guidelines.  Oncology History  Malignant neoplasm of prostate (HCC)  01/19/2023 Cancer Staging   Staging form: Prostate, AJCC 8th Edition - Clinical stage from 01/19/2023: Stage IIC (cT2a, cN0, cM0, PSA: 6, Grade Group: 4) - Signed by Sherwood Rise, PA-C on 03/30/2023 Histopathologic type: Adenocarcinoma, NOS Stage prefix: Initial diagnosis Prostate specific antigen (PSA) range: Less than 10 Gleason primary pattern: 4 Gleason secondary pattern: 4 Gleason score: 8 Histologic grading system: 5 grade system Number of biopsy cores examined: 14 Number of biopsy cores positive: 5 Location of positive needle core biopsies: One side   02/24/2023 Initial Diagnosis   Malignant neoplasm of prostate (HCC)   Oropharyngeal cancer (HCC)  06/07/2024 Initial Diagnosis   Malignant neoplasm of base of tongue (HCC)   06/07/2024 Cancer Staging   Staging form: Pharynx - HPV-Mediated Oropharynx, AJCC 8th Edition - Clinical stage from 06/07/2024: Stage I (cT2, cN1, cM0, p16+) - Signed by Izell Domino, MD on 06/07/2024 Stage prefix: Initial diagnosis   06/28/2024 -  Chemotherapy   Patient is on Treatment Plan : HEAD/NECK Cisplatin  (40) q7d         REVIEW OF SYSTEMS:   Review of Systems - Oncology  All other pertinent systems were reviewed with the patient and are negative.  ALLERGIES: He has no known  allergies.  MEDICATIONS:  Current Outpatient Medications  Medication Sig Dispense Refill   acetaminophen  (TYLENOL ) 325 MG tablet Take 2 tablets (650 mg total) by mouth every 6 (six) hours as needed for mild pain (pain score 1-3) or fever (or Fever >/= 101).     albuterol  (VENTOLIN  HFA) 108 (90 Base) MCG/ACT inhaler Inhale 1-2 puffs into the lungs 3 (three) times daily for 5 days, THEN 1-2 puffs every 4 (four) hours as needed for wheezing or shortness of breath. 6.7 g 0   allopurinol  (ZYLOPRIM ) 300 MG tablet Place 300 mg into feeding tube daily.     ALPRAZolam  (XANAX ) 1 MG tablet Place 1 mg into feeding tube daily as needed for anxiety or sleep.     amoxicillin -clavulanate (AUGMENTIN ) 875-125 MG tablet Take 1 tablet by mouth 2 (two) times daily. 14 tablet 0   antiseptic oral rinse (CORINZ) solution 7 mLs by Mouth Rinse route as needed (for a dry mouth).     aspirin  EC 81 MG tablet Take 81 mg by mouth daily. Swallow whole.     celecoxib (CELEBREX) 200 MG capsule Take 200 mg by mouth daily.     finasteride  (PROSCAR ) 5 MG tablet 5 mg See admin instructions. 5 mg per tube once a day     Fluticasone -Umeclidin-Vilant (TRELEGY ELLIPTA) 100-62.5-25 MCG/ACT AEPB Inhale 1 puff into the lungs daily.     lidocaine  (XYLOCAINE ) 2 % solution Patient: Mix 1part 2% viscous lidocaine , 1part H20. Swish & swallow 10mL of diluted mixture, before meals and at bedtime, up to QID 200 mL 3   loperamide  HCl (IMODIUM ) 1 MG/7.5ML suspension Place 15 mLs (2 mg total) into  feeding tube as needed for diarrhea or loose stools. 120 mL 0   losartan  (COZAAR ) 50 MG tablet Take 50 mg by mouth daily.     magic mouthwash (nystatin , lidocaine , diphenhydrAMINE , alum & mag hydroxide) suspension Swish and swallow 5 mLs by mouth 4 (four) times daily as needed for mouth pain. 140 mL 2   morphine  (MS CONTIN ) 15 MG 12 hr tablet Take 1 tablet (15 mg total) by mouth every 12 (twelve) hours. 60 tablet 0   nitroGLYCERIN  (NITROSTAT ) 0.4 MG SL  tablet Place 1 tablet (0.4 mg total) under the tongue every 5 (five) minutes as needed for chest pain. 25 tablet 3   ondansetron  (ZOFRAN ) 8 MG tablet Take 1 tablet (8 mg total) by mouth every 8 (eight) hours as needed for nausea or vomiting. Start on the third day after cisplatin . 30 tablet 1   polyethylene glycol (MIRALAX  / GLYCOLAX ) 17 g packet Place 17 g into feeding tube daily as needed for moderate constipation.     predniSONE  (DELTASONE ) 10 MG tablet Take 2 tablets (20 mg total) by mouth daily with breakfast. 10 tablet 0   prochlorperazine  (COMPAZINE ) 10 MG tablet Take 1 tablet (10 mg total) by mouth every 6 (six) hours as needed (Nausea or vomiting). 30 tablet 1   rosuvastatin  (CRESTOR ) 40 MG tablet Take 1 tablet (40 mg total) by mouth daily. 90 tablet 3   [Paused] ezetimibe  (ZETIA ) 10 MG tablet TAKE 1 TABLET BY MOUTH DAILY 90 tablet 3   fluticasone  (FLONASE ) 50 MCG/ACT nasal spray Place 2 sprays into both nostrils daily for 7 days. 11.1 mL 0   HYDROmorphone  (DILAUDID ) 2 MG tablet Take 1 tablet (2 mg total) by mouth every 6 (six) hours as needed for severe pain (pain score 7-10). 60 tablet 0   No current facility-administered medications for this visit.     VITALS:   Blood pressure 109/66, pulse (!) 107, temperature 97.7 F (36.5 C), temperature source Temporal, resp. rate 18, height 5' 9 (1.753 m), weight 172 lb 6.4 oz (78.2 kg), SpO2 100%.  Wt Readings from Last 3 Encounters:  10/19/24 172 lb 6.4 oz (78.2 kg)  10/02/24 171 lb 6.4 oz (77.7 kg)  09/19/24 172 lb (78 kg)    Body mass index is 25.46 kg/m.   Onc Performance Status - 10/19/24 1200       ECOG Perf Status   ECOG Perf Status Restricted in physically strenuous activity but ambulatory and able to carry out work of a light or sedentary nature, e.g., light house work, office work      KPS SCALE   KPS % SCORE Normal activity with effort, some s/s of disease            PHYSICAL EXAM:   Physical  Exam Constitutional:      General: He is not in acute distress.    Appearance: Normal appearance.  HENT:     Head: Normocephalic and atraumatic.     Mouth/Throat:     Comments: Mucositis has resolved.  No obvious abnormalities noted in the visualized portion of oral cavity. Eyes:     Conjunctiva/sclera: Conjunctivae normal.  Cardiovascular:     Rate and Rhythm: Normal rate and regular rhythm.  Pulmonary:     Effort: Pulmonary effort is normal. No respiratory distress.  Chest:     Comments: Right-sided Port-A-Cath in place without any signs of infection Abdominal:     General: There is no distension.  Neurological:     General:  No focal deficit present.     Mental Status: He is alert and oriented to person, place, and time.  Psychiatric:        Mood and Affect: Mood normal.        Behavior: Behavior normal.      LABORATORY DATA:   I have reviewed the data as listed.  Results for orders placed or performed in visit on 10/19/24  Magnesium   Result Value Ref Range   Magnesium  2.1 1.7 - 2.4 mg/dL  CMP ([facility] only)  Result Value Ref Range   Sodium 138 135 - 145 mmol/L   Potassium 3.6 3.5 - 5.1 mmol/L   Chloride 102 98 - 111 mmol/L   CO2 24 22 - 32 mmol/L   Glucose, Bld 137 (H) 70 - 99 mg/dL   BUN 18 8 - 23 mg/dL   Creatinine 9.03 9.38 - 1.24 mg/dL   Calcium  9.9 8.9 - 10.3 mg/dL   Total Protein 7.3 6.5 - 8.1 g/dL   Albumin 4.2 3.5 - 5.0 g/dL   AST 28 15 - 41 U/L   ALT 25 0 - 44 U/L   Alkaline Phosphatase 89 38 - 126 U/L   Total Bilirubin 0.5 0.0 - 1.2 mg/dL   GFR, Estimated >39 >39 mL/min   Anion gap 12 5 - 15  CBC with Differential ([facility] Only)  Result Value Ref Range   WBC Count 5.9 4.0 - 10.5 K/uL   RBC 3.90 (L) 4.22 - 5.81 MIL/uL   Hemoglobin 11.7 (L) 13.0 - 17.0 g/dL   HCT 65.7 (L) 60.9 - 47.9 %   MCV 87.7 80.0 - 100.0 fL   MCH 30.0 26.0 - 34.0 pg   MCHC 34.2 30.0 - 36.0 g/dL   RDW 83.9 (H) 88.4 - 84.4 %   Platelet Count 246 150 - 400 K/uL    nRBC 0.0 0.0 - 0.2 %   Neutrophils Relative % 76 %   Neutro Abs 4.5 1.7 - 7.7 K/uL   Lymphocytes Relative 17 %   Lymphs Abs 1.0 0.7 - 4.0 K/uL   Monocytes Relative 6 %   Monocytes Absolute 0.3 0.1 - 1.0 K/uL   Eosinophils Relative 1 %   Eosinophils Absolute 0.0 0.0 - 0.5 K/uL   Basophils Relative 0 %   Basophils Absolute 0.0 0.0 - 0.1 K/uL   Immature Granulocytes 0 %   Abs Immature Granulocytes 0.02 0.00 - 0.07 K/uL      RADIOGRAPHIC STUDIES:  No recent pertinent imaging available to review.   CODE STATUS:  Code Status History     Date Active Date Inactive Code Status Order ID Comments User Context   06/23/2024 1257 06/24/2024 0510 Full Code [id number]  Hughes Simmonds, MD HOV   06/23/2024 1257 06/23/2024 1257 Full Code [id number]  Hughes Simmonds, MD HOV   04/29/2023 1830 04/30/2023 1811 Full Code [id number]  Renda Glance, MD Inpatient   06/05/2022 0846 06/05/2022 1634 Full Code [id number]  Claudene Victory ORN, MD Inpatient    Questions for Most Recent Historical Code Status (Order [id number])     Question Answer   By: Consent: discussion documented in EHR            Orders Placed This Encounter  Procedures   CBC with Differential ([facility] Only)    Standing Status:   Future    Expiration Date:   10/19/2025   Iron and Iron Binding Capacity ([facility] only)    Standing Status:   Future  Expiration Date:   10/19/2025   Ferritin    Standing Status:   Future    Expiration Date:   10/19/2025   CMP ([facility] only)    Standing Status:   Future    Expiration Date:   10/19/2025   TSH    Standing Status:   Future    Expiration Date:   10/19/2025     Future Appointments  Date Time Provider Department [facility]  10/24/2024  1:00 PM [facility] SPEECH THERAPIST [facility] [place]  10/24/2024  1:00 PM [facility] R/F 1 [facility] [place]  11/01/2024  1:15 PM Jacelyn Lupita NOVAK, CCC-SLP [facility]  11/21/2024  2:00 PM Wyatt Leeroy HERO, PA-C [facility] None  11/22/2024  3:15 PM Ivonne Harlene RAMAN, RD  [facility] None  12/22/2024  3:30 PM [facility] 1 [facility] [place]  01/01/2025  9:45 AM Kara Dorn NOVAK, MD [facility] [id number]  01/17/2025  9:15 AM [facility] FLUSH [facility] None  01/17/2025  9:45 AM Anahi Belmar, Chinita, MD [facility] None     This document was completed utilizing speech recognition software. Grammatical errors, random word insertions, pronoun errors, and incomplete sentences are an occasional consequence of this system due to software limitations, ambient noise, and hardware issues. Any formal questions or concerns about the content, text or information contained within the body of this dictation should be directly addressed to the provider for clarification.   "

## 2024-10-19 NOTE — Assessment & Plan Note (Signed)
 Chronic pain, currently managed with intermittent hydromorphone . - Prescribed 60 tablets of hydromorphone  for breakthrough pain. - Reviewed pain management regimen and confirmed ongoing need for short-acting opioid.

## 2024-10-20 ENCOUNTER — Other Ambulatory Visit: Payer: Self-pay

## 2024-10-22 ENCOUNTER — Other Ambulatory Visit: Payer: Self-pay

## 2024-10-24 ENCOUNTER — Ambulatory Visit (HOSPITAL_COMMUNITY)
Admission: RE | Admit: 2024-10-24 | Discharge: 2024-10-24 | Disposition: A | Source: Ambulatory Visit | Attending: Family Medicine

## 2024-10-24 ENCOUNTER — Ambulatory Visit (HOSPITAL_COMMUNITY)
Admission: RE | Admit: 2024-10-24 | Discharge: 2024-10-24 | Disposition: A | Source: Ambulatory Visit | Attending: Family Medicine | Admitting: Family Medicine

## 2024-10-24 DIAGNOSIS — R1310 Dysphagia, unspecified: Secondary | ICD-10-CM | POA: Insufficient documentation

## 2024-10-24 DIAGNOSIS — C109 Malignant neoplasm of oropharynx, unspecified: Secondary | ICD-10-CM

## 2024-10-24 DIAGNOSIS — R059 Cough, unspecified: Secondary | ICD-10-CM

## 2024-10-24 NOTE — Progress Notes (Signed)
 Modified Barium Swallow Study  Patient Details  Name: Gilbert Thompson MRN: [id number] Date of Birth: [birth year]  Today's Date: 10/24/2024  Modified Barium Swallow completed.  Full report located under Chart Review in the Imaging Section.  History of Present Illness 74 yr old seen for outpatient MBS. 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 Received PEG 06/23/24 that was removed several weeks ago. MBS 08/09/24 aspiration of thin, consistent penetration, mild tongue base residue with solids and recommended puree and thin (water ). Has received treatment at outpatient. Pt currently eating soups, cereal, recently initiated small pieces of solids (meats) and drinking thin liquids.   Clinical Impression Pt demonstrated significant improvements from prior MBS exhibiting a normal oropharyngeal swallow. Initialtion of swallow is timely, there was adequate propusion of material through pharynx and normal laryngeal mobility without penetration or aspiration when challenged with sequential sips thin. Intermittent trace and insignificant lingual residue present . 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 The barium pill stopped at the GE junction and did not pass through despite additional thin liquids and heavier puree bolus. Pt denied esophageal globus sensation. Recommend regular texture, thin liquids and stay upright 30 min after meals. He has been consuming primarily soft textures and recently initiated more solids. Encouraged him to continue increasing solid textures. No further intervention warranted. Factors that may increase risk of adverse event in presence of aspiration Noe & Lianne 2021): Reduced saliva  Swallow Evaluation Recommendations Recommendations: PO diet PO Diet Recommendation: Regular;Thin liquids (Level 0) (increase texture consistency as comfortable but safe for regular) Liquid Administration via:  Cup;Straw Medication Administration: Whole meds with liquid (cut in half) Supervision: Patient able to self-feed Swallowing strategies  : Slow rate;Small bites/sips Postural changes: Position pt fully upright for meals;Stay upright 30-60 min after meals Oral care recommendations: Oral care QID (4x/day)      Gilbert Thompson 10/24/2024,8:20 PM

## 2024-10-26 ENCOUNTER — Ambulatory Visit: Payer: Self-pay

## 2024-10-26 NOTE — Telephone Encounter (Signed)
 Dr Kara,   Please advise

## 2024-10-26 NOTE — Telephone Encounter (Signed)
 FYI Only or Action Required?: Action required by provider: clinical question for provider and update on patient condition.  Patient is followed in Pulmonology for asthma , last seen on 10/02/2024 by Kara Dorn NOVAK, MD.  Called Nurse Triage reporting Cough, Fatigue, and Generalized Body Aches.  Symptoms began several weeks ago.  Interventions attempted: Nothing.  Symptoms are: unchanged.  Triage Disposition: Call Specialist Now (overriding See Physician Within 24 Hours)  Patient/caregiver understands and will follow disposition?: Yes   Reason for Disposition  Coughing up rusty-colored (reddish-brown) sputum  Answer Assessment - Initial Assessment Questions Patient was seen in office on 1/12 by Dr. Kara and diagnosed with Pneumonia. Patient states that when he woke up this morning he cleared his throat and his sputum was a dark red/brown color and he was concerned and wanted to report this. During triage he cleared some mucus again and states that it is yellow. He also reports fatigue and body aches that he states he's been experiencing since getting sick. He has not been taking any antibiotics, but states that he does have some prescribed if needed. Office visit advised per triage for concern for pneumonia, but patient has been diagnosed with this. Patient is looking for advice on whether he should come in for office visit or take Augmentin /Prednisone  that were previously prescribed.   1. ONSET: When did the cough begin?      NA  2. SEVERITY: How bad is the cough today?      NA  3. SPUTUM: Describe the color of your sputum (e.g., none, dry cough; clear, white, yellow, green)     Dark red  4. HEMOPTYSIS: Are you coughing up any blood? If Yes, ask: How much? (e.g., flecks, streaks, tablespoons, etc.)     Yes  5. DIFFICULTY BREATHING: Are you having difficulty breathing? If Yes, ask: How bad is it? (e.g., mild, moderate, severe)      Denies SOB  6. FEVER: Do you  have a fever? If Yes, ask: What is your temperature, how was it measured, and when did it start?     Reports occasional chills, but no fever   7. CARDIAC HISTORY: Do you have any history of heart disease? (e.g., heart attack, congestive heart failure)      CAD  8. LUNG HISTORY: Do you have any history of lung disease?  (e.g., pulmonary embolus, asthma, emphysema)     asthma  9. PE RISK FACTORS: Do you have a history of blood clots? (or: recent major surgery, recent prolonged travel, bedridden)     No  10. OTHER SYMPTOMS: Do you have any other symptoms? (e.g., runny nose, wheezing, chest pain)       Fatigue, body aches  11. PREGNANCY: Is there any chance you are pregnant? When was your last menstrual period?       NA  12. TRAVEL: Have you traveled out of the country in the last month? (e.g., travel history, exposures)       No  Protocols used: Cough - Acute Productive-A-AH  Reason for Triage: pt has history of pneumonia that started in November, pt also has neck cancer - pt is coughing up red phlegm that started about 2-3 days ago, wife reports pt has very little energy, dr. kara prescribed amoxicillin  and prednisone  about a month ago, was advised to take if he had any flare ups

## 2024-10-26 NOTE — Telephone Encounter (Signed)
 I called and spoke to [place], Pts wife. DPR. Heron informed of Dr Luann note and verbalized understanding. NFN

## 2024-10-30 ENCOUNTER — Ambulatory Visit: Admitting: Pulmonary Disease

## 2024-11-01 ENCOUNTER — Ambulatory Visit: Attending: Radiation Oncology

## 2024-11-21 ENCOUNTER — Ambulatory Visit: Admitting: Radiation Oncology

## 2024-11-21 ENCOUNTER — Ambulatory Visit: Admitting: Radiology

## 2024-11-22 ENCOUNTER — Inpatient Hospital Stay: Attending: Oncology | Admitting: Dietician

## 2024-12-22 ENCOUNTER — Other Ambulatory Visit (HOSPITAL_COMMUNITY)

## 2025-01-01 ENCOUNTER — Ambulatory Visit: Admitting: Pulmonary Disease

## 2025-01-17 ENCOUNTER — Inpatient Hospital Stay: Attending: Oncology | Admitting: Oncology

## 2025-01-17 ENCOUNTER — Inpatient Hospital Stay
